# Patient Record
Sex: Female | Born: 1955 | Race: Black or African American | Hispanic: No | Marital: Single | Smoking: Never smoker
Health system: Southern US, Community
[De-identification: ages and names within clinical notes are randomized; demographics above are authoritative.]

## PROBLEM LIST (undated history)

## (undated) DIAGNOSIS — C50919 Malignant neoplasm of unspecified site of unspecified female breast: Secondary | ICD-10-CM

## (undated) DIAGNOSIS — T391X1A Poisoning by 4-Aminophenol derivatives, accidental (unintentional), initial encounter: Secondary | ICD-10-CM

## (undated) DIAGNOSIS — J329 Chronic sinusitis, unspecified: Secondary | ICD-10-CM

## (undated) HISTORY — PX: BIOPSY BREAST: PRO8

---

## 2005-01-18 ENCOUNTER — Inpatient Hospital Stay (HOSPITAL_COMMUNITY): Admission: AD | Admit: 2005-01-18 | Discharge: 2005-01-18 | Payer: Self-pay | Admitting: *Deleted

## 2007-04-14 ENCOUNTER — Encounter: Payer: Self-pay | Admitting: Endocrinology

## 2007-05-31 ENCOUNTER — Ambulatory Visit: Payer: Self-pay | Admitting: Endocrinology

## 2007-05-31 DIAGNOSIS — R519 Headache, unspecified: Secondary | ICD-10-CM | POA: Insufficient documentation

## 2007-05-31 DIAGNOSIS — F329 Major depressive disorder, single episode, unspecified: Secondary | ICD-10-CM

## 2007-05-31 DIAGNOSIS — N951 Menopausal and female climacteric states: Secondary | ICD-10-CM | POA: Insufficient documentation

## 2007-05-31 DIAGNOSIS — K219 Gastro-esophageal reflux disease without esophagitis: Secondary | ICD-10-CM

## 2007-05-31 DIAGNOSIS — E041 Nontoxic single thyroid nodule: Secondary | ICD-10-CM | POA: Insufficient documentation

## 2007-05-31 DIAGNOSIS — R51 Headache: Secondary | ICD-10-CM | POA: Insufficient documentation

## 2007-06-02 ENCOUNTER — Encounter (INDEPENDENT_AMBULATORY_CARE_PROVIDER_SITE_OTHER): Payer: Self-pay | Admitting: *Deleted

## 2015-04-23 ENCOUNTER — Other Ambulatory Visit: Payer: Self-pay

## 2015-04-23 DIAGNOSIS — Z1231 Encounter for screening mammogram for malignant neoplasm of breast: Secondary | ICD-10-CM

## 2015-09-17 ENCOUNTER — Ambulatory Visit
Admission: RE | Admit: 2015-09-17 | Discharge: 2015-09-17 | Disposition: A | Discharge status: Home / Self Care | Payer: No Typology Code available for payment source | Source: Ambulatory Visit

## 2015-09-17 DIAGNOSIS — Z1231 Encounter for screening mammogram for malignant neoplasm of breast: Secondary | ICD-10-CM

## 2015-09-18 ENCOUNTER — Other Ambulatory Visit: Payer: Self-pay | Admitting: Family Medicine

## 2015-09-18 DIAGNOSIS — R928 Other abnormal and inconclusive findings on diagnostic imaging of breast: Secondary | ICD-10-CM

## 2015-09-26 ENCOUNTER — Other Ambulatory Visit: Payer: Self-pay | Admitting: Family Medicine

## 2015-09-26 ENCOUNTER — Ambulatory Visit
Admission: RE | Admit: 2015-09-26 | Discharge: 2015-09-26 | Disposition: A | Discharge status: Home / Self Care | Payer: Medicaid Other | Source: Ambulatory Visit | Attending: Family Medicine | Admitting: Family Medicine

## 2015-09-26 DIAGNOSIS — R928 Other abnormal and inconclusive findings on diagnostic imaging of breast: Secondary | ICD-10-CM

## 2015-09-26 DIAGNOSIS — N6320 Unspecified lump in the left breast, unspecified quadrant: Secondary | ICD-10-CM

## 2015-09-26 DIAGNOSIS — R921 Mammographic calcification found on diagnostic imaging of breast: Secondary | ICD-10-CM

## 2015-10-01 ENCOUNTER — Other Ambulatory Visit: Payer: Self-pay | Admitting: Family Medicine

## 2015-10-01 DIAGNOSIS — R921 Mammographic calcification found on diagnostic imaging of breast: Secondary | ICD-10-CM

## 2015-10-01 DIAGNOSIS — N6320 Unspecified lump in the left breast, unspecified quadrant: Secondary | ICD-10-CM

## 2015-10-02 ENCOUNTER — Inpatient Hospital Stay: Admission: RE | Admit: 2015-10-02 | Payer: Medicaid Other | Source: Ambulatory Visit

## 2015-10-04 ENCOUNTER — Ambulatory Visit
Admission: RE | Admit: 2015-10-04 | Discharge: 2015-10-04 | Disposition: A | Discharge status: Home / Self Care | Payer: Medicaid Other | Source: Ambulatory Visit | Attending: Family Medicine | Admitting: Family Medicine

## 2015-10-04 DIAGNOSIS — R921 Mammographic calcification found on diagnostic imaging of breast: Secondary | ICD-10-CM

## 2015-10-04 DIAGNOSIS — N6320 Unspecified lump in the left breast, unspecified quadrant: Secondary | ICD-10-CM

## 2015-10-14 ENCOUNTER — Ambulatory Visit: Payer: Self-pay | Admitting: Surgery

## 2015-10-16 ENCOUNTER — Encounter: Payer: Self-pay | Admitting: Hematology and Oncology

## 2015-10-16 ENCOUNTER — Telehealth: Payer: Self-pay | Admitting: Hematology and Oncology

## 2015-10-16 NOTE — Telephone Encounter (Signed)
Stat referral received from [place]. Scheduled new patient appointment with [name] for 5/30. Left message for patient - awaiting a return call to confirm.

## 2015-10-17 ENCOUNTER — Telehealth: Payer: Self-pay | Admitting: *Deleted

## 2015-10-17 NOTE — Telephone Encounter (Signed)
Received appt date/time from Audie Clear.  Mailed new pt packet to pt.

## 2015-10-22 ENCOUNTER — Ambulatory Visit: Payer: Medicaid Other | Admitting: Hematology and Oncology

## 2015-10-22 DIAGNOSIS — C50412 Malignant neoplasm of upper-outer quadrant of left female breast: Secondary | ICD-10-CM | POA: Insufficient documentation

## 2015-10-23 ENCOUNTER — Other Ambulatory Visit: Payer: Self-pay

## 2015-10-23 NOTE — Progress Notes (Signed)
Called to follow up regarding missed appointment with [name] yesterday (10/22/15).  Pt very apprehensive and with several questions.  Questions and concerns addressed to the best of my ability.  Pt in agreement to come in "in next 2 weeks." Pt agrees to come in on 11/04/15 at 3:45pm.  I instructed pt our office would contact her to confirm this appointment.  I also notified pt to call us with any questions or concerns in the mean time.  Pt without further questions at time of call.

## 2015-10-24 ENCOUNTER — Telehealth: Payer: Self-pay | Admitting: Hematology and Oncology

## 2015-10-24 ENCOUNTER — Telehealth: Payer: Self-pay | Admitting: *Deleted

## 2015-10-24 NOTE — Telephone Encounter (Signed)
Spoke with patient to confirm 6/12 appt at 330 pm

## 2015-10-24 NOTE — Telephone Encounter (Signed)
Left message for a return phone call.

## 2015-11-04 ENCOUNTER — Ambulatory Visit: Payer: Medicaid Other | Admitting: Hematology and Oncology

## 2015-11-05 ENCOUNTER — Telehealth: Payer: Self-pay | Admitting: *Deleted

## 2015-11-05 NOTE — Telephone Encounter (Signed)
Called pt to r/s missed new pt with [name]. Pt relate she wants to "cancel until further notice". Upon questioning the pt she relayed she is receiving treatment "somewhere else"and receiving treat. Pt informed "I can't tell you that" when asked about type of treatment. Discussed with pt that our concern is that she is being taken care of. Gave pt contact information to call with questions and when she is ready to see medical oncology. Denies further needs at this time.

## 2016-05-06 ENCOUNTER — Emergency Department (HOSPITAL_COMMUNITY): Payer: Medicaid Other

## 2016-05-06 ENCOUNTER — Other Ambulatory Visit: Payer: Self-pay | Admitting: Oncology

## 2016-05-06 ENCOUNTER — Emergency Department (HOSPITAL_COMMUNITY)
Admission: EM | Admit: 2016-05-06 | Discharge: 2016-05-06 | Disposition: A | Discharge status: Home / Self Care | Payer: Medicaid Other | Attending: Emergency Medicine | Admitting: Emergency Medicine

## 2016-05-06 ENCOUNTER — Encounter: Payer: Self-pay | Admitting: *Deleted

## 2016-05-06 ENCOUNTER — Encounter (HOSPITAL_COMMUNITY): Payer: Self-pay | Admitting: Emergency Medicine

## 2016-05-06 DIAGNOSIS — C761 Malignant neoplasm of thorax: Secondary | ICD-10-CM | POA: Diagnosis not present

## 2016-05-06 DIAGNOSIS — Z853 Personal history of malignant neoplasm of breast: Secondary | ICD-10-CM | POA: Insufficient documentation

## 2016-05-06 DIAGNOSIS — M79622 Pain in left upper arm: Secondary | ICD-10-CM | POA: Diagnosis present

## 2016-05-06 DIAGNOSIS — R918 Other nonspecific abnormal finding of lung field: Secondary | ICD-10-CM | POA: Diagnosis not present

## 2016-05-06 HISTORY — DX: Chronic sinusitis, unspecified: J32.9

## 2016-05-06 LAB — I-STAT CHEM 8, ED
BUN: 19 mg/dL (ref 6–20)
CREATININE: 0.9 mg/dL (ref 0.44–1.00)
Calcium, Ion: 1.23 mmol/L (ref 1.15–1.40)
Chloride: 103 mmol/L (ref 101–111)
Glucose, Bld: 112 mg/dL — ABNORMAL HIGH (ref 65–99)
HEMATOCRIT: 38 % (ref 36.0–46.0)
HEMOGLOBIN: 12.9 g/dL (ref 12.0–15.0)
POTASSIUM: 3.9 mmol/L (ref 3.5–5.1)
SODIUM: 140 mmol/L (ref 135–145)
TCO2: 29 mmol/L (ref 0–100)

## 2016-05-06 LAB — CBC WITH DIFFERENTIAL/PLATELET
BASOS ABS: 0 10*3/uL (ref 0.0–0.1)
BASOS PCT: 0 %
EOS ABS: 0.1 10*3/uL (ref 0.0–0.7)
Eosinophils Relative: 2 %
HEMATOCRIT: 37.1 % (ref 36.0–46.0)
HEMOGLOBIN: 11.9 g/dL — AB (ref 12.0–15.0)
Lymphocytes Relative: 20 %
Lymphs Abs: 1.8 10*3/uL (ref 0.7–4.0)
MCH: 27.4 pg (ref 26.0–34.0)
MCHC: 32.1 g/dL (ref 30.0–36.0)
MCV: 85.5 fL (ref 78.0–100.0)
Monocytes Absolute: 0.6 10*3/uL (ref 0.1–1.0)
Monocytes Relative: 7 %
NEUTROS ABS: 6.4 10*3/uL (ref 1.7–7.7)
NEUTROS PCT: 71 %
Platelets: 374 10*3/uL (ref 150–400)
RBC: 4.34 MIL/uL (ref 3.87–5.11)
RDW: 13.7 % (ref 11.5–15.5)
WBC: 9 10*3/uL (ref 4.0–10.5)

## 2016-05-06 MED ORDER — IOPAMIDOL (ISOVUE-300) INJECTION 61%
INTRAVENOUS | Status: AC
  Filled 2016-05-06: qty 75

## 2016-05-06 MED ORDER — IOPAMIDOL (ISOVUE-300) INJECTION 61%
75.0000 mL | Freq: Once | INTRAVENOUS | Status: AC | PRN
  Administered 2016-05-06: 75 mL via INTRAVENOUS

## 2016-05-06 MED ORDER — SODIUM CHLORIDE 0.9 % IJ SOLN
INTRAMUSCULAR | Status: AC
  Filled 2016-05-06: qty 50

## 2016-05-06 NOTE — Discharge Instructions (Signed)
It is VERY important that you can the surgeon and the Oncologist to set appointment for evaluation TODAY

## 2016-05-06 NOTE — Progress Notes (Signed)
[place] Clinical Social Work  Holiday representative was referred by Therapist, sports for transportation and psychosocial needs.  CSW met with patient in the exam room at [facility] to offer support and assess for needs.  Patient identified transportation as her primary concern.  CSW and patient discussed SCAT and reviewed the SCAT application.  CSW and patient also discussed Medicaid transportation.  Patient was familiar with Medicaid transportation and stated she has used it in the past.  Patient plans to call Medicaid transportation today.  Patient was agreeable to completing SCAT application.  CSW will complete and submit application to SCAT.  CSW will follow up with patient regarding transportation with SCAT to her appointment on Friday.  CSW provided contact information and encouraged patient to call with additonal questions or concerns.      Johnnye Lana, MSW, LCSW, OSW-C Clinical Social Worker [facility] [phone]

## 2016-05-06 NOTE — ED Triage Notes (Signed)
Pt c/o left arm pain that pt describes as burning in nature; states she had this pain over the summer; pt didn't seek evaluation then because the pain would be in different places then but now is her entire arm; no limitations to range of motion; PMS intact

## 2016-05-06 NOTE — ED Provider Notes (Signed)
[facility] DEPT Provider Note   CSN: [id number] Arrival date & time: 05/06/16  0100  By signing my name below, I, Gwenlyn Fudge, attest that this documentation has been prepared under the direction and in the presence of Junius Creamer, NP. Electronically Signed: Gwenlyn Fudge, ED Scribe. 05/06/16. 1:34 AM.  History   Chief Complaint Chief Complaint  Patient presents with  . Arm Pain   The history is provided by the patient. No language interpreter was used.   HPI Comments: Ann Oliver is a 60 y.o. female who presents to the Emergency Department complaining of gradual onset, constant, moderate burning left axilla pain onset a couple months, but worsening tonight. She states pain radiates into her left shoulder blade. Pt states she has had a mass to her left axilla for a month.  Past Medical History:  Diagnosis Date  . Sinusitis   . Tumor cells     Patient Active Problem List   Diagnosis Date Noted  . Breast cancer of upper-outer quadrant of left female breast ([place]) 10/22/2015  . THYROID NODULE, RIGHT 05/31/2007  . DEPRESSION 05/31/2007  . GERD 05/31/2007  . HOT FLASHES 05/31/2007  . HEADACHE 05/31/2007    Past Surgical History:  Procedure Laterality Date  . BIOPSY BREAST      OB History    No data available       Home Medications    Prior to Admission medications   Not on File    Family History No family history on file.  Social History Social History  Substance Use Topics  . Smoking status: Never Smoker  . Smokeless tobacco: Never Used  . Alcohol use No     Allergies   Patient has no allergy information on record.   Review of Systems Review of Systems  Constitutional: Negative for fever.  Musculoskeletal: Positive for arthralgias.  Skin: Positive for color change.   Physical Exam Updated Vital Signs BP 178/84 (BP Location: Right Arm)   Pulse 96   Temp 98.4 F (36.9 C) (Oral)   Resp 18   SpO2 100%   Physical Exam  Constitutional:  She is oriented to person, place, and time. She appears well-developed and well-nourished. She is active. No distress.  HENT:  Head: Normocephalic and atraumatic.  Eyes: Conjunctivae are normal.  Cardiovascular: Normal rate.   Pulmonary/Chest: Effort normal. No respiratory distress.  Musculoskeletal: Normal range of motion.  Neurological: She is alert and oriented to person, place, and time.  Skin: Skin is warm and dry.  Multi nodule mass each measuring approximately 5 cm with the superior mass as a fluctuant 3 cm round area with surrounding erythema extending to 10 cm  Psychiatric: She has a normal mood and affect. Her behavior is normal.  Nursing note and vitals reviewed.  ED Treatments / Results  DIAGNOSTIC STUDIES: Oxygen Saturation is 100% on RA, normal by my interpretation.    COORDINATION OF CARE: 1:24 AM Discussed treatment plan with pt at bedside which includes CT Scan and labs and pt agreed to plan.  Labs (all labs ordered are listed, but only abnormal results are displayed) Labs Reviewed  CBC WITH DIFFERENTIAL/PLATELET - Abnormal; Notable for the following:       Result Value   Hemoglobin 11.9 (*)    All other components within normal limits  I-STAT CHEM 8, ED - Abnormal; Notable for the following:    Glucose, Bld 112 (*)    All other components within normal limits    EKG  EKG  Interpretation None       Radiology Ct Chest W Contrast  Result Date: 05/06/2016 CLINICAL DATA:  60 year old female with palpable left axillary mass with burning and radiating pain to the shoulder blades. History of left breast biopsy EXAM: CT CHEST WITH CONTRAST TECHNIQUE: Multidetector CT imaging of the chest was performed during intravenous contrast administration. CONTRAST:  55mL ISOVUE-300 IOPAMIDOL (ISOVUE-300) INJECTION 61% COMPARISON:  None. FINDINGS: Cardiovascular: There is no cardiomegaly or pericardial effusion. The thoracic aorta appears unremarkable. The visualized origins of  the great vessels of the aortic arch appear patent. The visualized vertebral arteries appear hypoplastic. Evaluation of the pulmonary arteries is limited due to suboptimal opacification and suboptimal visualization of the peripheral branches. No definite central pulmonary artery embolus identified. Mediastinum/Nodes: There is no hilar or mediastinal adenopathy. The esophagus is grossly unremarkable. There is a 1.5 cm right thyroid hypodense nodule. Ultrasound is recommended for further evaluation. Lungs/Pleura: Multiple (more than 15) bilateral pulmonary nodules measuring up to 8 mm in the left lower lobe most compatible with metastatic disease. There is no focal consolidation, pleural effusion, or pneumothorax. The central airways are patent. Upper Abdomen: Partially visualized 1.5 x 1.5 cm hypodense lesion in the right lobe of the liver (series 2, image 124) concerning for metastatic disease. Further evaluation with dedicated CT of the abdomen and pelvis or MRI without and with contrast is recommended. Scattered subcentimeter hepatic hypodensities are not characterized on the CT. The visualized upper abdomen is otherwise unremarkable. Musculoskeletal: There is a 5.1 x 5.0 x 6.2 cm heterogeneous predominantly solid mass in the subcutaneous soft tissues of the left lateral chest wall in the upper outer quadrant of the left breast extending into the left axilla. This mass abuts the lateral aspect of the left pectoralis major muscle and extends to the level of the skin. Areas of lower attenuation within this mass most compatible with necrotic tissue. Multiple enlarged and centrally necrotic left axillary lymph nodes noted. There is mild diffuse subcutaneous stranding. No drainable fluid collection or abscess. No acute osseous pathology. No suspicious bone lesions identified. IMPRESSION: Heterogeneous partially necrotic solid mass in the left lateral chest wall and upper outer quadrant of the left breast extending from  level of the pectoralis major to the skin most compatible with malignancy. Multiple enlarged and necrotic left axillary lymph node noted. Multiple pulmonary nodules measuring up to 8 mm compatible with metastatic disease. Partially visualized right hepatic hypodense lesion suspicious for metastatic disease. Further evaluation with dedicated CT of the abdomen pelvis or MRI without and with contrast is recommended. Right thyroid hypodense nodule. Further evaluation with ultrasound is recommended. Electronically Signed   By: Anner Crete M.D.   On: 05/06/2016 05:05    Procedures Procedures (including critical care time)  Medications Ordered in ED Medications  iopamidol (ISOVUE-300) 61 % injection (not administered)  sodium chloride 0.9 % injection (not administered)  iopamidol (ISOVUE-300) 61 % injection 75 mL (75 mLs Intravenous Contrast Given 05/06/16 0434)     Initial Impression / Assessment and Plan / ED Course  I have reviewed the triage vital signs and the nursing notes.  Pertinent labs & imaging results that were available during my care of the patient were reviewed by me and considered in my medical decision making (see chart for details).  Clinical Course   I personally performed the services described in this documentation, which was scribed in my presence. The recorded information has been reviewed and is accurate.  Search of the patient's chart shows  that in May 2017.  She was diagnosed with adenocarcinoma of the left breast.  She missed several follow-up appointments with surgery and oncology. I discussed the importance of the patient following up with [facility] surgery and oncology.  Due to the advancing state of her cancer, which is now involving nodules in the lung and liver plus the area around the chest wall is showing some necrosis.  Final Clinical Impressions(s) / ED Diagnoses   Final diagnoses:  Cancer of chest (wall) ([place])  Multiple lung nodules on CT     New Prescriptions New Prescriptions   No medications on file     Junius Creamer, NP 05/06/16 [place], MD 05/06/16 [phone]

## 2016-05-07 ENCOUNTER — Encounter: Payer: Self-pay | Admitting: *Deleted

## 2016-05-07 NOTE — Progress Notes (Signed)
[facility] Work  Clinical Social Work completed SCAT application with pt and submitted to SCAT. Pt is approved for SCAT services. CSW set up SCAT transportation for pt's appt tomorrow. CSW contacted pt and confirmed her plans for SCAT. CSW provided SCAT number to pt for future use. Pt very appreciative and agrees to reach out as needed.    Clinical Social Work interventions:  Resource assistance and referral Loren Racer, [place] Worker [place]  [place] Phone: [phone] Fax: [phone]

## 2016-05-08 ENCOUNTER — Ambulatory Visit (HOSPITAL_BASED_OUTPATIENT_CLINIC_OR_DEPARTMENT_OTHER): Payer: Medicaid Other | Admitting: Hematology and Oncology

## 2016-05-08 ENCOUNTER — Other Ambulatory Visit: Payer: Self-pay | Admitting: *Deleted

## 2016-05-08 ENCOUNTER — Encounter: Payer: Self-pay | Admitting: Hematology and Oncology

## 2016-05-08 DIAGNOSIS — C50412 Malignant neoplasm of upper-outer quadrant of left female breast: Secondary | ICD-10-CM | POA: Diagnosis present

## 2016-05-08 DIAGNOSIS — Z170 Estrogen receptor positive status [ER+]: Secondary | ICD-10-CM | POA: Diagnosis not present

## 2016-05-08 MED ORDER — LETROZOLE 2.5 MG PO TABS: 2.5000 mg | ORAL_TABLET | Freq: Every day | ORAL | 3 refills | Status: DC

## 2016-05-08 NOTE — Progress Notes (Signed)
[facility] CONSULT NOTE  No care team member to display  CHIEF COMPLAINTS/PURPOSE OF CONSULTATION:  Advanced breast cancer   HISTORY OF PRESENTING ILLNESS:  Ann Oliver 60 y.o. female is here because of recent diagnosis of metastatic breast cancer. Patient had a mammogram in May 2017 which revealed a 3.1 cm mass in the left breast. This was biopsy-proven to be invasive adenocarcinoma grade 2-3 with a Ki-67 30% that was ER positive PR negative. Patient decided not to do anything with the cancer and was lost to follow-up. Recently she noticed that the mass has dramatically increased in size especially in the left axilla. It was protruding from the skin causing her inconvenience. She went to the emergency room who performed a CT of the chest and detected metastatic disease to the lungs and liver. She was sent to [place] for discussion regarding treatment options. Patient feels remarkably well. She does not have any pain or discomfort in the breasts. She denies any fevers or chills. She denies any loss of appetite or weight. Denies any bone pain. She does have intermittent headaches. He thinks that they're migraines. She lives with her son but does not want to tell him any information. She emphasized that we're not allowed to give out any information to her family.  I reviewed her records extensively and collaborated the history with the patient.  SUMMARY OF ONCOLOGIC HISTORY:   Breast cancer of upper-outer quadrant of left female breast ([place])   09/26/2015 Mammogram    Left breast 2:00 position suspicious mass 3.1 x 1.8 x 2.8 cm, indeterminate group of calcifications UIQ left breast needs stereotactic biopsy      10/22/2015 Initial Diagnosis    Left breast biopsy UOQ: Invasive adenocarcinoma, grade 2-3, EF 20%, PR 0%, Ki-67 30%, left breast UIQ: Fibroadenoma with extensive calcifications       Miscellaneous    Patient decided not to get any interventions and was lost to follow-up       MEDICAL HISTORY:  Past Medical History:  Diagnosis Date  . Sinusitis   . Tumor cells     SURGICAL HISTORY: Past Surgical History:  Procedure Laterality Date  . BIOPSY BREAST      SOCIAL HISTORY: Social History   Social History  . Marital status: Single    Spouse name: N/A  . Number of children: N/A  . Years of education: N/A   Occupational History  . Not on file.   Social History Main Topics  . Smoking status: Never Smoker  . Smokeless tobacco: Never Used  . Alcohol use No  . Drug use: No  . Sexual activity: Not on file   Other Topics Concern  . Not on file   Social History Narrative  . No narrative on file    FAMILY HISTORY: Denies any family history of breast cancer  ALLERGIES:  is allergic to aspirin.  MEDICATIONS:  No current outpatient prescriptions on file.   No current facility-administered medications for this visit.     REVIEW OF SYSTEMS:   Constitutional: Denies fevers, chills or abnormal night sweats Eyes: Denies blurriness of vision, double vision or watery eyes Ears, nose, mouth, throat, and face: Denies mucositis or sore throat Respiratory: Denies cough, dyspnea or wheezes Cardiovascular: Denies palpitation, chest discomfort or lower extremity swelling Gastrointestinal:  Denies nausea, heartburn or change in bowel habits Skin: Denies abnormal skin rashes Lymphatics: Denies new lymphadenopathy or easy bruising Neurological:Denies numbness, tingling or new weaknesses Behavioral/Psych: Mood is stable, no new  changes  Breast: Large palpable mass in the left breast and axilla All other systems were reviewed with the patient and are negative.  PHYSICAL EXAMINATION: ECOG PERFORMANCE STATUS: 1 - Symptomatic but completely ambulatory  Vitals:   05/08/16 1218  BP: (!) 138/59  Pulse: (!) 102  Resp: 18  Temp: 98.6 F (37 C)   Filed Weights   05/08/16 1218  Weight: 127 lb 9.6 oz (57.9 kg)    GENERAL:alert, no distress and  comfortable SKIN: skin color, texture, turgor are normal, no rashes or significant lesions EYES: normal, conjunctiva are pink and non-injected, sclera clear OROPHARYNX:no exudate, no erythema and lips, buccal mucosa, and tongue normal  NECK: supple, thyroid normal size, non-tender, without nodularity LYMPH:  no palpable lymphadenopathy in the cervical, axillary or inguinal LUNGS: clear to auscultation and percussion with normal breathing effort HEART: regular rate & rhythm and no murmurs and no lower extremity edema ABDOMEN:abdomen soft, non-tender and normal bowel sounds Musculoskeletal:no cyanosis of digits and no clubbing  PSYCH: alert & oriented x 3 with fluent speech NEURO: no focal motor/sensory deficits BREAST:Large left breast and axillary mass measuring at least 8 x 7 cm. This mass is extending into the skin and firmly attached to the pectoral muscle. (exam performed in the presence of a chaperone)   LABORATORY DATA:  I have reviewed the data as listed Lab Results  Component Value Date   WBC 9.0 05/06/2016   HGB 12.9 05/06/2016   HCT 38.0 05/06/2016   MCV 85.5 05/06/2016   PLT 374 05/06/2016   Lab Results  Component Value Date   NA 140 05/06/2016   K 3.9 05/06/2016   CL 103 05/06/2016    RADIOGRAPHIC STUDIES: I have personally reviewed the radiological reports and agreed with the findings in the report.  ASSESSMENT AND PLAN:  Breast cancer of upper-outer quadrant of left female breast ([place]) 09/26/2015 Left breast 2:00 position suspicious mass 3.1 x 1.8 x 2.8 cm, indeterminate group of calcifications UIQ left breast needs stereotactic biopsy Left breast biopsy UOQ 10/22/2015: Invasive adenocarcinoma, grade 2-3, EF 20%, PR 0%, Ki-67 30%, left breast UIQ: Fibroadenoma with extensive calcifications Clinical stage in May 2017: T2 N0 stage II a Clinical stage 05/08/2016: Stage IV  Pathology and radiology counseling:Discussed with the patient, the details of pathology  including the type of breast cancer,the clinical staging, the significance of ER, PR and HER-2/neu receptors and the implications for treatment. After reviewing the pathology in detail, we proceeded to discuss the different treatment options.  Unfortunately patient delayed her surgical intervention.  Recommendations: 1. Biopsy of the mass 2. PET/CT scan 3. MRI brain 4. Start antiestrogen therapy with letrozole 2.5 mg by mouth daily based on the fact the previous biopsy was estrogen receptor positive.  Return to clinic in one month to discuss the biopsy results and scan results and follow-up. I discussed with the patient that she needs to discuss her diagnosis with her family. She did not seem to be interested in discussing it with her son.  All questions were answered. The patient knows to call the clinic with any problems, questions or concerns.    Rulon Eisenmenger, MD 05/08/16

## 2016-05-08 NOTE — Assessment & Plan Note (Signed)
09/26/2015 Left breast 2:00 position suspicious mass 3.1 x 1.8 x 2.8 cm, indeterminate group of calcifications UIQ left breast needs stereotactic biopsy  Left breast biopsy UOQ 10/22/2015: Invasive adenocarcinoma, grade 2-3, EF 20%, PR 0%, Ki-67 30%, left breast UIQ: Fibroadenoma with extensive calcifications  Clinical stage in May 2017: T2 N0 stage II a  Pathology and radiology counseling:Discussed with the patient, the details of pathology including the type of breast cancer,the clinical staging, the significance of ER, PR and HER-2/neu receptors and the implications for treatment. After reviewing the pathology in detail, we proceeded to discuss the different treatment options between surgery, radiation, chemotherapy, antiestrogen therapies.  Unfortunately patient delayed her surgical intervention.  Recommendations: 1. Breast conserving surgery followed by 2. Oncotype DX testing to determine if chemotherapy would be of any benefit followed by 3. Adjuvant radiation therapy followed by 4. Adjuvant antiestrogen therapy  Oncotype counseling: I discussed Oncotype DX test. I explained to the patient that this is a 21 gene panel to evaluate patient tumors DNA to calculate recurrence score. This would help determine whether patient has high risk or intermediate risk or low risk breast cancer. She understands that if her tumor was found to be high risk, she would benefit from systemic chemotherapy. If low risk, no need of chemotherapy. If she was found to be intermediate risk, we would need to evaluate the score as well as other risk factors and determine if an abbreviated chemotherapy may be of benefit.  Return to clinic after surgery to discuss final pathology report

## 2016-05-11 ENCOUNTER — Other Ambulatory Visit: Payer: Self-pay | Admitting: Emergency Medicine

## 2016-05-11 DIAGNOSIS — C50412 Malignant neoplasm of upper-outer quadrant of left female breast: Secondary | ICD-10-CM

## 2016-05-11 DIAGNOSIS — Z170 Estrogen receptor positive status [ER+]: Secondary | ICD-10-CM

## 2016-05-11 DIAGNOSIS — R519 Headache, unspecified: Secondary | ICD-10-CM

## 2016-05-11 DIAGNOSIS — R51 Headache: Principal | ICD-10-CM

## 2016-05-12 ENCOUNTER — Telehealth: Payer: Self-pay | Admitting: *Deleted

## 2016-05-12 NOTE — Telephone Encounter (Signed)
"  Patient has called to schedule the MRI.  The order for MRI Brain with and without contrast needs a diagnosis code related to the brain in order to be performed."  This nurse added diagnosis code R51  As listed in problem list.

## 2016-05-19 ENCOUNTER — Other Ambulatory Visit: Payer: Self-pay | Admitting: *Deleted

## 2016-05-21 ENCOUNTER — Inpatient Hospital Stay: Admission: RE | Admit: 2016-05-21 | Payer: Medicaid Other | Source: Ambulatory Visit

## 2016-05-26 ENCOUNTER — Encounter (HOSPITAL_COMMUNITY): Payer: Medicaid Other

## 2016-05-27 ENCOUNTER — Other Ambulatory Visit: Payer: Self-pay | Admitting: *Deleted

## 2016-05-27 ENCOUNTER — Telehealth: Payer: Self-pay | Admitting: *Deleted

## 2016-05-27 NOTE — Telephone Encounter (Signed)
This RN contacted [employer] to give additional data per case # [id number] for PET scan order submitted 05/26/2016.  Per RN to RN contact - informed PET scan request is for initial staging of new breast cancer with abnormal CT of chest.  Staging is for treatment decisions including surgery / and or neoadjuvant chemotherapy.  Above data submitted and informed decision will given by 5 pm 05/28/2016.  This RN inquired if PET scan denied need to know what other studies for staging are approved for this patient.  Request per above placed and informed again decision will be made by 5 pm 05/28/2016

## 2016-06-08 ENCOUNTER — Telehealth: Payer: Self-pay

## 2016-06-08 NOTE — Telephone Encounter (Signed)
Pt returned call. She [facility] know [facility] b/c [facility] recently moved. Her pill bottles at home [facility] them. I requested she [facility] out [facility] and to call us back tomorrow with this information.

## 2016-06-08 NOTE — Telephone Encounter (Signed)
Called pt lvm to verify preferred pharmacy to send letrozole medication to. Left call back number for pt.

## 2016-06-09 ENCOUNTER — Other Ambulatory Visit: Payer: Self-pay

## 2016-06-09 MED ORDER — LETROZOLE 2.5 MG PO TABS: 2.5000 mg | ORAL_TABLET | Freq: Every day | ORAL | 3 refills | Status: DC

## 2016-06-09 NOTE — Telephone Encounter (Signed)
Pt called in with correct and closest pharmacy to her. Updated epic with correct pharmacy and will send over medication to [facility].

## 2016-06-10 ENCOUNTER — Encounter (HOSPITAL_COMMUNITY): Payer: Medicaid Other

## 2016-06-12 ENCOUNTER — Ambulatory Visit: Payer: Medicaid Other | Admitting: Hematology and Oncology

## 2016-06-28 ENCOUNTER — Emergency Department (HOSPITAL_COMMUNITY)
Admission: EM | Admit: 2016-06-28 | Discharge: 2016-06-28 | Discharge status: Against Medical Advice | Payer: Medicaid Other | Attending: Emergency Medicine | Admitting: Emergency Medicine

## 2016-06-28 ENCOUNTER — Encounter (HOSPITAL_COMMUNITY): Payer: Self-pay | Admitting: Nurse Practitioner

## 2016-06-28 DIAGNOSIS — R000 Tachycardia, unspecified: Secondary | ICD-10-CM | POA: Diagnosis not present

## 2016-06-28 DIAGNOSIS — Z79899 Other long term (current) drug therapy: Secondary | ICD-10-CM | POA: Diagnosis not present

## 2016-06-28 DIAGNOSIS — C50412 Malignant neoplasm of upper-outer quadrant of left female breast: Secondary | ICD-10-CM | POA: Insufficient documentation

## 2016-06-28 DIAGNOSIS — C50919 Malignant neoplasm of unspecified site of unspecified female breast: Secondary | ICD-10-CM

## 2016-06-28 NOTE — ED Triage Notes (Signed)
Pt brought in via EMS: Per EMS pt was standing out in driveway waiting on their arrival. States she has a cyst or swollen area in her left axilla. Per EMS she stated it has been there a little over a week. Site is tender to touch, draining clear colored, and is non-odorous. She denies any changes in the area over the past week. VSS. 140/90, HR 100, 14 Resp, 99% on RA. Patient verbalizes pain and tenderness to the area and is guarding.

## 2016-06-28 NOTE — ED Notes (Addendum)
Patient has large open tumor to left breast axilla area. States " I took a shower and it started draining clear stuff on Tuesday of this week.It has been getting bigger over the past 3 weeks or so." When asking patient has she seen the doctor to confirm any sort of diagnosis she stated "oh yeah but I dont have to follow up because they told me it was my lymph node swollen and it would go away or eventually pop." Patient will not maintain eye contact. Area under left axilla and breast area is grossly enlarged, tender to touch, when pressed upon serous fluid drains in small amount. She is very guarded of the area. No warmth noted. Patient continues to deny pain. Pt continues to deny she has a cancer diagnosis despite archived files from seeing medical oncology here at [facility] in Dec/January.

## 2016-06-28 NOTE — ED Provider Notes (Signed)
[facility] DEPT Provider Note   CSN: [id number] Arrival date & time: 06/28/16  0417    History   Chief Complaint Chief Complaint  Patient presents with  . Breast Cancer    HPI Sofya Littrel is a 61 y.o. female.  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 She has continued to be lost to follow up since initial diagnosis in May 2017. She did see [name] in December 2017, but has not followed up since. She presents to the emergency department for evaluation of clear drainage from a mass to the left side of her chest at the site of her known cancer. Patient states "I took a shower and it started draining clear stuff on Tuesday of this week. It has been getting bigger over the past 3 weeks or so." Patient is very apprehensive about her visit today. She does seem to understand that this area corresponds to a lymph node; however believes it is supposed to "go away or eventually pop". No medications taken PTA for symptoms. No fevers, syncope, SOB, or fatigue. She reports normal appetite.      Past Medical History:  Diagnosis Date  . Sinusitis   . Tumor cells     Patient Active Problem List   Diagnosis Date Noted  . Breast cancer of upper-outer quadrant of left female breast ([place]) 10/22/2015  . THYROID NODULE, RIGHT 05/31/2007  . DEPRESSION 05/31/2007  . GERD 05/31/2007  . HOT FLASHES 05/31/2007  . HEADACHE 05/31/2007    Past Surgical History:  Procedure Laterality Date  . BIOPSY BREAST      OB History    No data available       Home Medications    Prior to Admission medications   Medication Sig Start Date End Date Taking? Authorizing Provider  letrozole (FEMARA) 2.5 MG tablet Take 1 tablet (2.5 mg total) by mouth daily. 06/09/16  Yes Nicholas Lose, MD    Family History No family history on file.  Social History Social History  Substance Use Topics  . Smoking status: Never Smoker  . Smokeless tobacco:  Never Used  . Alcohol use No     Allergies   Aspirin   Review of Systems Review of Systems Ten systems reviewed and are negative for acute change, except as noted in the HPI.    Physical Exam Updated Vital Signs BP 164/89 (BP Location: Right Arm)   Pulse 98   Temp 98.4 F (36.9 C) (Oral)   Resp 20   Ht 5\' 1"  (1.549 m)   Wt 59 kg   SpO2 100%   BMI 24.56 kg/m   Physical Exam  Constitutional: She is oriented to person, place, and time. She appears well-developed and well-nourished. No distress.  Nontoxic and in NAD  HENT:  Head: Normocephalic and atraumatic.  Eyes: Conjunctivae and EOM are normal. No scleral icterus.  Neck: Normal range of motion.  Cardiovascular: Regular rhythm and intact distal pulses.   Tachycardia 118-124bpm  Pulmonary/Chest: Effort normal. No respiratory distress. She has no wheezes.  Respirations even and unlabored. Mass noted to left lateral chest wall. Nontender. See image below.  Musculoskeletal: Normal range of motion.  Neurological: She is alert and oriented to person, place, and time.  Skin: Skin is warm and dry. No rash noted. She is not diaphoretic. No erythema. No pallor.  Psychiatric: Her behavior is normal. Her mood appears anxious.  Nursing note and vitals reviewed.    ED  Treatments / Results  Labs (all labs ordered are listed, but only abnormal results are displayed) Labs Reviewed - No data to display  EKG  EKG Interpretation None       Radiology No results found.  Procedures Procedures (including critical care time)  Medications Ordered in ED Medications - No data to display        Initial Impression / Assessment and Plan / ED Course  I have reviewed the triage vital signs and the nursing notes.  Pertinent labs & imaging results that were available during my care of the patient were reviewed by me and considered in my medical decision making (see chart for details).     61 year old female presents to the  emergency department for evaluation of a mass to the left side of her chest wall. Psych corresponds to known area of breast cancer and necrotic lymph nodes. Scant amount of serous drainage appreciated. Area is nontender. No concern for secondary infection or cellulitis. Patient tachycardic during my assessment, though appears visibly anxious. She was not noted to be tachycardic on arrival. I have asked the patient to conduct blood tests which she declines. I have explained to the patient that I am unable to manage the mass to her chest wall and that this would require more extensive surgery by a general surgeon. The patient declines any further care, stating that she will follow-up with her oncologist on an outpatient basis. I have explained to the patient the nature of her cancer with metastases to the liver and lung. She verbalizes understanding, but appears to strongly be in denial regarding her illness. She continues to insist on discharge. Patient discharged from the ED [facility].   Final Clinical Impressions(s) / ED Diagnoses   Final diagnoses:  Metastatic breast cancer [facility])  Tachycardia    New Prescriptions Discharge Medication List as of 06/28/2016  6:17 AM       Antonietta Breach, PA-C 06/28/16 [id number]    Sherwood Gambler, MD 06/28/16 [phone]

## 2016-06-28 NOTE — ED Notes (Signed)
Bed: [id number] Expected date:  Expected time:  Means of arrival:  Comments: 61 yo F/ Swelling to left arm

## 2016-09-24 ENCOUNTER — Encounter (HOSPITAL_COMMUNITY): Payer: Self-pay | Admitting: *Deleted

## 2016-09-24 DIAGNOSIS — Z6823 Body mass index (BMI) 23.0-23.9, adult: Secondary | ICD-10-CM

## 2016-09-24 DIAGNOSIS — K219 Gastro-esophageal reflux disease without esophagitis: Secondary | ICD-10-CM | POA: Diagnosis present

## 2016-09-24 DIAGNOSIS — Z79811 Long term (current) use of aromatase inhibitors: Secondary | ICD-10-CM

## 2016-09-24 DIAGNOSIS — C50412 Malignant neoplasm of upper-outer quadrant of left female breast: Principal | ICD-10-CM | POA: Diagnosis present

## 2016-09-24 DIAGNOSIS — Z853 Personal history of malignant neoplasm of breast: Secondary | ICD-10-CM

## 2016-09-24 DIAGNOSIS — Z170 Estrogen receptor positive status [ER+]: Secondary | ICD-10-CM

## 2016-09-24 DIAGNOSIS — C773 Secondary and unspecified malignant neoplasm of axilla and upper limb lymph nodes: Secondary | ICD-10-CM | POA: Diagnosis present

## 2016-09-24 DIAGNOSIS — Z886 Allergy status to analgesic agent status: Secondary | ICD-10-CM

## 2016-09-24 DIAGNOSIS — C7800 Secondary malignant neoplasm of unspecified lung: Secondary | ICD-10-CM | POA: Diagnosis present

## 2016-09-24 DIAGNOSIS — E440 Moderate protein-calorie malnutrition: Secondary | ICD-10-CM | POA: Diagnosis present

## 2016-09-24 NOTE — ED Triage Notes (Signed)
Pt complains of foul smelling abscess to underarm "for a while". Pt denies pain. Pt states the abscess has started draining.

## 2016-09-25 ENCOUNTER — Inpatient Hospital Stay (HOSPITAL_COMMUNITY)
Admission: EM | Admit: 2016-09-25 | Discharge: 2016-09-27 | DRG: 598 | Disposition: A | Discharge status: Home / Self Care | Payer: Medicaid Other | Attending: Family Medicine | Admitting: Family Medicine

## 2016-09-25 ENCOUNTER — Encounter (HOSPITAL_COMMUNITY): Payer: Self-pay

## 2016-09-25 ENCOUNTER — Emergency Department (HOSPITAL_COMMUNITY): Payer: Medicaid Other

## 2016-09-25 DIAGNOSIS — R2231 Localized swelling, mass and lump, right upper limb: Secondary | ICD-10-CM

## 2016-09-25 DIAGNOSIS — C50919 Malignant neoplasm of unspecified site of unspecified female breast: Secondary | ICD-10-CM | POA: Diagnosis not present

## 2016-09-25 DIAGNOSIS — Z6823 Body mass index (BMI) 23.0-23.9, adult: Secondary | ICD-10-CM | POA: Diagnosis not present

## 2016-09-25 DIAGNOSIS — L089 Local infection of the skin and subcutaneous tissue, unspecified: Secondary | ICD-10-CM | POA: Diagnosis not present

## 2016-09-25 DIAGNOSIS — C7800 Secondary malignant neoplasm of unspecified lung: Secondary | ICD-10-CM

## 2016-09-25 DIAGNOSIS — K219 Gastro-esophageal reflux disease without esophagitis: Secondary | ICD-10-CM | POA: Diagnosis present

## 2016-09-25 DIAGNOSIS — F4320 Adjustment disorder, unspecified: Secondary | ICD-10-CM

## 2016-09-25 DIAGNOSIS — C50412 Malignant neoplasm of upper-outer quadrant of left female breast: Secondary | ICD-10-CM | POA: Diagnosis present

## 2016-09-25 DIAGNOSIS — Z7189 Other specified counseling: Secondary | ICD-10-CM

## 2016-09-25 DIAGNOSIS — Z853 Personal history of malignant neoplasm of breast: Secondary | ICD-10-CM | POA: Diagnosis not present

## 2016-09-25 DIAGNOSIS — Z886 Allergy status to analgesic agent status: Secondary | ICD-10-CM | POA: Diagnosis not present

## 2016-09-25 DIAGNOSIS — E440 Moderate protein-calorie malnutrition: Secondary | ICD-10-CM | POA: Diagnosis present

## 2016-09-25 DIAGNOSIS — C773 Secondary and unspecified malignant neoplasm of axilla and upper limb lymph nodes: Secondary | ICD-10-CM | POA: Diagnosis present

## 2016-09-25 DIAGNOSIS — Z515 Encounter for palliative care: Secondary | ICD-10-CM

## 2016-09-25 DIAGNOSIS — C50911 Malignant neoplasm of unspecified site of right female breast: Secondary | ICD-10-CM | POA: Diagnosis not present

## 2016-09-25 DIAGNOSIS — Z170 Estrogen receptor positive status [ER+]: Secondary | ICD-10-CM | POA: Diagnosis not present

## 2016-09-25 DIAGNOSIS — Z590 Homelessness: Secondary | ICD-10-CM | POA: Diagnosis not present

## 2016-09-25 DIAGNOSIS — N644 Mastodynia: Secondary | ICD-10-CM | POA: Diagnosis not present

## 2016-09-25 DIAGNOSIS — Z79811 Long term (current) use of aromatase inhibitors: Secondary | ICD-10-CM | POA: Diagnosis not present

## 2016-09-25 DIAGNOSIS — R2230 Localized swelling, mass and lump, unspecified upper limb: Secondary | ICD-10-CM

## 2016-09-25 LAB — I-STAT CHEM 8, ED
BUN: 17 mg/dL (ref 6–20)
CALCIUM ION: 1.09 mmol/L — AB (ref 1.15–1.40)
CHLORIDE: 103 mmol/L (ref 101–111)
CREATININE: 0.9 mg/dL (ref 0.44–1.00)
Glucose, Bld: 115 mg/dL — ABNORMAL HIGH (ref 65–99)
HCT: 35 % — ABNORMAL LOW (ref 36.0–46.0)
Hemoglobin: 11.9 g/dL — ABNORMAL LOW (ref 12.0–15.0)
Potassium: 3.6 mmol/L (ref 3.5–5.1)
SODIUM: 138 mmol/L (ref 135–145)
TCO2: 28 mmol/L (ref 0–100)

## 2016-09-25 LAB — COMPREHENSIVE METABOLIC PANEL
ALBUMIN: 4 g/dL (ref 3.5–5.0)
ALK PHOS: 74 U/L (ref 38–126)
ALT: 19 U/L (ref 14–54)
AST: 29 U/L (ref 15–41)
Anion gap: 11 (ref 5–15)
BILIRUBIN TOTAL: 0.3 mg/dL (ref 0.3–1.2)
BUN: 16 mg/dL (ref 6–20)
CALCIUM: 9.7 mg/dL (ref 8.9–10.3)
CO2: 26 mmol/L (ref 22–32)
Chloride: 102 mmol/L (ref 101–111)
Creatinine, Ser: 0.88 mg/dL (ref 0.44–1.00)
GFR calc Af Amer: >60 mL/min (ref >60)
GFR calc non Af Amer: >60 mL/min (ref >60)
GLUCOSE: 118 mg/dL — AB (ref 65–99)
Potassium: 3.4 mmol/L — ABNORMAL LOW (ref 3.5–5.1)
SODIUM: 139 mmol/L (ref 135–145)
TOTAL PROTEIN: 7.9 g/dL (ref 6.5–8.1)

## 2016-09-25 LAB — URINALYSIS, ROUTINE W REFLEX MICROSCOPIC
Bilirubin Urine: NEGATIVE
GLUCOSE, UA: NEGATIVE mg/dL
HGB URINE DIPSTICK: NEGATIVE
KETONES UR: 5 mg/dL — AB
LEUKOCYTES UA: NEGATIVE
Nitrite: NEGATIVE
PH: 5 (ref 5.0–8.0)
PROTEIN: NEGATIVE mg/dL
Specific Gravity, Urine: 1.019 (ref 1.005–1.030)

## 2016-09-25 LAB — CBC WITH DIFFERENTIAL/PLATELET
BASOS ABS: 0 10*3/uL (ref 0.0–0.1)
Basophils Relative: 0 %
Eosinophils Absolute: 0.1 10*3/uL (ref 0.0–0.7)
Eosinophils Relative: 1 %
HEMATOCRIT: 33.9 % — AB (ref 36.0–46.0)
HEMOGLOBIN: 11 g/dL — AB (ref 12.0–15.0)
Lymphocytes Relative: 14 %
Lymphs Abs: 1.4 10*3/uL (ref 0.7–4.0)
MCH: 27.2 pg (ref 26.0–34.0)
MCHC: 32.4 g/dL (ref 30.0–36.0)
MCV: 83.9 fL (ref 78.0–100.0)
MONO ABS: 0.9 10*3/uL (ref 0.1–1.0)
Monocytes Relative: 9 %
NEUTROS ABS: 7.7 10*3/uL (ref 1.7–7.7)
Neutrophils Relative %: 76 %
Platelets: 452 10*3/uL — ABNORMAL HIGH (ref 150–400)
RBC: 4.04 MIL/uL (ref 3.87–5.11)
RDW: 13.3 % (ref 11.5–15.5)
WBC: 10 10*3/uL (ref 4.0–10.5)

## 2016-09-25 LAB — HIV ANTIBODY (ROUTINE TESTING W REFLEX): HIV Screen 4th Generation wRfx: NONREACTIVE

## 2016-09-25 LAB — I-STAT CG4 LACTIC ACID, ED
Lactic Acid, Venous: 1.38 mmol/L (ref 0.5–1.9)
Lactic Acid, Venous: 1.12 mmol/L (ref 0.5–1.9)

## 2016-09-25 MED ORDER — SODIUM CHLORIDE 0.9 % IV SOLN
INTRAVENOUS | Status: DC
  Administered 2016-09-25: 1000 mL via INTRAVENOUS

## 2016-09-25 MED ORDER — ACETAMINOPHEN 325 MG PO TABS
650.0000 mg | ORAL_TABLET | ORAL | Status: DC | PRN
Start: 2016-09-25 — End: 2016-09-27
  Administered 2016-09-25 – 2016-09-27 (×10): 650 mg via ORAL
  Filled 2016-09-25 (×11): qty 2

## 2016-09-25 MED ORDER — VANCOMYCIN HCL 500 MG IV SOLR
500.0000 mg | Freq: Two times a day (BID) | INTRAVENOUS | Status: DC
  Administered 2016-09-25: 500 mg via INTRAVENOUS
  Filled 2016-09-25: qty 500

## 2016-09-25 MED ORDER — MORPHINE SULFATE (PF) 4 MG/ML IV SOLN: 2.0000 mg | INTRAVENOUS | Status: DC | PRN

## 2016-09-25 MED ORDER — SODIUM CHLORIDE 0.9 % IV BOLUS (SEPSIS)
1000.0000 mL | Freq: Once | INTRAVENOUS | Status: AC
  Administered 2016-09-25: 1000 mL via INTRAVENOUS

## 2016-09-25 MED ORDER — ENSURE ENLIVE PO LIQD
237.0000 mL | Freq: Three times a day (TID) | ORAL | Status: DC
  Administered 2016-09-25 – 2016-09-27 (×5): 237 mL via ORAL

## 2016-09-25 MED ORDER — ENOXAPARIN SODIUM 40 MG/0.4ML ~~LOC~~ SOLN: 40.0000 mg | SUBCUTANEOUS | Status: DC

## 2016-09-25 MED ORDER — IOPAMIDOL (ISOVUE-370) INJECTION 76%
INTRAVENOUS | Status: AC
  Administered 2016-09-25: 100 mL via INTRAVENOUS
  Filled 2016-09-25: qty 100

## 2016-09-25 MED ORDER — PIPERACILLIN-TAZOBACTAM 3.375 G IVPB 30 MIN
3.3750 g | Freq: Once | INTRAVENOUS | Status: AC
Start: 2016-09-25 — End: 2016-09-25
  Administered 2016-09-25: 3.375 g via INTRAVENOUS
  Filled 2016-09-25: qty 50

## 2016-09-25 MED ORDER — FENTANYL CITRATE (PF) 100 MCG/2ML IJ SOLN: 50.0000 ug | Freq: Once | INTRAMUSCULAR | Status: DC

## 2016-09-25 MED ORDER — ADULT MULTIVITAMIN W/MINERALS CH
1.0000 | ORAL_TABLET | Freq: Every day | ORAL | Status: DC
  Administered 2016-09-25 – 2016-09-27 (×3): 1 via ORAL
  Filled 2016-09-25 (×3): qty 1

## 2016-09-25 MED ORDER — ENOXAPARIN SODIUM 40 MG/0.4ML ~~LOC~~ SOLN
40.0000 mg | SUBCUTANEOUS | Status: DC
  Filled 2016-09-25 (×3): qty 0.4

## 2016-09-25 MED ORDER — ORAL CARE MOUTH RINSE
15.0000 mL | Freq: Two times a day (BID) | OROMUCOSAL | Status: DC
  Administered 2016-09-25 – 2016-09-26 (×3): 15 mL via OROMUCOSAL

## 2016-09-25 MED ORDER — VANCOMYCIN HCL IN DEXTROSE 1-5 GM/200ML-% IV SOLN
1000.0000 mg | Freq: Once | INTRAVENOUS | Status: AC
  Administered 2016-09-25: 1000 mg via INTRAVENOUS
  Filled 2016-09-25: qty 200

## 2016-09-25 MED ORDER — PIPERACILLIN-TAZOBACTAM 3.375 G IVPB
3.3750 g | Freq: Three times a day (TID) | INTRAVENOUS | Status: DC
  Administered 2016-09-25: 3.375 g via INTRAVENOUS
  Filled 2016-09-25 (×2): qty 50

## 2016-09-25 MED ORDER — IOPAMIDOL (ISOVUE-370) INJECTION 76%
100.0000 mL | Freq: Once | INTRAVENOUS | Status: AC | PRN
  Administered 2016-09-25: 100 mL via INTRAVENOUS

## 2016-09-25 NOTE — Consult Note (Signed)
[facility] Surgery Consult Note  Ann Oliver [birth year]  [id number].    Requesting MD: Lonny Prude Chief Complaint/Reason for Consult: Breast mass  HPI:  Ann Oliver is a 61yo female PMH significant for breast cancer initially diagnosed in 09/2015 on mammogram; biopsy 10/22/2015 proved breast mass to be invasive adenocarcinoma grade 2-3 with a Ki-67 30% that was ER positive PR negative. Patient decided not to do anything with the cancer and was lost to follow-up. She noticed a left breast mass in 04/2016 and went to the ED where CT of the chest detected metastatic disease to the lungs and liver. She again followed up with [name] 04/2016 who recommended biopsy of mass, PET/CT scan, MRI brain, and to start antiestrogen therapy with letrozole 2.5 mg by mouth daily; patient again did not follow up. She went to the ED in February of this year complaining of draining/increased left breast mass; when general surgery consult was recommended she decided to leave AMA. Patient again returned to ED today with the same complaint of enlarging/draining breast mass. CT angio showed left lateral fungating breast mass that extends into the axilla (12.5 cm in maximum diameter) with multiple enlarged axillary lymph nodes; metastatic disease seen in lungs. WBC and lactic acid WNL, patient is afebrile. She was agreeable to admission for IV antibiotics, but does not report any interest in workup/treatment for her breast cancer. She was started on zosyn and vancomycin. Patient states that she is having no pain, fever, or chills. States that she has God on her side and does not need to tell her family about her breast cancer diagnosis.  PMH significant for breast cancer Abdominal surgical history: C. section Employment: currently unemployed Lives at home with 1 of her sons; has not told family about diagnosis  ROS: Review of Systems  Constitutional: Negative.   HENT: Negative.   Eyes: Negative.     Respiratory: Negative.   Cardiovascular: Negative.   Gastrointestinal: Negative.   Genitourinary: Negative.   Musculoskeletal: Negative.   Skin:       Left breast mass  Neurological: Negative.   All systems reviewed and otherwise negative except for as above  No family history on file.  Past Medical History:  Diagnosis Date  . Sinusitis   . Tumor cells     Past Surgical History:  Procedure Laterality Date  . BIOPSY BREAST      Social History:  reports that she has never smoked. She has never used smokeless tobacco. She reports that she does not drink alcohol or use drugs.  Allergies:  Allergies  Allergen Reactions  . Aspirin Palpitations    Medications Prior to Admission  Medication Sig Dispense Refill  . letrozole (FEMARA) 2.5 MG tablet Take 1 tablet (2.5 mg total) by mouth daily. (Patient not taking: Reported on 09/25/2016) 90 tablet 3    Prior to Admission medications   Medication Sig Start Date End Date Taking? Authorizing Provider  letrozole (FEMARA) 2.5 MG tablet Take 1 tablet (2.5 mg total) by mouth daily. Patient not taking: Reported on 09/25/2016 06/09/16   Nicholas Lose, MD    Blood pressure (!) 141/69, pulse 87, temperature 98 F (36.7 C), temperature source Oral, resp. rate 16, height 5' 2"  (1.575 m), weight 130 lb 3.2 oz (59.1 kg), SpO2 100 %. Physical Exam: General: pleasant, chronically ill appearing AA female who is sitting up in bed in NAD HEENT: head is normocephalic, atraumatic.  Sclera are noninjected.  Pupils equal and round.  Ears and nose without  any masses or lesions.  Mouth is pink and moist. Dentition fair Heart: regular, rate, and rhythm.  No obvious murmurs, gallops, or rubs noted.  Palpable pedal pulses bilaterally Lungs: CTAB, no wheezes, rhonchi, or rales noted.  Respiratory effort nonlabored Abd: well healed vertical incision distal to umbilicus, soft, NT/ND, +BS, no masses, hernias, or organomegaly MS: all 4 extremities are symmetrical  with no cyanosis, clubbing, or edema. Skin: warm and dry with no masses, lesions, or rashes Psych: A&Ox3 with an appropriate affect. Neuro: cranial nerves grossly intact, extremity CSM intact bilaterally, normal speech Left breast: large lateral mass with foul smelling drainage, trace surrounding erythema:     Results for orders placed or performed during the hospital encounter of 09/25/16 (from the past 48 hour(s))  CBC with Differential/Platelet     Status: Abnormal   Collection Time: 09/25/16  1:05 AM  Result Value Ref Range   WBC 10.0 4.0 - 10.5 K/uL   RBC 4.04 3.87 - 5.11 MIL/uL   Hemoglobin 11.0 (L) 12.0 - 15.0 g/dL   HCT 33.9 (L) 36.0 - 46.0 %   MCV 83.9 78.0 - 100.0 fL   MCH 27.2 26.0 - 34.0 pg   MCHC 32.4 30.0 - 36.0 g/dL   RDW 13.3 11.5 - 15.5 %   Platelets 452 (H) 150 - 400 K/uL   Neutrophils Relative % 76 %   Neutro Abs 7.7 1.7 - 7.7 K/uL   Lymphocytes Relative 14 %   Lymphs Abs 1.4 0.7 - 4.0 K/uL   Monocytes Relative 9 %   Monocytes Absolute 0.9 0.1 - 1.0 K/uL   Eosinophils Relative 1 %   Eosinophils Absolute 0.1 0.0 - 0.7 K/uL   Basophils Relative 0 %   Basophils Absolute 0.0 0.0 - 0.1 K/uL  Comprehensive metabolic panel     Status: Abnormal   Collection Time: 09/25/16  1:18 AM  Result Value Ref Range   Sodium 139 135 - 145 mmol/L   Potassium 3.4 (L) 3.5 - 5.1 mmol/L   Chloride 102 101 - 111 mmol/L   CO2 26 22 - 32 mmol/L   Glucose, Bld 118 (H) 65 - 99 mg/dL   BUN 16 6 - 20 mg/dL   Creatinine, Ser 0.88 0.44 - 1.00 mg/dL   Calcium 9.7 8.9 - 10.3 mg/dL   Total Protein 7.9 6.5 - 8.1 g/dL   Albumin 4.0 3.5 - 5.0 g/dL   AST 29 15 - 41 U/L   ALT 19 14 - 54 U/L   Alkaline Phosphatase 74 38 - 126 U/L   Total Bilirubin 0.3 0.3 - 1.2 mg/dL   GFR calc non Af Amer >60 >60 mL/min   GFR calc Af Amer >60 >60 mL/min    Comment: (NOTE) The eGFR has been calculated using the CKD EPI equation. This calculation has not been validated in all clinical situations. eGFR's  persistently <60 mL/min signify possible Chronic Kidney Disease.    Anion gap 11 5 - 15  I-Stat CG4 Lactic Acid, ED     Status: None   Collection Time: 09/25/16  1:31 AM  Result Value Ref Range   Lactic Acid, Venous 1.38 0.5 - 1.9 mmol/L  I-Stat Chem 8, ED     Status: Abnormal   Collection Time: 09/25/16  1:31 AM  Result Value Ref Range   Sodium 138 135 - 145 mmol/L   Potassium 3.6 3.5 - 5.1 mmol/L   Chloride 103 101 - 111 mmol/L   BUN 17 6 - 20  mg/dL   Creatinine, Ser 0.90 0.44 - 1.00 mg/dL   Glucose, Bld 115 (H) 65 - 99 mg/dL   Calcium, Ion 1.09 (L) 1.15 - 1.40 mmol/L   TCO2 28 0 - 100 mmol/L   Hemoglobin 11.9 (L) 12.0 - 15.0 g/dL   HCT 35.0 (L) 36.0 - 46.0 %  Urinalysis, Routine w reflex microscopic     Status: Abnormal   Collection Time: 09/25/16  2:30 AM  Result Value Ref Range   Color, Urine YELLOW YELLOW   APPearance CLEAR CLEAR   Specific Gravity, Urine 1.019 1.005 - 1.030   pH 5.0 5.0 - 8.0   Glucose, UA NEGATIVE NEGATIVE mg/dL   Hgb urine dipstick NEGATIVE NEGATIVE   Bilirubin Urine NEGATIVE NEGATIVE   Ketones, ur 5 (A) NEGATIVE mg/dL   Protein, ur NEGATIVE NEGATIVE mg/dL   Nitrite NEGATIVE NEGATIVE   Leukocytes, UA NEGATIVE NEGATIVE  I-Stat CG4 Lactic Acid, ED     Status: None   Collection Time: 09/25/16  4:04 AM  Result Value Ref Range   Lactic Acid, Venous 1.12 0.5 - 1.9 mmol/L   Ct Angio Chest Pe W Or Wo Contrast  Result Date: 09/25/2016 CLINICAL DATA:  Tachycardia left axillary mass history of breast cancer EXAM: CT ANGIOGRAPHY CHEST WITH CONTRAST TECHNIQUE: Multidetector CT imaging of the chest was performed using the standard protocol during bolus administration of intravenous contrast. Multiplanar CT image reconstructions and MIPs were obtained to evaluate the vascular anatomy. CONTRAST:  100 mL Isovue 370 intravenous COMPARISON:  Chest CT 05/06/2016 FINDINGS: Cardiovascular: Satisfactory opacification of the pulmonary arteries to the segmental level. No  evidence of pulmonary embolism. Normal heart size. No pericardial effusion. Non aneurysmal aorta. No dissection. Mild atherosclerotic calcification. Mediastinum/Nodes: Midline trachea. 1.8 cm low-density nodule in the right lobe of the thyroid. No significantly enlarged mediastinal lymph nodes. There are small hilar nodes. Esophagus within normal limits. Multiple enlarged left axillary lymph nodes, increased in size, largest lymph node measures 3 x 1.8 cm. Lungs/Pleura: Multiple pulmonary nodules consistent with metastatic disease. There are multiple new pulmonary nodules in addition to enlargement of previously visualized pulmonary nodules. The largest nodule is visualized in the lingula and measures 1.6 cm in diameter. No pleural effusion or acute pulmonary infiltrate. Upper Abdomen: The previously noted vague hypodense liver mass is non included in the field of view. Musculoskeletal: No suspicious bony lesions. Interval increase in size of a left lateral breast mass that extends into the left axilla, this measures 12 x 8.4 by 12.5 cm, compared with prior measurements of 5.1 x 5 x 6.2 cm. Mass appears adherent to the left pectoralis musculature laterally. No gross bony invasion of the thoracic skeleton. Review of the MIP images confirms the above findings. IMPRESSION: 1. Negative for acute pulmonary embolus or aortic dissection 2. Significant enlargement of a left lateral fungating breast mass that extends into the axilla, now measuring 12.5 cm in maximum diameter. Interval enlargement of multiple suspicious axillary lymph nodes. 3. Increased number and size of multiple pulmonary nodules consistent with metastatic disease. Small hilar nodes also suspicious for metastatic disease. 4. 1.8 cm low-density nodule in the right lobe of the thyroid. Electronically Signed   By: Donavan Foil M.D.   On: 09/25/2016 02:28      Assessment/Plan Metastatic breast cancer with fungating left breast mass - diagnosed 09/2015  on mammogram; biopsy 10/22/2015 proved breast mass to be invasive adenocarcinoma grade 2-3 with a Ki-67 30% that was ER positive PR negative - patient did  not follow-up, and now her cancer has progressed to stage IV with pulmonary and liver mets - CT scan this admission shows left lateral fungating breast mass that extends into the axilla (12.5 cm in maximum diameter) with multiple enlarged axillary lymph nodes; metastatic disease seen in lungs - WBC and lactic acid WNL, patient is afebrile - started on IV zosyn and vancomycin 09/25/16  Plan - For wound care, recommend twice daily wet to dry dressing changes. Ok to shower with wound open. She does not have an underlying abscess that needs to be drained. Patient would benefit from oncology consult this admission to review her treatment options, as she does not seem to fully understand her diagnosis. Will discuss further with MD.  Jerrye Beavers, [facility] 09/25/2016, 12:52 PM Pager: [phone] Consults: [phone] Mon-Fri 7:00 am-4:30 pm Sat-Sun 7:00 am-11:30 am

## 2016-09-25 NOTE — Progress Notes (Addendum)
Initial Nutrition Assessment  DOCUMENTATION CODES:   Non-severe (moderate) malnutrition in context of chronic illness  INTERVENTION:  Ensure Enlive po BID, each supplement provides 350 kcal and 20 grams of protein  Magic cup TID with meals, each supplement provides 290 kcal and 9 grams of protein  MVI  NUTRITION DIAGNOSIS:   Malnutrition (moderate) related to cancer and cancer related treatments as evidenced by moderate depletions of muscle mass, moderate depletion of body fat.  GOAL:   Patient will meet greater than or equal to 90% of their needs  MONITOR:   PO intake, Supplement acceptance, Labs, Weight trends, Skin  REASON FOR ASSESSMENT:   Malnutrition Screening Tool    ASSESSMENT:   61 y.o. female with medical history significant of BRCA initially diagnosed in May of last year.  Patient has not had chemo, radiation, nor surgery for breast cancer up to this point, and instead has pursued homeopathic remedies. Patient with fungating mass of breast that is grossly infected and foul smelling, 12.5cm tumor on CT scan, metastatic axillary lymph nodes, mets to lung.    Met with pt in room today. Pt reports great appetite today and pta but reports that she does not always have access to food. Pt reports that she gets SNAP benefits but is often out of food by the end of the month. Pt reports that she cannot afford Ensure or any supplements. Pt currently eating 50% meals. Per chart, pt has remained wt stable but does have moderate depletions of muscle and fat. RD discussed with pt the importance of adequate protein as pt with cancer and infection. RD gave pt tips on how to improve protein intake when limited food is available. Pt would like to have Ensure and Magic Cups in hospital; RD will order.   Medications reviewed and include: lovenox, fentanyl, zosyn, vancomycin  Labs reviewed: iCa 1.09(L)  Nutrition-Focused physical exam completed. Findings are modrate fat depletion in arms  and chest, moderate muscle depletion over entire body, and no edema.   Diet Order:  Diet regular Room service appropriate? Yes; Fluid consistency: Thin  Skin:  Wound (see comment) (breast wound)  Last BM:  none since admit  Height:   Ht Readings from Last 1 Encounters:  09/25/16 5' 2"  (1.575 m)    Weight:   Wt Readings from Last 1 Encounters:  09/25/16 130 lb 3.2 oz (59.1 kg)    Ideal Body Weight:  50 kg  BMI:  Body mass index is 23.81 kg/m.  Estimated Nutritional Needs:   Kcal:  1600-1900kcal/day   Protein:  89-100g/day   Fluid:  >1.6L/day   EDUCATION NEEDS:   No education needs identified at this time  Koleen Distance, RD, LDN Pager #[phone]

## 2016-09-25 NOTE — Progress Notes (Signed)
Pharmacy Antibiotic Note  Ann Oliver is a 61 y.o. female with hx of metastatic breast ca admitted on 09/25/2016 with wound infection.  Pharmacy has been consulted for zosyn/vancomycin dosing.  Plan: Zosyn 3.375g IV q8h (4 hour infusion).  Vancomycin 1 Gm x1 then 500 mg IV q12h VT=15-20 mg/L Daily Scr, cultures,      Temp (24hrs), Avg:98.2 F (36.8 C), Min:98.2 F (36.8 C), Max:98.2 F (36.8 C)   Recent Labs Lab 09/25/16 0105 09/25/16 0118 09/25/16 0131  WBC 10.0  --   --   CREATININE  --  0.88 0.90  LATICACIDVEN  --   --  1.38    CrCl cannot be calculated (Unknown ideal weight.).    Allergies  Allergen Reactions  . Aspirin Palpitations    Antimicrobials this admission: 5/4 zosyn >>  5/4 vancomycin >>   Dose adjustments this admission:   Microbiology results:  BCx:   UCx:    Sputum:    MRSA PCR:   Thank you for allowing pharmacy to be a part of this patient's care.  Dorrene German 09/25/2016 3:49 AM

## 2016-09-25 NOTE — Progress Notes (Signed)
Patient seen and examined at bedside, patient admitted after midnight, please see earlier detailed admission note by Etta Quill, DO. Briefly, patient presented with a fungating breast mass concerning for infection. Appears to just be necrotic rather than infected. Will discontinue antibiotics. General surgery and oncology consulted.  Cordelia Poche, MD [facility] 09/25/2016, 2:10 PM Pager: [phone]

## 2016-09-25 NOTE — ED Provider Notes (Addendum)
[facility] DEPT Provider Note   CSN: [id number] Arrival date & time: 09/24/16  2224     History   Chief Complaint Chief Complaint  Patient presents with  . Abscess    HPI Ann Oliver is a 61 y.o. female.  The history is provided by the patient.  Abscess  Location:  Shoulder/arm Shoulder/arm abscess location:  L axilla Abscess quality: draining, induration, painful and weeping   Red streaking: no   Progression:  Worsening Pain details:    Quality:  Dull   Severity:  Moderate   Timing:  Constant   Progression:  Worsening Context: not injected drug use   Relieved by:  Nothing Worsened by:  Nothing Ineffective treatments:  None tried Associated symptoms: no fever and no vomiting   Risk factors: no family hx of MRSA   History of left breast cancer.  Has not had chemo or radiation.  Had a biopsy and elected homeopathic treatment.  Mass in the left axilla is rapidly enlarging and draining purulent secretions.  No f/c/r.  She states now that she will accept further conventional evaluation and treatment as long as no one tells her family anything about her or her condition.    Past Medical History:  Diagnosis Date  . Sinusitis   . Tumor cells     Patient Active Problem List   Diagnosis Date Noted  . Breast cancer of upper-outer quadrant of left female breast ([place]) 10/22/2015  . THYROID NODULE, RIGHT 05/31/2007  . DEPRESSION 05/31/2007  . GERD 05/31/2007  . HOT FLASHES 05/31/2007  . HEADACHE 05/31/2007    Past Surgical History:  Procedure Laterality Date  . BIOPSY BREAST      OB History    No data available       Home Medications    Prior to Admission medications   Medication Sig Start Date End Date Taking? Authorizing Provider  letrozole (FEMARA) 2.5 MG tablet Take 1 tablet (2.5 mg total) by mouth daily. Patient not taking: Reported on 09/25/2016 06/09/16   Nicholas Lose, MD    Family History No family history on file.  Social History Social  History  Substance Use Topics  . Smoking status: Never Smoker  . Smokeless tobacco: Never Used  . Alcohol use No     Allergies   Aspirin   Review of Systems Review of Systems  Constitutional: Negative for fever.  Eyes: Negative for photophobia.  Respiratory: Negative for chest tightness and shortness of breath.   Gastrointestinal: Negative for abdominal pain and vomiting.  Skin: Positive for color change and wound.  All other systems reviewed and are negative.    Physical Exam Updated Vital Signs BP (!) 162/87 (BP Location: Right Arm)   Pulse 95   Temp 98.2 F (36.8 C) (Oral)   Resp 18   SpO2 100%   Physical Exam  Constitutional: She is oriented to person, place, and time. She appears well-developed and well-nourished. No distress.  HENT:  Head: Normocephalic and atraumatic.  Mouth/Throat: No oropharyngeal exudate.  Eyes: EOM are normal. Pupils are equal, round, and reactive to light.  Neck: Normal range of motion. Neck supple.  Cardiovascular: Normal rate, regular rhythm, normal heart sounds and intact distal pulses.     Pulmonary/Chest: Effort normal and breath sounds normal. No respiratory distress. She has no wheezes. She has no rales.  Abdominal: Soft. Bowel sounds are normal. She exhibits no mass. There is no tenderness. There is no rebound and no guarding.  Musculoskeletal: Normal range of  motion. She exhibits no edema, tenderness or deformity.  Neurological: She is alert and oriented to person, place, and time. She displays normal reflexes.  Skin: Skin is warm and dry. Capillary refill takes less than 2 seconds.  Psychiatric: She has a normal mood and affect.     ED Treatments / Results   Vitals:   09/25/16 0144 09/25/16 0232  BP: (!) 146/85 (!) 146/85  Pulse: 98 (!) 103  Resp: 19 18  Temp:     Labs (all labs ordered are listed, but only abnormal results are displayed)  Results for orders placed or performed during the hospital encounter of  09/25/16  Comprehensive metabolic panel  Result Value Ref Range   Sodium 139 135 - 145 mmol/L   Potassium 3.4 (L) 3.5 - 5.1 mmol/L   Chloride 102 101 - 111 mmol/L   CO2 26 22 - 32 mmol/L   Glucose, Bld 118 (H) 65 - 99 mg/dL   BUN 16 6 - 20 mg/dL   Creatinine, Ser 0.88 0.44 - 1.00 mg/dL   Calcium 9.7 8.9 - 10.3 mg/dL   Total Protein 7.9 6.5 - 8.1 g/dL   Albumin 4.0 3.5 - 5.0 g/dL   AST 29 15 - 41 U/L   ALT 19 14 - 54 U/L   Alkaline Phosphatase 74 38 - 126 U/L   Total Bilirubin 0.3 0.3 - 1.2 mg/dL   GFR calc non Af Amer >60 >60 mL/min   GFR calc Af Amer >60 >60 mL/min   Anion gap 11 5 - 15  Urinalysis, Routine w reflex microscopic  Result Value Ref Range   Color, Urine YELLOW YELLOW   APPearance CLEAR CLEAR   Specific Gravity, Urine 1.019 1.005 - 1.030   pH 5.0 5.0 - 8.0   Glucose, UA NEGATIVE NEGATIVE mg/dL   Hgb urine dipstick NEGATIVE NEGATIVE   Bilirubin Urine NEGATIVE NEGATIVE   Ketones, ur 5 (A) NEGATIVE mg/dL   Protein, ur NEGATIVE NEGATIVE mg/dL   Nitrite NEGATIVE NEGATIVE   Leukocytes, UA NEGATIVE NEGATIVE  CBC with Differential/Platelet  Result Value Ref Range   WBC 10.0 4.0 - 10.5 K/uL   RBC 4.04 3.87 - 5.11 MIL/uL   Hemoglobin 11.0 (L) 12.0 - 15.0 g/dL   HCT 33.9 (L) 36.0 - 46.0 %   MCV 83.9 78.0 - 100.0 fL   MCH 27.2 26.0 - 34.0 pg   MCHC 32.4 30.0 - 36.0 g/dL   RDW 13.3 11.5 - 15.5 %   Platelets 452 (H) 150 - 400 K/uL   Neutrophils Relative % 76 %   Neutro Abs 7.7 1.7 - 7.7 K/uL   Lymphocytes Relative 14 %   Lymphs Abs 1.4 0.7 - 4.0 K/uL   Monocytes Relative 9 %   Monocytes Absolute 0.9 0.1 - 1.0 K/uL   Eosinophils Relative 1 %   Eosinophils Absolute 0.1 0.0 - 0.7 K/uL   Basophils Relative 0 %   Basophils Absolute 0.0 0.0 - 0.1 K/uL  I-Stat CG4 Lactic Acid, ED  Result Value Ref Range   Lactic Acid, Venous 1.38 0.5 - 1.9 mmol/L  I-Stat Chem 8, ED  Result Value Ref Range   Sodium 138 135 - 145 mmol/L   Potassium 3.6 3.5 - 5.1 mmol/L   Chloride  103 101 - 111 mmol/L   BUN 17 6 - 20 mg/dL   Creatinine, Ser 0.90 0.44 - 1.00 mg/dL   Glucose, Bld 115 (H) 65 - 99 mg/dL   Calcium, Ion 1.09 (L) 1.15 -  1.40 mmol/L   TCO2 28 0 - 100 mmol/L   Hemoglobin 11.9 (L) 12.0 - 15.0 g/dL   HCT 35.0 (L) 36.0 - 46.0 %   Ct Angio Chest Pe W Or Wo Contrast  Result Date: 09/25/2016 CLINICAL DATA:  Tachycardia left axillary mass history of breast cancer EXAM: CT ANGIOGRAPHY CHEST WITH CONTRAST TECHNIQUE: Multidetector CT imaging of the chest was performed using the standard protocol during bolus administration of intravenous contrast. Multiplanar CT image reconstructions and MIPs were obtained to evaluate the vascular anatomy. CONTRAST:  100 mL Isovue 370 intravenous COMPARISON:  Chest CT 05/06/2016 FINDINGS: Cardiovascular: Satisfactory opacification of the pulmonary arteries to the segmental level. No evidence of pulmonary embolism. Normal heart size. No pericardial effusion. Non aneurysmal aorta. No dissection. Mild atherosclerotic calcification. Mediastinum/Nodes: Midline trachea. 1.8 cm low-density nodule in the right lobe of the thyroid. No significantly enlarged mediastinal lymph nodes. There are small hilar nodes. Esophagus within normal limits. Multiple enlarged left axillary lymph nodes, increased in size, largest lymph node measures 3 x 1.8 cm. Lungs/Pleura: Multiple pulmonary nodules consistent with metastatic disease. There are multiple new pulmonary nodules in addition to enlargement of previously visualized pulmonary nodules. The largest nodule is visualized in the lingula and measures 1.6 cm in diameter. No pleural effusion or acute pulmonary infiltrate. Upper Abdomen: The previously noted vague hypodense liver mass is non included in the field of view. Musculoskeletal: No suspicious bony lesions. Interval increase in size of a left lateral breast mass that extends into the left axilla, this measures 12 x 8.4 by 12.5 cm, compared with prior measurements of  5.1 x 5 x 6.2 cm. Mass appears adherent to the left pectoralis musculature laterally. No gross bony invasion of the thoracic skeleton. Review of the MIP images confirms the above findings. IMPRESSION: 1. Negative for acute pulmonary embolus or aortic dissection 2. Significant enlargement of a left lateral fungating breast mass that extends into the axilla, now measuring 12.5 cm in maximum diameter. Interval enlargement of multiple suspicious axillary lymph nodes. 3. Increased number and size of multiple pulmonary nodules consistent with metastatic disease. Small hilar nodes also suspicious for metastatic disease. 4. 1.8 cm low-density nodule in the right lobe of the thyroid. Electronically Signed   By: Donavan Foil M.D.   On: 09/25/2016 02:28    Procedures Procedures (including critical care time)  Medications Ordered in ED  Medications  fentaNYL (SUBLIMAZE) injection 50 mcg (not administered)  vancomycin (VANCOCIN) IVPB 1000 mg/200 mL premix (1,000 mg Intravenous New Bag/Given 09/25/16 0115)  piperacillin-tazobactam (ZOSYN) IVPB 3.375 g (0 g Intravenous Stopped 09/25/16 0223)  sodium chloride 0.9 % bolus 1,000 mL (1,000 mLs Intravenous New Bag/Given 09/25/16 0135)  iopamidol (ISOVUE-370) 76 % injection 100 mL (100 mLs Intravenous Contrast Given 09/25/16 0157)    Final Clinical Impressions(s) / ED Diagnoses   Final diagnoses:  Mass in armpit  Metastatic breast CA with infection of the necrosing fungating mass: will admit to medicine for further work up and treatment.     I personally performed the services described in this documentation, which was scribed in my presence. The recorded information has been reviewed and is accurate.      Veatrice Kells, MD 09/25/16 [id number]    Veatrice Kells, MD 09/25/16 [phone]

## 2016-09-25 NOTE — ED Notes (Signed)
Pt has a history of breast cancer.  Pt has strong odor present.

## 2016-09-25 NOTE — Consult Note (Signed)
[facility] Face-to-Face Psychiatry Consult   Reason for Consult:  Capacity Referring Physician:  Dr. Lonny Prude Patient Identification: Ann Oliver MRN:  [id number] Principal Diagnosis: Breast cancer of upper-outer quadrant of left female breast [facility]) Diagnosis:   Patient Active Problem List   Diagnosis Date Noted  . Breast cancer metastasized to lung, right ([place]) [C50.911, C78.00] 09/25/2016  . Breast cancer of upper-outer quadrant of left female breast ([place]) [C50.412] 10/22/2015  . THYROID NODULE, RIGHT [E04.1] 05/31/2007  . DEPRESSION [F32.9] 05/31/2007  . GERD [K21.9] 05/31/2007  . HOT FLASHES [N95.1] 05/31/2007  . HEADACHE [R51] 05/31/2007    Total Time spent with patient: 1 hour  Subjective:   Ann Oliver is a 61 y.o. female patient admitted with breast cancer with metastasis.  HPI:  Ann Oliver is a 61 y.o. female, seen, chart reviewed along with unit CSW for this face-to-face psychiatric consultation evaluation for capacity to make her own medical conditions. Patient is awake, alert, oriented to time place person and situation. Patient also reported she has a lump in her breast and she has been receiving medication from physician but she could not provide the names of the medication or name of the doctor, saying that she forgot. Patient also reported she has a draining from the lump in her breast. Patient reported she is willing to discuss with the hospital doctors regarding different treatment possibilities including surgery, radiation therapy and chemotherapy. Patient has intact orientation, memory, concentration and language functions. 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 Reportedly she is not working since 2010 and has been living with her son. Patient reportedly had 5 children.  Past Psychiatric History: None reported  Risk to Self: Is patient at risk for suicide?: No Risk to Others:    Prior Inpatient Therapy:   Prior Outpatient Therapy:    Past Medical History:  Past Medical History:  Diagnosis Date  . Sinusitis   . Tumor cells     Past Surgical History:  Procedure Laterality Date  . BIOPSY BREAST     Family History: No family history on file. Family Psychiatric  History: Noncontributory Social History:  History  Alcohol Use No     History  Drug Use No    Social History   Social History  . Marital status: Single    Spouse name: N/A  . Number of children: N/A  . Years of education: N/A   Social History Main Topics  . Smoking status: Never Smoker  . Smokeless tobacco: Never Used  . Alcohol use No  . Drug use: No  . Sexual activity: Not Asked   Other Topics Concern  . None   Social History Narrative  . None   Additional Social History:    Allergies:   Allergies  Allergen Reactions  . Aspirin Palpitations    Labs:  Results for orders placed or performed during the hospital encounter of 09/25/16 (from the past 48 hour(s))  CBC with Differential/Platelet     Status: Abnormal   Collection Time: 09/25/16  1:05 AM  Result Value Ref Range   WBC 10.0 4.0 - 10.5 K/uL   RBC 4.04 3.87 - 5.11 MIL/uL   Hemoglobin 11.0 (L) 12.0 - 15.0 g/dL   HCT 33.9 (L) 36.0 - 46.0 %   MCV 83.9 78.0 - 100.0 fL   MCH 27.2 26.0 - 34.0 pg   MCHC 32.4 30.0 - 36.0 g/dL   RDW 13.3 11.5 - 15.5 %  Platelets 452 (H) 150 - 400 K/uL   Neutrophils Relative % 76 %   Neutro Abs 7.7 1.7 - 7.7 K/uL   Lymphocytes Relative 14 %   Lymphs Abs 1.4 0.7 - 4.0 K/uL   Monocytes Relative 9 %   Monocytes Absolute 0.9 0.1 - 1.0 K/uL   Eosinophils Relative 1 %   Eosinophils Absolute 0.1 0.0 - 0.7 K/uL   Basophils Relative 0 %   Basophils Absolute 0.0 0.0 - 0.1 K/uL  Comprehensive metabolic panel     Status: Abnormal   Collection Time: 09/25/16  1:18 AM  Result Value Ref Range   Sodium 139 135 - 145 mmol/L   Potassium 3.4 (L) 3.5 - 5.1 mmol/L   Chloride 102 101 - 111 mmol/L    CO2 26 22 - 32 mmol/L   Glucose, Bld 118 (H) 65 - 99 mg/dL   BUN 16 6 - 20 mg/dL   Creatinine, Ser 0.88 0.44 - 1.00 mg/dL   Calcium 9.7 8.9 - 10.3 mg/dL   Total Protein 7.9 6.5 - 8.1 g/dL   Albumin 4.0 3.5 - 5.0 g/dL   AST 29 15 - 41 U/L   ALT 19 14 - 54 U/L   Alkaline Phosphatase 74 38 - 126 U/L   Total Bilirubin 0.3 0.3 - 1.2 mg/dL   GFR calc non Af Amer >60 >60 mL/min   GFR calc Af Amer >60 >60 mL/min    Comment: (NOTE) The eGFR has been calculated using the CKD EPI equation. This calculation has not been validated in all clinical situations. eGFR's persistently <60 mL/min signify possible Chronic Kidney Disease.    Anion gap 11 5 - 15  I-Stat CG4 Lactic Acid, ED     Status: None   Collection Time: 09/25/16  1:31 AM  Result Value Ref Range   Lactic Acid, Venous 1.38 0.5 - 1.9 mmol/L  I-Stat Chem 8, ED     Status: Abnormal   Collection Time: 09/25/16  1:31 AM  Result Value Ref Range   Sodium 138 135 - 145 mmol/L   Potassium 3.6 3.5 - 5.1 mmol/L   Chloride 103 101 - 111 mmol/L   BUN 17 6 - 20 mg/dL   Creatinine, Ser 0.90 0.44 - 1.00 mg/dL   Glucose, Bld 115 (H) 65 - 99 mg/dL   Calcium, Ion 1.09 (L) 1.15 - 1.40 mmol/L   TCO2 28 0 - 100 mmol/L   Hemoglobin 11.9 (L) 12.0 - 15.0 g/dL   HCT 35.0 (L) 36.0 - 46.0 %  Urinalysis, Routine w reflex microscopic     Status: Abnormal   Collection Time: 09/25/16  2:30 AM  Result Value Ref Range   Color, Urine YELLOW YELLOW   APPearance CLEAR CLEAR   Specific Gravity, Urine 1.019 1.005 - 1.030   pH 5.0 5.0 - 8.0   Glucose, UA NEGATIVE NEGATIVE mg/dL   Hgb urine dipstick NEGATIVE NEGATIVE   Bilirubin Urine NEGATIVE NEGATIVE   Ketones, ur 5 (A) NEGATIVE mg/dL   Protein, ur NEGATIVE NEGATIVE mg/dL   Nitrite NEGATIVE NEGATIVE   Leukocytes, UA NEGATIVE NEGATIVE  I-Stat CG4 Lactic Acid, ED     Status: None   Collection Time: 09/25/16  4:04 AM  Result Value Ref Range   Lactic Acid, Venous 1.12 0.5 - 1.9 mmol/L    Current  Facility-Administered Medications  Medication Dose Route Frequency Provider Last Rate Last Dose  . 0.9 %  sodium chloride infusion   Intravenous Continuous Etta Quill, DO  125 mL/hr at 09/25/16 0345    . acetaminophen (TYLENOL) tablet 650 mg  650 mg Oral Q4H PRN Jeryl Columbia, NP   650 mg at 09/25/16 0521  . enoxaparin (LOVENOX) injection 40 mg  40 mg Subcutaneous Q24H Jared M Gardner, DO      . fentaNYL (SUBLIMAZE) injection 50 mcg  50 mcg Intravenous Once April Palumbo, MD      . MEDLINE mouth rinse  15 mL Mouth Rinse BID [name], MD   15 mL at 09/25/16 0930  . morphine 4 MG/ML injection 2-4 mg  2-4 mg Intravenous Q4H PRN Etta Quill, DO      . piperacillin-tazobactam (ZOSYN) IVPB 3.375 g  3.375 g Intravenous Q8H Dorrene German, RPH 12.5 mL/hr at 09/25/16 0930 3.375 g at 09/25/16 0930  . vancomycin (VANCOCIN) 500 mg in sodium chloride 0.9 % 100 mL IVPB  500 mg Intravenous Q12H Dorrene German, [facility]        Musculoskeletal: Strength & Muscle Tone: decreased Gait & Station: unable to stand Patient leans: N/A  Psychiatric Specialty Exam: Physical Exam as per history and physical   ROS generalized weakness, tired and seeking appropriate treatment in the hospital for breast lump and drainage. Reportedly has a disturbed sleep and appetite No Fever-chills, No Headache, No changes with Vision or hearing, reports vertigo No problems swallowing food or Liquids, No Chest pain, Cough or Shortness of Breath, No Abdominal pain, No Nausea or Vommitting, Bowel movements are regular, No Blood in stool or Urine, No dysuria, No new skin rashes or bruises, No new joints pains-aches,  No new weakness, tingling, numbness in any extremity, No recent weight gain or loss, No polyuria, polydypsia or polyphagia,  A full 10 point Review of Systems was done, except as stated above, all other Review of Systems were negative.   Blood pressure (!) 141/69, pulse 87, temperature 98 F  (36.7 C), temperature source Oral, resp. rate 16, height 5' 2"  (1.575 m), weight 59.1 kg (130 lb 3.2 oz), SpO2 100 %.Body mass index is 23.81 kg/m.  General Appearance: Casual  Eye Contact:  Good  Speech:  Clear and Coherent  Volume:  Normal  Mood:  Anxious  Affect:  Congruent and Constricted  Thought Process:  Coherent and Goal Directed  Orientation:  Full (Time, Place, and Person)  Thought Content:  WDL  Suicidal Thoughts:  No  Homicidal Thoughts:  No  Memory:  Immediate;   Good Recent;   Fair Remote;   Fair  Judgement:  Impaired  Insight:  Shallow  Psychomotor Activity:  Decreased  Concentration:  Concentration: Fair and Attention Span: Fair  Recall:  Good  Fund of Knowledge:  Good  Language:  Good  Akathisia:  Negative  Handed:  Right  AIMS (if indicated):     Assets:  Communication Skills Desire for Improvement Financial Resources/Insurance Housing Leisure Time Resilience Social Support  ADL's:  Intact  Cognition:  WNL  Sleep:        Treatment Plan Summary: 61 years old female presented with breast lump and also drainage reportedly not able to seek appropriate treatment including surgery, radiation therapy or chemotherapy over one year. Patient denies symptoms of depression, anxiety, mania, psychosis and also has no current suicidal or homicidal ideation, intention or plans.   Patient meet criteria for capacity to make her own medical decisions based on my evaluation today   Patient has limited insight into her medical condition   Patient has no safety  concerns  Recommended no psychotropic medication during this evaluation  CSW will contact patient's son regarding collateral information and possible assistance with disposition plans  Disposition: No evidence of imminent risk to self or others at present.   Supportive therapy provided about ongoing stressors.  Ambrose Finland, MD 09/25/2016 9:57 AM

## 2016-09-25 NOTE — ED Notes (Signed)
Bed: PB22 Expected date:  Expected time:  Means of arrival:  Comments: [place]

## 2016-09-25 NOTE — H&P (Signed)
History and Physical    Ann Oliver WER:[id number] DOB: Feb 11, 1956 DOA: 09/25/2016  PCP: [facility]  Patient coming from: Home  I have personally briefly reviewed patient's old medical records in [place]  Chief Complaint: Breast "abscess"  HPI: Ann Oliver is a 61 y.o. female with medical history significant of BRCA initially diagnosed in May of last year.  Unfortunately patient has not had chemo, radiation, nor surgery for BRCA up to this point, and instead has pursued homeopathic remedies.  She has not informed her family of her diagnosis and has forbidden her physicians from doing so thus far.  Sadly this doesn't appear to have been effective in treating the underlying cancer which has massively progressed at this point.   ED Course: Patient with fungating mass of breast that is grossly infected and foul smelling, 12.5cm tumor on CT scan, metastatic axillary lymph nodes, mets to lung.  Patient started on zosyn and vanc.  We are again forbidden to tell her family anything though she does agree to admission.   Review of Systems: As per HPI otherwise 10 point review of systems negative.   Past Medical History:  Diagnosis Date  . Sinusitis   . Tumor cells     Past Surgical History:  Procedure Laterality Date  . BIOPSY BREAST       reports that she has never smoked. She has never used smokeless tobacco. She reports that she does not drink alcohol or use drugs.  Allergies  Allergen Reactions  . Aspirin Palpitations    No family history on file.   Prior to Admission medications   Medication Sig Start Date End Date Taking? Authorizing Provider  letrozole (FEMARA) 2.5 MG tablet Take 1 tablet (2.5 mg total) by mouth daily. Patient not taking: Reported on 09/25/2016 06/09/16   Nicholas Lose, MD    Physical Exam: Vitals:   09/24/16 2240 09/25/16 0144 09/25/16 0232  BP: (!) 162/87 (!) 146/85 (!) 146/85  Pulse: 95 98 (!) 103  Resp: 18 19 18   Temp:  98.2 F (36.8 C)    TempSrc: Oral    SpO2: 100% 100% 100%    Constitutional: NAD, calm, comfortable Eyes: PERRL, lids and conjunctivae normal ENMT: Mucous membranes are moist. Posterior pharynx clear of any exudate or lesions.Normal dentition.  Neck: normal, supple, no masses, no thyromegaly Respiratory: clear to auscultation bilaterally, no wheezing, no crackles. Normal respiratory effort. No accessory muscle use.  Cardiovascular: Regular rate and rhythm, no murmurs / rubs / gallops. No extremity edema. 2+ pedal pulses. No carotid bruits.  Abdomen: no tenderness, no masses palpated. No hepatosplenomegaly. Bowel sounds positive.  Musculoskeletal: no clubbing / cyanosis. No joint deformity upper and lower extremities. Good ROM, no contractures. Normal muscle tone.  Skin: Fungating mass of L breast and L axilla, foul smelling purulent drainage and necrosis, 17-20cm in diameter with inflammatory changes to breast Neurologic: CN 2-12 grossly intact. Sensation intact, DTR normal. Strength 5/5 in all 4.  Psychiatric: Normal judgment and insight. Alert and oriented x 3. Normal mood.    Labs on Admission: I have personally reviewed following labs and imaging studies  CBC:  Recent Labs Lab 09/25/16 0105 09/25/16 0131  WBC 10.0  --   NEUTROABS 7.7  --   HGB 11.0* 11.9*  HCT 33.9* 35.0*  MCV 83.9  --   PLT 452*  --    Basic Metabolic Panel:  Recent Labs Lab 09/25/16 0118 09/25/16 0131  NA 139 138  K 3.4*  3.6  CL 102 103  CO2 26  --   GLUCOSE 118* 115*  BUN 16 17  CREATININE 0.88 0.90  CALCIUM 9.7  --    GFR: CrCl cannot be calculated (Unknown ideal weight.). Liver Function Tests:  Recent Labs Lab 09/25/16 0118  AST 29  ALT 19  ALKPHOS 74  BILITOT 0.3  PROT 7.9  ALBUMIN 4.0   No results for input(s): LIPASE, AMYLASE in the last 168 hours. No results for input(s): AMMONIA in the last 168 hours. Coagulation Profile: No results for input(s): INR, PROTIME in the last  168 hours. Cardiac Enzymes: No results for input(s): CKTOTAL, CKMB, CKMBINDEX, TROPONINI in the last 168 hours. BNP (last 3 results) No results for input(s): PROBNP in the last 8760 hours. HbA1C: No results for input(s): HGBA1C in the last 72 hours. CBG: No results for input(s): GLUCAP in the last 168 hours. Lipid Profile: No results for input(s): CHOL, HDL, LDLCALC, TRIG, CHOLHDL, LDLDIRECT in the last 72 hours. Thyroid Function Tests: No results for input(s): TSH, T4TOTAL, FREET4, T3FREE, THYROIDAB in the last 72 hours. Anemia Panel: No results for input(s): VITAMINB12, FOLATE, FERRITIN, TIBC, IRON, RETICCTPCT in the last 72 hours. Urine analysis:    Component Value Date/Time   COLORURINE YELLOW 09/25/2016 0230   APPEARANCEUR CLEAR 09/25/2016 0230   LABSPEC 1.019 09/25/2016 0230   PHURINE 5.0 09/25/2016 0230   GLUCOSEU NEGATIVE 09/25/2016 0230   HGBUR NEGATIVE 09/25/2016 0230   BILIRUBINUR NEGATIVE 09/25/2016 0230   KETONESUR 5 (A) 09/25/2016 0230   PROTEINUR NEGATIVE 09/25/2016 0230   NITRITE NEGATIVE 09/25/2016 0230   LEUKOCYTESUR NEGATIVE 09/25/2016 0230    Radiological Exams on Admission: Ct Angio Chest Pe W Or Wo Contrast  Result Date: 09/25/2016 CLINICAL DATA:  Tachycardia left axillary mass history of breast cancer EXAM: CT ANGIOGRAPHY CHEST WITH CONTRAST TECHNIQUE: Multidetector CT imaging of the chest was performed using the standard protocol during bolus administration of intravenous contrast. Multiplanar CT image reconstructions and MIPs were obtained to evaluate the vascular anatomy. CONTRAST:  100 mL Isovue 370 intravenous COMPARISON:  Chest CT 05/06/2016 FINDINGS: Cardiovascular: Satisfactory opacification of the pulmonary arteries to the segmental level. No evidence of pulmonary embolism. Normal heart size. No pericardial effusion. Non aneurysmal aorta. No dissection. Mild atherosclerotic calcification. Mediastinum/Nodes: Midline trachea. 1.8 cm low-density nodule in  the right lobe of the thyroid. No significantly enlarged mediastinal lymph nodes. There are small hilar nodes. Esophagus within normal limits. Multiple enlarged left axillary lymph nodes, increased in size, largest lymph node measures 3 x 1.8 cm. Lungs/Pleura: Multiple pulmonary nodules consistent with metastatic disease. There are multiple new pulmonary nodules in addition to enlargement of previously visualized pulmonary nodules. The largest nodule is visualized in the lingula and measures 1.6 cm in diameter. No pleural effusion or acute pulmonary infiltrate. Upper Abdomen: The previously noted vague hypodense liver mass is non included in the field of view. Musculoskeletal: No suspicious bony lesions. Interval increase in size of a left lateral breast mass that extends into the left axilla, this measures 12 x 8.4 by 12.5 cm, compared with prior measurements of 5.1 x 5 x 6.2 cm. Mass appears adherent to the left pectoralis musculature laterally. No gross bony invasion of the thoracic skeleton. Review of the MIP images confirms the above findings. IMPRESSION: 1. Negative for acute pulmonary embolus or aortic dissection 2. Significant enlargement of a left lateral fungating breast mass that extends into the axilla, now measuring 12.5 cm in maximum diameter. Interval enlargement of  multiple suspicious axillary lymph nodes. 3. Increased number and size of multiple pulmonary nodules consistent with metastatic disease. Small hilar nodes also suspicious for metastatic disease. 4. 1.8 cm low-density nodule in the right lobe of the thyroid. Electronically Signed   By: Donavan Foil M.D.   On: 09/25/2016 02:28    EKG: Independently reviewed.  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 BRCA - now with 17-20cm fungating, necrotic, and infected mass of breast and axilla, pulm mets on  CT.  Up until this point had not been treated with chemo, radiation, nor surgery. 1. Continue zosyn and vanc started by EDP 2. Patient likely needs psych eval to determine decision making capacity by psych attending in AM. 3. Patient needs oncology and surgery evaluations as well. 4. Morphine PRN pain 5. IVF  DVT prophylaxis: Lovenox Code Status: Full Family Communication: No family in room, at the moment we are forbidden from discussing diagnosis with family members by the patient.  It looks like she has also forbidden this in the past when oncology tried to bring it up last winter. Disposition Plan: TBD Consults called: None Admission status: Admit to inpatient   Etta Quill DO [facility] Pager [phone]  If 7AM-7PM, please contact day team taking care of patient [email or web address] Password [email or web address]  09/25/2016, 3:48 AM

## 2016-09-26 DIAGNOSIS — L089 Local infection of the skin and subcutaneous tissue, unspecified: Secondary | ICD-10-CM

## 2016-09-26 MED ORDER — SULFAMETHOXAZOLE-TRIMETHOPRIM 800-160 MG PO TABS
1.0000 | ORAL_TABLET | Freq: Two times a day (BID) | ORAL | Status: DC
  Administered 2016-09-26 – 2016-09-27 (×2): 1 via ORAL
  Filled 2016-09-26 (×2): qty 1

## 2016-09-26 MED ORDER — ENSURE ENLIVE PO LIQD: 237.0000 mL | Freq: Three times a day (TID) | ORAL | 0 refills | Status: DC

## 2016-09-26 MED ORDER — EXEMESTANE 25 MG PO TABS
25.0000 mg | ORAL_TABLET | Freq: Every day | ORAL | Status: DC
  Administered 2016-09-26 – 2016-09-27 (×2): 25 mg via ORAL
  Filled 2016-09-26 (×2): qty 1

## 2016-09-26 MED ORDER — SULFAMETHOXAZOLE-TRIMETHOPRIM 800-160 MG PO TABS: 1.0000 | ORAL_TABLET | Freq: Two times a day (BID) | ORAL | 0 refills | Status: AC

## 2016-09-26 MED ORDER — SULFAMETHOXAZOLE-TRIMETHOPRIM 400-80 MG PO TABS: 1.0000 | ORAL_TABLET | Freq: Two times a day (BID) | ORAL | 0 refills | Status: DC

## 2016-09-26 MED ORDER — SULFAMETHOXAZOLE-TRIMETHOPRIM 800-160 MG PO TABS: 1.0000 | ORAL_TABLET | Freq: Two times a day (BID) | ORAL | Status: DC

## 2016-09-26 MED ORDER — SULFAMETHOXAZOLE-TRIMETHOPRIM 400-80 MG PO TABS
1.0000 | ORAL_TABLET | Freq: Two times a day (BID) | ORAL | Status: DC
  Administered 2016-09-26: 1 via ORAL
  Filled 2016-09-26: qty 1

## 2016-09-26 MED ORDER — EXEMESTANE 25 MG PO TABS: 25.0000 mg | ORAL_TABLET | Freq: Every day | ORAL | 0 refills | Status: DC

## 2016-09-26 NOTE — Care Management Note (Signed)
Contacted Dr. Lonny Prude and discussed d/c plan to a facility.

## 2016-09-26 NOTE — Care Management Note (Signed)
61 y.o.F with medical hx of BRCA. Initially diagnosed in May of last year. Unfortunately patient has not had chemo, radiation, nor surgery for BRCA up to this point, and instead has pursued homeopathic remedies. She has not informed her family of her diagnosis and has forbidden her physicians from doing so thus far. Sadly this doesn't appear to have been effective in treating the underlying cancer which has massively progressed at this point. Pt with fungating mass of breast that is grossly infected and foul smelling, metastatic axillary lymph nodes and lung.   Received call to assist pt with [facility]. Met with pt at bedside. She plans to return home with her son. Discussed HH RN and preference for a [place] agency. Provided pt with a list of [place] agencies. She chose [facility]. Contacted Jermaine at [place] for referral.  Met with pt and son. She asked her son to leave the room. She stated that she needs to go to a facility because they won't get an apartment until 2 weeks. They were staying at a hotel before she came to the hospital. Discussed SNF. She reports that she needs wound care. Informed her that the SW will f/u to assist with placement.  Met with RN and informed her that pt is now asking to go to a facility. Informed her that the SW will f/u to assist with SNF placement. Asked RN to notify MD.

## 2016-09-26 NOTE — Consult Note (Signed)
Referral MD  Reason for Referral: Metastatic breast cancer-ER positive   Chief Complaint  Patient presents with  . Abscess  : I have draining in my left breast.  HPI: Ann Oliver is a very nice 61 year old African-American female. She, has unfortunately, metastatic breast cancer that she does not understand that she has. She was diagnosed back in May 2017. She had a biopsy which showed adenocarcinoma. It was ER positive and PR negative. It was also HER-2 negative. Her family does not know that she has breast cancer. She saw [name] in December. He recommended additional tests be done. He started her on lectures all 2.5 mg a day. I'm not sure she is taking this. Pressure went to the emergency room in February. She had worsening draining of the left breast mass. She left AMA.  She now presents with worsening swelling and pain of the left breast. She had a CT angiogram done showed a fungating mass in the left lateral aspect of the breast. It measured 12.5 cm. It extends into the axilla. There are multiple enlarged lymph nodes. She has metastatic disease to her lungs.  She was started on antibiotics.  She was admitted. General surgery has seen her. They don't feel anything can be done for her from a surgical point of view.  She still does not understand fully the diagnosis. She does not want her family no one was going on. She wants to go home.  Her last visit she can check a calcium of 9.7 with an albumin of 4.0. Liver function tests were okay. She was slightly anemic with a hemoglobin of 11.  A CA 27.29 has not been done.  She probably needs to have a MRI of the brain make sure there is no brain metastasis.  She's lost weight. She is not sure which way she is lost. Her appetite is somewhat decreased.  She's having no pain. She is just complaining of the swelling and draining in the mass on the left breast.  Currently, I would say performance status is ECOG 1-2.    Past Medical  History:  Diagnosis Date  . Sinusitis   . Tumor cells   :  Past Surgical History:  Procedure Laterality Date  . BIOPSY BREAST    :   Current Facility-Administered Medications:  .  0.9 %  sodium chloride infusion, , Intravenous, Continuous, Etta Quill, DO, Last Rate: 125 mL/hr at 09/25/16 1058, 1,000 mL at 09/25/16 1058 .  acetaminophen (TYLENOL) tablet 650 mg, 650 mg, Oral, Q4H PRN, Schorr, Rhetta Mura, NP, 650 mg at 09/26/16 0907 .  enoxaparin (LOVENOX) injection 40 mg, 40 mg, Subcutaneous, Q24H, Gardner, Jared M, DO .  exemestane (AROMASIN) tablet 25 mg, 25 mg, Oral, QPC breakfast, Hulet Ehrmann, Rudell Cobb, MD .  feeding supplement (ENSURE ENLIVE) (ENSURE ENLIVE) liquid 237 mL, 237 mL, Oral, TID BM, [name], MD, 237 mL at 09/25/16 1955 .  fentaNYL (SUBLIMAZE) injection 50 mcg, 50 mcg, Intravenous, Once, Palumbo, April, MD .  MEDLINE mouth rinse, 15 mL, Mouth Rinse, BID, [name], MD, 15 mL at 09/25/16 2204 .  morphine 4 MG/ML injection 2-4 mg, 2-4 mg, Intravenous, Q4H PRN, Alcario Drought, Jared M, DO .  multivitamin with minerals tablet 1 tablet, 1 tablet, Oral, Daily, [name], MD, 1 tablet at 09/26/16 0907 .  sulfamethoxazole-trimethoprim (BACTRIM,SEPTRA) 400-80 MG per tablet 1 tablet, 1 tablet, Oral, Q12H, Nettey, Ralph A, MD:  . enoxaparin (LOVENOX) injection  40 mg Subcutaneous Q24H  .  exemestane  25 mg Oral QPC breakfast  . feeding supplement (ENSURE ENLIVE)  237 mL Oral TID BM  . fentaNYL (SUBLIMAZE) injection  50 mcg Intravenous Once  . mouth rinse  15 mL Mouth Rinse BID  . multivitamin with minerals  1 tablet Oral Daily  . sulfamethoxazole-trimethoprim  1 tablet Oral Q12H  :  Allergies  Allergen Reactions  . Aspirin Palpitations  :  No family history on file.:  Social History   Social History  . Marital status: Single    Spouse name: N/A  . Number of children: N/A  . Years of education: N/A   Occupational History  . Not on file.   Social  History Main Topics  . Smoking status: Never Smoker  . Smokeless tobacco: Never Used  . Alcohol use No  . Drug use: No  . Sexual activity: Not on file   Other Topics Concern  . Not on file   Social History Narrative  . No narrative on file  :  Pertinent items are noted in HPI.  Exam: Patient Vitals for the past 24 hrs:  BP Temp Temp src Pulse Resp SpO2  09/26/16 0558 (!) 118/59 98 F (36.7 C) Oral (!) 106 20 97 %  09/25/16 2154 113/72 98.2 F (36.8 C) Oral 91 18 100 %  09/25/16 1609 112/74 98.3 F (36.8 C) Oral 95 17 100 %    As above    Recent Labs  09/25/16 0105 09/25/16 0131  WBC 10.0  --   HGB 11.0* 11.9*  HCT 33.9* 35.0*  PLT 452*  --     Recent Labs  09/25/16 0118 09/25/16 0131  NA 139 138  K 3.4* 3.6  CL 102 103  CO2 26  --   GLUCOSE 118* 115*  BUN 16 17  CREATININE 0.88 0.90  CALCIUM 9.7  --     Blood smear review:  None  Pathology: None     Assessment and Plan:  Ann Oliver is a 61 year old African-American female. She has metastatic breast cancer. She has a strong element of denial. She may understand that she has a cancer in the left breast. I'm not sure she excepts that this has spread.  I doubt that she's been taking the letrozole.  She has a large tumor. It is advanced. It is aggressive.  I think she probably would need chemotherapy. I don't believe that she would except to take chemotherapy.  I discussed with her about undergoing some additional tests. I'm not sure she will agree to this. I do think an MRI of the brain would be helpful.  Again, she just wants to go home. I do still believe she understands what is happening with her or the risk that she is taking by not taking treatment. I would think that she would respond well to treatment. I would think that chemotherapy probably would be best. However, I sincerely doubt that she would agree.  We cannot talk to her family about this. She does not want them knowing what is going  on.  This is a very tough situation. I don't think there is a obvious "right or wrong answer". Hopefully she will take Aromasin. I will try her on this.  We will see what her tumor marker level is.  She has a very strong faith. She feels that God will take care of her.  We will follow along as much as possible.  Lattie Haw, MD  Proverbs 3:5-6

## 2016-09-26 NOTE — Discharge Summary (Addendum)
Physician Discharge Summary  Ann Oliver YKZ:[id number] DOB: May 10, 1956 DOA: 09/25/2016  PCP: [facility]  Admit date: 09/25/2016 Discharge date: 09/27/2016  Admitted From: Home Disposition: Home  Recommendations for Outpatient Follow-up:  1. Follow up with PCP in 1 week 2. Follow up with [name] in 1 week 3. Wet to dry dressing changes for breast mass 4. Please follow up on the following pending results: Blood cultures  Home Health: RN  Discharge Condition: Guarded CODE STATUS: Full code Diet recommendation: Heart healthy   Brief/Interim Summary:  Admission HPI written by Etta Quill, DO   Chief Complaint: Breast "abscess"  HPI: Ann Oliver is a 61 y.o. female with medical history significant of BRCA initially diagnosed in May of last year.  Unfortunately patient has not had chemo, radiation, nor surgery for BRCA up to this point, and instead has pursued homeopathic remedies.  She has not informed her family of her diagnosis and has forbidden her physicians from doing so thus far.  Sadly this doesn't appear to have been effective in treating the underlying cancer which has massively progressed at this point.   ED Course: Patient with fungating mass of breast that is grossly infected and foul smelling, 12.5cm tumor on CT scan, metastatic axillary lymph nodes, mets to lung.  Patient started on zosyn and vanc.  We are again forbidden to tell her family anything though she does agree to admission.    Hospital course:  Fungating, necrotic infected breast mass No evidence of Abscess from CT scan. Mass involves left breast and axilla. Initially started on vancomycin and Zosyn. This was discontinued and patient started on Bactrim. General surgery evaluated the patient and recommended twice-daily wet-to-dry changes.  Left breast cancer Patient was previously followed by Dr. Franklyn Lor as an outpatient but has had limited follow-up. It appears she is undergoing  alternative therapy for this, including prayer. She states that she will follow up with oncology for consideration of chemotherapy. On CT scan, there is evidence of pulmonary metastases. Patient was evaluated by psychiatry and patient currently has capacity to make her decisions.  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 Start taking on:  09/27/2016   feeding supplement (ENSURE ENLIVE) Liqd Take 237 mLs by mouth 3 (three) times daily between meals.   sulfamethoxazole-trimethoprim 800-160 MG tablet Commonly known as:  BACTRIM DS,SEPTRA DS Take 1 tablet by mouth every 12 (twelve) hours.      Follow-up Information    [facility], [facility]. Schedule an appointment as soon as possible for a visit in 1 week(s).   Specialty:  [facility] Contact information: [address] [place] [address] [phone]        Nicholas Lose, MD. Schedule an appointment as soon as possible for a visit in 1 week(s).   Specialty:  Hematology and Oncology Contact information: [place] [address] [phone]  Allergies  Allergen Reactions  . Aspirin Palpitations    Consultations:  General surgery  Oncology   Procedures/Studies: Ct Angio Chest Pe W Or Wo Contrast  Result Date: 09/25/2016 CLINICAL DATA:  Tachycardia left axillary mass history of breast cancer EXAM: CT ANGIOGRAPHY CHEST WITH CONTRAST TECHNIQUE:  Multidetector CT imaging of the chest was performed using the standard protocol during bolus administration of intravenous contrast. Multiplanar CT image reconstructions and MIPs were obtained to evaluate the vascular anatomy. CONTRAST:  100 mL Isovue 370 intravenous COMPARISON:  Chest CT 05/06/2016 FINDINGS: Cardiovascular: Satisfactory opacification of the pulmonary arteries to the segmental level. No evidence of pulmonary embolism. Normal heart size. No pericardial effusion. Non aneurysmal aorta. No dissection. Mild atherosclerotic calcification. Mediastinum/Nodes: Midline trachea. 1.8 cm low-density nodule in the right lobe of the thyroid. No significantly enlarged mediastinal lymph nodes. There are small hilar nodes. Esophagus within normal limits. Multiple enlarged left axillary lymph nodes, increased in size, largest lymph node measures 3 x 1.8 cm. Lungs/Pleura: Multiple pulmonary nodules consistent with metastatic disease. There are multiple new pulmonary nodules in addition to enlargement of previously visualized pulmonary nodules. The largest nodule is visualized in the lingula and measures 1.6 cm in diameter. No pleural effusion or acute pulmonary infiltrate. Upper Abdomen: The previously noted vague hypodense liver mass is non included in the field of view. Musculoskeletal: No suspicious bony lesions. Interval increase in size of a left lateral breast mass that extends into the left axilla, this measures 12 x 8.4 by 12.5 cm, compared with prior measurements of 5.1 x 5 x 6.2 cm. Mass appears adherent to the left pectoralis musculature laterally. No gross bony invasion of the thoracic skeleton. Review of the MIP images confirms the above findings. IMPRESSION: 1. Negative for acute pulmonary embolus or aortic dissection 2. Significant enlargement of a left lateral fungating breast mass that extends into the axilla, now measuring 12.5 cm in maximum diameter. Interval enlargement of multiple suspicious  axillary lymph nodes. 3. Increased number and size of multiple pulmonary nodules consistent with metastatic disease. Small hilar nodes also suspicious for metastatic disease. 4. 1.8 cm low-density nodule in the right lobe of the thyroid. Electronically Signed   By: Donavan Foil M.D.   On: 09/25/2016 02:28      Subjective: Patient reports no pain, nausea or vomiting. She states that she is undergoing therapy but does not wish to share with me what kind of therapy she is receiving.  Discharge Exam: Vitals:   09/25/16 2154 09/26/16 0558  BP: 113/72 (!) 118/59  Pulse: 91 (!) 106  Resp: 18 20  Temp: 98.2 F (36.8 C) 98 F (36.7 C)   Vitals:   09/25/16 0506 09/25/16 1609 09/25/16 2154 09/26/16 0558  BP: (!) 141/69 112/74 113/72 (!) 118/59  Pulse: 87 95 91 (!) 106  Resp: 16 17 18 20   Temp: 98 F (36.7 C) 98.3 F (36.8 C) 98.2 F (36.8 C) 98 F (36.7 C)  TempSrc: Oral Oral Oral Oral  SpO2: 100% 100% 100% 97%  Weight: 59.1 kg (130 lb 3.2 oz)     Height: 5' 2"  (1.575 m)       General: Pt is alert, awake, not in acute distress Cardiovascular: RRR, S1/S2 +, no rubs, no gallops Respiratory: CTA bilaterally, no wheezing, no rhonchi Abdominal: Soft, NT, ND, bowel sounds + Extremities: no edema, no cyanosis Skin: Dressing overlying chest wound is clean and intact    The results of significant diagnostics from this hospitalization (including imaging,  microbiology, ancillary and laboratory) are listed below for reference.     Microbiology: Recent Results (from the past 240 hour(s))  Blood culture (routine x 2)     Status: None (Preliminary result)   Collection Time: 09/25/16  1:18 AM  Result Value Ref Range Status   Specimen Description BLOOD RIGHT AC  Final   Special Requests BOTTLES DRAWN AEROBIC AND ANAEROBIC BCAV  Final   Culture   Final    NO GROWTH 1 DAY Performed at [facility], [address] [place], [place] [address]    Report Status PENDING  Incomplete   Blood culture (routine x 2)     Status: None (Preliminary result)   Collection Time: 09/25/16  1:18 AM  Result Value Ref Range Status   Specimen Description BLOOD RIGHT UA  Final   Special Requests BOTTLES DRAWN AEROBIC AND ANAEROBIC BCAV  Final   Culture   Final    NO GROWTH 1 DAY Performed at [facility], [place] [address] [place], [place] [address]    Report Status PENDING  Incomplete     Labs: BNP (last 3 results) No results for input(s): BNP in the last 8760 hours. Basic Metabolic Panel:  Recent Labs Lab 09/25/16 0118 09/25/16 0131  NA 139 138  K 3.4* 3.6  CL 102 103  CO2 26  --   GLUCOSE 118* 115*  BUN 16 17  CREATININE 0.88 0.90  CALCIUM 9.7  --    Liver Function Tests:  Recent Labs Lab 09/25/16 0118  AST 29  ALT 19  ALKPHOS 74  BILITOT 0.3  PROT 7.9  ALBUMIN 4.0   No results for input(s): LIPASE, AMYLASE in the last 168 hours. No results for input(s): AMMONIA in the last 168 hours. CBC:  Recent Labs Lab 09/25/16 0105 09/25/16 0131  WBC 10.0  --   NEUTROABS 7.7  --   HGB 11.0* 11.9*  HCT 33.9* 35.0*  MCV 83.9  --   PLT 452*  --    Urinalysis    Component Value Date/Time   COLORURINE YELLOW 09/25/2016 0230   APPEARANCEUR CLEAR 09/25/2016 0230   LABSPEC 1.019 09/25/2016 0230   PHURINE 5.0 09/25/2016 0230   GLUCOSEU NEGATIVE 09/25/2016 0230   HGBUR NEGATIVE 09/25/2016 0230   BILIRUBINUR NEGATIVE 09/25/2016 0230   KETONESUR 5 (A) 09/25/2016 0230   PROTEINUR NEGATIVE 09/25/2016 0230   NITRITE NEGATIVE 09/25/2016 0230   LEUKOCYTESUR NEGATIVE 09/25/2016 0230   Sepsis Labs Invalid input(s): PROCALCITONIN,  WBC,  LACTICIDVEN Microbiology Recent Results (from the past 240 hour(s))  Blood culture (routine x 2)     Status: None (Preliminary result)   Collection Time: 09/25/16  1:18 AM  Result Value Ref Range Status   Specimen Description BLOOD RIGHT AC  Final   Special Requests BOTTLES DRAWN AEROBIC AND ANAEROBIC BCAV  Final   Culture    Final    NO GROWTH 1 DAY Performed at [facility], [place] [address] [place], [place] [address]    Report Status PENDING  Incomplete  Blood culture (routine x 2)     Status: None (Preliminary result)   Collection Time: 09/25/16  1:18 AM  Result Value Ref Range Status   Specimen Description BLOOD RIGHT UA  Final   Special Requests BOTTLES DRAWN AEROBIC AND ANAEROBIC BCAV  Final   Culture   Final    NO GROWTH 1 DAY Performed at [facility], [place] [address] [place], [place]  [address]    Report Status PENDING  Incomplete     Time coordinating discharge: Over 30 minutes  SIGNED:   Cordelia Poche, MD [facility] 09/26/2016, 11:48 AM Pager [phone]  If 7PM-7AM, please contact night-coverage [email or web address] Password [email or web address]

## 2016-09-27 ENCOUNTER — Inpatient Hospital Stay (HOSPITAL_COMMUNITY): Payer: Medicaid Other

## 2016-09-27 DIAGNOSIS — E440 Moderate protein-calorie malnutrition: Secondary | ICD-10-CM

## 2016-09-27 DIAGNOSIS — K219 Gastro-esophageal reflux disease without esophagitis: Secondary | ICD-10-CM

## 2016-09-27 DIAGNOSIS — Z7189 Other specified counseling: Secondary | ICD-10-CM

## 2016-09-27 DIAGNOSIS — C50919 Malignant neoplasm of unspecified site of unspecified female breast: Secondary | ICD-10-CM

## 2016-09-27 DIAGNOSIS — C50412 Malignant neoplasm of upper-outer quadrant of left female breast: Principal | ICD-10-CM

## 2016-09-27 DIAGNOSIS — Z79811 Long term (current) use of aromatase inhibitors: Secondary | ICD-10-CM

## 2016-09-27 DIAGNOSIS — Z515 Encounter for palliative care: Secondary | ICD-10-CM

## 2016-09-27 DIAGNOSIS — Z170 Estrogen receptor positive status [ER+]: Secondary | ICD-10-CM

## 2016-09-27 DIAGNOSIS — Z590 Homelessness: Secondary | ICD-10-CM

## 2016-09-27 DIAGNOSIS — C7800 Secondary malignant neoplasm of unspecified lung: Secondary | ICD-10-CM

## 2016-09-27 DIAGNOSIS — C50911 Malignant neoplasm of unspecified site of right female breast: Secondary | ICD-10-CM

## 2016-09-27 NOTE — Progress Notes (Signed)
PT NOTE: PT order received.  Pt up in room on arrival and agreeable to ambulate with PT.  Pt ambulated 52' sans AD and without assist.  No instability noted, no loss of balance and with intact safety awareness.  Pt with no PT needs identified at this time - pt and RN in agreement.  Pt screen, no charge.  PT services to be dc at this time.  Please reorder if pt needs change.

## 2016-09-27 NOTE — Progress Notes (Signed)
Chief complaint: Breast "abscess"  Today patient states that she has no pain in the area of her fungating mass. Afebrile overnight.  BP 115/76 (BP Location: Left Arm)   Pulse 85   Temp 98.3 F (36.8 C) (Oral)   Resp 16   Ht 5\' 2"  (1.575 m)   Wt 59.1 kg (130 lb 3.2 oz)   SpO2 100%   BMI 23.81 kg/m   General: well appearing, no distress  Assessment/Plan Continue Bactrim for fungating mass infection. PT evaluation for disposition. Anticipate discharge to skilled nursing facility.  Prophylaxis: Lovenox  Cordelia Poche, MD [facility] 09/27/2016, 12:50 PM Pager: [phone]

## 2016-09-27 NOTE — Care Management Note (Signed)
Case Management Note  Patient Details  Name: Theodore Rahrig MRN: [id number] Date of Birth: 1955-12-03  Subjective/Objective:                   Patientwith medical history significant of BRCA initially diagnosed in May of last year. Unfortunately patient has not had chemo, radiation, nor surgery for BRCA up to this point, and instead has pursued homeopathic remedies. She has not informed her family of her diagnosis and has forbidden her physicians from doing so thus far.    Action/Plan: Spoke with Kendell Bane ([place]) discussed discharge plan has changed to temporary home at extended stay hotel until housing finalized from skilled nursing facility placement.  Butch Penny states patient does not meet criteria for SNF placement and requested RNCM's assistance with alterative discharge plan with home health.  Referral for home health bid dressing changes called to [facility] [phone]).    Butch Penny will communicate updated discharge plan to patient's nurse and patient.  Patient's nurse will update patient's MD on updated discharge plan and obtain specific wound care orders for discharge.  No additional CM needs at this time.    Expected Discharge Date:  09/27/16               Expected Discharge Plan:  [place] (Patient discharged to hotel until new housing available.)  In-House Referral:     Discharge planning Services  CM Consult  Post Acute Care Choice:   Choice offered to:    DME Arranged:    DME Agency:     HH Arranged:    [place] Agency:  [place]  Status of Service:     If discussed at [place] of Stay Meetings, dates discussed:    Additional Comments:  Serena Colonel, RN 09/27/2016, 8:30 PM

## 2016-09-27 NOTE — Consult Note (Signed)
Consultation Note Date: 09/27/2016   Patient Name: Ann Oliver  DOB: [birth year]  MRN: [id number]  Age / Sex: 61 y.o., female  PCP: [facility], [facility] Referring Physician: [name], MD  Reason for Consultation: Establishing goals of care  HPI/Patient Profile: 61 y.o. female  with past medical history of breast cancer admitted on 09/25/2016 with breast mass.   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 Patient decided not to do anything with the cancer and was lost to follow-up. She noticed a left breast mass in 04/2016 and went to the ED where CT of the chest detected metastatic disease to the lungs and liver. She again followed up with [name] 04/2016 who recommended biopsy of mass, PET/CT scan, MRI brain, and to start antiestrogen therapy with letrozole 2.5 mg by mouth daily; patient again did not follow up. She went to the ED in February of this year complaining of draining/increased left breast mass; when general surgery consult was recommended she decided to leave AMA. Patient again returned to ED on 09-25-16 with the same complaint of enlarging/draining breast mass. CT angio showed left lateral fungating breast mass that extends into the axilla (12.5 cm in maximum diameter) with multiple enlarged axillary lymph nodes; metastatic disease seen in lungs.   Patient has since been admitted to the hospital medicine service, she has been seen by general surgery, medical oncology and psychiatry. Surgery has recommended wound care, wet to dry dressing changes daily, psychiatry has deemed her capable of making her own decisions. She has been started on Aromasin by oncology, she  was supposed to go home with her son on 09-26-16. However, reportedly was living in a motel and does not have permanent living accommodations at this time. A palliative consult has been placed for goals of care discussions.   The patient is sitting up in her bed, there is no family at the bedside. I introduced myself and palliative care as follows: Palliative medicine is specialized medical care for people living with serious illness. It focuses on providing relief from the symptoms and stress of a serious illness. The goal is to improve quality of life for both the patient and the family.  Patient states she lives in [place], she has a son who lives locally, she talks a lot about her faith, when I tried to bring up discussions about advanced directives, goals/wishes etc, patient wished to end her conversaion with me, she asked for me to follow up on when she will be leaving the hospital to go to a facility. I then left the room. See recommendations below.   NEXT OF KIN  has 5 sons, 3 live in [place], one lives in [place], [place].   SUMMARY OF RECOMMENDATIONS    full code Recommend SNF rehab with palliative Appreciate Dr Marin Olp  input, I have discussed with the patient about following up with medical oncology on a regular basis.  Patient declines my offer to discuss with her family about her diagnosis Continue current mode of care for now.  Guarded prognosis, largely limited by the patient's limited insight into the serious nature of her illness  Code Status/Advance Care Planning:  Full code    Symptom Management:    continue current mode of care.   Palliative Prophylaxis:   Delirium Protocol  Psycho-social/Spiritual:   Desire for further Chaplaincy support:no  Additional Recommendations: Caregiving  Support/Resources  Prognosis:   guarded  Discharge Planning: [place] for rehab with Palliative care service follow-up      Primary Diagnoses: Present on  Admission: . 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 Breast cancer of upper-outer quadrant of left female breast ([place]) . GERD   I have reviewed the medical record, interviewed the patient and family, and examined the patient. The following aspects are pertinent.  Past Medical History:  Diagnosis Date  . Sinusitis   . Tumor cells    Social History   Social History  . Marital status: Single    Spouse name: N/A  . Number of children: N/A  . Years of education: N/A   Social History Main Topics  . Smoking status: Never Smoker  . Smokeless tobacco: Never Used  . Alcohol use No  . Drug use: No  . Sexual activity: Not Asked   Other Topics Concern  . None   Social History Narrative  . None   No family history on file. Scheduled Meds: . enoxaparin (LOVENOX) injection  40 mg Subcutaneous Q24H  . exemestane  25 mg Oral QPC breakfast  . feeding supplement (ENSURE ENLIVE)  237 mL Oral TID BM  . fentaNYL (SUBLIMAZE) injection  50 mcg Intravenous Once  . mouth rinse  15 mL Mouth Rinse BID  . multivitamin with minerals  1 tablet Oral Daily  . sulfamethoxazole-trimethoprim  1 tablet Oral Q12H   Continuous Infusions: PRN Meds:.acetaminophen, morphine injection Medications Prior to Admission:  Prior to Admission medications   Medication Sig Start Date End Date Taking? Authorizing Provider  exemestane (AROMASIN) 25 MG tablet Take 1 tablet (25 mg total) by mouth daily after breakfast. 09/27/16   [name], MD  feeding supplement, ENSURE ENLIVE, (ENSURE ENLIVE) LIQD Take 237 mLs by mouth 3 (three) times daily between meals. 09/26/16   [name], MD  letrozole [facility]) 2.5 MG tablet Take 1 tablet (2.5 mg total) by mouth daily. Patient not taking: Reported on 09/25/2016 06/09/16   Nicholas Lose, MD  sulfamethoxazole-trimethoprim (BACTRIM DS,SEPTRA DS) 800-160 MG tablet Take 1 tablet by mouth every 12 (twelve) hours. 09/26/16 10/05/16  [name], MD   Allergies    Allergen Reactions  . Aspirin Palpitations   Review of Systems Denies pain  Physical Exam Patient declined: Appears comfortable Is thin, cachectic appearing Images from breast mass noted on general surgery notes No edema Awake alert non focal Is some what hyper verbal in her speech   Vital Signs: BP 115/76 (BP Location: Left Arm)   Pulse 85   Temp 98.3 F (36.8 C) (Oral)   Resp 16   Ht 5' 2"  (1.575 m)   Wt 59.1 kg (130 lb 3.2 oz)   SpO2 100%   BMI 23.81 kg/m  Pain Assessment: 0-10 POSS *See Group Information*: 1-Acceptable,Awake and alert Pain Score: 2    SpO2: SpO2: 100 % O2 Device:SpO2:  100 % O2 Flow Rate: .   IO: Intake/output summary:  Intake/Output Summary (Last 24 hours) at 09/27/16 1018 Last data filed at 09/27/16 5826  Gross per 24 hour  Intake             1970 ml  Output              750 ml  Net             1220 ml    LBM: Last BM Date: 09/26/16 Baseline Weight: Weight: 59 kg (130 lb 1.1 oz) Most recent weight: Weight: 59.1 kg (130 lb 3.2 oz)     Palliative Assessment/Data:   Flowsheet Rows     Most Recent Value  Intake Tab  Referral Department  Hospitalist  Unit at Time of Referral  Oncology Unit  Palliative Care Primary Diagnosis  Cancer  Palliative Care Type  New Palliative care  Reason for referral  Clarify Goals of Care  Date first seen by Palliative Care  09/27/16  Clinical Assessment  Palliative Performance Scale Score  40%  Pain Max last 24 hours  2  Pain Min Last 24 hours  1  Dyspnea Max Last 24 Hours  2  Dyspnea Min Last 24 hours  1  Nausea Max Last 24 Hours  1  Nausea Min Last 24 Hours  0  Anxiety Max Last 24 Hours  3  Anxiety Min Last 24 Hours  2  Psychosocial & Spiritual Assessment  Palliative Care Outcomes  Patient/Family meeting held?  Yes  Who was at the meeting?  patient   Palliative Care Outcomes  Clarified goals of care      Time In:  9.30 Time Out:  10.30 Time Total:   60 min  Greater than 50%  of this time  was spent counseling and coordinating care related to the above assessment and plan.  Signed by: Loistine Chance, MD  [phone]  Please contact Palliative Medicine Team phone at [phone] for questions and concerns.  For individual provider: See Shea Evans

## 2016-09-27 NOTE — Progress Notes (Signed)
Pt left with her son before I went over discharge instructions with her, They returned a little while later when she realized she didn't receive any papers. Explained discharge instructions to pt and gave her prescriptions. [facility] is going to call her cell phone every day to find out where she's at so they can change her dressing to her LT breast once a day. Pt understands this. Left with son

## 2016-09-27 NOTE — Clinical Social Work Note (Signed)
SW contacted this afternoon to speak to patient about d/c disposition re: homelessness.  Patient wants placement for 1-2 weeks until she and her son can get an apartment. While she needs daily dressing changes to wound on her breast- she is able to ambulate independently > 450 feet.  She does not meet SNF criteria. Pt's situation reviewed with Ann Oliver, Assistant Director of Clinical SW.  She is not wanting to commit to a 30+ stay in an assisted living nor give her check to the facility. States she gets $750.00 SSI.  Patient has 5 sons but does not want any of them to know of her cancer diagnosis.  She is adamant about this and states "I'll tell them sometime."  With her permission- SW spoke with son Ann Oliver [phone]) (c) whom she has been staying with in a hotel room.  Per son- they will be getting an apartment in approximately 1 week- no later than the 15th of the month.  They are currently staying at the Extended [facility] on [address]- however he states he looks up the cheapest rates day to day and they move.  Discussed with Ann Oliver, RNCM who spoke with Ann Oliver at [facility]- notified them of above and [facility] was provided with patient's cell number [phone].  They will call patient to determine each day where she is and then go to her for daily dressing changes to her left breast.  Above discussed with her attending RN- Ann Oliver.  Son Ann Oliver states that they have a car and he will come and pick her up.  Pt is having some difficulty with above explanation and continues to state "can I go to a nursing home in a few weeks?"  She still wants to remain with her son.  She is also going to consider talking to one of her sons in [place] but is undecided. Pt is aware of d/c today.  No further SW intervention indicated at this time. Nursing will complete d/c arrangements with patient.

## 2016-09-27 NOTE — Progress Notes (Signed)
Apparently, there is an issue with her going home. The issue is that she has no home to go to. She apparently is in a motel room. This will provide case management a very tricky situation. She does not want her family knowing what is going on with her. She cannot have nursing care come into the home since she really has no home.  She will not allow [place] to do any additional testing on her. She still has a strong faith.  I have her on Aromasin.  The CA 27.29 is pending.  So far, cultures are negative. She is HIV negative.  She does not seem to be hurting. There is no cough. There's no shortness of breath. She's had no nausea or vomiting. There has been no fever.  On her physical exam, there really is no change. The left breast masses dressed. The dressing is somewhat wet from discharge.  Ms. Persad has metastatic breast cancer. She is on Aromasin. At least while she is in the hospital, she will be taking this. Whether or not she takes it as an out patient is a whole another issue.  I'm unsure how long it will take to find her a place to live.  She still has a large element of denial last to what is going on.  Lattie Haw, MD  Psalm 64:7

## 2016-09-29 LAB — CANCER ANTIGEN 27.29: CA 27.29: 318.8 U/mL — ABNORMAL HIGH (ref 0.0–38.6)

## 2016-09-30 LAB — CULTURE, BLOOD (ROUTINE X 2)
CULTURE: NO GROWTH
Culture: NO GROWTH

## 2016-09-30 NOTE — Assessment & Plan Note (Addendum)
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  Plan: 1. Palliative treatment with Taxol every 3 weeks I discussed risks and benefits of Taxol including the risk of allergy/anaphylaxis, neuropathy risk, decrease in blood counts and risk of infection, liver and kidney function changes, fatigue, loss of taste and hair loss.  Patient will need the following 1. PET/CT scan 2. MRI brain 3. Chemotherapy with Taxol to start in 2 weeks 4. IR for port placement 5. Chemotherapy class

## 2016-10-01 ENCOUNTER — Ambulatory Visit (HOSPITAL_BASED_OUTPATIENT_CLINIC_OR_DEPARTMENT_OTHER): Payer: Medicaid Other | Admitting: Hematology and Oncology

## 2016-10-01 VITALS — BP 146/70 | HR 99 | Temp 98.2°F | Ht 62.0 in | Wt 117.6 lb

## 2016-10-01 DIAGNOSIS — Z170 Estrogen receptor positive status [ER+]: Secondary | ICD-10-CM

## 2016-10-01 DIAGNOSIS — Z7189 Other specified counseling: Secondary | ICD-10-CM

## 2016-10-01 DIAGNOSIS — C773 Secondary and unspecified malignant neoplasm of axilla and upper limb lymph nodes: Secondary | ICD-10-CM | POA: Diagnosis not present

## 2016-10-01 DIAGNOSIS — C7800 Secondary malignant neoplasm of unspecified lung: Secondary | ICD-10-CM

## 2016-10-01 DIAGNOSIS — C50412 Malignant neoplasm of upper-outer quadrant of left female breast: Secondary | ICD-10-CM | POA: Diagnosis not present

## 2016-10-01 DIAGNOSIS — C50911 Malignant neoplasm of unspecified site of right female breast: Secondary | ICD-10-CM

## 2016-10-01 DIAGNOSIS — R253 Fasciculation: Secondary | ICD-10-CM | POA: Diagnosis not present

## 2016-10-01 DIAGNOSIS — G514 Facial myokymia: Secondary | ICD-10-CM

## 2016-10-01 MED ORDER — TRAMADOL HCL 50 MG PO TABS: 50.0000 mg | ORAL_TABLET | Freq: Four times a day (QID) | ORAL | 0 refills | Status: DC | PRN

## 2016-10-01 MED ORDER — PROCHLORPERAZINE MALEATE 10 MG PO TABS: 10.0000 mg | ORAL_TABLET | Freq: Four times a day (QID) | ORAL | 1 refills | Status: DC | PRN

## 2016-10-01 MED ORDER — ONDANSETRON HCL 8 MG PO TABS: 8.0000 mg | ORAL_TABLET | Freq: Two times a day (BID) | ORAL | 1 refills | Status: DC | PRN

## 2016-10-01 MED ORDER — LIDOCAINE-PRILOCAINE 2.5-2.5 % EX CREA: TOPICAL_CREAM | CUTANEOUS | 3 refills | Status: DC

## 2016-10-01 MED FILL — traMADol HCL 50 MG TABS: 50 | 22 days supply | Qty: 90 | Fill #0

## 2016-10-01 NOTE — Progress Notes (Signed)
START ON PATHWAY REGIMEN - Breast     A cycle is every 28 days (3 weeks on and 1 week off):     Paclitaxel   **Always confirm dose/schedule in your pharmacy ordering system**    Patient Characteristics: Metastatic Chemotherapy, HER2 Negative/Unknown/Equivocal, ER +, First Line Therapeutic Status: Distant Metastases BRCA Mutation Status: Absent ER Status: Positive (+) HER2 Status: Negative (-) Would you be surprised if this patient died  in the next year? I would be surprised if this patient died in the next year PR Status: Positive (+) Line of therapy: First Line  Intent of Therapy: Non-Curative / Palliative Intent, Discussed with Patient

## 2016-10-01 NOTE — Progress Notes (Signed)
Patient Care Team: Care, [place] as PCP - General [facility])  DIAGNOSIS:  Encounter Diagnoses  Name Primary?  . Malignant neoplasm of upper-outer quadrant of left breast in female, estrogen receptor positive ([place])   . Facial twitching Yes  . Malignant neoplasm metastatic to lung, unspecified laterality ([place])     SUMMARY OF ONCOLOGIC HISTORY:   Breast cancer of upper-outer quadrant of left female breast ([place])   09/26/2015 Mammogram    Left breast 2:00 position suspicious mass 3.1 x 1.8 x 2.8 cm, indeterminate group of calcifications UIQ left breast needs stereotactic biopsy      10/22/2015 Initial Diagnosis    Left breast biopsy UOQ: Invasive adenocarcinoma, grade 2-3, EF 20%, PR 0%, Ki-67 30%, left breast UIQ: Fibroadenoma with extensive calcifications       Miscellaneous    Patient decided not to get any interventions and was lost to follow-up      05/06/2016 Imaging    CT chest: Heterogeneous necrotic mass left chest wall and UOQ 6.2 cm extending into axilla, ext into pectoralis major and to skin multiple enlarged left axillary LN, multiple more than 15 lung nodules, 1.5 cm lesion right lobe of the liver      09/25/2016 - 09/27/2016 Hospital Admission    Breast abscess (fungating tumor) 12.5 cm with axillary LN mets, mets to lungs       CHIEF COMPLIANT: Follow-up of recent hospitalization with metastatic breast cancer  INTERVAL HISTORY: Ann Oliver is a 61 year old with above-mentioned history of fungating left breast mass which was originally diagnosed a year ago but she was lost to follow-up. She presented in December with a CT chest showing multiple lung nodules and a liver lesion. I had prescribed her aromatase inhibitor therapy but she had not taken it. She presented back to the hospital with the suspicion of left breast abscess. CT of the chest was performed which revealed 12.5 cm fungating tumor with bulky axillary lymph node involvement along with  metastatic disease to the lungs. She was sent to me for further evaluation and discussion of treatment. She is here on by herself. She tells me that she has not discussed this with any of her family members. It is unclear if she has good support at home. She is however interested in pursuing with treatment options.  REVIEW OF SYSTEMS:   Constitutional: Denies fevers, chills or abnormal weight loss Eyes: Denies blurriness of vision Ears, nose, mouth, throat, and face: Denies mucositis or sore throat Respiratory: Denies cough, dyspnea or wheezes Cardiovascular: Denies palpitation, chest discomfort Gastrointestinal:  Denies nausea, heartburn or change in bowel habits Skin: Denies abnormal skin rashes Lymphatics: Denies new lymphadenopathy or easy bruising Neurological: Patient appears to have some twitching sensation on the face as well as slightly garbled speech Behavioral/Psych: Mood is stable, no new changes  Extremities: No lower extremity edema Breast: fungating left breast mass All other systems were reviewed with the patient and are negative.  I have reviewed the past medical history, past surgical history, social history and family history with the patient and they are unchanged from previous note.  ALLERGIES:  is allergic to aspirin.  MEDICATIONS:  Current Outpatient Prescriptions  Medication Sig Dispense Refill  . feeding supplement, ENSURE ENLIVE, (ENSURE ENLIVE) LIQD Take 237 mLs by mouth 3 (three) times daily between meals. 30 Bottle 0  . sulfamethoxazole-trimethoprim (BACTRIM DS,SEPTRA DS) 800-160 MG tablet Take 1 tablet by mouth every 12 (twelve) hours. 18 tablet 0  . traMADol (  ULTRAM) 50 MG tablet Take 1 tablet (50 mg total) by mouth every 6 (six) hours as needed. 90 tablet 0   No current facility-administered medications for this visit.     PHYSICAL EXAMINATION: ECOG PERFORMANCE STATUS: 2 - Symptomatic, <50% confined to bed  Vitals:   10/01/16 1101  BP: (!) 146/70    Pulse: 99  Temp: 98.2 F (36.8 C)   Filed Weights   10/01/16 1101  Weight: 117 lb 9.6 oz (53.3 kg)    GENERAL:alert, no distress and comfortable SKIN: skin color, texture, turgor are normal, no rashes or significant lesions EYES: normal, Conjunctiva are pink and non-injected, sclera clear OROPHARYNX:no exudate, no erythema and lips, buccal mucosa, and tongue normal  NECK: supple, thyroid normal size, non-tender, without nodularity LYMPH:  no palpable lymphadenopathy in the cervical, axillary or inguinal LUNGS: clear to auscultation and percussion with normal breathing effort HEART: regular rate & rhythm and no murmurs and no lower extremity edema ABDOMEN:abdomen soft, non-tender and normal bowel sounds MUSCULOSKELETAL:no cyanosis of digits and no clubbing  NEURO: alert & oriented x 3 with fluent speech, no focal motor/sensory deficits EXTREMITIES: No lower extremity edema  LABORATORY DATA:  I have reviewed the data as listed   Chemistry      Component Value Date/Time   NA 138 09/25/2016 0131   K 3.6 09/25/2016 0131   CL 103 09/25/2016 0131   CO2 26 09/25/2016 0118   BUN 17 09/25/2016 0131   CREATININE 0.90 09/25/2016 0131      Component Value Date/Time   CALCIUM 9.7 09/25/2016 0118   ALKPHOS 74 09/25/2016 0118   AST 29 09/25/2016 0118   ALT 19 09/25/2016 0118   BILITOT 0.3 09/25/2016 0118       Lab Results  Component Value Date   WBC 10.0 09/25/2016   HGB 11.9 (L) 09/25/2016   HCT 35.0 (L) 09/25/2016   MCV 83.9 09/25/2016   PLT 452 (H) 09/25/2016   NEUTROABS 7.7 09/25/2016    ASSESSMENT & PLAN:  Breast cancer of upper-outer quadrant of left female breast ([place]) 09/26/2015 Left breast 2:00 position suspicious mass 3.1 x 1.8 x 2.8 cm, indeterminate group of calcifications UIQ left breast needs stereotactic biopsy Left breast biopsy UOQ 10/22/2015: Invasive adenocarcinoma, grade 2-3, EF 20%, PR 0%, Ki-67 30%, left breast UIQ: Fibroadenoma with extensive  calcifications Clinical stage in May 2017: T2 N0 stage II a Clinical stage 05/08/2016: Stage IV  Hospitalization 5/5-5/7: Breast abscess Patient was lost to follow up Refused treatment previously opting for homeopathic treatments. Goals of treatment: Palliation and prolongation of life.  Plan: 1. Palliative treatment with Taxol every 3 weeks I discussed risks and benefits of Taxol including the risk of allergy/anaphylaxis, neuropathy risk, decrease in blood counts and risk of infection, liver and kidney function changes, fatigue, loss of taste and hair loss.  Patient will need the following 1. PET/CT scan 2. MRI brain 3. Chemotherapy with Taxol to start in 2 weeks 4. IR for port placement 5. Chemotherapy class       I spent 25 minutes talking to the patient of which more than half was spent in counseling and coordination of care.  Orders Placed This Encounter  Procedures  . NM PET Image Initial (PI) Skull Base To Thigh    Standing Status:   Future    Standing Expiration Date:   10/01/2017    Order Specific Question:   Reason for Exam (SYMPTOM  OR DIAGNOSIS REQUIRED)    Answer:  Metastatic breast cancer    Order Specific Question:   If indicated for the ordered procedure, I authorize the administration of a radiopharmaceutical per Radiology protocol    Answer:   Yes    Order Specific Question:   Preferred imaging location?    Answer:   [facility]    Order Specific Question:   Radiology Contrast Protocol - do NOT remove file path    Answer:   [email or web address]  . MR BRAIN W WO CONTRAST    Standing Status:   Future    Standing Expiration Date:   11/05/2017    Order Specific Question:   Reason for exam:    Answer:   Metastatic breast cancer with facial twitching and visual changes    Order Specific Question:   Preferred imaging location?    Answer:   [facility] (table limit-350 lbs)    Order Specific Question:   Does the patient  have a pacemaker or implanted devices?    Answer:   No  . IR FLUORO GUIDE PORT INSERTION RIGHT    Standing Status:   Future    Standing Expiration Date:   12/01/2017    Order Specific Question:   Reason for Exam (SYMPTOM  OR DIAGNOSIS REQUIRED)    Answer:   Metastatic breast cancer needs port for IV chemotherapy    Order Specific Question:   Preferred Imaging Location?    Answer:   [facility]   The patient has a good understanding of the overall plan. she agrees with it. she will call with any problems that may develop before the next visit here.   Rulon Eisenmenger, MD 10/01/16

## 2016-10-02 ENCOUNTER — Encounter: Payer: Self-pay | Admitting: *Deleted

## 2016-10-02 ENCOUNTER — Telehealth: Payer: Self-pay

## 2016-10-02 NOTE — Telephone Encounter (Signed)
Called pt to let her know that she is scheduled for pet scan and mri on 5/16 at 7am and 1pm at [facility]. Gave pt instructions to be NPO 6hrs before PET scan. Pt verbalized instructions and times/dates for scans. Told pt check in at [facility] 1st floor at "admitting" and she will be directed accordingly. Reminded pt that she also has a port placement procedure on Friday 5/18 next week. Pt may eat after pet scan but not too close to MRI procedure. Pt verbalized understanding and said that she will be at [facility] on 5/16

## 2016-10-02 NOTE — Progress Notes (Signed)
[place] Work  Clinical Social Work was referred by [facility] SW, Meriam Sprague [phone]) for assessment of psychosocial needs due to pt requesting ALF placement.  Clinical Social Worker reviewed chart, spoke with [name] and contacted patient at home to offer support and assess for needs.  CSW introduced self, explained role of CSW/Pt and Family Support Team, support groups and other resources to assist. Pt reports to have been living off and on for the six months in extended stay motels. She had requested ALF, but did not want to spend her SSI check on this need. CSW reviewed levels of care and possible assistance through [facility] for CNA and pt denies needing help with ADLs at this point. Pt reports she would like to secure stable housing, but has not had the deposit money to date. She reports her son brings her to her appointments, but is unaware of her diagnosis. CSW explored this further and pt "talked about this with doctor, no one can know". CSW encouraged pt to consider bringing friend or family to chemo education in order to be aware of "what to do in emergency or if complications". She kept repeating "the doctor knows". Pt could benefit from further education.    CSW educated pt about grant funds and other resources. CSW encouraged pt to reach out to financial counselor and CSW team to follow up when pt starts treatment.    Clinical Social Work interventions:  Supportive listening Resource education  Loren Racer, [place], OSW-C Clinical Social Worker [place]  [place] Phone: [phone] Fax: [phone]

## 2016-10-07 ENCOUNTER — Other Ambulatory Visit: Payer: Self-pay | Admitting: Radiology

## 2016-10-07 ENCOUNTER — Ambulatory Visit (HOSPITAL_COMMUNITY): Admission: RE | Admit: 2016-10-07 | Payer: Medicaid Other | Source: Ambulatory Visit

## 2016-10-07 ENCOUNTER — Ambulatory Visit (HOSPITAL_COMMUNITY): Payer: Medicaid Other

## 2016-10-08 NOTE — Patient Instructions (Signed)
Arrive at 0930 in Radiology @ [facility], npo after mdnt, have driver

## 2016-10-09 ENCOUNTER — Emergency Department (HOSPITAL_BASED_OUTPATIENT_CLINIC_OR_DEPARTMENT_OTHER)
Admission: EM | Admit: 2016-10-09 | Discharge: 2016-10-09 | Disposition: A | Discharge status: Home / Self Care | Payer: Medicaid Other | Attending: Emergency Medicine | Admitting: Emergency Medicine

## 2016-10-09 ENCOUNTER — Encounter (HOSPITAL_BASED_OUTPATIENT_CLINIC_OR_DEPARTMENT_OTHER): Payer: Self-pay | Admitting: Emergency Medicine

## 2016-10-09 ENCOUNTER — Inpatient Hospital Stay (HOSPITAL_COMMUNITY)
Admission: RE | Admit: 2016-10-09 | Discharge: 2016-10-09 | Disposition: A | Discharge status: Home / Self Care | Payer: Medicaid Other | Source: Ambulatory Visit | Attending: Hematology and Oncology | Admitting: Hematology and Oncology

## 2016-10-09 ENCOUNTER — Ambulatory Visit (HOSPITAL_COMMUNITY): Admission: RE | Admit: 2016-10-09 | Payer: Medicaid Other | Source: Ambulatory Visit

## 2016-10-09 DIAGNOSIS — C7801 Secondary malignant neoplasm of right lung: Secondary | ICD-10-CM | POA: Insufficient documentation

## 2016-10-09 DIAGNOSIS — Z4800 Encounter for change or removal of nonsurgical wound dressing: Secondary | ICD-10-CM | POA: Diagnosis present

## 2016-10-09 DIAGNOSIS — C50812 Malignant neoplasm of overlapping sites of left female breast: Secondary | ICD-10-CM | POA: Insufficient documentation

## 2016-10-09 DIAGNOSIS — Z5189 Encounter for other specified aftercare: Secondary | ICD-10-CM

## 2016-10-09 DIAGNOSIS — Z79899 Other long term (current) drug therapy: Secondary | ICD-10-CM | POA: Insufficient documentation

## 2016-10-09 MED ORDER — ACETAMINOPHEN 500 MG PO TABS: 1000.0000 mg | ORAL_TABLET | Freq: Four times a day (QID) | ORAL | 0 refills | Status: DC | PRN

## 2016-10-09 MED ORDER — ACETAMINOPHEN 500 MG PO TABS
1000.0000 mg | ORAL_TABLET | Freq: Once | ORAL | Status: AC
  Administered 2016-10-09: 1000 mg via ORAL
  Filled 2016-10-09: qty 2

## 2016-10-09 MED ORDER — SULFAMETHOXAZOLE-TRIMETHOPRIM 800-160 MG PO TABS: 1.0000 | ORAL_TABLET | Freq: Two times a day (BID) | ORAL | 0 refills | Status: AC

## 2016-10-09 MED ORDER — SULFAMETHOXAZOLE-TRIMETHOPRIM 800-160 MG PO TABS
1.0000 | ORAL_TABLET | Freq: Once | ORAL | Status: AC
  Administered 2016-10-09: 1 via ORAL
  Filled 2016-10-09: qty 1

## 2016-10-09 NOTE — ED Provider Notes (Signed)
[facility] DEPT [facility] Provider Note   CSN: [id number] Arrival date & time: 10/09/16  1358     History   Chief Complaint Chief Complaint  Patient presents with  . Wound Check    HPI Ann Oliver is a 61 y.o. female.  HPI Patient states that she has a home health care provider coming on Monday to help with dressing changes but she has not been able to get a dressing done on her left breast wound since her discharge from [facility] long hospital. She reports that Tylenol is adequate for pain control for her. She is wondering if she needs another antibiotic. She reports that there is still drainage and redness. She denies she's having fever, chills or feels unwell. She denies lower extremity swelling or pain. Past Medical History:  Diagnosis Date  . Cancer ([place])   . Sinusitis   . Tumor cells     Patient Active Problem List   Diagnosis Date Noted  . Encounter for palliative care   . Goals of care, counseling/discussion   . Breast cancer metastasized to lung, right ([place]) 09/25/2016  . Malnutrition of moderate degree 09/25/2016  . Breast cancer of upper-outer quadrant of left female breast ([place]) 10/22/2015  . THYROID NODULE, RIGHT 05/31/2007  . DEPRESSION 05/31/2007  . GERD 05/31/2007  . HOT FLASHES 05/31/2007  . HEADACHE 05/31/2007    Past Surgical History:  Procedure Laterality Date  . BIOPSY BREAST      OB History    No data available       Home Medications    Prior to Admission medications   Medication Sig Start Date End Date Taking? Authorizing Provider  acetaminophen (TYLENOL) 500 MG tablet Take 2 tablets (1,000 mg total) by mouth every 6 (six) hours as needed. 10/09/16   Charlesetta Shanks, MD  feeding supplement, ENSURE ENLIVE, (ENSURE ENLIVE) LIQD Take 237 mLs by mouth 3 (three) times daily between meals. 09/26/16   [name], MD  lidocaine-prilocaine (EMLA) cream Apply to affected area once 10/01/16   Nicholas Lose, MD  ondansetron (ZOFRAN) 8 MG tablet  Take 1 tablet (8 mg total) by mouth 2 (two) times daily as needed (Nausea or vomiting). 10/01/16   Nicholas Lose, MD  prochlorperazine (COMPAZINE) 10 MG tablet Take 1 tablet (10 mg total) by mouth every 6 (six) hours as needed (Nausea or vomiting). 10/01/16   Nicholas Lose, MD  sulfamethoxazole-trimethoprim (BACTRIM DS,SEPTRA DS) 800-160 MG tablet Take 1 tablet by mouth 2 (two) times daily. 10/09/16 10/16/16  Charlesetta Shanks, MD  traMADol (ULTRAM) 50 MG tablet Take 1 tablet (50 mg total) by mouth every 6 (six) hours as needed. 10/01/16   Nicholas Lose, MD    Family History No family history on file.  Social History Social History  Substance Use Topics  . Smoking status: Never Smoker  . Smokeless tobacco: Never Used  . Alcohol use No     Allergies   Aspirin   Review of Systems Review of Systems 10 Systems reviewed and are negative for acute change except as noted in the HPI.   Physical Exam Updated Vital Signs BP (!) 151/89   Pulse 91   Temp 98.6 F (37 C) (Oral)   Resp 16   Ht 5\' 2"  (1.575 m)   Wt 120 lb (54.4 kg)   SpO2 100%   BMI 21.95 kg/m   Physical Exam  Constitutional: She is oriented to person, place, and time. She appears well-developed and well-nourished. No distress.  Patient is  very alert and in no distress. She is pleasant and interactive. No respiratory distress.  HENT:  Head: Normocephalic and atraumatic.  Eyes: Conjunctivae and EOM are normal.  Neck: Neck supple.  Cardiovascular: Normal rate, regular rhythm and normal heart sounds.   No murmur heard. Pulmonary/Chest: Effort normal and breath sounds normal. No respiratory distress.  Left breast has a very large fungating mass on the lateral aspect of the breast. This is the approximate size of a softball with irregular margins and a fairly large approximately 8 cm area of erosion. There is some serous sanguinous drainage.  Abdominal: Soft. There is no tenderness.  Musculoskeletal: Normal range of motion.  She exhibits no edema or tenderness.  Neurological: She is alert and oriented to person, place, and time. No cranial nerve deficit. She exhibits normal muscle tone. Coordination normal.  Skin: Skin is warm and dry.  Psychiatric: She has a normal mood and affect.  Nursing note and vitals reviewed.    ED Treatments / Results  Labs (all labs ordered are listed, but only abnormal results are displayed) Labs Reviewed - No data to display  EKG  EKG Interpretation None       Radiology No results found.  Procedures Procedures (including critical care time)  Medications Ordered in ED Medications  acetaminophen (TYLENOL) tablet 1,000 mg (1,000 mg Oral Given 10/09/16 1724)  sulfamethoxazole-trimethoprim (BACTRIM DS,SEPTRA DS) 800-160 MG per tablet 1 tablet (1 tablet Oral Given 10/09/16 1724)     Initial Impression / Assessment and Plan / ED Course  I have reviewed the triage vital signs and the nursing notes.  Pertinent labs & imaging results that were available during my care of the patient were reviewed by me and considered in my medical decision making (see chart for details).      Final Clinical Impressions(s) / ED Diagnoses   Final diagnoses:  Visit for wound check  Malignant neoplasm of overlapping sites of left female breast, unspecified estrogen receptor status ([place])   Patient is not having any pain complaints or acute changes in regards to extremely large fungating breast mass. She reports that she needs help with dressing change and she anticipates in-home help on Monday. She reports Tylenol has been sufficient for pain control. She notes her has been some drainage and wonders if she should take some antibiotics again. She finished antibiotics prescribed at her hospital discharge. At this time, the mass is extremely large and chronic. There is some erythema at the skin tissues of the base of the tumor and some serosanguineous discharge. Is difficult to tell if this is  inflammatory change associated with the chronicity of the wound versus any acute overlying infection. I somewhat more suspect chronic inflammatory changes however patient will appear to be placed on Bactrim and she was treated for recent associated infection and counseled on follow-up with her oncologist. Despite the severity of her condition, the patient appears to be in a good mood with no signs of pain distress or acute constitutional symptoms. New Prescriptions New Prescriptions   ACETAMINOPHEN (TYLENOL) 500 MG TABLET    Take 2 tablets (1,000 mg total) by mouth every 6 (six) hours as needed.   SULFAMETHOXAZOLE-TRIMETHOPRIM (BACTRIM DS,SEPTRA DS) 800-160 MG TABLET    Take 1 tablet by mouth 2 (two) times daily.     Charlesetta Shanks, MD 10/09/16 [phone]

## 2016-10-09 NOTE — ED Notes (Signed)
Pt has no complaint or pain at this time, pt reports she wants the site cleaned and redressed. Pt was unable to get into doctors office today.

## 2016-10-09 NOTE — Discharge Instructions (Signed)
Good dressing in place until it is changed by her home health care provider on Monday. You may change the outer layers of the dressing with the provided pads if it does drain or soil the dressings.  Return to the emergency department if he developed fever, chills or worsening pain.

## 2016-10-09 NOTE — ED Triage Notes (Signed)
Pt has severe metatastic breast CA with a large wound to L breast, just discharged for [facility] for same. Pt here for recheck of wound and dressing change. Pt states they are working to get a nurse to come to her house to be able to do this. Pt has finished the abx.

## 2016-10-11 ENCOUNTER — Telehealth (HOSPITAL_BASED_OUTPATIENT_CLINIC_OR_DEPARTMENT_OTHER): Payer: Self-pay | Admitting: *Deleted

## 2016-10-12 ENCOUNTER — Emergency Department (HOSPITAL_COMMUNITY)
Admission: EM | Admit: 2016-10-12 | Discharge: 2016-10-12 | Disposition: A | Discharge status: Home / Self Care | Payer: Medicaid Other | Attending: Emergency Medicine | Admitting: Emergency Medicine

## 2016-10-12 ENCOUNTER — Encounter (HOSPITAL_COMMUNITY): Payer: Self-pay | Admitting: Emergency Medicine

## 2016-10-12 DIAGNOSIS — Z79899 Other long term (current) drug therapy: Secondary | ICD-10-CM | POA: Diagnosis not present

## 2016-10-12 DIAGNOSIS — Z4800 Encounter for change or removal of nonsurgical wound dressing: Secondary | ICD-10-CM | POA: Insufficient documentation

## 2016-10-12 DIAGNOSIS — Z853 Personal history of malignant neoplasm of breast: Secondary | ICD-10-CM | POA: Diagnosis not present

## 2016-10-12 MED ORDER — ACETAMINOPHEN 325 MG PO TABS: 650.0000 mg | ORAL_TABLET | Freq: Once | ORAL | Status: DC

## 2016-10-12 MED ORDER — ACETAMINOPHEN 500 MG PO TABS
1000.0000 mg | ORAL_TABLET | Freq: Once | ORAL | Status: AC
  Administered 2016-10-12: 1000 mg via ORAL
  Filled 2016-10-12: qty 2

## 2016-10-12 NOTE — ED Notes (Signed)
Bed: [facility] Expected date:  Expected time:  Means of arrival:  Comments: 

## 2016-10-12 NOTE — ED Triage Notes (Signed)
Pt states she is here for her dressing to be changed on her L breast. Pt states she has home health health was supposed to come today, but the did not.. Pt states she is currently taking antibiotics for the wound. Pt states she would like a list of home health options from our social worker.

## 2016-10-12 NOTE — ED Provider Notes (Signed)
[facility] DEPT Provider Note   CSN: [id number] Arrival date & time: 10/12/16  1256  By signing my name below, I, Ann Oliver, attest that this documentation has been prepared under the direction and in the presence of Ann Sails, PA-C. Electronically Signed: Levester Oliver, Scribe. 10/12/2016. 4:11 PM.   History   Chief Complaint Chief Complaint  Patient presents with  . Wound Check   Ann Oliver is a 61 y.o. female with a PMHx of metastatic breast cancer, who presents to the Emergency Department for a dressing change of her left breast mass. Pt reports that she is supposed to have her dressing changed x1 per day, but has not had it changed for x3 days. It is unclear whether she is not arranging for home health, or if they are not giving her all of the supplies that she needs. She states that the wound is at its baseline, with no changes to quality or quantity of drainage or change in odor. Here, she denies experiencing any other acute sx, including pain, fever, chills, abdominal pain, nausea, vomiting, diarrhea, chest pain or dyspnea. She has been medication complaint in her Bactrim. Per triage, she would like a list of home health options from our social worker while she is here.   The history is provided by the patient and medical records. No language interpreter was used.    Past Medical History:  Diagnosis Date  . Cancer ([place])   . Sinusitis   . Tumor cells     Patient Active Problem List   Diagnosis Date Noted  . Encounter for palliative care   . Goals of care, counseling/discussion   . Breast cancer metastasized to lung, right ([place]) 09/25/2016  . Malnutrition of moderate degree 09/25/2016  . Breast cancer of upper-outer quadrant of left female breast ([place]) 10/22/2015  . THYROID NODULE, RIGHT 05/31/2007  . DEPRESSION 05/31/2007  . GERD 05/31/2007  . HOT FLASHES 05/31/2007  . HEADACHE 05/31/2007    Past Surgical History:  Procedure Laterality Date  .  BIOPSY BREAST      OB History    No data available       Home Medications    Prior to Admission medications   Medication Sig Start Date End Date Taking? Authorizing Provider  acetaminophen (TYLENOL) 500 MG tablet Take 2 tablets (1,000 mg total) by mouth every 6 (six) hours as needed. 10/09/16   Charlesetta Shanks, MD  feeding supplement, ENSURE ENLIVE, (ENSURE ENLIVE) LIQD Take 237 mLs by mouth 3 (three) times daily between meals. 09/26/16   [name], MD  lidocaine-prilocaine (EMLA) cream Apply to affected area once 10/01/16   Nicholas Lose, MD  ondansetron (ZOFRAN) 8 MG tablet Take 1 tablet (8 mg total) by mouth 2 (two) times daily as needed (Nausea or vomiting). 10/01/16   Nicholas Lose, MD  prochlorperazine (COMPAZINE) 10 MG tablet Take 1 tablet (10 mg total) by mouth every 6 (six) hours as needed (Nausea or vomiting). 10/01/16   Nicholas Lose, MD  sulfamethoxazole-trimethoprim (BACTRIM DS,SEPTRA DS) 800-160 MG tablet Take 1 tablet by mouth 2 (two) times daily. 10/09/16 10/16/16  Charlesetta Shanks, MD  traMADol (ULTRAM) 50 MG tablet Take 1 tablet (50 mg total) by mouth every 6 (six) hours as needed. 10/01/16   Nicholas Lose, MD    Family History No family history on file.  Social History Social History  Substance Use Topics  . Smoking status: Never Smoker  . Smokeless tobacco: Never Used  . Alcohol use  No     Allergies   Aspirin   Review of Systems Review of Systems  Constitutional: Negative for activity change, chills and fever.  Respiratory: Negative for chest tightness and shortness of breath.   Cardiovascular: Negative for chest pain.  Gastrointestinal: Negative for abdominal pain, diarrhea, nausea and vomiting.  Musculoskeletal: Negative for arthralgias and myalgias.  Skin: Positive for wound.  All other systems reviewed and are negative.    Physical Exam Updated Vital Signs BP 126/76 (BP Location: Left Arm)   Pulse 97   Temp 98.2 F (36.8 C) (Oral)   Resp 16    SpO2 100%   Physical Exam  Constitutional: She is oriented to person, place, and time. No distress.  HENT:  Head: Normocephalic and atraumatic.  Mouth/Throat: Oropharynx is clear and moist.  Eyes: Pupils are equal, round, and reactive to light.  Neck: Normal range of motion.  Cardiovascular: Normal rate and regular rhythm.   Pulmonary/Chest: Effort normal and breath sounds normal. She has no decreased breath sounds.  Lymphadenopathy:    She has no cervical adenopathy.  Neurological: She is alert and oriented to person, place, and time.  Skin: Skin is warm and dry. Lesion noted.  Left breast has a very large, malodorous, fungating mass on the lateral aspect of the breast. Larger than softball with irregular margins, approximately 12 x 12 cm. Thin yellow discharge oozing out. Mildly TTP.  Psychiatric: She has a normal mood and affect.  Nursing note and vitals reviewed.     ED Treatments / Results  DIAGNOSTIC STUDIES: Oxygen Saturation is 100% on RA, nl by my interpretation.    COORDINATION OF CARE: 1:45 PM Discussed treatment plan with pt at bedside and pt agreed to plan.  Labs (all labs ordered are listed, but only abnormal results are displayed) Labs Reviewed - No data to display  EKG  EKG Interpretation None       Radiology No results found.  Procedures Procedures (including critical care time)  Medications Ordered in ED Medications  acetaminophen (TYLENOL) tablet 650 mg (not administered)  acetaminophen (TYLENOL) tablet 1,000 mg (not administered)     Initial Impression / Assessment and Plan / ED Course  I have reviewed the triage vital signs and the nursing notes.  Pertinent labs & imaging results that were available during my care of the patient were reviewed by me and considered in my medical decision making (see chart for details).  Patient returns for check of dressing change of extremely large fungating breast mass   61 year old female with breast  cancer and very large fungating mass presents to the ED for dressing change. Patient reports home health aide did not come to her house this morning as scheduled. Denies any other systemic symptoms. Pain is well-controlled with Tylenol. Last dressing changed was 3 days ago. Per chart review patient was diagnosed with breast cancer in 2017, unfortunately she was lost to follow-up and returned for follow-up recently with oncology. Patient is planned to start chemotherapy in 3 days. She was seen in the ED for dressing change 3 days ago and was started on Bactrim for possible infection of left breast mass. Patient has been compliant with Bactrim. When compared to picture of mass from February 2018, it appears that mass has enlarged. Please see image of breast mass. Dressing was changed in the ED. Patient discussed with supervising physician who reviewed patient's chart and is agreeable with ED treatment and discharge plan. Advised patient to continue Bactrim, continue Tylenol and  contact home health aide to establish consistent dressing changes. Patient plans on following up with cancer treatment in 3 days.    Final Clinical Impressions(s) / ED Diagnoses   Final diagnoses:  Encounter for dressing of wound    New Prescriptions New Prescriptions   No medications on file    I personally performed the services described in this documentation, which was scribed in my presence. The recorded information has been reviewed and is accurate.    Kinnie Feil, PA-C 10/12/16 1611    Carmin Muskrat, MD 10/16/16 Berniece Salines

## 2016-10-12 NOTE — ED Notes (Signed)
New dressing applied to Pts very large tumor under her left axilla.

## 2016-10-12 NOTE — Discharge Instructions (Signed)
We changed your dressing today.  Please contact home health aide for continued dressing changes.  Continue taking Tylenol for pain as needed. Continue taking antibiotics as prescribed.  Please follow-up with your scheduled upcoming appointment for follow-up cancer treatment.

## 2016-10-13 ENCOUNTER — Telehealth: Payer: Self-pay | Admitting: *Deleted

## 2016-10-13 ENCOUNTER — Other Ambulatory Visit: Payer: Self-pay

## 2016-10-13 ENCOUNTER — Other Ambulatory Visit: Payer: Medicaid Other

## 2016-10-13 ENCOUNTER — Encounter: Payer: Self-pay | Admitting: Pharmacist

## 2016-10-13 NOTE — Telephone Encounter (Signed)
No additional note

## 2016-10-14 ENCOUNTER — Encounter (HOSPITAL_COMMUNITY): Payer: Self-pay

## 2016-10-14 ENCOUNTER — Other Ambulatory Visit: Payer: Self-pay | Admitting: Radiology

## 2016-10-14 ENCOUNTER — Emergency Department (HOSPITAL_COMMUNITY)
Admission: EM | Admit: 2016-10-14 | Discharge: 2016-10-14 | Disposition: A | Discharge status: Home / Self Care | Payer: Medicaid Other | Attending: Emergency Medicine | Admitting: Emergency Medicine

## 2016-10-14 DIAGNOSIS — Z79899 Other long term (current) drug therapy: Secondary | ICD-10-CM | POA: Diagnosis not present

## 2016-10-14 DIAGNOSIS — Z853 Personal history of malignant neoplasm of breast: Secondary | ICD-10-CM | POA: Diagnosis not present

## 2016-10-14 DIAGNOSIS — Z859 Personal history of malignant neoplasm, unspecified: Secondary | ICD-10-CM | POA: Insufficient documentation

## 2016-10-14 DIAGNOSIS — Z5189 Encounter for other specified aftercare: Secondary | ICD-10-CM

## 2016-10-14 DIAGNOSIS — Z4801 Encounter for change or removal of surgical wound dressing: Secondary | ICD-10-CM | POA: Diagnosis not present

## 2016-10-14 NOTE — ED Provider Notes (Signed)
[facility] DEPT Provider Note   CSN: [id number] Arrival date & time: 10/14/16  4403     History   Chief Complaint Chief Complaint  Patient presents with  . Dressing Change    HPI Jenisa Monty is a 61 y.o. female.  HPI   61 yo F with h/o metastatic breast CA with known large, fungating tumor of left breast here for dressing change. Pt has been seen twice in last week for this in ED, and reportedly has home health arranged but is unable to see them as she does not have stable housing. Pt currently states that the wound seems to be "getting better.". She is on Bactrim for possible superinfection of area and states that she has not had any fever, chills, nausea, or vomiting. The pain is well controlled with tylenol. She is scheduled for follow-up with Oncology tomorrow. She has home health but states that she "missed her" yesterday. She believes the drainage/smell is improving. Denies any spreading pain.  Past Medical History:  Diagnosis Date  . Cancer ([place])   . Sinusitis   . Tumor cells     Patient Active Problem List   Diagnosis Date Noted  . Encounter for palliative care   . Goals of care, counseling/discussion   . Breast cancer metastasized to lung, right ([place]) 09/25/2016  . Malnutrition of moderate degree 09/25/2016  . Breast cancer of upper-outer quadrant of left female breast ([place]) 10/22/2015  . THYROID NODULE, RIGHT 05/31/2007  . DEPRESSION 05/31/2007  . GERD 05/31/2007  . HOT FLASHES 05/31/2007  . HEADACHE 05/31/2007    Past Surgical History:  Procedure Laterality Date  . BIOPSY BREAST      OB History    No data available       Home Medications    Prior to Admission medications   Medication Sig Start Date End Date Taking? Authorizing Provider  feeding supplement, ENSURE ENLIVE, (ENSURE ENLIVE) LIQD Take 237 mLs by mouth 3 (three) times daily between meals. 09/26/16  Yes [name], MD  ibuprofen (ADVIL,MOTRIN) 200 MG tablet Take 400 mg by  mouth every 4 (four) hours as needed for mild pain or moderate pain.   Yes [provider]  lidocaine-prilocaine (EMLA) cream Apply to affected area once 10/01/16  Yes Nicholas Lose, MD  sulfamethoxazole-trimethoprim (BACTRIM DS,SEPTRA DS) 800-160 MG tablet Take 1 tablet by mouth 2 (two) times daily. 10/09/16 10/16/16 Yes Charlesetta Shanks, MD  traMADol (ULTRAM) 50 MG tablet Take 1 tablet (50 mg total) by mouth every 6 (six) hours as needed. 10/01/16  Yes Nicholas Lose, MD  acetaminophen (TYLENOL) 500 MG tablet Take 2 tablets (1,000 mg total) by mouth every 6 (six) hours as needed. Patient not taking: Reported on 10/14/2016 10/09/16   Charlesetta Shanks, MD  ondansetron (ZOFRAN) 8 MG tablet Take 1 tablet (8 mg total) by mouth 2 (two) times daily as needed (Nausea or vomiting). Patient not taking: Reported on 10/14/2016 10/01/16   Nicholas Lose, MD  prochlorperazine (COMPAZINE) 10 MG tablet Take 1 tablet (10 mg total) by mouth every 6 (six) hours as needed (Nausea or vomiting). Patient not taking: Reported on 10/14/2016 10/01/16   Nicholas Lose, MD    Family History History reviewed. No pertinent family history.  Social History Social History  Substance Use Topics  . Smoking status: Never Smoker  . Smokeless tobacco: Never Used  . Alcohol use No     Allergies   Aspirin   Review of Systems Review of Systems  Constitutional: Positive  for fatigue. Negative for chills and fever.  HENT: Negative for congestion and rhinorrhea.   Eyes: Negative for visual disturbance.  Respiratory: Negative for cough, shortness of breath and wheezing.   Cardiovascular: Negative for chest pain and leg swelling.  Gastrointestinal: Negative for abdominal pain, diarrhea, nausea and vomiting.  Genitourinary: Negative for dysuria and flank pain.  Musculoskeletal: Negative for neck pain and neck stiffness.  Skin: Positive for rash and wound.  Allergic/Immunologic: Negative for immunocompromised state.    Neurological: Negative for syncope, weakness and headaches.  All other systems reviewed and are negative.    Physical Exam Updated Vital Signs BP 128/83 (BP Location: Right Arm)   Pulse 90   Temp 97.7 F (36.5 C) (Oral)   Resp 16   SpO2 100%   Physical Exam  Constitutional: She is oriented to person, place, and time. She appears well-developed and well-nourished. No distress.  HENT:  Head: Normocephalic and atraumatic.  Eyes: Conjunctivae are normal.  Neck: Neck supple.  Cardiovascular: Normal rate, regular rhythm and normal heart sounds.  Exam reveals no friction rub.   No murmur heard. Pulmonary/Chest: Effort normal and breath sounds normal. No respiratory distress. She has no wheezes. She has no rales.  Large, fungating mass to left latera chest wall, with irregular borders. Multiple diffuse areas of excoriation throughout mass with intact granulation tissue and beds of fibrinous tissue. No expressible purulence. No apparent TTP. There is mild skin erythema surrounding wound along distribution of tape. No open wounds.  Abdominal: She exhibits no distension.  Musculoskeletal: She exhibits no edema.  Neurological: She is alert and oriented to person, place, and time. She exhibits normal muscle tone.  Skin: Skin is warm. Capillary refill takes less than 2 seconds.  Psychiatric: She has a normal mood and affect.  Nursing note and vitals reviewed.        ED Treatments / Results  Labs (all labs ordered are listed, but only abnormal results are displayed) Labs Reviewed - No data to display  EKG  EKG Interpretation None       Radiology No results found.  Procedures Procedures (including critical care time)  Medications Ordered in ED Medications - No data to display   Initial Impression / Assessment and Plan / ED Course  I have reviewed the triage vital signs and the nursing notes.  Pertinent labs & imaging results that were available during my care of the  patient were reviewed by me and considered in my medical decision making (see chart for details).    61 year old female with metastatic breast cancer and known large, fungating tumor of left chest wall here with desire for dressing change. Patient has been seen multiple times for this. On exam, patient has large, fungating mass with fibrinous exudates but no apparent evidence of superinfection. She has no fevers, chills, or constitutional signs to suggest superinfection. She is on antibiotics and feels that the drainage and smell has improved. She does have some mild skin breakdown around the mass likely due to tape. Will consult wound ostomy. She declined social work consult for assistance with wound care and has oncology follow-up tomorrow. Denies any other changes in her baseline set of health.   Given local skin breakdown, I consulted Wound Care for evaluation - appreciate recs. They stated that topical flagyl may help with odor but she declines as she is already on ABX. Given o/w unchanged to improving appearance, will discharge with outpatient follow-up.   Final Clinical Impressions(s) / ED Diagnoses  Final diagnoses:  Dressing change or removal, surgical wound  Visit for wound check    New Prescriptions New Prescriptions   No medications on file     Duffy Bruce, MD 10/14/16 1104

## 2016-10-14 NOTE — Consult Note (Addendum)
WOC consulted for wound care, orders written.  Bedside nurses to apply wound dressings.   CM or ED staff to arrange [facility] for wound care or establish care with community clinic.  Crushed or topical Flagyl can be helpful with fungating tumors such as this.  ED MD to provide script if agrees, however patient or HHRN would need to crush multiple tablets for coverage of this size tumor.    Contacted bedside nurse to notify her of orders for wound care.   Re consult if needed, will not follow at this time. Thanks  Lahela Woodin [employer], RN,CWOCN, CNS [phone])

## 2016-10-14 NOTE — ED Triage Notes (Signed)
Pt has metastatic breast cancer.  Pt has wound to upper left breast.  Pt has been coming here for wound checks and dressing changes.  Is supposed to have home health RN checks but pt is having changes in housing which is making visits impossible.  No pain.

## 2016-10-15 ENCOUNTER — Ambulatory Visit (HOSPITAL_COMMUNITY): Admission: RE | Admit: 2016-10-15 | Payer: Medicaid Other | Source: Ambulatory Visit

## 2016-10-15 ENCOUNTER — Inpatient Hospital Stay (HOSPITAL_COMMUNITY): Admission: RE | Admit: 2016-10-15 | Payer: Medicaid Other | Source: Ambulatory Visit

## 2016-10-16 ENCOUNTER — Ambulatory Visit: Payer: Medicaid Other | Admitting: Hematology and Oncology

## 2016-10-16 ENCOUNTER — Ambulatory Visit: Payer: Medicaid Other

## 2016-10-16 ENCOUNTER — Telehealth: Payer: Self-pay | Admitting: Hematology and Oncology

## 2016-10-16 ENCOUNTER — Other Ambulatory Visit: Payer: Medicaid Other

## 2016-10-16 NOTE — Telephone Encounter (Signed)
[facility] - moved 5/31 f/u from [facility] to [facility] per [facility] desk nurse. Left message for patient.

## 2016-10-22 ENCOUNTER — Ambulatory Visit: Payer: Medicaid Other | Admitting: Adult Health

## 2016-10-22 ENCOUNTER — Other Ambulatory Visit: Payer: Medicaid Other

## 2016-10-23 ENCOUNTER — Emergency Department (HOSPITAL_COMMUNITY)
Admission: EM | Admit: 2016-10-23 | Discharge: 2016-10-23 | Disposition: A | Discharge status: Home / Self Care | Payer: Medicaid Other | Attending: Emergency Medicine | Admitting: Emergency Medicine

## 2016-10-23 ENCOUNTER — Encounter (HOSPITAL_COMMUNITY): Payer: Self-pay | Admitting: Emergency Medicine

## 2016-10-23 DIAGNOSIS — C7981 Secondary malignant neoplasm of breast: Secondary | ICD-10-CM | POA: Diagnosis not present

## 2016-10-23 DIAGNOSIS — Z4800 Encounter for change or removal of nonsurgical wound dressing: Secondary | ICD-10-CM | POA: Diagnosis present

## 2016-10-23 DIAGNOSIS — C50919 Malignant neoplasm of unspecified site of unspecified female breast: Secondary | ICD-10-CM

## 2016-10-23 DIAGNOSIS — Z5189 Encounter for other specified aftercare: Secondary | ICD-10-CM

## 2016-10-23 MED ORDER — ACETAMINOPHEN 325 MG PO TABS
650.0000 mg | ORAL_TABLET | Freq: Once | ORAL | Status: AC
  Administered 2016-10-23: 650 mg via ORAL
  Filled 2016-10-23: qty 2

## 2016-10-23 NOTE — ED Triage Notes (Signed)
Patient brought in by [facility] from hotel for wound check on left breast area. Patient was seen here on 5/23 for same.  Patient requesting tylenol form EMS.  Patient report to EMS, "It doesn't smell anymore".

## 2016-10-23 NOTE — Discharge Instructions (Signed)
Please read instructions below. Follow-up with your oncologist. Return to the ER for fever, nausea, vomiting, new or concerning symptoms.

## 2016-10-23 NOTE — ED Provider Notes (Signed)
[facility] DEPT Provider Note   CSN: [id number] Arrival date & time: 10/23/16  1352     History   Chief Complaint Chief Complaint  Patient presents with  . Wound Check    HPI Ann Oliver is a 61 y.o. female.  Patient with metastatic breast cancer, and fungating left breast tumor presents to ED for a daily dressing change. She has been seen every day/every other day at either [facility] ER or [place] [facility] ER for dressing change; last visit was yesterday at [facility]. Patient lives in homeless shelter, and therefore is unable to receive care from home health for daily wound care. Patient states she has an appointment with Wound Care specialists on 10/26/2016. States she was in [place] today for an oncology appointment which she missed, and is rescheduling. Patient denies fever, chills, nausea, vomiting, increased drainage or pain. Patient states she believes the odor and wound is overall improving. Reports drainage is more clear than before. No other complaints at this time.      Past Medical History:  Diagnosis Date  . Cancer ([place])   . Sinusitis   . Tumor cells     Patient Active Problem List   Diagnosis Date Noted  . Encounter for palliative care   . Goals of care, counseling/discussion   . Breast cancer metastasized to lung, right ([place]) 09/25/2016  . Malnutrition of moderate degree 09/25/2016  . Breast cancer of upper-outer quadrant of left female breast ([place]) 10/22/2015  . THYROID NODULE, RIGHT 05/31/2007  . DEPRESSION 05/31/2007  . GERD 05/31/2007  . HOT FLASHES 05/31/2007  . HEADACHE 05/31/2007    Past Surgical History:  Procedure Laterality Date  . BIOPSY BREAST      OB History    No data available       Home Medications    Prior to Admission medications   Medication Sig Start Date End Date Taking? Authorizing Provider  acetaminophen (TYLENOL) 500 MG tablet Take 2 tablets (1,000 mg total) by mouth every 6 (six) hours as needed. Patient taking  differently: Take 1,000 mg by mouth every 6 (six) hours as needed for moderate pain.  10/09/16  Yes Charlesetta Shanks, MD  ibuprofen (ADVIL,MOTRIN) 200 MG tablet Take 400 mg by mouth every 4 (four) hours as needed for mild pain or moderate pain.   Yes [provider]  feeding supplement, ENSURE ENLIVE, (ENSURE ENLIVE) LIQD Take 237 mLs by mouth 3 (three) times daily between meals. Patient not taking: Reported on 10/23/2016 09/26/16   [name], MD  lidocaine-prilocaine (EMLA) cream Apply to affected area once Patient not taking: Reported on 10/23/2016 10/01/16   Nicholas Lose, MD  ondansetron (ZOFRAN) 8 MG tablet Take 1 tablet (8 mg total) by mouth 2 (two) times daily as needed (Nausea or vomiting). Patient not taking: Reported on 10/14/2016 10/01/16   Nicholas Lose, MD  prochlorperazine (COMPAZINE) 10 MG tablet Take 1 tablet (10 mg total) by mouth every 6 (six) hours as needed (Nausea or vomiting). Patient not taking: Reported on 10/14/2016 10/01/16   Nicholas Lose, MD  traMADol (ULTRAM) 50 MG tablet Take 1 tablet (50 mg total) by mouth every 6 (six) hours as needed. Patient not taking: Reported on 10/23/2016 10/01/16   Nicholas Lose, MD    Family History No family history on file.  Social History Social History  Substance Use Topics  . Smoking status: Never Smoker  . Smokeless tobacco: Never Used  . Alcohol use No     Allergies  Aspirin   Review of Systems Review of Systems  Constitutional: Negative for chills and fever.  Gastrointestinal: Negative for nausea and vomiting.  Musculoskeletal: Negative for arthralgias.  Skin: Positive for wound.     Physical Exam Updated Vital Signs BP 106/72   Pulse 94   Temp 97.8 F (36.6 C) (Oral)   Resp 15   Ht 5\' 1"  (1.549 m)   Wt 54.4 kg (120 lb)   SpO2 100%   BMI 22.67 kg/m   Physical Exam  Constitutional: She appears well-developed and well-nourished.  Pt is not in distress  HENT:  Head: Normocephalic and atraumatic.    Eyes: Conjunctivae are normal.  Cardiovascular: Normal rate.   Pulmonary/Chest: Effort normal.  Exam performed with chaperone present. There is a large malodorous fungating mass on the left lateral breast/chest wall, mass is irregularly shaped. There is yellow, hyperpigmented, and necrotic tissue on surface, with yellow drainage. Mass is not TTP.   Psychiatric: She has a normal mood and affect. Her behavior is normal.  Nursing note and vitals reviewed.    ED Treatments / Results  Labs (all labs ordered are listed, but only abnormal results are displayed) Labs Reviewed - No data to display  EKG  EKG Interpretation None       Radiology No results found.  Procedures Procedures (including critical care time)  Medications Ordered in ED Medications - No data to display   Initial Impression / Assessment and Plan / ED Course  I have reviewed the triage vital signs and the nursing notes.  Pertinent labs & imaging results that were available during my care of the patient were reviewed by me and considered in my medical decision making (see chart for details).     Pt w metastatic breast cancer and fungating mass left lateral breast/chest wall. Pt here for daily dressing change. Reports no symptoms indicating systemic infection. Patient is afebrile, vital signs stable. There appears to be no significant change today compared to most recent exams per chart review. Dressing removed, wound cleaning and re-dressed. Stressed the importance of close follow up with the patient. Encouraged she reschedule her oncology appointment and attend her Wound care appointment of 10/26/16. Pt safe for discharge.  Patient discussed with  Dr. Tamera Punt. Discussed results, findings, treatment and follow up. Patient advised of strict return precautions. Patient verbalized understanding and agreed with plan.  Final Clinical Impressions(s) / ED Diagnoses   Final diagnoses:  Visit for wound check  Metastatic  breast cancer [facility])    New Prescriptions New Prescriptions   No medications on file     Russo, [place] N, PA-C 10/23/16 1601    Malvin Johns, MD 10/24/16 [phone]

## 2016-10-28 ENCOUNTER — Emergency Department (HOSPITAL_COMMUNITY): Payer: Medicaid Other

## 2016-10-28 ENCOUNTER — Inpatient Hospital Stay (HOSPITAL_COMMUNITY)
Admission: EM | Admit: 2016-10-28 | Discharge: 2016-11-02 | DRG: 600 | Disposition: A | Discharge status: Home / Self Care | Payer: Medicaid Other | Attending: Internal Medicine | Admitting: Internal Medicine

## 2016-10-28 ENCOUNTER — Telehealth: Payer: Self-pay | Admitting: Hematology and Oncology

## 2016-10-28 ENCOUNTER — Encounter (HOSPITAL_COMMUNITY): Payer: Self-pay | Admitting: Emergency Medicine

## 2016-10-28 DIAGNOSIS — C50911 Malignant neoplasm of unspecified site of right female breast: Secondary | ICD-10-CM | POA: Diagnosis present

## 2016-10-28 DIAGNOSIS — T148XXA Other injury of unspecified body region, initial encounter: Secondary | ICD-10-CM

## 2016-10-28 DIAGNOSIS — C7800 Secondary malignant neoplasm of unspecified lung: Secondary | ICD-10-CM

## 2016-10-28 DIAGNOSIS — C787 Secondary malignant neoplasm of liver and intrahepatic bile duct: Secondary | ICD-10-CM

## 2016-10-28 DIAGNOSIS — L0390 Cellulitis, unspecified: Secondary | ICD-10-CM | POA: Diagnosis present

## 2016-10-28 DIAGNOSIS — Z79899 Other long term (current) drug therapy: Secondary | ICD-10-CM

## 2016-10-28 DIAGNOSIS — N61 Mastitis without abscess: Principal | ICD-10-CM | POA: Diagnosis present

## 2016-10-28 DIAGNOSIS — E440 Moderate protein-calorie malnutrition: Secondary | ICD-10-CM | POA: Diagnosis present

## 2016-10-28 DIAGNOSIS — Z590 Homelessness: Secondary | ICD-10-CM

## 2016-10-28 DIAGNOSIS — L089 Local infection of the skin and subcutaneous tissue, unspecified: Secondary | ICD-10-CM

## 2016-10-28 DIAGNOSIS — C50919 Malignant neoplasm of unspecified site of unspecified female breast: Secondary | ICD-10-CM | POA: Diagnosis present

## 2016-10-28 DIAGNOSIS — D638 Anemia in other chronic diseases classified elsewhere: Secondary | ICD-10-CM | POA: Diagnosis present

## 2016-10-28 DIAGNOSIS — Z886 Allergy status to analgesic agent status: Secondary | ICD-10-CM

## 2016-10-28 DIAGNOSIS — R7989 Other specified abnormal findings of blood chemistry: Secondary | ICD-10-CM | POA: Diagnosis present

## 2016-10-28 DIAGNOSIS — E43 Unspecified severe protein-calorie malnutrition: Secondary | ICD-10-CM | POA: Diagnosis present

## 2016-10-28 LAB — PROTIME-INR
INR: 0.94
Prothrombin Time: 12.5 seconds (ref 11.4–15.2)

## 2016-10-28 LAB — COMPREHENSIVE METABOLIC PANEL
ALBUMIN: 4.3 g/dL (ref 3.5–5.0)
ALT: 18 U/L (ref 14–54)
AST: 30 U/L (ref 15–41)
Alkaline Phosphatase: 90 U/L (ref 38–126)
Anion gap: 9 (ref 5–15)
BILIRUBIN TOTAL: 0.3 mg/dL (ref 0.3–1.2)
BUN: 15 mg/dL (ref 6–20)
CHLORIDE: 103 mmol/L (ref 101–111)
CO2: 28 mmol/L (ref 22–32)
CREATININE: 0.65 mg/dL (ref 0.44–1.00)
Calcium: 9.7 mg/dL (ref 8.9–10.3)
GFR calc Af Amer: >60 mL/min (ref >60)
GFR calc non Af Amer: >60 mL/min (ref >60)
GLUCOSE: 122 mg/dL — AB (ref 65–99)
POTASSIUM: 3.8 mmol/L (ref 3.5–5.1)
Sodium: 140 mmol/L (ref 135–145)
Total Protein: 8.2 g/dL — ABNORMAL HIGH (ref 6.5–8.1)

## 2016-10-28 LAB — CBC WITH DIFFERENTIAL/PLATELET
Basophils Absolute: 0 10*3/uL (ref 0.0–0.1)
Basophils Relative: 0 %
EOS PCT: 1 %
Eosinophils Absolute: 0.1 10*3/uL (ref 0.0–0.7)
HCT: 35.9 % — ABNORMAL LOW (ref 36.0–46.0)
Hemoglobin: 11.7 g/dL — ABNORMAL LOW (ref 12.0–15.0)
LYMPHS ABS: 1.1 10*3/uL (ref 0.7–4.0)
Lymphocytes Relative: 10 %
MCH: 26.8 pg (ref 26.0–34.0)
MCHC: 32.6 g/dL (ref 30.0–36.0)
MCV: 82.3 fL (ref 78.0–100.0)
MONO ABS: 1 10*3/uL (ref 0.1–1.0)
Monocytes Relative: 9 %
Neutro Abs: 8.6 10*3/uL — ABNORMAL HIGH (ref 1.7–7.7)
Neutrophils Relative %: 80 %
PLATELETS: 495 10*3/uL — AB (ref 150–400)
RBC: 4.36 MIL/uL (ref 3.87–5.11)
RDW: 13.3 % (ref 11.5–15.5)
WBC: 10.8 10*3/uL — ABNORMAL HIGH (ref 4.0–10.5)

## 2016-10-28 LAB — APTT: APTT: 26 s (ref 24–36)

## 2016-10-28 LAB — LIPASE, BLOOD: Lipase: 29 U/L (ref 11–51)

## 2016-10-28 MED ORDER — VANCOMYCIN HCL IN DEXTROSE 1-5 GM/200ML-% IV SOLN
1000.0000 mg | Freq: Once | INTRAVENOUS | Status: AC
  Administered 2016-10-29: 1000 mg via INTRAVENOUS
  Filled 2016-10-28: qty 200

## 2016-10-28 MED ORDER — ONDANSETRON HCL 4 MG PO TABS: 4.0000 mg | ORAL_TABLET | Freq: Four times a day (QID) | ORAL | Status: DC | PRN

## 2016-10-28 MED ORDER — ENSURE ENLIVE PO LIQD
237.0000 mL | Freq: Three times a day (TID) | ORAL | Status: DC
  Administered 2016-10-29 – 2016-11-02 (×13): 237 mL via ORAL

## 2016-10-28 MED ORDER — SODIUM CHLORIDE 0.9 % IV BOLUS (SEPSIS)
1000.0000 mL | Freq: Once | INTRAVENOUS | Status: AC
  Administered 2016-10-28: 1000 mL via INTRAVENOUS

## 2016-10-28 MED ORDER — PIPERACILLIN-TAZOBACTAM 3.375 G IVPB 30 MIN
3.3750 g | Freq: Once | INTRAVENOUS | Status: AC
  Administered 2016-10-29: 3.375 g via INTRAVENOUS
  Filled 2016-10-28: qty 50

## 2016-10-28 MED ORDER — TRAMADOL HCL 50 MG PO TABS
50.0000 mg | ORAL_TABLET | Freq: Four times a day (QID) | ORAL | Status: DC | PRN
  Administered 2016-10-29 – 2016-11-01 (×6): 50 mg via ORAL
  Filled 2016-10-28 (×8): qty 1

## 2016-10-28 MED ORDER — IOPAMIDOL (ISOVUE-300) INJECTION 61%
100.0000 mL | Freq: Once | INTRAVENOUS | Status: AC | PRN
  Administered 2016-10-28: 100 mL via INTRAVENOUS

## 2016-10-28 MED ORDER — ACETAMINOPHEN 500 MG PO TABS
1000.0000 mg | ORAL_TABLET | Freq: Once | ORAL | Status: AC
  Administered 2016-10-28: 1000 mg via ORAL
  Filled 2016-10-28: qty 2

## 2016-10-28 MED ORDER — ACETAMINOPHEN 650 MG RE SUPP: 650.0000 mg | Freq: Four times a day (QID) | RECTAL | Status: DC | PRN

## 2016-10-28 MED ORDER — IOPAMIDOL (ISOVUE-300) INJECTION 61%
INTRAVENOUS | Status: AC
  Filled 2016-10-28: qty 100

## 2016-10-28 MED ORDER — ACETAMINOPHEN 325 MG PO TABS
650.0000 mg | ORAL_TABLET | Freq: Four times a day (QID) | ORAL | Status: DC | PRN
  Administered 2016-10-29 (×2): 650 mg via ORAL
  Filled 2016-10-28 (×2): qty 2

## 2016-10-28 MED ORDER — ONDANSETRON HCL 4 MG/2ML IJ SOLN
4.0000 mg | Freq: Four times a day (QID) | INTRAMUSCULAR | Status: DC | PRN
  Administered 2016-10-30: 4 mg via INTRAVENOUS
  Filled 2016-10-28: qty 2

## 2016-10-28 MED ORDER — CEFAZOLIN SODIUM-DEXTROSE 1-4 GM/50ML-% IV SOLN
1.0000 g | Freq: Once | INTRAVENOUS | Status: AC
  Administered 2016-10-28: 1 g via INTRAVENOUS
  Filled 2016-10-28: qty 50

## 2016-10-28 NOTE — Telephone Encounter (Signed)
Scheduled appt per Rn May - Patient aware of appt dates and times. Gave patient calender.

## 2016-10-28 NOTE — ED Notes (Signed)
Patient transported to CT 

## 2016-10-28 NOTE — ED Provider Notes (Signed)
[facility] DEPT Provider Note   CSN: [id number] Arrival date & time: 10/28/16  1119     History   Chief Complaint Chief Complaint  Patient presents with  . Wound Infection  . Breast Mass    HPI Ann Oliver is a 61 y.o. female.  HPI   Ann Oliver is a 61 y.o. female, with a history of Metastatic breast cancer, homelessness, presenting to the ED for wound check of a fungating breast tumor. She reports a possible increase in discharge for the last two days. Drainage is yellow/brown. No increase in pain; controlled with intermittent tylenol.    She was taking Bactrim, but states she finished her course at the end of May. She was scheduled for an appointment with the [facility] on 6/4, but missed this appointment. States she is supposed to start treatments for her cancer next week, but does not known what they are going to do. States she is hoping to get an apartment and stable housing in two days.  Denies fever/chills, N/V, increase in pain, SOB, cough, abdominal pain, or any other complaints.    Oncologist: Nicholas Lose   Past Medical History:  Diagnosis Date  . Cancer ([place])   . Sinusitis   . Tumor cells     Patient Active Problem List   Diagnosis Date Noted  . Encounter for palliative care   . Goals of care, counseling/discussion   . Breast cancer metastasized to lung, right ([place]) 09/25/2016  . Malnutrition of moderate degree 09/25/2016  . Breast cancer of upper-outer quadrant of left female breast ([place]) 10/22/2015  . THYROID NODULE, RIGHT 05/31/2007  . DEPRESSION 05/31/2007  . GERD 05/31/2007  . HOT FLASHES 05/31/2007  . HEADACHE 05/31/2007    Past Surgical History:  Procedure Laterality Date  . BIOPSY BREAST      OB History    No data available       Home Medications    Prior to Admission medications   Medication Sig Start Date End Date Taking? Authorizing Provider  acetaminophen (TYLENOL) 500 MG tablet Take 2 tablets (1,000 mg  total) by mouth every 6 (six) hours as needed. Patient taking differently: Take 1,000 mg by mouth every 6 (six) hours as needed for moderate pain.  10/09/16  Yes Charlesetta Shanks, MD  feeding supplement, ENSURE ENLIVE, (ENSURE ENLIVE) LIQD Take 237 mLs by mouth 3 (three) times daily between meals. 09/26/16  Yes [name], MD  ibuprofen (ADVIL,MOTRIN) 200 MG tablet Take 400 mg by mouth every 4 (four) hours as needed for mild pain or moderate pain.   Yes [provider]  traMADol (ULTRAM) 50 MG tablet Take 1 tablet (50 mg total) by mouth every 6 (six) hours as needed. 10/01/16  Yes Nicholas Lose, MD  lidocaine-prilocaine (EMLA) cream Apply to affected area once Patient not taking: Reported on 10/23/2016 10/01/16   Nicholas Lose, MD  ondansetron (ZOFRAN) 8 MG tablet Take 1 tablet (8 mg total) by mouth 2 (two) times daily as needed (Nausea or vomiting). Patient not taking: Reported on 10/14/2016 10/01/16   Nicholas Lose, MD  prochlorperazine (COMPAZINE) 10 MG tablet Take 1 tablet (10 mg total) by mouth every 6 (six) hours as needed (Nausea or vomiting). Patient not taking: Reported on 10/14/2016 10/01/16   Nicholas Lose, MD    Family History History reviewed. No pertinent family history.  Social History Social History  Substance Use Topics  . Smoking status: Never Smoker  . Smokeless tobacco: Never Used  .  Alcohol use No     Allergies   Aspirin   Review of Systems Review of Systems  Constitutional: Negative for chills, diaphoresis and fever.  Respiratory: Negative for shortness of breath.   Cardiovascular: Negative for chest pain.  Gastrointestinal: Negative for abdominal pain, diarrhea, nausea and vomiting.  Genitourinary: Negative for dysuria and hematuria.  Skin: Positive for wound.  Neurological: Negative for dizziness, weakness, light-headedness, numbness and headaches.  All other systems reviewed and are negative.    Physical Exam Updated Vital Signs BP (!) 139/96 (BP  Location: Right Arm)   Pulse 96   Temp 98.2 F (36.8 C) (Oral)   Resp 18   SpO2 99%   Physical Exam  Constitutional: She appears cachectic. She appears ill (chronically). No distress.  HENT:  Head: Normocephalic and atraumatic.  Eyes: Conjunctivae are normal.  Neck: Neck supple.  Cardiovascular: Normal rate, regular rhythm, normal heart sounds and intact distal pulses.   Pulmonary/Chest: Effort normal and breath sounds normal. No respiratory distress.  Abdominal: Soft. There is no tenderness. There is no guarding.  Musculoskeletal: She exhibits no edema.  Lymphadenopathy:    She has no cervical adenopathy.  Neurological: She is alert.  Skin: Skin is warm and dry. She is not diaphoretic.  Very large (at least 11x9 cm) tumor in left lateral axilla. Large areas of open wounds with purulent-appearing discharge and foul odor.  Psychiatric: She has a normal mood and affect. Her behavior is normal.  Nursing note and vitals reviewed.                ED Treatments / Results  Labs (all labs ordered are listed, but only abnormal results are displayed) Labs Reviewed  COMPREHENSIVE METABOLIC PANEL - Abnormal; Notable for the following:       Result Value   Glucose, Bld 122 (*)    Total Protein 8.2 (*)    All other components within normal limits  CBC WITH DIFFERENTIAL/PLATELET - Abnormal; Notable for the following:    WBC 10.8 (*)    Hemoglobin 11.7 (*)    HCT 35.9 (*)    Platelets 495 (*)    Neutro Abs 8.6 (*)    All other components within normal limits  CULTURE, BLOOD (ROUTINE X 2)  CULTURE, BLOOD (ROUTINE X 2)  AEROBIC CULTURE (SUPERFICIAL SPECIMEN)  LIPASE, BLOOD  PROTIME-INR  APTT    EKG  EKG Interpretation None       Radiology Ct Head Wo Contrast  Result Date: 10/28/2016 CLINICAL DATA:  Left breast mass, diagnosed with breast cancer question metastatic disease EXAM: CT HEAD WITHOUT CONTRAST TECHNIQUE: Contiguous axial images were obtained from the  base of the skull through the vertex without intravenous contrast. COMPARISON:  04/07/2007 FINDINGS: Brain: No acute territorial infarction, intracranial hemorrhage, or extra-axial fluid collection. No focal mass, mass effect or midline shift. The ventricles are nonenlarged. Vascular: No hyperdense vessels.  No unexpected calcifications. Skull: Normal. Negative for fracture or focal lesion. Sinuses/Orbits: No acute finding. Other: None IMPRESSION: No CT evidence for acute intracranial abnormality. Electronically Signed   By: Donavan Foil M.D.   On: 10/28/2016 19:53   Ct Chest W Contrast  Result Date: 10/28/2016 CLINICAL DATA:  L breast mass. Pt states she had mass for one year and mass became infected in May, diagnosed with breast cancer. Pt seen in May and treated with antibiotics. Pt applies dressing herself, wound with green bloody drainage, foul odor. Pt states she completed antibiotics in May, reports mild pain.  Pt due to start treatment for cancer next week. EXAM: CT CHEST, ABDOMEN, AND PELVIS WITH CONTRAST TECHNIQUE: Multidetector CT imaging of the chest, abdomen and pelvis was performed following the standard protocol during bolus administration of intravenous contrast. CONTRAST:  119mL ISOVUE-300 IOPAMIDOL (ISOVUE-300) INJECTION 61% COMPARISON:  09/25/2016 and previous studies FINDINGS: CT CHEST FINDINGS Cardiovascular: Heart size normal. No aortic aneurysm. No pericardial effusion. Mediastinum/Nodes: 18 mm low-attenuation right thyroid lesion, stable. No mediastinal adenopathy. Left axillary and subpectoral adenopathy as before. Lungs/Pleura: No pleural effusion. No pneumothorax. Multiple pulmonary nodules in all lobes, largest 19 mm in the inferior lingula image 85/6, previously 16 mm. Musculoskeletal: Irregular partially exophytic left breast/chest wall mass measuring at least 11.8 cm, with adjacent skin thickening and hypervascularity. Stable mild superior endplate compression deformity of T10. No  acute fracture. No worrisome bone lesion. CT ABDOMEN PELVIS FINDINGS Hepatobiliary: 18 mm lesion in segment 5 showing peripheral puddling of on the initial phase, filling in on delayed study, suggesting hemangioma, partially seen on previous study 05/06/2016. There is a poorly marginated 16 mm low-attenuation lesion in segment 2 image 43/2, which was not present on the prior study. Gallbladder is nondilated. No biliary ductal dilatation. Pancreas: Prominence of the pancreatic duct. Spleen: Normal in size without focal abnormality. Adrenals/Urinary Tract: Probable exophytic cyst from the mid right kidney measuring 2.2 cm. No hydronephrosis or solid renal mass. Normal adrenals. Urinary bladder is nondistended. Stomach/Bowel: Limited evaluation without oral contrast. The stomach, small bowel, and colon are nondilated. Moderate colonic fecal material. The appendix is not discretely identified. Vascular/Lymphatic: Circumaortic left renal vein, an anatomic variant. No significant vascular findings are present.There is a 2.4 cm mass with central low-attenuation mass which abuts hepatic segment 3 and the pancreatic neck ; this area was not evaluated on prior chest scans. No other suggestion of abdominal or pelvic adenopathy. Reproductive: Uterus and bilateral adnexa are unremarkable. Other: No ascites.  No free air. Musculoskeletal: Negative for fracture or worrisome osseous lesion. IMPRESSION: 1. No acute findings. 2. Left breast/chest wall mass with slight progression of pulmonary metastatic disease. 3. Stable left axillary and subpectoral adenopathy. 4. 2.4 cm mass near the porta hepatis between the left lateral hepatic lobe and pancreatic neck possibly metastatic adenopathy. 5. New 16 mm hepatic segment 2 lesion suspicious for hepatic metastatic disease. Electronically Signed   By: Lucrezia Europe M.D.   On: 10/28/2016 20:06   Ct Abdomen Pelvis W Contrast  Result Date: 10/28/2016 CLINICAL DATA:  L breast mass. Pt states  she had mass for one year and mass became infected in May, diagnosed with breast cancer. Pt seen in May and treated with antibiotics. Pt applies dressing herself, wound with green bloody drainage, foul odor. Pt states she completed antibiotics in May, reports mild pain. Pt due to start treatment for cancer next week. EXAM: CT CHEST, ABDOMEN, AND PELVIS WITH CONTRAST TECHNIQUE: Multidetector CT imaging of the chest, abdomen and pelvis was performed following the standard protocol during bolus administration of intravenous contrast. CONTRAST:  176mL ISOVUE-300 IOPAMIDOL (ISOVUE-300) INJECTION 61% COMPARISON:  09/25/2016 and previous studies FINDINGS: CT CHEST FINDINGS Cardiovascular: Heart size normal. No aortic aneurysm. No pericardial effusion. Mediastinum/Nodes: 18 mm low-attenuation right thyroid lesion, stable. No mediastinal adenopathy. Left axillary and subpectoral adenopathy as before. Lungs/Pleura: No pleural effusion. No pneumothorax. Multiple pulmonary nodules in all lobes, largest 19 mm in the inferior lingula image 85/6, previously 16 mm. Musculoskeletal: Irregular partially exophytic left breast/chest wall mass measuring at least 11.8 cm, with adjacent skin  thickening and hypervascularity. Stable mild superior endplate compression deformity of T10. No acute fracture. No worrisome bone lesion. CT ABDOMEN PELVIS FINDINGS Hepatobiliary: 18 mm lesion in segment 5 showing peripheral puddling of on the initial phase, filling in on delayed study, suggesting hemangioma, partially seen on previous study 05/06/2016. There is a poorly marginated 16 mm low-attenuation lesion in segment 2 image 43/2, which was not present on the prior study. Gallbladder is nondilated. No biliary ductal dilatation. Pancreas: Prominence of the pancreatic duct. Spleen: Normal in size without focal abnormality. Adrenals/Urinary Tract: Probable exophytic cyst from the mid right kidney measuring 2.2 cm. No hydronephrosis or solid renal  mass. Normal adrenals. Urinary bladder is nondistended. Stomach/Bowel: Limited evaluation without oral contrast. The stomach, small bowel, and colon are nondilated. Moderate colonic fecal material. The appendix is not discretely identified. Vascular/Lymphatic: Circumaortic left renal vein, an anatomic variant. No significant vascular findings are present.There is a 2.4 cm mass with central low-attenuation mass which abuts hepatic segment 3 and the pancreatic neck ; this area was not evaluated on prior chest scans. No other suggestion of abdominal or pelvic adenopathy. Reproductive: Uterus and bilateral adnexa are unremarkable. Other: No ascites.  No free air. Musculoskeletal: Negative for fracture or worrisome osseous lesion. IMPRESSION: 1. No acute findings. 2. Left breast/chest wall mass with slight progression of pulmonary metastatic disease. 3. Stable left axillary and subpectoral adenopathy. 4. 2.4 cm mass near the porta hepatis between the left lateral hepatic lobe and pancreatic neck possibly metastatic adenopathy. 5. New 16 mm hepatic segment 2 lesion suspicious for hepatic metastatic disease. Electronically Signed   By: Lucrezia Europe M.D.   On: 10/28/2016 20:06    Procedures Procedures (including critical care time)  Medications Ordered in ED Medications  iopamidol (ISOVUE-300) 61 % injection (not administered)  ceFAZolin (ANCEF) IVPB 1 g/50 mL premix (0 g Intravenous Stopped 10/28/16 1909)  sodium chloride 0.9 % bolus 1,000 mL (1,000 mLs Intravenous New Bag/Given 10/28/16 1942)  iopamidol (ISOVUE-300) 61 % injection 100 mL (100 mLs Intravenous Contrast Given 10/28/16 1911)  acetaminophen (TYLENOL) tablet 1,000 mg (1,000 mg Oral Given 10/28/16 1941)     Initial Impression / Assessment and Plan / ED Course  I have reviewed the triage vital signs and the nursing notes.  Pertinent labs & imaging results that were available during my care of the patient were reviewed by me and considered in my medical  decision making (see chart for details).  Clinical Course as of Oct 29 2026  Wed Oct 28, 2016  1923 Spoke with Dr. Hal Hope, hospitalist, who states he will admit following CT results. Will need to contact general surgery if abscess seen on CT.   [SJ]    Clinical Course User Index [SJ] Leonarda Leis C, PA-C    Patient presents with a worsening wound due to underlying metastatic breast cancer. She is chronically ill appearing, but is not presenting with signs of sepsis. There is worsening metastatic disease on CT, but no evidence of abscess. Suspect patient's progressively worsening tumor and wound condition are complicated by the patient's failure to follow up. Admission for wound care, IV antibiotics, and coordinated treatment of patient's cancer.  Findings and plan of care discussed with Isla Pence, MD. Dr. Gilford Raid personally evaluated and examined this patient.  Vitals:   10/28/16 1131 10/28/16 1514 10/28/16 2010  BP: (!) 141/74 (!) 139/96 128/79  Pulse: 82 96 82  Resp: 16 18 19   Temp: 98.2 F (36.8 C)  97.8 F (36.6 C)  TempSrc: Oral  Oral  SpO2: 100% 99% 98%      Final Clinical Impressions(s) / ED Diagnoses   Final diagnoses:  Metastatic breast cancer ([place])  Liver metastases ([place])  Malignant neoplasm metastatic to lung, unspecified laterality ([place])  Wound infection    New Prescriptions New Prescriptions   No medications on file     Layla Maw 10/28/16 2029    Lorayne Bender, PA-C 10/28/16 2030    Isla Pence, MD 10/28/16 2215

## 2016-10-28 NOTE — ED Triage Notes (Signed)
Pt with L breast mass. Pt states she had mass for one year and mass became infected in May, diagnosed with breast cancer. Pt seen in May and treated with antibiotics. Pt applies dressing herself, wound with green bloody drainage, foul odor. Pt states she completed antibiotics in May, reports mild pain. Pt due to start treatment for cancer next week.

## 2016-10-29 DIAGNOSIS — N61 Mastitis without abscess: Principal | ICD-10-CM

## 2016-10-29 DIAGNOSIS — Z886 Allergy status to analgesic agent status: Secondary | ICD-10-CM | POA: Diagnosis not present

## 2016-10-29 DIAGNOSIS — D638 Anemia in other chronic diseases classified elsewhere: Secondary | ICD-10-CM | POA: Diagnosis present

## 2016-10-29 DIAGNOSIS — C50919 Malignant neoplasm of unspecified site of unspecified female breast: Secondary | ICD-10-CM | POA: Diagnosis present

## 2016-10-29 DIAGNOSIS — R7989 Other specified abnormal findings of blood chemistry: Secondary | ICD-10-CM | POA: Diagnosis present

## 2016-10-29 DIAGNOSIS — C7800 Secondary malignant neoplasm of unspecified lung: Secondary | ICD-10-CM

## 2016-10-29 DIAGNOSIS — Z7189 Other specified counseling: Secondary | ICD-10-CM | POA: Diagnosis not present

## 2016-10-29 DIAGNOSIS — C787 Secondary malignant neoplasm of liver and intrahepatic bile duct: Secondary | ICD-10-CM | POA: Diagnosis not present

## 2016-10-29 DIAGNOSIS — Z515 Encounter for palliative care: Secondary | ICD-10-CM | POA: Diagnosis not present

## 2016-10-29 DIAGNOSIS — E43 Unspecified severe protein-calorie malnutrition: Secondary | ICD-10-CM | POA: Diagnosis present

## 2016-10-29 DIAGNOSIS — Z590 Homelessness: Secondary | ICD-10-CM | POA: Diagnosis not present

## 2016-10-29 DIAGNOSIS — Z79899 Other long term (current) drug therapy: Secondary | ICD-10-CM | POA: Diagnosis not present

## 2016-10-29 DIAGNOSIS — C50911 Malignant neoplasm of unspecified site of right female breast: Secondary | ICD-10-CM

## 2016-10-29 LAB — BASIC METABOLIC PANEL
Anion gap: 5 (ref 5–15)
BUN: 11 mg/dL (ref 6–20)
CALCIUM: 8.6 mg/dL — AB (ref 8.9–10.3)
CO2: 27 mmol/L (ref 22–32)
CREATININE: 0.71 mg/dL (ref 0.44–1.00)
Chloride: 107 mmol/L (ref 101–111)
GFR calc Af Amer: >60 mL/min (ref >60)
Glucose, Bld: 103 mg/dL — ABNORMAL HIGH (ref 65–99)
POTASSIUM: 3.5 mmol/L (ref 3.5–5.1)
SODIUM: 139 mmol/L (ref 135–145)

## 2016-10-29 LAB — CBC
HEMATOCRIT: 29.8 % — AB (ref 36.0–46.0)
Hemoglobin: 9.4 g/dL — ABNORMAL LOW (ref 12.0–15.0)
MCH: 26 pg (ref 26.0–34.0)
MCHC: 31.5 g/dL (ref 30.0–36.0)
MCV: 82.5 fL (ref 78.0–100.0)
PLATELETS: 390 10*3/uL (ref 150–400)
RBC: 3.61 MIL/uL — ABNORMAL LOW (ref 3.87–5.11)
RDW: 13.3 % (ref 11.5–15.5)
WBC: 9 10*3/uL (ref 4.0–10.5)

## 2016-10-29 MED ORDER — ADULT MULTIVITAMIN W/MINERALS CH
1.0000 | ORAL_TABLET | Freq: Every day | ORAL | Status: DC
  Administered 2016-10-29 – 2016-11-02 (×5): 1 via ORAL
  Filled 2016-10-29 (×5): qty 1

## 2016-10-29 MED ORDER — DAKINS (1/4 STRENGTH) 0.125 % EX SOLN
Freq: Two times a day (BID) | CUTANEOUS | Status: AC
  Administered 2016-10-29 – 2016-10-31 (×6)
  Filled 2016-10-29: qty 473

## 2016-10-29 MED ORDER — OXYCODONE-ACETAMINOPHEN 5-325 MG PO TABS
1.0000 | ORAL_TABLET | ORAL | Status: DC | PRN
  Administered 2016-10-29: 1 via ORAL
  Administered 2016-10-30 – 2016-11-02 (×14): 2 via ORAL
  Filled 2016-10-29 (×11): qty 2
  Filled 2016-10-29: qty 1
  Filled 2016-10-29 (×5): qty 2

## 2016-10-29 MED ORDER — VANCOMYCIN HCL 500 MG IV SOLR
500.0000 mg | Freq: Two times a day (BID) | INTRAVENOUS | Status: DC
  Administered 2016-10-29 – 2016-10-31 (×5): 500 mg via INTRAVENOUS
  Filled 2016-10-29 (×6): qty 500

## 2016-10-29 MED ORDER — PIPERACILLIN-TAZOBACTAM 3.375 G IVPB
3.3750 g | Freq: Three times a day (TID) | INTRAVENOUS | Status: DC
  Administered 2016-10-29 – 2016-11-02 (×13): 3.375 g via INTRAVENOUS
  Filled 2016-10-29 (×14): qty 50

## 2016-10-29 NOTE — Progress Notes (Signed)
Initial Nutrition Assessment  DOCUMENTATION CODES:   Severe malnutrition in context of acute illness/injury  INTERVENTION:   -Provide Ensure Enlive po TID, each supplement provides 350 kcal and 20 grams of protein -Provide Magic cup TID with meals, each supplement provides 290 kcal and 9 grams of protein -Provide Multivitamin with minerals daily -RD to continue to monitor  NUTRITION DIAGNOSIS:   Malnutrition (Severe) related to cancer and cancer related treatments, acute illness (breast wound) as evidenced by percent weight loss, moderate depletion of body fat, moderate depletions of muscle mass.  GOAL:   Patient will meet greater than or equal to 90% of their needs  MONITOR:   PO intake, Supplement acceptance, Labs, Weight trends, Skin, I & O's  REASON FOR ASSESSMENT:   Malnutrition Screening Tool    ASSESSMENT:   61 y.o. female with metastatic breast cancer with fungating mass of the left breast were has until recently not sought medical care and was under homeopathic medications and was recently admitted for left breast cellulitis presents to the ER because of increasing drainage from the left breast fungating mass. Patient states since recent discharge patient was doing well with decreasing discharge and decreasing use of pads for the drainage. Since the antibiotic was over patient's discharge increased over the last 3 days. Denies any fever or chills.   Patient in room with no family at bedside. Per discussion with RN prior to visit, pt with foul smelling breast wound, in need of some sort of nutrition intervention. Pt homeless and has not been compliant with cancer treatments. Pt reports good appetite and eating her meals. Pt states she is planning to drink her Ensure supplements. Pt like these but has hard time affording them at home. Provided examples of more affordable options for after discharge. Recommend pt consume at least 3 a day given extent of wound.  Pt is willing  to try Magic cups as well. Will place order.  Per chart review, pt has lost 17 lb since 5/4 (13% wt loss x 1 month, significant for time frame). Nutrition-Focused physical exam completed. Findings are moderate fat depletion, moderate muscle depletion, and no edema.   Labs reviewed. Medications reviewed.  Diet Order:  Diet regular Room service appropriate? Yes; Fluid consistency: Thin  Skin:  Wound (see comment) (L breast cancerous wound)  Last BM:  6/6  Height:   Ht Readings from Last 1 Encounters:  10/28/16 5\' 1"  (1.549 m)    Weight:   Wt Readings from Last 1 Encounters:  10/28/16 113 lb 8.6 oz (51.5 kg)    Ideal Body Weight:  47.7 kg  BMI:  Body mass index is 21.45 kg/m.  Estimated Nutritional Needs:   Kcal:  1700-1900  Protein:  90-100g  Fluid:  2L/day  EDUCATION NEEDS:   Education needs addressed  Clayton Bibles, MS, RD, LDN Pager: [phone] After Hours Pager: [phone]

## 2016-10-29 NOTE — Consult Note (Signed)
[facility] Nurse wound consult note Reason for Consult: Left breast wound Wound type: outward growth tumor with full thickness openings, necrosis, slough Pressure Injury POA: NA Drainage (amount, consistency, odor) copious, malodorous Periwound:macerated Dressing procedure/placement/freq: I have provided nurses with orders for Cleanse breast mass with NS, pat dry, apply Dakin's Solution 0.125% dampened gauze to site, cover with dry gauze, ABD to contain drainage, perform BID. I have consulted with member of [facility] team, Para March, who saw the wound recently and a surgical consult is recommended. Please order if you agree.  We will sign off for now but please consult [place] again if needed after surgical consult decision is complete.   Fara Olden, RN-C, WTA-C Wound Treatment Associate

## 2016-10-29 NOTE — H&P (Signed)
History and Physical    Nikya Busler GNF:[id number] DOB: 1955/05/30 DOA: 10/28/2016  PCP: [facility], [facility]  Patient coming from: Home.  Chief Complaint: Left breast increasing drainage from the wound.  HPI: Ann Oliver is a 61 y.o. female with metastatic breast cancer with fungating mass of the left breast were has until recently not sought medical care and was under homeopathic medications and was recently admitted for left breast cellulitis presents to the ER because of increasing drainage from the left breast fungating mass. Patient states since recent discharge patient was doing well with decreasing discharge and decreasing use of pads for the drainage. Since the antibiotic was over patient's discharge increased over the last 3 days. Denies any fever or chills.   ED Course: In the ER CAT scan does not show any definite abscess. On exam patient has active discharge from the fungating mass from the left breast. Patient admitted for IV antibiotics and further observation.  Review of Systems: As per HPI, rest all negative.   Past Medical History:  Diagnosis Date  . Cancer ([place])   . Sinusitis   . Tumor cells     Past Surgical History:  Procedure Laterality Date  . BIOPSY BREAST       reports that she has never smoked. She has never used smokeless tobacco. She reports that she does not drink alcohol or use drugs.  Allergies  Allergen Reactions  . Aspirin Palpitations    Family History  Problem Relation Age of Onset  . Breast cancer Neg Hx     Prior to Admission medications   Medication Sig Start Date End Date Taking? Authorizing Provider  acetaminophen (TYLENOL) 500 MG tablet Take 2 tablets (1,000 mg total) by mouth every 6 (six) hours as needed. Patient taking differently: Take 1,000 mg by mouth every 6 (six) hours as needed for moderate pain.  10/09/16  Yes Charlesetta Shanks, MD  feeding supplement, ENSURE ENLIVE, (ENSURE ENLIVE) LIQD Take 237 mLs by mouth 3  (three) times daily between meals. 09/26/16  Yes [name], MD  ibuprofen (ADVIL,MOTRIN) 200 MG tablet Take 400 mg by mouth every 4 (four) hours as needed for mild pain or moderate pain.   Yes [provider]  traMADol (ULTRAM) 50 MG tablet Take 1 tablet (50 mg total) by mouth every 6 (six) hours as needed. 10/01/16  Yes Nicholas Lose, MD  lidocaine-prilocaine (EMLA) cream Apply to affected area once Patient not taking: Reported on 10/23/2016 10/01/16   Nicholas Lose, MD  ondansetron (ZOFRAN) 8 MG tablet Take 1 tablet (8 mg total) by mouth 2 (two) times daily as needed (Nausea or vomiting). Patient not taking: Reported on 10/14/2016 10/01/16   Nicholas Lose, MD  prochlorperazine (COMPAZINE) 10 MG tablet Take 1 tablet (10 mg total) by mouth every 6 (six) hours as needed (Nausea or vomiting). Patient not taking: Reported on 10/14/2016 10/01/16   Nicholas Lose, MD    Physical Exam: Vitals:   10/28/16 1131 10/28/16 1514 10/28/16 2010 10/28/16 2132  BP: (!) 141/74 (!) 139/96 128/79 140/69  Pulse: 82 96 82 92  Resp: 16 18 19 20   Temp: 98.2 F (36.8 C)  97.8 F (36.6 C) 98.1 F (36.7 C)  TempSrc: Oral  Oral Oral  SpO2: 100% 99% 98% 100%  Weight:    51.5 kg (113 lb 8.6 oz)  Height:    5\' 1"  (1.549 m)      Constitutional: Moderately built and poorly nourished. Vitals:   10/28/16 1131  10/28/16 1514 10/28/16 2010 10/28/16 2132  BP: (!) 141/74 (!) 139/96 128/79 140/69  Pulse: 82 96 82 92  Resp: 16 18 19 20   Temp: 98.2 F (36.8 C)  97.8 F (36.6 C) 98.1 F (36.7 C)  TempSrc: Oral  Oral Oral  SpO2: 100% 99% 98% 100%  Weight:    51.5 kg (113 lb 8.6 oz)  Height:    5\' 1"  (1.549 m)   Eyes: Anicteric no pallor. ENMT: No discharge from the ears eyes nose and mouth. Neck: No mass felt. No neck rigidity. Respiratory: No rhonchi or crepitations. Cardiovascular: S1-S2 heard no murmurs appreciated. Abdomen: Soft nontender bowel sounds present. Musculoskeletal: No edema. No joint  effusion. Skin: Left breast has fungating mass with active discharge. Neurologic: Alert awake oriented to time place and person. Moves all extremities. Psychiatric: Appears normal. Normal affect.   Labs on Admission: I have personally reviewed following labs and imaging studies  CBC:  Recent Labs Lab 10/28/16 1644 10/29/16 0412  WBC 10.8* 9.0  NEUTROABS 8.6*  --   HGB 11.7* 9.4*  HCT 35.9* 29.8*  MCV 82.3 82.5  PLT 495* 510   Basic Metabolic Panel:  Recent Labs Lab 10/28/16 1644 10/29/16 0412  NA 140 139  K 3.8 3.5  CL 103 107  CO2 28 27  GLUCOSE 122* 103*  BUN 15 11  CREATININE 0.65 0.71  CALCIUM 9.7 8.6*   GFR: Estimated Creatinine Clearance: 55 mL/min (by C-G formula based on SCr of 0.71 mg/dL). Liver Function Tests:  Recent Labs Lab 10/28/16 1644  AST 30  ALT 18  ALKPHOS 90  BILITOT 0.3  PROT 8.2*  ALBUMIN 4.3    Recent Labs Lab 10/28/16 1644  LIPASE 29   No results for input(s): AMMONIA in the last 168 hours. Coagulation Profile:  Recent Labs Lab 10/28/16 1720  INR 0.94   Cardiac Enzymes: No results for input(s): CKTOTAL, CKMB, CKMBINDEX, TROPONINI in the last 168 hours. BNP (last 3 results) No results for input(s): PROBNP in the last 8760 hours. HbA1C: No results for input(s): HGBA1C in the last 72 hours. CBG: No results for input(s): GLUCAP in the last 168 hours. Lipid Profile: No results for input(s): CHOL, HDL, LDLCALC, TRIG, CHOLHDL, LDLDIRECT in the last 72 hours. Thyroid Function Tests: No results for input(s): TSH, T4TOTAL, FREET4, T3FREE, THYROIDAB in the last 72 hours. Anemia Panel: No results for input(s): VITAMINB12, FOLATE, FERRITIN, TIBC, IRON, RETICCTPCT in the last 72 hours. Urine analysis:    Component Value Date/Time   COLORURINE YELLOW 09/25/2016 0230   APPEARANCEUR CLEAR 09/25/2016 0230   LABSPEC 1.019 09/25/2016 0230   PHURINE 5.0 09/25/2016 0230   GLUCOSEU NEGATIVE 09/25/2016 0230   HGBUR NEGATIVE  09/25/2016 0230   BILIRUBINUR NEGATIVE 09/25/2016 0230   KETONESUR 5 (A) 09/25/2016 0230   PROTEINUR NEGATIVE 09/25/2016 0230   NITRITE NEGATIVE 09/25/2016 0230   LEUKOCYTESUR NEGATIVE 09/25/2016 0230   Sepsis Labs: @LABRCNTIP (procalcitonin:4,lacticidven:4) ) Recent Results (from the past 240 hour(s))  Culture, blood (routine x 2)     Status: None (Preliminary result)   Collection Time: 10/28/16  5:29 PM  Result Value Ref Range Status   Specimen Description   Final    BLOOD RIGHT ANTECUBITAL Performed at [facility], [place] [address] [place], [place] [address]    Special Requests   Final    BOTTLES DRAWN AEROBIC AND ANAEROBIC Blood Culture adequate volume   Culture PENDING  Incomplete   Report Status PENDING  Incomplete  Radiological Exams on Admission: Ct Head Wo Contrast  Result Date: 10/28/2016 CLINICAL DATA:  Left breast mass, diagnosed with breast cancer question metastatic disease EXAM: CT HEAD WITHOUT CONTRAST TECHNIQUE: Contiguous axial images were obtained from the base of the skull through the vertex without intravenous contrast. COMPARISON:  04/07/2007 FINDINGS: Brain: No acute territorial infarction, intracranial hemorrhage, or extra-axial fluid collection. No focal mass, mass effect or midline shift. The ventricles are nonenlarged. Vascular: No hyperdense vessels.  No unexpected calcifications. Skull: Normal. Negative for fracture or focal lesion. Sinuses/Orbits: No acute finding. Other: None IMPRESSION: No CT evidence for acute intracranial abnormality. Electronically Signed   By: Donavan Foil M.D.   On: 10/28/2016 19:53   Ct Chest W Contrast  Result Date: 10/28/2016 CLINICAL DATA:  L breast mass. Pt states she had mass for one year and mass became infected in May, diagnosed with breast cancer. Pt seen in May and treated with antibiotics. Pt applies dressing herself, wound with green bloody drainage, foul odor. Pt states she completed antibiotics in May, reports  mild pain. Pt due to start treatment for cancer next week. EXAM: CT CHEST, ABDOMEN, AND PELVIS WITH CONTRAST TECHNIQUE: Multidetector CT imaging of the chest, abdomen and pelvis was performed following the standard protocol during bolus administration of intravenous contrast. CONTRAST:  169mL ISOVUE-300 IOPAMIDOL (ISOVUE-300) INJECTION 61% COMPARISON:  09/25/2016 and previous studies FINDINGS: CT CHEST FINDINGS Cardiovascular: Heart size normal. No aortic aneurysm. No pericardial effusion. Mediastinum/Nodes: 18 mm low-attenuation right thyroid lesion, stable. No mediastinal adenopathy. Left axillary and subpectoral adenopathy as before. Lungs/Pleura: No pleural effusion. No pneumothorax. Multiple pulmonary nodules in all lobes, largest 19 mm in the inferior lingula image 85/6, previously 16 mm. Musculoskeletal: Irregular partially exophytic left breast/chest wall mass measuring at least 11.8 cm, with adjacent skin thickening and hypervascularity. Stable mild superior endplate compression deformity of T10. No acute fracture. No worrisome bone lesion. CT ABDOMEN PELVIS FINDINGS Hepatobiliary: 18 mm lesion in segment 5 showing peripheral puddling of on the initial phase, filling in on delayed study, suggesting hemangioma, partially seen on previous study 05/06/2016. There is a poorly marginated 16 mm low-attenuation lesion in segment 2 image 43/2, which was not present on the prior study. Gallbladder is nondilated. No biliary ductal dilatation. Pancreas: Prominence of the pancreatic duct. Spleen: Normal in size without focal abnormality. Adrenals/Urinary Tract: Probable exophytic cyst from the mid right kidney measuring 2.2 cm. No hydronephrosis or solid renal mass. Normal adrenals. Urinary bladder is nondistended. Stomach/Bowel: Limited evaluation without oral contrast. The stomach, small bowel, and colon are nondilated. Moderate colonic fecal material. The appendix is not discretely identified. Vascular/Lymphatic:  Circumaortic left renal vein, an anatomic variant. No significant vascular findings are present.There is a 2.4 cm mass with central low-attenuation mass which abuts hepatic segment 3 and the pancreatic neck ; this area was not evaluated on prior chest scans. No other suggestion of abdominal or pelvic adenopathy. Reproductive: Uterus and bilateral adnexa are unremarkable. Other: No ascites.  No free air. Musculoskeletal: Negative for fracture or worrisome osseous lesion. IMPRESSION: 1. No acute findings. 2. Left breast/chest wall mass with slight progression of pulmonary metastatic disease. 3. Stable left axillary and subpectoral adenopathy. 4. 2.4 cm mass near the porta hepatis between the left lateral hepatic lobe and pancreatic neck possibly metastatic adenopathy. 5. New 16 mm hepatic segment 2 lesion suspicious for hepatic metastatic disease. Electronically Signed   By: Lucrezia Europe M.D.   On: 10/28/2016 20:06   Ct Abdomen Pelvis W Contrast  Result Date: 10/28/2016 CLINICAL DATA:  L breast mass. Pt states she had mass for one year and mass became infected in May, diagnosed with breast cancer. Pt seen in May and treated with antibiotics. Pt applies dressing herself, wound with green bloody drainage, foul odor. Pt states she completed antibiotics in May, reports mild pain. Pt due to start treatment for cancer next week. EXAM: CT CHEST, ABDOMEN, AND PELVIS WITH CONTRAST TECHNIQUE: Multidetector CT imaging of the chest, abdomen and pelvis was performed following the standard protocol during bolus administration of intravenous contrast. CONTRAST:  125mL ISOVUE-300 IOPAMIDOL (ISOVUE-300) INJECTION 61% COMPARISON:  09/25/2016 and previous studies FINDINGS: CT CHEST FINDINGS Cardiovascular: Heart size normal. No aortic aneurysm. No pericardial effusion. Mediastinum/Nodes: 18 mm low-attenuation right thyroid lesion, stable. No mediastinal adenopathy. Left axillary and subpectoral adenopathy as before. Lungs/Pleura: No  pleural effusion. No pneumothorax. Multiple pulmonary nodules in all lobes, largest 19 mm in the inferior lingula image 85/6, previously 16 mm. Musculoskeletal: Irregular partially exophytic left breast/chest wall mass measuring at least 11.8 cm, with adjacent skin thickening and hypervascularity. Stable mild superior endplate compression deformity of T10. No acute fracture. No worrisome bone lesion. CT ABDOMEN PELVIS FINDINGS Hepatobiliary: 18 mm lesion in segment 5 showing peripheral puddling of on the initial phase, filling in on delayed study, suggesting hemangioma, partially seen on previous study 05/06/2016. There is a poorly marginated 16 mm low-attenuation lesion in segment 2 image 43/2, which was not present on the prior study. Gallbladder is nondilated. No biliary ductal dilatation. Pancreas: Prominence of the pancreatic duct. Spleen: Normal in size without focal abnormality. Adrenals/Urinary Tract: Probable exophytic cyst from the mid right kidney measuring 2.2 cm. No hydronephrosis or solid renal mass. Normal adrenals. Urinary bladder is nondistended. Stomach/Bowel: Limited evaluation without oral contrast. The stomach, small bowel, and colon are nondilated. Moderate colonic fecal material. The appendix is not discretely identified. Vascular/Lymphatic: Circumaortic left renal vein, an anatomic variant. No significant vascular findings are present.There is a 2.4 cm mass with central low-attenuation mass which abuts hepatic segment 3 and the pancreatic neck ; this area was not evaluated on prior chest scans. No other suggestion of abdominal or pelvic adenopathy. Reproductive: Uterus and bilateral adnexa are unremarkable. Other: No ascites.  No free air. Musculoskeletal: Negative for fracture or worrisome osseous lesion. IMPRESSION: 1. No acute findings. 2. Left breast/chest wall mass with slight progression of pulmonary metastatic disease. 3. Stable left axillary and subpectoral adenopathy. 4. 2.4 cm mass  near the porta hepatis between the left lateral hepatic lobe and pancreatic neck possibly metastatic adenopathy. 5. New 16 mm hepatic segment 2 lesion suspicious for hepatic metastatic disease. Electronically Signed   By: Lucrezia Europe M.D.   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 Cellulitis of the left breast fungating mass - I have placed patient on vancomycin and Zosyn. We'll get wound team consult. CT scan does not show any abscess. 2. Metastatic breast cancer - patient is to receive palliative chemotherapy next week. 3. Normocytic normochromic anemia - probably from metastatic cancer.  Patient's family is not aware of patient's diagnoses of breast cancer as per previous chart. Patient does not like her family to know per the previous chart.   DVT prophylaxis: SCDs. Code Status: Full code.  Family Communication: Discussed with  patient.  Disposition Plan: Home.  Consults called: Wound team.  Admission status: Observation.    Rise Patience MD [facility] Pager [phone].  If 7PM-7AM, please contact night-coverage [email or web address] Password [facility]  10/29/2016, 6:13 AM

## 2016-10-29 NOTE — Progress Notes (Signed)
Pt seen and examined at bedside, admitted after midnight. Please see earlier admission note by Dr. Hal Hope. Pt admitted with diagnosis of left breast cellulitis. Pt placed on vanc and zosyn. Continue same regimen for now. CBC and BMP in AM.  Faye Ramsay, MD  [facility] Pager [phone]  If 7PM-7AM, please contact night-coverage [email or web address] Password [email or web address]

## 2016-10-29 NOTE — Progress Notes (Signed)
   10/29/16 1000  Clinical Encounter Type  Visited With Patient  Visit Type Initial;Psychological support;Spiritual support  Referral From Nurse  Consult/Referral To Chaplain  Spiritual Encounters  Spiritual Needs Prayer;Emotional;Other (Comment) (Pastoral Conversation/Support)  Stress Factors  Patient Stress Factors Health changes;Major life changes;Other (Comment) (Spiritual journey )   I visited with the patient per referral from the nurse who stated that the patient could use a visit.  The patient was extremely receptive to my visit and began telling me that she believes that her health struggles are a part of her, "testimony." The idea of having a testimony is very important to the patient and she talked about her theological understandings of "trials" in life. Ms. swetha rayle herself to Shanon Brow and Jo[place] in the [place].  She feels that she is in the "pit" right now and that God is going to "pull her out."  Ms. Lambe requested that I pray for God to show her what her testimony is going to be and how to be a testimony even through her trials. She also wants to be healed completely and be "better than she was before."  One thing to be aware of for medical staff is that she believes, due to her prayer partners and minister, that she is not supposed to tell when she is in pain. She feels that she is supposed to "praise God through it and not let people know."  Ms. Belenda Cruise and I explored this during our conversation and the importance of letting medical staff know when she is in pain.  I will follow up with the patient.  Please, contact Spiritual Care for further assistance.   [place] M.Div.

## 2016-10-29 NOTE — Progress Notes (Signed)
Pharmacy Antibiotic Note  Ann Oliver is a 61 y.o. female admitted on 10/28/2016 with fungating breast tumor.  Pharmacy has been consulted for Vancomycin and Zosyn dosing.  Plan: Vancomycin 500mg  IV every 12 hours.  Goal trough 15-20 mcg/mL. Zosyn 3.375g IV q8h (4 hour infusion).  Height: 5\' 1"  (154.9 cm) Weight: 113 lb 8.6 oz (51.5 kg) IBW/kg (Calculated) : 47.8  Temp (24hrs), Avg:98 F (36.7 C), Min:97.8 F (36.6 C), Max:98.2 F (36.8 C)   Recent Labs Lab 10/28/16 1644 10/29/16 0412  WBC 10.8* 9.0  CREATININE 0.65 0.71    Estimated Creatinine Clearance: 55 mL/min (by C-G formula based on SCr of 0.71 mg/dL).    Allergies  Allergen Reactions  . Aspirin Palpitations    Antimicrobials this admission: Vancomycin 10/29/2016 >> Zosyn 10/29/2016 >>   Dose adjustments this admission: -  Microbiology results: pending  Thank you for allowing pharmacy to be a part of this patient's care.  Nani Skillern Crowford 10/29/2016 5:39 AM

## 2016-10-29 NOTE — Clinical Social Work Note (Signed)
Clinical Social Work Assessment  Patient Details  Name: Ann Oliver MRN: [id number] Date of Birth: 10-04-1955  Date of referral:  10/29/16               Reason for consult:  Other (Comment Required) (Transportation needs)                Permission sought to share information with:  Family Supports Permission granted to share information::  No  Name::        Agency::     Relationship::     Contact Information:     Housing/Transportation Living arrangements for the past 2 months:   (Pt would not report.) Source of Information:  Patient Patient Interpreter Needed:  None Criminal Activity/Legal Involvement Pertinent to Current Situation/Hospitalization:    Significant Relationships:  Adult Children Lives with:  Self Do you feel safe going back to the place where you live?  Yes Need for family participation in patient care:  No (Coment)  Care giving concerns:  None listed by pt/family   Social Worker assessment / plan:  CSW met with pt and confirmed pt's plan to be discharged to home to her new apartment to live at discharge.  CSW provided active listening and validated pt's concerns.   Pt DID NOT give the CSW permission to complete FL-2 and send referrals out to SNF facilities via the hub per pt's request.  Pt has been living independently prior to being admitted to [facility], but would not elaborate.   Employment status:  Unemployed Forensic scientist:  Medicaid In [place] PT Recommendations:    Information / Referral to community resources:     Patient/Family's Response to care:  Patient alert and oriented.  Patient agreeable to plan.  Per pt, pt's son supportive and strongly involved in pt.'s care.  Pt pleasant and appreciated CSW intervention.   Patient/Family's Understanding of and Emotional Response to Diagnosis, Current Treatment, and Prognosis:  Still assessing  Emotional Assessment Appearance:  Appears stated age Attitude/Demeanor/Rapport:    Affect (typically observed):   Accepting, Adaptable, Pleasant, Calm Orientation:  Oriented to Self, Oriented to Place, Oriented to Situation, Oriented to  Time Alcohol / Substance use:    Psych involvement (Current and /or in the community):     Discharge Needs  Concerns to be addressed:  No discharge needs identified Readmission within the last 30 days:  No Current discharge risk:  None Barriers to Discharge:  No Barriers Identified   Claudine Mouton, LCSWA 10/29/2016, 8:50 PM

## 2016-10-29 NOTE — Progress Notes (Signed)
CSW met with pt due to pt's consult on possible homelessness and transportation needs, who stated, "I have somewhere to go".  When invited by the CSW to give specifics the pt stated, "My son helped me find an apartment, I already have it I just have to sign for it".  CSW provided pt the number for Medicaid transportation in [facility], as well as the number for [employer]  Pt stated she was already signed up for [employer] but hadn't used since the spring.  CSW offered to assist the pt with anything else and pt declined.  Pt was appreciative and thanked the CSW. Pt refused to provide phone number and name of her son who pt stated was supportive of the pt.  Please reconsult if future social work needs arise.  CSW signing off.

## 2016-10-30 LAB — BASIC METABOLIC PANEL
Anion gap: 9 (ref 5–15)
BUN: 14 mg/dL (ref 6–20)
CO2: 27 mmol/L (ref 22–32)
CREATININE: 0.95 mg/dL (ref 0.44–1.00)
Calcium: 9.1 mg/dL (ref 8.9–10.3)
Chloride: 103 mmol/L (ref 101–111)
GFR calc Af Amer: >60 mL/min (ref >60)
Glucose, Bld: 102 mg/dL — ABNORMAL HIGH (ref 65–99)
Potassium: 4.4 mmol/L (ref 3.5–5.1)
SODIUM: 139 mmol/L (ref 135–145)

## 2016-10-30 LAB — CBC
HCT: 30.3 % — ABNORMAL LOW (ref 36.0–46.0)
Hemoglobin: 9.6 g/dL — ABNORMAL LOW (ref 12.0–15.0)
MCH: 26.4 pg (ref 26.0–34.0)
MCHC: 31.7 g/dL (ref 30.0–36.0)
MCV: 83.5 fL (ref 78.0–100.0)
Platelets: 378 10*3/uL (ref 150–400)
RBC: 3.63 MIL/uL — AB (ref 3.87–5.11)
RDW: 13.4 % (ref 11.5–15.5)
WBC: 9.2 10*3/uL (ref 4.0–10.5)

## 2016-10-30 NOTE — Progress Notes (Signed)
Patient ID: Ann Oliver, female   DOB: 02-21-1956, 61 y.o.   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 Patient was initially set up with oncologist but has failed to maintain appropriate follow-up appointments. She was recently admitted from 09/25/2016 until 09/27/2016 for fungating, necrotic infected breast mass extending to axilla, was treated with vancomycin and Zosyn and was transitioned to oral Bactrim with plan to follow up with oncologist. Surgery has also seen patient in evaluation on that admission and has recommended twice-daily wet-to-dry changes. Since the recent discharge, patient says she has not had a chance to follow up with her oncologist and the wound was getting worse, more discharge and foul odor was present. Please note that patient has not shared this with family and prefers to keep this private.   Assessment & Plan:   Principal Problem:   Cellulitis of left breast, fungating necrotic infected breast mass, metastatic breast cancer - Has been on vancomycin and Zosyn, today is day 2 of antibiotics - I will consult with [name], oncologist - We'll also ask wound care team to provide recommendations - Consult with surgical team - There is certainly lack of insight into significance of medical condition - I have discussed with patient consideration of palliative care if she feels that she is not interested and pursuing other medical treatments, patient told me she will think about it - Please note that patient has been evaluated by psychiatry on recent admission and was deemed to have capacity to make decision   Active Problems:   Severe protein calorie malnutrition - Provide in sure 3 times a day,  Magic cup 3 times a day, multivitamin daily - Appreciate nutritionist team assistance    Anemia of likely chronic disease malignancy - Will ask for anemia panel - Hemoglobin drop noted since admission but likely from IV fluids and dilution - No signs of active bleeding - CBC in the morning    Thrombocytosis - Reactive, now resolved    Leukocytosis - Reactive from acute illness, now resolved  DVT prophylaxis: SCDs Code Status: Full Family Communication: Patient at bedside  Disposition Plan: To be determined  Consultants:   Oncology  Surgery  Wound Care team  Procedures:   None  Antimicrobials:   Vancomycin 10/29/2016 -->  Zosyn 10/29/2016 -->  Subjective: Patient reports feeling better, no concerns overnight.  Objective: Vitals:   10/28/16 2132 10/29/16 1410 10/29/16 2151 10/30/16 0610  BP: 140/69 128/68 116/75 (!) 110/98  Pulse: 92 88 94 70  Resp: 20 16 18 16   Temp: 98.1 F (36.7 C) 97.7 F (36.5 C) 98.4 F (36.9 C) 98.1 F (36.7 C)  TempSrc: Oral Oral Oral Oral  SpO2: 100% 100% 100% 100%  Weight: 51.5 kg (113 lb 8.6 oz)     Height: 5\' 1"  (1.549 m)       Intake/Output Summary (Last 24 hours) at 10/30/16 0949 Last data filed at 10/30/16 0836  Gross per 24 hour  Intake             1360 ml  Output              300 ml  Net             1060  ml   Filed Weights   10/28/16 2132  Weight: 51.5 kg (113 lb 8.6 oz)    Examination:  General exam: Appears calm and comfortable  Respiratory system: Clear to auscultation. Respiratory effort normal. Cardiovascular system: S1 & S2 heard, RRR. No JVD, murmurs, rubs, gallops or clicks. No pedal edema. Gastrointestinal system: Abdomen is nondistended, soft and nontender. No organomegaly or masses felt. Normal bowel sounds heard. Central nervous system: Alert and oriented. No focal neurological deficits. Extremities: Symmetric 5 x 5 power. Skin: Fungating left breast mass extending into Section, purulent  drainage and odor  Data Reviewed: I have personally reviewed following labs and imaging studies  CBC:  Recent Labs Lab 10/28/16 1644 10/29/16 0412 10/30/16 0412  WBC 10.8* 9.0 9.2  NEUTROABS 8.6*  --   --   HGB 11.7* 9.4* 9.6*  HCT 35.9* 29.8* 30.3*  MCV 82.3 82.5 83.5  PLT 495* 390 789   Basic Metabolic Panel:  Recent Labs Lab 10/28/16 1644 10/29/16 0412 10/30/16 0412  NA 140 139 139  K 3.8 3.5 4.4  CL 103 107 103  CO2 28 27 27   GLUCOSE 122* 103* 102*  BUN 15 11 14   CREATININE 0.65 0.71 0.95  CALCIUM 9.7 8.6* 9.1   Liver Function Tests:  Recent Labs Lab 10/28/16 1644  AST 30  ALT 18  ALKPHOS 90  BILITOT 0.3  PROT 8.2*  ALBUMIN 4.3    Recent Labs Lab 10/28/16 1644  LIPASE 29   Coagulation Profile:  Recent Labs Lab 10/28/16 1720  INR 0.94   Urine analysis:    Component Value Date/Time   COLORURINE YELLOW 09/25/2016 0230   APPEARANCEUR CLEAR 09/25/2016 0230   LABSPEC 1.019 09/25/2016 0230   PHURINE 5.0 09/25/2016 0230   GLUCOSEU NEGATIVE 09/25/2016 0230   HGBUR NEGATIVE 09/25/2016 0230   BILIRUBINUR NEGATIVE 09/25/2016 0230   KETONESUR 5 (A) 09/25/2016 0230   PROTEINUR NEGATIVE 09/25/2016 0230   NITRITE NEGATIVE 09/25/2016 0230   LEUKOCYTESUR NEGATIVE 09/25/2016 0230   Recent Results (from the past 240 hour(s))  Wound or Superficial Culture     Status: None (Preliminary result)   Collection Time: 10/28/16  4:50 PM  Result Value Ref Range Status   Specimen Description BREAST LEFT UPPER OUTER  Final   Special Requests NONE  Final   Gram Stain   Final    RARE WBC PRESENT, PREDOMINANTLY PMN ABUNDANT GRAM POSITIVE COCCI IN PAIRS IN CLUSTERS ABUNDANT GRAM NEGATIVE RODS FEW GRAM POSITIVE RODS Performed at [facility], [address] [place], [place] [address]    Culture PENDING  Incomplete   Report Status PENDING  Incomplete  Culture, blood (routine x 2)     Status: None (Preliminary result)   Collection Time: 10/28/16  5:29 PM    Result Value Ref Range Status   Specimen Description BLOOD RIGHT ANTECUBITAL  Final   Special Requests   Final    BOTTLES DRAWN AEROBIC AND ANAEROBIC Blood Culture adequate volume   Culture   Final    NO GROWTH < 24 HOURS Performed at [facility], [place] [address] [place], [place] [address]    Report Status PENDING  Incomplete     Radiology Studies: Ct Head Wo Contrast Result Date: 10/28/2016 No CT evidence for acute intracranial abnormality.  Ct Chest W Contrast Ct Abdomen Pelvis W Contrast Result Date: 10/28/2016 1. No acute findings.  2. Left breast/chest wall mass with slight progression of pulmonary metastatic disease.  3. Stable  left axillary and subpectoral adenopathy.  4. 2.4 cm mass near the porta hepatis between the left lateral hepatic lobe and pancreatic neck possibly metastatic adenopathy.  5. New 16 mm hepatic segment 2 lesion suspicious for hepatic metastatic disease.  Scheduled Meds: . feeding supplement (ENSURE ENLIVE)  237 mL Oral TID BM  . multivitamin with minerals  1 tablet Oral Daily  . sodium hypochlorite   Irrigation BID   Continuous Infusions: . piperacillin-tazobactam (ZOSYN)  IV 3.375 g (10/30/16 0616)  . vancomycin Stopped (10/30/16 0716)     LOS: 1 day   Time spent: 35 minutes   Faye Ramsay, MD [facility] Pager [phone]  If 7PM-7AM, please contact night-coverage [email or web address] Password [facility] 10/30/2016, 9:49 AM

## 2016-10-30 NOTE — Progress Notes (Signed)
   10/30/16 1500  Clinical Encounter Type  Visited With Patient  Visit Type Follow-up;Psychological support;Spiritual support  Referral From Patient  Consult/Referral To Chaplain  Spiritual Encounters  Spiritual Needs Emotional;Other (Comment);Prayer (Pastoral Conversation/Support)  Stress Factors  Patient Stress Factors Major life changes;Health changes   I followed up with the patient per request the previous day by the patient. The patient was about to take a nap, so our visit was brief. She requested continued prayer. I brought her a neck pillow and prayer shawl.   Please, contact Spiritual Care for further assistance.   [place] M.Div.

## 2016-10-30 NOTE — Consult Note (Signed)
[facility] Nurse wound consult note Reason for Consult:Fungating breast tumor. See yesterday by the [facility] Nurse extender, certified Almon Hercules. Patient is to see oncology next week.  Surgery has seen and there is currently no role for surgery.  See note by Dr. Excell Seltzer. Wound type: Neoplastic We will continue the sodium hypochlorite twice daily. Suggest this be continued post discharge for odor control and for palliative care.  If you agree, please provide prescriptions at discharge. [place] nursing team will not follow, but will remain available to this patient, the nursing and medical teams.  Please re-consult if needed. Thanks, Maudie Flakes, MSN, RN, [place], Arther Abbott  Pager# [phone]

## 2016-10-30 NOTE — Progress Notes (Signed)
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  - Adm with cellulitis - Unless she shows up for appts for port, we cannot treat her. - she needs chemotherapy as an out patient Will follow her as an out patient

## 2016-10-30 NOTE — Consult Note (Signed)
[facility] Consult Note  Ann Oliver 08/09/1955  [id number].    Requesting MD: Dr. Mart Piggs Chief Complaint/Reason for Consult: Breast mass  HPI:  Ann Oliver is a 62 y.o. female with a known fungating breast mass, previously diagnosed with breast cancer in May 2017. She was evaluated by a surgery in our office who offered her surgery but she declined. She was scheduled to see oncology for chemotherapy but was lost to follow up. 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 She was recently admitted to the hospital in May 2018 with infection of the breast mass and was treated with abx and wet-to-dry dressing changes. She presented to the hospital 10/28/16 with increased drainage and odor from left breast wound. Wound cultures taken and gram stain growing gram positive and negative bacterium. Blood cultures negative. General surgery has been asked to consult.  ROS: Review of Systems  Constitutional: Negative for chills and fever.  All other systems reviewed and are negative.   Family History  Problem Relation Age of Onset  . Breast cancer Neg Hx     Past Medical History:  Diagnosis Date  . Cancer ([place])   . Sinusitis   . Tumor cells     Past Surgical History:  Procedure Laterality Date  . BIOPSY BREAST      Social History:  reports that she has never smoked. She has never used smokeless tobacco. She reports that she does not drink alcohol or use drugs.  Allergies:  Allergies  Allergen Reactions  . Aspirin Palpitations    Medications Prior to Admission  Medication Sig Dispense Refill  . acetaminophen (TYLENOL) 500 MG tablet Take 2 tablets (1,000 mg total) by mouth every 6 (six) hours as needed. (Patient taking differently: Take 1,000 mg by mouth every 6 (six) hours as needed for moderate pain. ) 30 tablet 0  . feeding supplement, ENSURE ENLIVE, (ENSURE ENLIVE) LIQD Take 237 mLs by mouth 3 (three) times daily between meals. 30 Bottle  0  . ibuprofen (ADVIL,MOTRIN) 200 MG tablet Take 400 mg by mouth every 4 (four) hours as needed for mild pain or moderate pain.    . traMADol (ULTRAM) 50 MG tablet Take 1 tablet (50 mg total) by mouth every 6 (six) hours as needed. 90 tablet 0  . lidocaine-prilocaine (EMLA) cream Apply to affected area once (Patient not taking: Reported on 10/23/2016) 30 g 3  . ondansetron (ZOFRAN) 8 MG tablet Take 1 tablet (8 mg total) by mouth 2 (two) times daily as needed (Nausea or vomiting). (Patient not taking: Reported on 10/14/2016) 30 tablet 1  . prochlorperazine (COMPAZINE) 10 MG tablet Take 1 tablet (10 mg total) by mouth every 6 (six) hours as needed (Nausea or vomiting). (Patient not taking: Reported on 10/14/2016) 30 tablet 1    Blood pressure (!) 110/98, pulse 70, temperature 98.1 F (36.7 C), temperature source Oral, resp. rate 16, height _0  (1.549 m), weight 51.5 kg (113 lb 8.6 oz), SpO2 100 %. Physical Exam: Physical Exam  Constitutional: She is oriented to person, place, and time. She appears well-developed. No distress.  HENT:  Head: Normocephalic and atraumatic.  Right Ear: External ear normal.  Left Ear: External ear normal.  Eyes: EOM are normal. No scleral icterus.  Neck: Normal range of motion. Neck supple. No tracheal deviation present.  Cardiovascular: Normal rate, regular rhythm and normal heart sounds.   Pulmonary/Chest: Effort normal and breath sounds normal. No stridor. No respiratory distress. She  has no wheezes. She has no rales. She exhibits tenderness.  Left chest wall/axillary tenderness 2/2 firm, necrotic axillary and upper/outer quadrant breast mass. Mild sanguinous oozing from proximal aspect of mass, controlled with pressure. Mild surrounding cellulitis.  Abdominal: Soft. She exhibits no distension. There is no tenderness. There is no guarding.  Musculoskeletal: Normal range of motion. She exhibits no deformity.  Muscle wasting of all 4 extremities   Neurological: She  is alert and oriented to person, place, and time. No sensory deficit.  Skin: Skin is warm and dry. No rash noted. She is not diaphoretic.  Psychiatric: Her behavior is normal. Thought content normal.    Results for orders placed or performed during the hospital encounter of 10/28/16 (from the past 48 hour(s))  Comprehensive metabolic panel     Status: Abnormal   Collection Time: 10/28/16  4:44 PM  Result Value Ref Range   Sodium 140 135 - 145 mmol/L   Potassium 3.8 3.5 - 5.1 mmol/L   Chloride 103 101 - 111 mmol/L   CO2 28 22 - 32 mmol/L   Glucose, Bld 122 (H) 65 - 99 mg/dL   BUN 15 6 - 20 mg/dL   Creatinine, Ser 0.65 0.44 - 1.00 mg/dL   Calcium 9.7 8.9 - 10.3 mg/dL   Total Protein 8.2 (H) 6.5 - 8.1 g/dL   Albumin 4.3 3.5 - 5.0 g/dL   AST 30 15 - 41 U/L   ALT 18 14 - 54 U/L   Alkaline Phosphatase 90 38 - 126 U/L   Total Bilirubin 0.3 0.3 - 1.2 mg/dL   GFR calc non Af Amer >60 >60 mL/min   GFR calc Af Amer >60 >60 mL/min    Comment: (NOTE) The eGFR has been calculated using the CKD EPI equation. This calculation has not been validated in all clinical situations. eGFR's persistently <60 mL/min signify possible Chronic Kidney Disease.    Anion gap 9 5 - 15  CBC with Differential     Status: Abnormal   Collection Time: 10/28/16  4:44 PM  Result Value Ref Range   WBC 10.8 (H) 4.0 - 10.5 K/uL   RBC 4.36 3.87 - 5.11 MIL/uL   Hemoglobin 11.7 (L) 12.0 - 15.0 g/dL   HCT 35.9 (L) 36.0 - 46.0 %   MCV 82.3 78.0 - 100.0 fL   MCH 26.8 26.0 - 34.0 pg   MCHC 32.6 30.0 - 36.0 g/dL   RDW 13.3 11.5 - 15.5 %   Platelets 495 (H) 150 - 400 K/uL   Neutrophils Relative % 80 %   Neutro Abs 8.6 (H) 1.7 - 7.7 K/uL   Lymphocytes Relative 10 %   Lymphs Abs 1.1 0.7 - 4.0 K/uL   Monocytes Relative 9 %   Monocytes Absolute 1.0 0.1 - 1.0 K/uL   Eosinophils Relative 1 %   Eosinophils Absolute 0.1 0.0 - 0.7 K/uL   Basophils Relative 0 %   Basophils Absolute 0.0 0.0 - 0.1 K/uL  Lipase, blood      Status: None   Collection Time: 10/28/16  4:44 PM  Result Value Ref Range   Lipase 29 11 - 51 U/L  Wound or Superficial Culture     Status: None (Preliminary result)   Collection Time: 10/28/16  4:50 PM  Result Value Ref Range   Specimen Description BREAST LEFT UPPER OUTER    Special Requests NONE    Gram Stain      RARE WBC PRESENT, PREDOMINANTLY PMN ABUNDANT GRAM POSITIVE COCCI  IN PAIRS IN CLUSTERS ABUNDANT GRAM NEGATIVE RODS FEW GRAM POSITIVE RODS Performed at [facility], [place] [address] [place], [place] [address]    Culture PENDING    Report Status PENDING   Protime-INR     Status: None   Collection Time: 10/28/16  5:20 PM  Result Value Ref Range   Prothrombin Time 12.5 11.4 - 15.2 seconds   INR 0.94   APTT     Status: None   Collection Time: 10/28/16  5:20 PM  Result Value Ref Range   aPTT 26 24 - 36 seconds  Culture, blood (routine x 2)     Status: None (Preliminary result)   Collection Time: 10/28/16  5:29 PM  Result Value Ref Range   Specimen Description BLOOD RIGHT ANTECUBITAL    Special Requests      BOTTLES DRAWN AEROBIC AND ANAEROBIC Blood Culture adequate volume   Culture      NO GROWTH 2 DAYS Performed at [facility], [place] [address], [place], [place] [address]    Report Status PENDING   Culture, blood (routine x 2)     Status: None (Preliminary result)   Collection Time: 10/28/16 10:06 PM  Result Value Ref Range   Specimen Description BLOOD RIGHT ANTECUBITAL    Special Requests IN PEDIATRIC BOTTLE Blood Culture adequate volume    Culture      NO GROWTH 1 DAY Performed at [facility], [place] [address] [place], [place] [address]    Report Status PENDING   Basic metabolic panel     Status: Abnormal   Collection Time: 10/29/16  4:12 AM  Result Value Ref Range   Sodium 139 135 - 145 mmol/L   Potassium 3.5 3.5 - 5.1 mmol/L   Chloride 107 101 - 111 mmol/L   CO2 27 22 - 32 mmol/L   Glucose, Bld 103 (H) 65 - 99 mg/dL   BUN 11 6 - 20  mg/dL   Creatinine, Ser 0.71 0.44 - 1.00 mg/dL   Calcium 8.6 (L) 8.9 - 10.3 mg/dL   GFR calc non Af Amer >60 >60 mL/min   GFR calc Af Amer >60 >60 mL/min    Comment: (NOTE) The eGFR has been calculated using the CKD EPI equation. This calculation has not been validated in all clinical situations. eGFR's persistently <60 mL/min signify possible Chronic Kidney Disease.    Anion gap 5 5 - 15  CBC     Status: Abnormal   Collection Time: 10/29/16  4:12 AM  Result Value Ref Range   WBC 9.0 4.0 - 10.5 K/uL   RBC 3.61 (L) 3.87 - 5.11 MIL/uL   Hemoglobin 9.4 (L) 12.0 - 15.0 g/dL   HCT 29.8 (L) 36.0 - 46.0 %   MCV 82.5 78.0 - 100.0 fL   MCH 26.0 26.0 - 34.0 pg   MCHC 31.5 30.0 - 36.0 g/dL   RDW 13.3 11.5 - 15.5 %   Platelets 390 150 - 400 K/uL  CBC     Status: Abnormal   Collection Time: 10/30/16  4:12 AM  Result Value Ref Range   WBC 9.2 4.0 - 10.5 K/uL   RBC 3.63 (L) 3.87 - 5.11 MIL/uL   Hemoglobin 9.6 (L) 12.0 - 15.0 g/dL   HCT 30.3 (L) 36.0 - 46.0 %   MCV 83.5 78.0 - 100.0 fL   MCH 26.4 26.0 - 34.0 pg   MCHC 31.7 30.0 - 36.0 g/dL   RDW 13.4 11.5 -  15.5 %   Platelets 378 150 - 400 K/uL  Basic metabolic panel     Status: Abnormal   Collection Time: 10/30/16  4:12 AM  Result Value Ref Range   Sodium 139 135 - 145 mmol/L   Potassium 4.4 3.5 - 5.1 mmol/L    Comment: RESULT REPEATED AND VERIFIED DELTA CHECK NOTED    Chloride 103 101 - 111 mmol/L   CO2 27 22 - 32 mmol/L   Glucose, Bld 102 (H) 65 - 99 mg/dL   BUN 14 6 - 20 mg/dL   Creatinine, Ser 0.95 0.44 - 1.00 mg/dL   Calcium 9.1 8.9 - 10.3 mg/dL   GFR calc non Af Amer >60 >60 mL/min   GFR calc Af Amer >60 >60 mL/min    Comment: (NOTE) The eGFR has been calculated using the CKD EPI equation. This calculation has not been validated in all clinical situations. eGFR's persistently <60 mL/min signify possible Chronic Kidney Disease.    Anion gap 9 5 - 15   Ct Head Wo Contrast  Result Date: 10/28/2016 CLINICAL DATA:  Left  breast mass, diagnosed with breast cancer question metastatic disease EXAM: CT HEAD WITHOUT CONTRAST TECHNIQUE: Contiguous axial images were obtained from the base of the skull through the vertex without intravenous contrast. COMPARISON:  04/07/2007 FINDINGS: Brain: No acute territorial infarction, intracranial hemorrhage, or extra-axial fluid collection. No focal mass, mass effect or midline shift. The ventricles are nonenlarged. Vascular: No hyperdense vessels.  No unexpected calcifications. Skull: Normal. Negative for fracture or focal lesion. Sinuses/Orbits: No acute finding. Other: None IMPRESSION: No CT evidence for acute intracranial abnormality. Electronically Signed   By: Donavan Foil M.D.   On: 10/28/2016 19:53   Ct Chest W Contrast  Result Date: 10/28/2016 CLINICAL DATA:  L breast mass. Pt states she had mass for one year and mass became infected in May, diagnosed with breast cancer. Pt seen in May and treated with antibiotics. Pt applies dressing herself, wound with green bloody drainage, foul odor. Pt states she completed antibiotics in May, reports mild pain. Pt due to start treatment for cancer next week. EXAM: CT CHEST, ABDOMEN, AND PELVIS WITH CONTRAST TECHNIQUE: Multidetector CT imaging of the chest, abdomen and pelvis was performed following the standard protocol during bolus administration of intravenous contrast. CONTRAST:  165m ISOVUE-300 IOPAMIDOL (ISOVUE-300) INJECTION 61% COMPARISON:  09/25/2016 and previous studies FINDINGS: CT CHEST FINDINGS Cardiovascular: Heart size normal. No aortic aneurysm. No pericardial effusion. Mediastinum/Nodes: 18 mm low-attenuation right thyroid lesion, stable. No mediastinal adenopathy. Left axillary and subpectoral adenopathy as before. Lungs/Pleura: No pleural effusion. No pneumothorax. Multiple pulmonary nodules in all lobes, largest 19 mm in the inferior lingula image 85/6, previously 16 mm. Musculoskeletal: Irregular partially exophytic left  breast/chest wall mass measuring at least 11.8 cm, with adjacent skin thickening and hypervascularity. Stable mild superior endplate compression deformity of T10. No acute fracture. No worrisome bone lesion. CT ABDOMEN PELVIS FINDINGS Hepatobiliary: 18 mm lesion in segment 5 showing peripheral puddling of on the initial phase, filling in on delayed study, suggesting hemangioma, partially seen on previous study 05/06/2016. There is a poorly marginated 16 mm low-attenuation lesion in segment 2 image 43/2, which was not present on the prior study. Gallbladder is nondilated. No biliary ductal dilatation. Pancreas: Prominence of the pancreatic duct. Spleen: Normal in size without focal abnormality. Adrenals/Urinary Tract: Probable exophytic cyst from the mid right kidney measuring 2.2 cm. No hydronephrosis or solid renal mass. Normal adrenals. Urinary bladder is nondistended. Stomach/Bowel: Limited  evaluation without oral contrast. The stomach, small bowel, and colon are nondilated. Moderate colonic fecal material. The appendix is not discretely identified. Vascular/Lymphatic: Circumaortic left renal vein, an anatomic variant. No significant vascular findings are present.There is a 2.4 cm mass with central low-attenuation mass which abuts hepatic segment 3 and the pancreatic neck ; this area was not evaluated on prior chest scans. No other suggestion of abdominal or pelvic adenopathy. Reproductive: Uterus and bilateral adnexa are unremarkable. Other: No ascites.  No free air. Musculoskeletal: Negative for fracture or worrisome osseous lesion. IMPRESSION: 1. No acute findings. 2. Left breast/chest wall mass with slight progression of pulmonary metastatic disease. 3. Stable left axillary and subpectoral adenopathy. 4. 2.4 cm mass near the porta hepatis between the left lateral hepatic lobe and pancreatic neck possibly metastatic adenopathy. 5. New 16 mm hepatic segment 2 lesion suspicious for hepatic metastatic disease.  Electronically Signed   By: Lucrezia Europe M.D.   On: 10/28/2016 20:06   Ct Abdomen Pelvis W Contrast  Result Date: 10/28/2016 CLINICAL DATA:  L breast mass. Pt states she had mass for one year and mass became infected in May, diagnosed with breast cancer. Pt seen in May and treated with antibiotics. Pt applies dressing herself, wound with green bloody drainage, foul odor. Pt states she completed antibiotics in May, reports mild pain. Pt due to start treatment for cancer next week. EXAM: CT CHEST, ABDOMEN, AND PELVIS WITH CONTRAST TECHNIQUE: Multidetector CT imaging of the chest, abdomen and pelvis was performed following the standard protocol during bolus administration of intravenous contrast. CONTRAST:  113m ISOVUE-300 IOPAMIDOL (ISOVUE-300) INJECTION 61% COMPARISON:  09/25/2016 and previous studies FINDINGS: CT CHEST FINDINGS Cardiovascular: Heart size normal. No aortic aneurysm. No pericardial effusion. Mediastinum/Nodes: 18 mm low-attenuation right thyroid lesion, stable. No mediastinal adenopathy. Left axillary and subpectoral adenopathy as before. Lungs/Pleura: No pleural effusion. No pneumothorax. Multiple pulmonary nodules in all lobes, largest 19 mm in the inferior lingula image 85/6, previously 16 mm. Musculoskeletal: Irregular partially exophytic left breast/chest wall mass measuring at least 11.8 cm, with adjacent skin thickening and hypervascularity. Stable mild superior endplate compression deformity of T10. No acute fracture. No worrisome bone lesion. CT ABDOMEN PELVIS FINDINGS Hepatobiliary: 18 mm lesion in segment 5 showing peripheral puddling of on the initial phase, filling in on delayed study, suggesting hemangioma, partially seen on previous study 05/06/2016. There is a poorly marginated 16 mm low-attenuation lesion in segment 2 image 43/2, which was not present on the prior study. Gallbladder is nondilated. No biliary ductal dilatation. Pancreas: Prominence of the pancreatic duct. Spleen:  Normal in size without focal abnormality. Adrenals/Urinary Tract: Probable exophytic cyst from the mid right kidney measuring 2.2 cm. No hydronephrosis or solid renal mass. Normal adrenals. Urinary bladder is nondistended. Stomach/Bowel: Limited evaluation without oral contrast. The stomach, small bowel, and colon are nondilated. Moderate colonic fecal material. The appendix is not discretely identified. Vascular/Lymphatic: Circumaortic left renal vein, an anatomic variant. No significant vascular findings are present.There is a 2.4 cm mass with central low-attenuation mass which abuts hepatic segment 3 and the pancreatic neck ; this area was not evaluated on prior chest scans. No other suggestion of abdominal or pelvic adenopathy. Reproductive: Uterus and bilateral adnexa are unremarkable. Other: No ascites.  No free air. Musculoskeletal: Negative for fracture or worrisome osseous lesion. IMPRESSION: 1. No acute findings. 2. Left breast/chest wall mass with slight progression of pulmonary metastatic disease. 3. Stable left axillary and subpectoral adenopathy. 4. 2.4 cm mass near the porta  hepatis between the left lateral hepatic lobe and pancreatic neck possibly metastatic adenopathy. 5. New 16 mm hepatic segment 2 lesion suspicious for hepatic metastatic disease. Electronically Signed   By: Lucrezia Europe M.D.   On: 10/28/2016 20:06   Assessment/Plan Necrotic left breast/axillary mass in the setting of metastatic breast cancer.  - agree with antibiotic treatment based on cultures - recommend continuation of daily (and as needed 2/2 drainage) dressing changes with a non-stick telfa dressing, gauze, and tape OR wet-to-dry dressing. Appreciate further recommendations by WOC. - no role for surgical intervention in this setting. Recommend medical management according to patient wishes and oncology recommendations.   General surgery will sign off. Call with questions or concerns.   Jill Alexanders,  [facility] Surgery 10/30/2016, 11:47 AM Pager: [phone] Consults: [phone] Mon-Fri 7:00 am-4:30 pm Sat-Sun 7:00 am-11:30 am

## 2016-10-31 LAB — CBC
HCT: 32.3 % — ABNORMAL LOW (ref 36.0–46.0)
HEMOGLOBIN: 10.2 g/dL — AB (ref 12.0–15.0)
MCH: 26.4 pg (ref 26.0–34.0)
MCHC: 31.6 g/dL (ref 30.0–36.0)
MCV: 83.5 fL (ref 78.0–100.0)
Platelets: 454 10*3/uL — ABNORMAL HIGH (ref 150–400)
RBC: 3.87 MIL/uL (ref 3.87–5.11)
RDW: 13.6 % (ref 11.5–15.5)
WBC: 9.2 10*3/uL (ref 4.0–10.5)

## 2016-10-31 LAB — BASIC METABOLIC PANEL
Anion gap: 9 (ref 5–15)
BUN: 12 mg/dL (ref 6–20)
CHLORIDE: 102 mmol/L (ref 101–111)
CO2: 30 mmol/L (ref 22–32)
Calcium: 9.4 mg/dL (ref 8.9–10.3)
Creatinine, Ser: 0.87 mg/dL (ref 0.44–1.00)
GFR calc Af Amer: >60 mL/min (ref >60)
GFR calc non Af Amer: >60 mL/min (ref >60)
GLUCOSE: 103 mg/dL — AB (ref 65–99)
Potassium: 4.3 mmol/L (ref 3.5–5.1)
Sodium: 141 mmol/L (ref 135–145)

## 2016-10-31 LAB — FOLATE: Folate: 25.7 ng/mL (ref >5.9)

## 2016-10-31 LAB — RETICULOCYTES
RBC.: 3.87 MIL/uL (ref 3.87–5.11)
RETIC CT PCT: 0.8 % (ref 0.4–3.1)
Retic Count, Absolute: 31 10*3/uL (ref 19.0–186.0)

## 2016-10-31 LAB — AEROBIC CULTURE  (SUPERFICIAL SPECIMEN)

## 2016-10-31 LAB — AEROBIC CULTURE W GRAM STAIN (SUPERFICIAL SPECIMEN)

## 2016-10-31 LAB — IRON AND TIBC
Iron: 34 ug/dL (ref 28–170)
SATURATION RATIOS: 12 % (ref 10.4–31.8)
TIBC: 279 ug/dL (ref 250–450)
UIBC: 245 ug/dL

## 2016-10-31 LAB — VITAMIN B12: Vitamin B-12: 3032 pg/mL — ABNORMAL HIGH (ref 180–914)

## 2016-10-31 LAB — FERRITIN: FERRITIN: 167 ng/mL (ref 11–307)

## 2016-10-31 LAB — VANCOMYCIN, TROUGH: Vancomycin Tr: 9 ug/mL — ABNORMAL LOW (ref 15–20)

## 2016-10-31 MED ORDER — VANCOMYCIN HCL IN DEXTROSE 750-5 MG/150ML-% IV SOLN
750.0000 mg | Freq: Two times a day (BID) | INTRAVENOUS | Status: DC
  Administered 2016-11-01 – 2016-11-02 (×3): 750 mg via INTRAVENOUS
  Filled 2016-10-31 (×3): qty 150

## 2016-10-31 NOTE — Progress Notes (Signed)
Pharmacy Antibiotic Note  Ann Oliver is a 61 y.o. female with breast cancer and has not received any treatment d/t lost to follow-up.  Patient's currently on vancomycin and zosyn for cellulitis/wound infection.  Per [facility], no role for surgical intervention at this time.  Today, 10/31/2016: -  Afeb, WBC wnl -  scr stable (crcl~50)   Plan: - continue zosyn 3.375 gm IV q8h (infuse over 4 hrs) - continue vancomycin 500 mg IV q12h.  Will check vancomycin trough level prior to this evening's dose to assess current regimen and drug clearance  ___________________________________  Height: 5\' 1"  (154.9 cm) Weight: 113 lb 8.6 oz (51.5 kg) IBW/kg (Calculated) : 47.8  Temp (24hrs), Avg:98.2 F (36.8 C), Min:97.2 F (36.2 C), Max:99.2 F (37.3 C)   Recent Labs Lab 10/28/16 1644 10/29/16 0412 10/30/16 0412 10/31/16 0344  WBC 10.8* 9.0 9.2 9.2  CREATININE 0.65 0.71 0.95 0.87    Estimated Creatinine Clearance: 50.6 mL/min (by C-G formula based on SCr of 0.87 mg/dL).    Allergies  Allergen Reactions  . Aspirin Palpitations    Antimicrobials this admission:  6/6 Vancomycin  >> 6/6 Zosyn  >>   Dose adjustments this admission:  --  Microbiology results:  6/6 BCx x2:   6/6 Wound Cx: GS - GPCs in pairs/clusters, GNRs, GPRs Thank you for allowing pharmacy to be a part of this patient's care.  Lynelle Doctor 10/31/2016 10:23 AM

## 2016-10-31 NOTE — Progress Notes (Signed)
Patient ID: Ann Oliver, female   DOB: 05/07/1956, 61 y.o.   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 Patient was initially set up with oncologist but has failed to maintain appropriate follow-up appointments. She was recently admitted from 09/25/2016 until 09/27/2016 for fungating, necrotic infected breast mass extending to axilla, was treated with vancomycin and Zosyn and was transitioned to oral Bactrim with plan to follow up with oncologist. Surgery has also seen patient in evaluation on that admission and has recommended twice-daily wet-to-dry changes. Since the recent discharge, patient says she has not had a chance to follow up with her oncologist and the wound was getting worse, more discharge and foul odor was present. Please note that patient has not shared this with family and prefers to keep this private.   Assessment & Plan:   Principal Problem:   Cellulitis of left breast, fungating necrotic infected breast mass, metastatic breast cancer - Has been on vancomycin and Zosyn, today is day 3 of antibiotics - breast mass now necrotic, fungating ulcer, due to breast cancer neglect unfortunately  - at this time per surgery team, no surgery indicated, palliative chemo could be done but pt must establish outpatient follow up which so far pt has not done - I am not sure that there is much role for ABX either as it would not provide cure - will consult with palliative care team for further assistance   Active Problems:   Severe protein calorie malnutrition - Provide in sure 3 times a day, Magic cup 3 times a day, multivitamin daily - continue same regimen - pt so far tolerating diet well  - Appreciate nutritionist team  assistance    Anemia of likely chronic disease malignancy - Hg overall stable, no evidence of active bleeding - no need for further trend unless pt symptomatic     Thrombocytosis - reactive     Leukocytosis - resolved   DVT prophylaxis: SCDs Code Status: Full Family Communication: Patient at bedside  Disposition Plan: To be determined, need palliative care consultation   Consultants:   Oncology  Surgery  Wound Care team  PCT  Procedures:   None  Antimicrobials:   Vancomycin 10/29/2016 -->  Zosyn 10/29/2016 -->  Subjective: Pt denies any pain this AM, tolerating diet well.   Objective: Vitals:   10/30/16 0610 10/30/16 1430 10/30/16 2039 10/31/16 0624  BP: (!) 110/98 120/69 103/61 119/67  Pulse: 70 89 83 75  Resp: 16 18 17 17   Temp: 98.1 F (36.7 C) 98.2 F (36.8 C) 99.2 F (37.3 C) 97.2 F (36.2 C)  TempSrc: Oral Oral Oral Oral  SpO2: 100% 100% 100% 100%  Weight:      Height:        Intake/Output Summary (Last 24 hours) at 10/31/16 1234 Last data filed at 10/31/16 1040  Gross per 24 hour  Intake              870 ml  Output             1300 ml  Net             -430 ml   Filed Weights   10/28/16 2132  Weight: 51.5 kg (113 lb 8.6 oz)  Physical Exam  Constitutional: Appears well-developed and well-nourished. No distress.  CVS: RRR, S1/S2 +, no murmurs, no gallops, no carotid bruit.  Pulmonary: Effort and breath sounds normal, no stridor, rhonchi, wheezes, rales.  Abdominal: Soft. BS +,  no distension, tenderness, rebound or guarding.   Data Reviewed: I have personally reviewed following labs and imaging studies  CBC:  Recent Labs Lab 10/28/16 1644 10/29/16 0412 10/30/16 0412 10/31/16 0344  WBC 10.8* 9.0 9.2 9.2  NEUTROABS 8.6*  --   --   --   HGB 11.7* 9.4* 9.6* 10.2*  HCT 35.9* 29.8* 30.3* 32.3*  MCV 82.3 82.5 83.5 83.5  PLT 495* 390 378 062*   Basic Metabolic Panel:  Recent Labs Lab 10/28/16 1644 10/29/16 0412  10/30/16 0412 10/31/16 0344  NA 140 139 139 141  K 3.8 3.5 4.4 4.3  CL 103 107 103 102  CO2 28 27 27 30   GLUCOSE 122* 103* 102* 103*  BUN 15 11 14 12   CREATININE 0.65 0.71 0.95 0.87  CALCIUM 9.7 8.6* 9.1 9.4   Liver Function Tests:  Recent Labs Lab 10/28/16 1644  AST 30  ALT 18  ALKPHOS 90  BILITOT 0.3  PROT 8.2*  ALBUMIN 4.3    Recent Labs Lab 10/28/16 1644  LIPASE 29   Coagulation Profile:  Recent Labs Lab 10/28/16 1720  INR 0.94   Urine analysis:    Component Value Date/Time   COLORURINE YELLOW 09/25/2016 0230   APPEARANCEUR CLEAR 09/25/2016 0230   LABSPEC 1.019 09/25/2016 0230   PHURINE 5.0 09/25/2016 0230   GLUCOSEU NEGATIVE 09/25/2016 0230   HGBUR NEGATIVE 09/25/2016 0230   BILIRUBINUR NEGATIVE 09/25/2016 0230   KETONESUR 5 (A) 09/25/2016 0230   PROTEINUR NEGATIVE 09/25/2016 0230   NITRITE NEGATIVE 09/25/2016 0230   LEUKOCYTESUR NEGATIVE 09/25/2016 0230   Recent Results (from the past 240 hour(s))  Wound or Superficial Culture     Status: None (Preliminary result)   Collection Time: 10/28/16  4:50 PM  Result Value Ref Range Status   Specimen Description BREAST LEFT UPPER OUTER  Final   Special Requests NONE  Final   Gram Stain   Final    RARE WBC PRESENT, PREDOMINANTLY PMN ABUNDANT GRAM POSITIVE COCCI IN PAIRS IN CLUSTERS ABUNDANT GRAM NEGATIVE RODS FEW GRAM POSITIVE RODS    Culture   Final    CULTURE REINCUBATED FOR BETTER GROWTH Performed at [facility], [address] [place], [place] [address]    Report Status PENDING  Incomplete  Culture, blood (routine x 2)     Status: None (Preliminary result)   Collection Time: 10/28/16  5:29 PM  Result Value Ref Range Status   Specimen Description BLOOD RIGHT ANTECUBITAL  Final   Special Requests   Final    BOTTLES DRAWN AEROBIC AND ANAEROBIC Blood Culture adequate volume   Culture   Final    NO GROWTH 2 DAYS Performed at [facility], [place] [address], [place], [place]  [address]    Report Status PENDING  Incomplete  Culture, blood (routine x 2)     Status: None (Preliminary result)   Collection Time: 10/28/16 10:06 PM  Result Value Ref Range Status   Specimen Description BLOOD RIGHT ANTECUBITAL  Final   Special Requests IN PEDIATRIC BOTTLE Blood Culture adequate volume  Final   Culture   Final    NO GROWTH 1 DAY Performed at  Hospital Lab, [place] [address] [place], [place] [address]  Report Status PENDING  Incomplete     Radiology Studies: Ct Head Wo Contrast Result Date: 10/28/2016 No CT evidence for acute intracranial abnormality.  Ct Chest W Contrast Ct Abdomen Pelvis W Contrast Result Date: 10/28/2016 1. No acute findings.  2. Left breast/chest wall mass with slight progression of pulmonary metastatic disease.  3. Stable left axillary and subpectoral adenopathy.  4. 2.4 cm mass near the porta hepatis between the left lateral hepatic lobe and pancreatic neck possibly metastatic adenopathy.  5. New 16 mm hepatic segment 2 lesion suspicious for hepatic metastatic disease.  Scheduled Meds: . feeding supplement (ENSURE ENLIVE)  237 mL Oral TID BM  . multivitamin with minerals  1 tablet Oral Daily  . sodium hypochlorite   Irrigation BID   Continuous Infusions: . piperacillin-tazobactam (ZOSYN)  IV 3.375 g (10/31/16 0656)  . vancomycin 500 mg (10/31/16 0528)     LOS: 2 days   Time spent: 25 minutes   Faye Ramsay, MD [facility] Pager [phone]  If 7PM-7AM, please contact night-coverage [email or web address] Password [facility] 10/31/2016, 12:34 PM

## 2016-10-31 NOTE — Progress Notes (Signed)
Brief pharmacy antibiotic note: For full details see note from [facility] and Assessment: vanc trough=9 subtherapeutic on 500mg  IV q12h Increase vanc to 750mg  IV q12h Continue to follow renal function and vanc trough at steady state  Dolly Rias RPh 10/31/2016, 7:33 PM Pager [phone]

## 2016-11-01 DIAGNOSIS — Z515 Encounter for palliative care: Secondary | ICD-10-CM

## 2016-11-01 DIAGNOSIS — Z7189 Other specified counseling: Secondary | ICD-10-CM

## 2016-11-01 MED ORDER — DAKINS (1/4 STRENGTH) 0.125 % EX SOLN
Freq: Two times a day (BID) | CUTANEOUS | Status: DC
  Administered 2016-11-02: 05:00:00
  Administered 2016-11-02: 1
  Filled 2016-11-01: qty 473

## 2016-11-01 NOTE — Consult Note (Signed)
Consultation Note Date: 11/01/2016   Patient Name: Ann Oliver  DOB: [birth year]  MRN: [id number]  Age / Sex: 61 y.o., female  PCP: Care, Audley Hose II Referring Physician: Theodis Blaze, MD  Reason for Consultation: Establishing goals of care  HPI/Patient Profile: 61 y.o. female  with past medical history of fungating breast cancer admitted on 10/28/2016 with cellulitis of breast lesion.  Palliative consulted for goals of care   Clinical Assessment and Goals of Care: I met today with Ms. Belenda Cruise. We discussed clinical course as well as wishes moving forward in regard to advanced directives.  Concepts specific to code status and rehospitalization discussed.  We discussed difference between a aggressive medical intervention path and a palliative, comfort focused care path.  Values and goals of care important to patient and family were attempted to be elicited.  Concept of Hospice and Palliative Care were discussed  Questions and concerns addressed.   PMT will continue to support holistically.  SUMMARY OF RECOMMENDATIONS   - She reports that she is planning to follow-up with oncology once she is discharged to begin chemotherapy.  She has stated this on multiple occasions and has not yet started treatment. - Her home situation is unclear to me.  Noted in chart to be homeless at shelter, in hotels, and staying with her son.  She reports that she is currently living with her son but declines to give address or his name. - She is not interested in hospice.  She states that she wants to seek treatment for her cancer.  We did discuss that it is not a curable disease and she states that she is relying on God for healing. - Attempted to discuss code status.  She would not engage in this conversation and said she "will think about it."  She remains a full code. - She declined to discuss completing paperwork to  name surrogate decision maker.  I informed her that it will be her children (states she has 5 boys) who will collectively make decisions on her behalf unless she names someone else. - She does not want her family to know about her illness - She declined to have me come back to discuss further tomorrow.  She has my card and will call if she would like to discuss further. - I am not sure how much acceptance Ms. Lennon has in regard to her illness.  She states that she is planning to follow up with oncology, but I am concerned that this will not actually be the case based on prior experiences.  Unfortunately, I am not sure how to be of further assistance in her care at this time.  Please call if there are specific areas in which we can be of assistance.  Otherwise, will follow-up if she calls and is willing to discuss further.  Code Status/Advance Care Planning:  Full code    Symptom Management:   Denies all symptoms.  Noted in chaplain notes that she feels that she is to "praise God through (pain)  and not let people know"  Palliative Prophylaxis:   Frequent Pain Assessment  Additional Recommendations (Limitations, Scope, Preferences):  Full Scope Treatment  Psycho-social/Spiritual:   Desire for further Chaplaincy support:Already involved  Additional Recommendations: Caregiving  Support/Resources  Prognosis:   < 6 months without treatment and she should qualify for hospice in the future if so desired.  She is not interested in hospice at this time and reports plan to f/u with oncology as OP.  Discharge Planning: To Be Determined but tells me she is not interested in SNF at this time      Primary Diagnoses: Present on Admission: . Cellulitis . Breast cancer metastasized to lung, right ([place]) . Malnutrition of moderate degree . Cellulitis of left breast   I have reviewed the medical record, interviewed the patient and family, and examined the patient. The following aspects are  pertinent.  Past Medical History:  Diagnosis Date  . Cancer ([place])   . Sinusitis   . Tumor cells    Social History   Social History  . Marital status: Single    Spouse name: N/A  . Number of children: N/A  . Years of education: N/A   Social History Main Topics  . Smoking status: Never Smoker  . Smokeless tobacco: Never Used  . Alcohol use No  . Drug use: No  . Sexual activity: Not Asked   Other Topics Concern  . None   Social History Narrative  . None   Family History  Problem Relation Age of Onset  . Breast cancer Neg Hx    Scheduled Meds: . feeding supplement (ENSURE ENLIVE)  237 mL Oral TID BM  . multivitamin with minerals  1 tablet Oral Daily  . [START ON 11/02/2016] sodium hypochlorite   Irrigation BID   Continuous Infusions: . piperacillin-tazobactam (ZOSYN)  IV 3.375 g (11/01/16 2129)  . vancomycin 750 mg (11/01/16 1725)   PRN Meds:.ondansetron **OR** ondansetron (ZOFRAN) IV, oxyCODONE-acetaminophen, traMADol Medications Prior to Admission:  Prior to Admission medications   Medication Sig Start Date End Date Taking? Authorizing Provider  acetaminophen (TYLENOL) 500 MG tablet Take 2 tablets (1,000 mg total) by mouth every 6 (six) hours as needed. Patient taking differently: Take 1,000 mg by mouth every 6 (six) hours as needed for moderate pain.  10/09/16  Yes Charlesetta Shanks, MD  feeding supplement, ENSURE ENLIVE, (ENSURE ENLIVE) LIQD Take 237 mLs by mouth 3 (three) times daily between meals. 09/26/16  Yes [name], MD  ibuprofen (ADVIL,MOTRIN) 200 MG tablet Take 400 mg by mouth every 4 (four) hours as needed for mild pain or moderate pain.   Yes [provider]  traMADol (ULTRAM) 50 MG tablet Take 1 tablet (50 mg total) by mouth every 6 (six) hours as needed. 10/01/16  Yes Nicholas Lose, MD  lidocaine-prilocaine (EMLA) cream Apply to affected area once Patient not taking: Reported on 10/23/2016 10/01/16   Nicholas Lose, MD  ondansetron (ZOFRAN) 8 MG  tablet Take 1 tablet (8 mg total) by mouth 2 (two) times daily as needed (Nausea or vomiting). Patient not taking: Reported on 10/14/2016 10/01/16   Nicholas Lose, MD  prochlorperazine (COMPAZINE) 10 MG tablet Take 1 tablet (10 mg total) by mouth every 6 (six) hours as needed (Nausea or vomiting). Patient not taking: Reported on 10/14/2016 10/01/16   Nicholas Lose, MD   Allergies  Allergen Reactions  . Aspirin Palpitations   Review of Systems Denies all complaints today  Physical Exam  General: Alert, awake, in no  acute distress.  HEENT: No bruits, no goiter, no JVD Heart: Regular rate and rhythm. No murmur appreciated. Lungs: Good air movement, clear Chest: Large mass noted L breast. Exam deferred per patient preference. Dressing intact. Abdomen: Soft, nontender, nondistended, positive bowel sounds.  Ext: No significant edema Skin: Warm and dry Neuro: Grossly intact, nonfocal.   Vital Signs: BP (!) 123/99 (BP Location: Right Arm)   Pulse 83   Temp 98.8 F (37.1 C) (Oral)   Resp 16   Ht 5' 1"  (1.549 m)   Wt 51.5 kg (113 lb 8.6 oz)   SpO2 100%   BMI 21.45 kg/m  Pain Assessment: 0-10 POSS *See Group Information*: 1-Acceptable,Awake and alert Pain Score: 2    SpO2: SpO2: 100 % O2 Device:SpO2: 100 % O2 Flow Rate: .   IO: Intake/output summary:  Intake/Output Summary (Last 24 hours) at 11/01/16 2246 Last data filed at 11/01/16 1830  Gross per 24 hour  Intake             1917 ml  Output             1110 ml  Net              807 ml    LBM: Last BM Date: 10/30/16 Baseline Weight: Weight: 51.5 kg (113 lb 8.6 oz) Most recent weight: Weight: 51.5 kg (113 lb 8.6 oz)     Palliative Assessment/Data:   Flowsheet Rows     Most Recent Value  Intake Tab  Referral Department  Hospitalist  Unit at Time of Referral  Oncology Unit  Palliative Care Primary Diagnosis  Cancer  Date Notified  10/31/16  Reason for referral  Counsel Regarding Hospice, Advance Care Planning, Clarify  Goals of Care  Date of Admission  10/28/16  Date first seen by Palliative Care  11/01/16  # of days Palliative referral response time  1 Day(s)  # of days IP prior to Palliative referral  3  Clinical Assessment  Palliative Performance Scale Score  50%  Pain Max last 24 hours  5  Pain Min Last 24 hours  0  Psychosocial & Spiritual Assessment  Palliative Care Outcomes  Patient/Family meeting held?  Yes  Who was at the meeting?  Patient  Palliative Care Outcomes  Clarified goals of care, Counseled regarding hospice      Time In: 1700 Time Out: 1800 Time Total: 60 Greater than 50%  of this time was spent counseling and coordinating care related to the above assessment and plan.  Signed by: Micheline Rough, MD   Please contact Palliative Medicine Team phone at [phone] for questions and concerns.  For individual provider: See Shea Evans

## 2016-11-01 NOTE — Progress Notes (Signed)
Patient ID: Ann Oliver, female   DOB: [birth year], 61 y.o.   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 Patient was initially set up with oncologist but has failed to maintain appropriate follow-up appointments. She was recently admitted from 09/25/2016 until 09/27/2016 for fungating, necrotic infected breast mass extending to axilla, was treated with vancomycin and Zosyn and was transitioned to oral Bactrim with plan to follow up with oncologist. Surgery has also seen patient in evaluation on that admission and has recommended twice-daily wet-to-dry changes. Since the recent discharge, patient says she has not had a chance to follow up with her oncologist and the wound was getting worse, more discharge and foul odor was present. Please note that patient has not shared this with family and prefers to keep this private.   Assessment & Plan:   Principal Problem:   Cellulitis of left breast, fungating necrotic infected breast mass, metastatic breast cancer - Has been on vancomycin and Zosyn, today is day 3 of antibiotics - breast mass now necrotic, fungating ulcer, due to breast cancer neglect unfortunately  - at this time per surgery team, no surgery indicated, palliative chemo could be done but pt must establish outpatient follow up which so far pt has not done - I am not sure that there is much role for ABX either as it would not provide cure - palliative care team consulted for assistance an [place] discussion   Active Problems:   Severe protein calorie malnutrition - Provide in sure 3 times a day, Magic cup 3 times a day, multivitamin daily - continue same regimen, pt tolerating well     Anemia of likely chronic disease  malignancy - no evidence of active bleeding     Thrombocytosis - reactive, no need for further monitoring     Leukocytosis - resolved   DVT prophylaxis: SCDs Code Status: Full Family Communication: pt at bedside, no family at bedside  Disposition Plan: awaiting palliative care team consultation   Consultants:   Oncology  Surgery  Wound Care team  PCT  Procedures:   None  Antimicrobials:   Vancomycin 10/29/2016 -->  Zosyn 10/29/2016 -->  Subjective: Pt denies pain on chest or abd, no dyspnea.   Objective: Vitals:   10/31/16 0624 10/31/16 1413 10/31/16 2130 11/01/16 0643  BP: 119/67 99/73 126/79 (!) 146/80  Pulse: 75 88 84 80  Resp: 17 18 16 18   Temp: 97.2 F (36.2 C) 98.2 F (36.8 C) 98.6 F (37 C) 98.2 F (36.8 C)  TempSrc: Oral Oral Oral Oral  SpO2: 100% 100% 100% 100%  Weight:      Height:        Intake/Output Summary (Last 24 hours) at 11/01/16 1137 Last data filed at 11/01/16 1000  Gross per 24 hour  Intake             1357 ml  Output             1500 ml  Net             -143 ml   Filed Weights   10/28/16 2132  Weight: 51.5 kg (113 lb 8.6 oz)    Physical Exam  Constitutional: Appears chronically ill, NAD CVS: RRR, S1/S2 +, no murmurs, no  gallops, no carotid bruit. Necrotic ulcerating mass left breast extending to axilla  Pulmonary: Effort and breath sounds normal, no stridor, rhonchi, wheezes, rales. Abdominal: Soft. BS +,  no distension, tenderness, rebound or guarding.  Neuro: Alert. Normal reflexes, muscle tone coordination. No cranial nerve deficit.  Data Reviewed: I have personally reviewed following labs and imaging studies  CBC:  Recent Labs Lab 10/28/16 1644 10/29/16 0412 10/30/16 0412 10/31/16 0344  WBC 10.8* 9.0 9.2 9.2  NEUTROABS 8.6*  --   --   --   HGB 11.7* 9.4* 9.6* 10.2*  HCT 35.9* 29.8* 30.3* 32.3*  MCV 82.3 82.5 83.5 83.5  PLT 495* 390 378 948*   Basic Metabolic Panel:  Recent Labs Lab 10/28/16 1644  10/29/16 0412 10/30/16 0412 10/31/16 0344  NA 140 139 139 141  K 3.8 3.5 4.4 4.3  CL 103 107 103 102  CO2 28 27 27 30   GLUCOSE 122* 103* 102* 103*  BUN 15 11 14 12   CREATININE 0.65 0.71 0.95 0.87  CALCIUM 9.7 8.6* 9.1 9.4   Liver Function Tests:  Recent Labs Lab 10/28/16 1644  AST 30  ALT 18  ALKPHOS 90  BILITOT 0.3  PROT 8.2*  ALBUMIN 4.3    Recent Labs Lab 10/28/16 1644  LIPASE 29   Coagulation Profile:  Recent Labs Lab 10/28/16 1720  INR 0.94   Urine analysis:    Component Value Date/Time   COLORURINE YELLOW 09/25/2016 0230   APPEARANCEUR CLEAR 09/25/2016 0230   LABSPEC 1.019 09/25/2016 0230   PHURINE 5.0 09/25/2016 0230   GLUCOSEU NEGATIVE 09/25/2016 0230   HGBUR NEGATIVE 09/25/2016 0230   BILIRUBINUR NEGATIVE 09/25/2016 0230   KETONESUR 5 (A) 09/25/2016 0230   PROTEINUR NEGATIVE 09/25/2016 0230   NITRITE NEGATIVE 09/25/2016 0230   LEUKOCYTESUR NEGATIVE 09/25/2016 0230   Recent Results (from the past 240 hour(s))  Wound or Superficial Culture     Status: Abnormal   Collection Time: 10/28/16  4:50 PM  Result Value Ref Range Status   Specimen Description BREAST LEFT UPPER OUTER  Final   Special Requests NONE  Final   Gram Stain   Final    RARE WBC PRESENT, PREDOMINANTLY PMN ABUNDANT GRAM POSITIVE COCCI IN PAIRS IN CLUSTERS ABUNDANT GRAM NEGATIVE RODS FEW GRAM POSITIVE RODS Performed at [facility], [address] [place], [place] [address]    Culture MULTIPLE ORGANISMS PRESENT, NONE PREDOMINANT (A)  Final   Report Status 10/31/2016 FINAL  Final  Culture, blood (routine x 2)     Status: None (Preliminary result)   Collection Time: 10/28/16  5:29 PM  Result Value Ref Range Status   Specimen Description BLOOD RIGHT ANTECUBITAL  Final   Special Requests   Final    BOTTLES DRAWN AEROBIC AND ANAEROBIC Blood Culture adequate volume   Culture   Final    NO GROWTH 3 DAYS Performed at [facility], [place] [address], [place], [place]  [address]    Report Status PENDING  Incomplete  Culture, blood (routine x 2)     Status: None (Preliminary result)   Collection Time: 10/28/16 10:06 PM  Result Value Ref Range Status   Specimen Description BLOOD RIGHT ANTECUBITAL  Final   Special Requests IN PEDIATRIC BOTTLE Blood Culture adequate volume  Final   Culture   Final    NO GROWTH 2 DAYS Performed at [facility], [place] [address] [place],  [address]    Report Status PENDING  Incomplete  Radiology Studies: Ct Head Wo Contrast Result Date: 10/28/2016 No CT evidence for acute intracranial abnormality.  Ct Chest W Contrast Ct Abdomen Pelvis W Contrast Result Date: 10/28/2016 1. No acute findings.  2. Left breast/chest wall mass with slight progression of pulmonary metastatic disease.  3. Stable left axillary and subpectoral adenopathy.  4. 2.4 cm mass near the porta hepatis between the left lateral hepatic lobe and pancreatic neck possibly metastatic adenopathy.  5. New 16 mm hepatic segment 2 lesion suspicious for hepatic metastatic disease.  Scheduled Meds: . feeding supplement (ENSURE ENLIVE)  237 mL Oral TID BM  . multivitamin with minerals  1 tablet Oral Daily   Continuous Infusions: . piperacillin-tazobactam (ZOSYN)  IV Stopped (11/01/16 8101)  . vancomycin Stopped (11/01/16 0730)    LOS: 3 days   Time spent: 25 minutes  Faye Ramsay, MD [facility] Pager [phone]  If 7PM-7AM, please contact night-coverage [email or web address] Password [facility] 11/01/2016, 11:37 AM

## 2016-11-02 LAB — CULTURE, BLOOD (ROUTINE X 2)
Culture: NO GROWTH
Special Requests: ADEQUATE

## 2016-11-02 MED ORDER — SULFAMETHOXAZOLE-TRIMETHOPRIM 800-160 MG PO TABS
1.0000 | ORAL_TABLET | Freq: Two times a day (BID) | ORAL | Status: DC
  Administered 2016-11-02: 1 via ORAL
  Filled 2016-11-02: qty 1

## 2016-11-02 MED ORDER — OXYCODONE-ACETAMINOPHEN 5-325 MG PO TABS: 1.0000 | ORAL_TABLET | ORAL | 0 refills | Status: DC | PRN

## 2016-11-02 MED ORDER — SULFAMETHOXAZOLE-TRIMETHOPRIM 800-160 MG PO TABS: 1.0000 | ORAL_TABLET | Freq: Two times a day (BID) | ORAL | 0 refills | Status: DC

## 2016-11-02 NOTE — Progress Notes (Signed)
Patient ID: Ann Oliver, female   DOB: March 18, 1956, 61 y.o.   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 Patient was initially set up with oncologist but has failed to maintain appropriate follow-up appointments. She was recently admitted from 09/25/2016 until 09/27/2016 for fungating, necrotic infected breast mass extending to axilla, was treated with vancomycin and Zosyn and was transitioned to oral Bactrim with plan to follow up with oncologist. Surgery has also seen patient in evaluation on that admission and has recommended twice-daily wet-to-dry changes. Since the recent discharge, patient says she has not had a chance to follow up with her oncologist and the wound was getting worse, more discharge and foul odor was present. Please note that patient has not shared this with family and prefers to keep this private.   Assessment & Plan:   Principal Problem:   Cellulitis of left breast, fungating necrotic infected breast mass, metastatic breast cancer - Has been on vancomycin and Zosyn, today is day 5 of antibiotics - breast mass now necrotic, fungating ulcer, due to breast cancer neglect unfortunately  - at this time per surgery team, no surgery indicated, palliative chemo could be done but pt must establish outpatient follow up which so far pt has not done - pt says she is now willing to proceed with further treatments but at this point palliative chemo was the only recommendation per oncology team  - will continue ABX but will change to Bactrim PO, plan d/c home in AM  Active Problems:   Severe protein calorie malnutrition - Provide in sure 3 times a day, Magic cup 3 times a day, multivitamin daily - keep on same regimen for  now    Anemia of likely chronic disease malignancy - no evidence of active bleeding     Thrombocytosis - reactive, no need for further monitoring     Leukocytosis - resolved   DVT prophylaxis: SCDs Code Status: Full Family Communication: pt at bedside, no family at bedside  Disposition Plan: home in AM  Consultants:   Oncology  Surgery  Wound Care team  PCT  Procedures:   None  Antimicrobials:   Vancomycin 10/29/2016 --> 6/11   Zosyn 10/29/2016 --> 6/11  Bactrim 6/11 -->  Subjective: Pt reports no concerns this AM.  Objective: Vitals:   10/31/16 2130 11/01/16 0643 11/01/16 1535 11/02/16 0500  BP: 126/79 (!) 146/80 (!) 123/99 (!) 127/93  Pulse: 84 80 83 84  Resp: 16 18 16 16   Temp: 98.6 F (37 C) 98.2 F (36.8 C) 98.8 F (37.1 C) 98.5 F (36.9 C)  TempSrc: Oral Oral Oral Oral  SpO2: 100% 100% 100% 100%  Weight:      Height:        Intake/Output Summary (Last 24 hours) at 11/02/16 1239 Last data filed at 11/02/16 0817  Gross per 24 hour  Intake             1250 ml  Output              610 ml  Net              640 ml   Filed Weights   10/28/16 2132  Weight: 51.5 kg (113 lb 8.6 oz)    Physical  Exam  Constitutional: Appears calm, NAD CVS: RRR, S1/S2 +, no murmurs, no gallops, no carotid bruit.  Pulmonary: Effort and breath sounds normal, no stridor, rhonchi, wheezes, rales.  Abdominal: Soft. BS +,  no distension, tenderness, rebound or guarding.  Musculoskeletal: Normal range of motion. No edema and no tenderness.  Neuro: Alert. Normal reflexes, muscle tone coordination. No cranial nerve deficit. Skin: left breast necrotic ulcerating mass   Data Reviewed: I have personally reviewed following labs and imaging studies  CBC:  Recent Labs Lab 10/28/16 1644 10/29/16 0412 10/30/16 0412 10/31/16 0344  WBC 10.8* 9.0 9.2 9.2  NEUTROABS 8.6*  --   --   --   HGB 11.7* 9.4* 9.6* 10.2*  HCT 35.9* 29.8* 30.3* 32.3*  MCV 82.3 82.5 83.5 83.5    PLT 495* 390 378 737*   Basic Metabolic Panel:  Recent Labs Lab 10/28/16 1644 10/29/16 0412 10/30/16 0412 10/31/16 0344  NA 140 139 139 141  K 3.8 3.5 4.4 4.3  CL 103 107 103 102  CO2 28 27 27 30   GLUCOSE 122* 103* 102* 103*  BUN 15 11 14 12   CREATININE 0.65 0.71 0.95 0.87  CALCIUM 9.7 8.6* 9.1 9.4   Liver Function Tests:  Recent Labs Lab 10/28/16 1644  AST 30  ALT 18  ALKPHOS 90  BILITOT 0.3  PROT 8.2*  ALBUMIN 4.3    Recent Labs Lab 10/28/16 1644  LIPASE 29   Coagulation Profile:  Recent Labs Lab 10/28/16 1720  INR 0.94   Urine analysis:    Component Value Date/Time   COLORURINE YELLOW 09/25/2016 0230   APPEARANCEUR CLEAR 09/25/2016 0230   LABSPEC 1.019 09/25/2016 0230   PHURINE 5.0 09/25/2016 0230   GLUCOSEU NEGATIVE 09/25/2016 0230   HGBUR NEGATIVE 09/25/2016 0230   BILIRUBINUR NEGATIVE 09/25/2016 0230   KETONESUR 5 (A) 09/25/2016 0230   PROTEINUR NEGATIVE 09/25/2016 0230   NITRITE NEGATIVE 09/25/2016 0230   LEUKOCYTESUR NEGATIVE 09/25/2016 0230   Recent Results (from the past 240 hour(s))  Wound or Superficial Culture     Status: Abnormal   Collection Time: 10/28/16  4:50 PM  Result Value Ref Range Status   Specimen Description BREAST LEFT UPPER OUTER  Final   Special Requests NONE  Final   Gram Stain   Final    RARE WBC PRESENT, PREDOMINANTLY PMN ABUNDANT GRAM POSITIVE COCCI IN PAIRS IN CLUSTERS ABUNDANT GRAM NEGATIVE RODS FEW GRAM POSITIVE RODS Performed at [facility], [address] [place], [place] [address]    Culture MULTIPLE ORGANISMS PRESENT, NONE PREDOMINANT (A)  Final   Report Status 10/31/2016 FINAL  Final  Culture, blood (routine x 2)     Status: None (Preliminary result)   Collection Time: 10/28/16  5:29 PM  Result Value Ref Range Status   Specimen Description BLOOD RIGHT ANTECUBITAL  Final   Special Requests   Final    BOTTLES DRAWN AEROBIC AND ANAEROBIC Blood Culture adequate volume   Culture   Final    NO  GROWTH 4 DAYS Performed at [facility], [place] [address] [place], [place] [address]    Report Status PENDING  Incomplete  Culture, blood (routine x 2)     Status: None (Preliminary result)   Collection Time: 10/28/16 10:06 PM  Result Value Ref Range Status   Specimen Description BLOOD RIGHT ANTECUBITAL  Final   Special Requests IN PEDIATRIC BOTTLE Blood Culture adequate volume  Final   Culture   Final  NO GROWTH 3 DAYS Performed at [facility], [place] [address], [place], [place] [address]    Report Status PENDING  Incomplete     Radiology Studies: Ct Head Wo Contrast Result Date: 10/28/2016 No CT evidence for acute intracranial abnormality.  Ct Chest W Contrast Ct Abdomen Pelvis W Contrast Result Date: 10/28/2016 1. No acute findings.  2. Left breast/chest wall mass with slight progression of pulmonary metastatic disease.  3. Stable left axillary and subpectoral adenopathy.  4. 2.4 cm mass near the porta hepatis between the left lateral hepatic lobe and pancreatic neck possibly metastatic adenopathy.  5. New 16 mm hepatic segment 2 lesion suspicious for hepatic metastatic disease.  Scheduled Meds: . feeding supplement (ENSURE ENLIVE)  237 mL Oral TID BM  . multivitamin with minerals  1 tablet Oral Daily  . sodium hypochlorite   Irrigation BID   Continuous Infusions: . piperacillin-tazobactam (ZOSYN)  IV Stopped (11/02/16 0919)  . vancomycin 750 mg (11/02/16 0520)    LOS: 4 days   Time spent: 25 min  Faye Ramsay, MD [facility] Pager [phone]  If 7PM-7AM, please contact night-coverage [email or web address] Password [facility] 11/02/2016, 12:39 PM

## 2016-11-02 NOTE — Discharge Instructions (Signed)

## 2016-11-02 NOTE — Discharge Summary (Signed)
Physician Discharge Summary  Tayla Panozzo GBT:[id number] DOB: 08/18/1955 DOA: 10/28/2016  PCP: [facility]  Admit date: 10/28/2016 Discharge date: 11/02/2016  Recommendations for Outpatient Follow-up:  1. Pt will need to follow up with PCP in 2-3 weeks post discharge 2. Please obtain BMP to evaluate electrolytes and kidney function 3. Please also check CBC to evaluate Hg and Hct levels 4. Pt also advised to follow up with her oncologist for further care  5. 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 Patient was initially set up with oncologist but has failed to maintain appropriate follow-up appointments. She was recently admitted from 09/25/2016 until 09/27/2016 for fungating, necrotic infected breast mass extending to axilla, was treated with vancomycin and Zosyn and was transitioned to oral Bactrim with plan to follow up with oncologist. Surgery has also seen patient in evaluation on that admission and has recommended twice-daily wet-to-dry changes. Since the recent discharge, patient says she has not had a chance to follow up with her oncologist and the wound was getting worse, more discharge and foul odor was present. Please note that patient has not shared this with family and prefers to keep this private.   Assessment & Plan:   Principal Problem:   Cellulitis of left breast, fungating necrotic infected breast mass, metastatic breast cancer - Has been on vancomycin and Zosyn, today is  day 6 of antibiotics - breast mass now necrotic, fungating ulcer, due to breast cancer neglect unfortunately  - at this time per surgery team, no surgery indicated, palliative chemo could be done but pt must establish outpatient follow up which so far pt has not done - pt says she is now willing to proceed with further treatments but at this point palliative chemo was the only recommendation per oncology team  - will continue ABX upon discharge, will provide script for Bactrim   Active Problems:   Severe protein calorie malnutrition - Provided ensure 3 times a day, Magic cup 3 times a day, multivitamin daily    Anemia of likely chronic disease malignancy - no evidence of active bleeding     Thrombocytosis - reactive, no need for further inpatient monitoring     Leukocytosis - resolved   DVT prophylaxis: SCDs Code Status: Full Family Communication: pt at bedside, no family at bedside  Disposition Plan: home  Consultants:   Oncology  Surgery  Wound Care team  PCT  Procedures:   None  Antimicrobials:   Vancomycin 10/29/2016 --> 6/11   Zosyn 10/29/2016 --> 6/11  Bactrim 6/11 -->  Procedures/Studies: Ct Head Wo Contrast  Result Date: 10/28/2016 CLINICAL DATA:  Left breast mass, diagnosed with breast cancer question metastatic disease EXAM: CT HEAD WITHOUT CONTRAST TECHNIQUE: Contiguous axial images were obtained from the base of the skull through the vertex without intravenous contrast. COMPARISON:  04/07/2007 FINDINGS: Brain: No acute territorial infarction, intracranial hemorrhage, or extra-axial fluid collection. No focal mass, mass effect or midline shift. The ventricles are nonenlarged. Vascular: No hyperdense vessels.  No unexpected calcifications. Skull: Normal. Negative for  fracture or focal lesion. Sinuses/Orbits: No acute finding. Other: None IMPRESSION: No CT evidence for acute intracranial abnormality. Electronically Signed   By: Donavan Foil M.D.   On:  10/28/2016 19:53   Ct Chest W Contrast  Result Date: 10/28/2016 CLINICAL DATA:  L breast mass. Pt states she had mass for one year and mass became infected in May, diagnosed with breast cancer. Pt seen in May and treated with antibiotics. Pt applies dressing herself, wound with green bloody drainage, foul odor. Pt states she completed antibiotics in May, reports mild pain. Pt due to start treatment for cancer next week. EXAM: CT CHEST, ABDOMEN, AND PELVIS WITH CONTRAST TECHNIQUE: Multidetector CT imaging of the chest, abdomen and pelvis was performed following the standard protocol during bolus administration of intravenous contrast. CONTRAST:  165mL ISOVUE-300 IOPAMIDOL (ISOVUE-300) INJECTION 61% COMPARISON:  09/25/2016 and previous studies FINDINGS: CT CHEST FINDINGS Cardiovascular: Heart size normal. No aortic aneurysm. No pericardial effusion. Mediastinum/Nodes: 18 mm low-attenuation right thyroid lesion, stable. No mediastinal adenopathy. Left axillary and subpectoral adenopathy as before. Lungs/Pleura: No pleural effusion. No pneumothorax. Multiple pulmonary nodules in all lobes, largest 19 mm in the inferior lingula image 85/6, previously 16 mm. Musculoskeletal: Irregular partially exophytic left breast/chest wall mass measuring at least 11.8 cm, with adjacent skin thickening and hypervascularity. Stable mild superior endplate compression deformity of T10. No acute fracture. No worrisome bone lesion. CT ABDOMEN PELVIS FINDINGS Hepatobiliary: 18 mm lesion in segment 5 showing peripheral puddling of on the initial phase, filling in on delayed study, suggesting hemangioma, partially seen on previous study 05/06/2016. There is a poorly marginated 16 mm low-attenuation lesion in segment 2 image 43/2, which was not present on the prior study. Gallbladder is nondilated. No biliary ductal dilatation. Pancreas: Prominence of the pancreatic duct. Spleen: Normal in size without focal abnormality. Adrenals/Urinary  Tract: Probable exophytic cyst from the mid right kidney measuring 2.2 cm. No hydronephrosis or solid renal mass. Normal adrenals. Urinary bladder is nondistended. Stomach/Bowel: Limited evaluation without oral contrast. The stomach, small bowel, and colon are nondilated. Moderate colonic fecal material. The appendix is not discretely identified. Vascular/Lymphatic: Circumaortic left renal vein, an anatomic variant. No significant vascular findings are present.There is a 2.4 cm mass with central low-attenuation mass which abuts hepatic segment 3 and the pancreatic neck ; this area was not evaluated on prior chest scans. No other suggestion of abdominal or pelvic adenopathy. Reproductive: Uterus and bilateral adnexa are unremarkable. Other: No ascites.  No free air. Musculoskeletal: Negative for fracture or worrisome osseous lesion. IMPRESSION: 1. No acute findings. 2. Left breast/chest wall mass with slight progression of pulmonary metastatic disease. 3. Stable left axillary and subpectoral adenopathy. 4. 2.4 cm mass near the porta hepatis between the left lateral hepatic lobe and pancreatic neck possibly metastatic adenopathy. 5. New 16 mm hepatic segment 2 lesion suspicious for hepatic metastatic disease. Electronically Signed   By: Lucrezia Europe M.D.   On: 10/28/2016 20:06   Ct Abdomen Pelvis W Contrast  Result Date: 10/28/2016 CLINICAL DATA:  L breast mass. Pt states she had mass for one year and mass became infected in May, diagnosed with breast cancer. Pt seen in May and treated with antibiotics. Pt applies dressing herself, wound with green bloody drainage, foul odor. Pt states she completed antibiotics in May, reports mild pain. Pt due to start treatment for cancer next week. EXAM: CT CHEST, ABDOMEN, AND PELVIS WITH CONTRAST TECHNIQUE: Multidetector CT imaging of the chest, abdomen and pelvis was performed following  the standard protocol during bolus administration of intravenous contrast. CONTRAST:  157mL  ISOVUE-300 IOPAMIDOL (ISOVUE-300) INJECTION 61% COMPARISON:  09/25/2016 and previous studies FINDINGS: CT CHEST FINDINGS Cardiovascular: Heart size normal. No aortic aneurysm. No pericardial effusion. Mediastinum/Nodes: 18 mm low-attenuation right thyroid lesion, stable. No mediastinal adenopathy. Left axillary and subpectoral adenopathy as before. Lungs/Pleura: No pleural effusion. No pneumothorax. Multiple pulmonary nodules in all lobes, largest 19 mm in the inferior lingula image 85/6, previously 16 mm. Musculoskeletal: Irregular partially exophytic left breast/chest wall mass measuring at least 11.8 cm, with adjacent skin thickening and hypervascularity. Stable mild superior endplate compression deformity of T10. No acute fracture. No worrisome bone lesion. CT ABDOMEN PELVIS FINDINGS Hepatobiliary: 18 mm lesion in segment 5 showing peripheral puddling of on the initial phase, filling in on delayed study, suggesting hemangioma, partially seen on previous study 05/06/2016. There is a poorly marginated 16 mm low-attenuation lesion in segment 2 image 43/2, which was not present on the prior study. Gallbladder is nondilated. No biliary ductal dilatation. Pancreas: Prominence of the pancreatic duct. Spleen: Normal in size without focal abnormality. Adrenals/Urinary Tract: Probable exophytic cyst from the mid right kidney measuring 2.2 cm. No hydronephrosis or solid renal mass. Normal adrenals. Urinary bladder is nondistended. Stomach/Bowel: Limited evaluation without oral contrast. The stomach, small bowel, and colon are nondilated. Moderate colonic fecal material. The appendix is not discretely identified. Vascular/Lymphatic: Circumaortic left renal vein, an anatomic variant. No significant vascular findings are present.There is a 2.4 cm mass with central low-attenuation mass which abuts hepatic segment 3 and the pancreatic neck ; this area was not evaluated on prior chest scans. No other suggestion of abdominal or  pelvic adenopathy. Reproductive: Uterus and bilateral adnexa are unremarkable. Other: No ascites.  No free air. Musculoskeletal: Negative for fracture or worrisome osseous lesion. IMPRESSION: 1. No acute findings. 2. Left breast/chest wall mass with slight progression of pulmonary metastatic disease. 3. Stable left axillary and subpectoral adenopathy. 4. 2.4 cm mass near the porta hepatis between the left lateral hepatic lobe and pancreatic neck possibly metastatic adenopathy. 5. New 16 mm hepatic segment 2 lesion suspicious for hepatic metastatic disease. Electronically Signed   By: Lucrezia Europe M.D.   On: 10/28/2016 20:06     Discharge Exam: Vitals:   11/02/16 0500 11/02/16 1539  BP: (!) 127/93 117/78  Pulse: 84 (!) 109  Resp: 16 16  Temp: 98.5 F (36.9 C) 98 F (36.7 C)   Vitals:   11/01/16 0643 11/01/16 1535 11/02/16 0500 11/02/16 1539  BP: (!) 146/80 (!) 123/99 (!) 127/93 117/78  Pulse: 80 83 84 (!) 109  Resp: 18 16 16 16   Temp: 98.2 F (36.8 C) 98.8 F (37.1 C) 98.5 F (36.9 C) 98 F (36.7 C)  TempSrc: Oral Oral Oral Oral  SpO2: 100% 100% 100% 99%  Weight:      Height:        General: Pt is alert, follows commands appropriately, not in acute distress Cardiovascular: Regular rate and rhythm, S1/S2 +, no murmurs, no rubs, no gallops Respiratory: Clear to auscultation bilaterally, no wheezing, no crackles, no rhonchi Abdominal: Soft, non tender, non distended, bowel sounds +, no guarding  Discharge Instructions  Discharge Instructions    Diet - low sodium heart healthy    Complete by:  As directed    Increase activity slowly    Complete by:  As directed      Allergies as of 11/02/2016      Reactions   Aspirin Palpitations  Medication List    STOP taking these medications   lidocaine-prilocaine cream Commonly known as:  EMLA     TAKE these medications   acetaminophen 500 MG tablet Commonly known as:  TYLENOL Take 2 tablets (1,000 mg total) by mouth every  6 (six) hours as needed. What changed:  reasons to take this   feeding supplement (ENSURE ENLIVE) Liqd Take 237 mLs by mouth 3 (three) times daily between meals.   ibuprofen 200 MG tablet Commonly known as:  ADVIL,MOTRIN Take 400 mg by mouth every 4 (four) hours as needed for mild pain or moderate pain.   ondansetron 8 MG tablet Commonly known as:  ZOFRAN Take 1 tablet (8 mg total) by mouth 2 (two) times daily as needed (Nausea or vomiting).   oxyCODONE-acetaminophen 5-325 MG tablet Commonly known as:  PERCOCET/ROXICET Take 1-2 tablets by mouth every 3 (three) hours as needed for moderate pain.   prochlorperazine 10 MG tablet Commonly known as:  COMPAZINE Take 1 tablet (10 mg total) by mouth every 6 (six) hours as needed (Nausea or vomiting).   sulfamethoxazole-trimethoprim 800-160 MG tablet Commonly known as:  BACTRIM DS,SEPTRA DS Take 1 tablet by mouth every 12 (twelve) hours.   traMADol 50 MG tablet Commonly known as:  ULTRAM Take 1 tablet (50 mg total) by mouth every 6 (six) hours as needed.      Follow-up Information    [facility] Follow up.   Specialty:  [facility] Contact information: [address] [place] [address] [phone]            The results of significant diagnostics from this hospitalization (including imaging, microbiology, ancillary and laboratory) are listed below for reference.     Microbiology: Recent Results (from the past 240 hour(s))  Wound or Superficial Culture     Status: Abnormal   Collection Time: 10/28/16  4:50 PM  Result Value Ref Range Status   Specimen Description BREAST LEFT UPPER OUTER  Final   Special Requests NONE  Final   Gram Stain   Final    RARE WBC PRESENT, PREDOMINANTLY PMN ABUNDANT GRAM POSITIVE COCCI IN PAIRS IN CLUSTERS ABUNDANT GRAM NEGATIVE RODS FEW GRAM POSITIVE RODS Performed at [facility], [place] [address] [place], [place] [address]    Culture MULTIPLE ORGANISMS  PRESENT, NONE PREDOMINANT (A)  Final   Report Status 10/31/2016 FINAL  Final  Culture, blood (routine x 2)     Status: None   Collection Time: 10/28/16  5:29 PM  Result Value Ref Range Status   Specimen Description BLOOD RIGHT ANTECUBITAL  Final   Special Requests   Final    BOTTLES DRAWN AEROBIC AND ANAEROBIC Blood Culture adequate volume   Culture   Final    NO GROWTH 5 DAYS Performed at [facility], [place] [address], [place], [place] [address]    Report Status 11/02/2016 FINAL  Final  Culture, blood (routine x 2)     Status: None (Preliminary result)   Collection Time: 10/28/16 10:06 PM  Result Value Ref Range Status   Specimen Description BLOOD RIGHT ANTECUBITAL  Final   Special Requests IN PEDIATRIC BOTTLE Blood Culture adequate volume  Final   Culture   Final    NO GROWTH 4 DAYS Performed at [facility], [place] [address], [place],  [address]    Report Status PENDING  Incomplete     Labs: Basic Metabolic Panel:  Recent Labs Lab 10/28/16 2094  10/29/16 0412 10/30/16 0412 10/31/16 0344  NA 140 139 139 141  K 3.8 3.5 4.4 4.3  CL 103 107 103 102  CO2 28 27 27 30   GLUCOSE 122* 103* 102* 103*  BUN 15 11 14 12   CREATININE 0.65 0.71 0.95 0.87  CALCIUM 9.7 8.6* 9.1 9.4   Liver Function Tests:  Recent Labs Lab 10/28/16 1644  AST 30  ALT 18  ALKPHOS 90  BILITOT 0.3  PROT 8.2*  ALBUMIN 4.3    Recent Labs Lab 10/28/16 1644  LIPASE 29   No results for input(s): AMMONIA in the last 168 hours. CBC:  Recent Labs Lab 10/28/16 1644 10/29/16 0412 10/30/16 0412 10/31/16 0344  WBC 10.8* 9.0 9.2 9.2  NEUTROABS 8.6*  --   --   --   HGB 11.7* 9.4* 9.6* 10.2*  HCT 35.9* 29.8* 30.3* 32.3*  MCV 82.3 82.5 83.5 83.5  PLT 495* 390 378 454*   SIGNED: Time coordinating discharge: 30 minutes  Faye Ramsay, MD  [facility] 11/02/2016, 4:44 PM Pager [phone]  If 7PM-7AM, please contact night-coverage [email or web address] Password  [email or web address]

## 2016-11-02 NOTE — Progress Notes (Signed)
CSW met with pt to confirm DC plan. Pt reports she is in process of moving into an apartment and waiting to hear back from her friend to see if logistics can be arranged today (friend needing to go sign lease today). If this cannot be managed today, she states she will go stay in a hotel.  Denies further needs.  Sharren Bridge, MSW, LCSW Clinical Social Work 11/02/2016 [phone]

## 2016-11-03 ENCOUNTER — Other Ambulatory Visit: Payer: Self-pay

## 2016-11-03 LAB — CULTURE, BLOOD (ROUTINE X 2)
Culture: NO GROWTH
Special Requests: ADEQUATE

## 2016-11-04 ENCOUNTER — Emergency Department (HOSPITAL_COMMUNITY): Payer: Medicaid Other

## 2016-11-04 ENCOUNTER — Emergency Department (HOSPITAL_COMMUNITY)
Admission: EM | Admit: 2016-11-04 | Discharge: 2016-11-04 | Disposition: A | Discharge status: Home / Self Care | Payer: Medicaid Other | Attending: Emergency Medicine | Admitting: Emergency Medicine

## 2016-11-04 ENCOUNTER — Encounter (HOSPITAL_COMMUNITY): Payer: Self-pay | Admitting: Nurse Practitioner

## 2016-11-04 ENCOUNTER — Telehealth: Payer: Self-pay | Admitting: *Deleted

## 2016-11-04 DIAGNOSIS — Z79899 Other long term (current) drug therapy: Secondary | ICD-10-CM | POA: Diagnosis not present

## 2016-11-04 DIAGNOSIS — Y999 Unspecified external cause status: Secondary | ICD-10-CM | POA: Insufficient documentation

## 2016-11-04 DIAGNOSIS — Y929 Unspecified place or not applicable: Secondary | ICD-10-CM | POA: Insufficient documentation

## 2016-11-04 DIAGNOSIS — K5909 Other constipation: Secondary | ICD-10-CM

## 2016-11-04 DIAGNOSIS — C7800 Secondary malignant neoplasm of unspecified lung: Secondary | ICD-10-CM | POA: Insufficient documentation

## 2016-11-04 DIAGNOSIS — K5900 Constipation, unspecified: Secondary | ICD-10-CM | POA: Insufficient documentation

## 2016-11-04 DIAGNOSIS — C50412 Malignant neoplasm of upper-outer quadrant of left female breast: Secondary | ICD-10-CM | POA: Diagnosis not present

## 2016-11-04 DIAGNOSIS — Y939 Activity, unspecified: Secondary | ICD-10-CM | POA: Insufficient documentation

## 2016-11-04 DIAGNOSIS — N6320 Unspecified lump in the left breast, unspecified quadrant: Secondary | ICD-10-CM | POA: Diagnosis present

## 2016-11-04 DIAGNOSIS — N63 Unspecified lump in unspecified breast: Secondary | ICD-10-CM

## 2016-11-04 LAB — CBC WITH DIFFERENTIAL/PLATELET
BASOS ABS: 0 10*3/uL (ref 0.0–0.1)
BASOS PCT: 0 %
EOS ABS: 0.2 10*3/uL (ref 0.0–0.7)
EOS PCT: 2 %
HEMATOCRIT: 28.7 % — AB (ref 36.0–46.0)
Hemoglobin: 9.2 g/dL — ABNORMAL LOW (ref 12.0–15.0)
Lymphocytes Relative: 6 %
Lymphs Abs: 0.7 10*3/uL (ref 0.7–4.0)
MCH: 26.3 pg (ref 26.0–34.0)
MCHC: 32.1 g/dL (ref 30.0–36.0)
MCV: 82 fL (ref 78.0–100.0)
MONO ABS: 0.7 10*3/uL (ref 0.1–1.0)
MONOS PCT: 7 %
Neutro Abs: 9.5 10*3/uL — ABNORMAL HIGH (ref 1.7–7.7)
Neutrophils Relative %: 85 %
PLATELETS: 463 10*3/uL — AB (ref 150–400)
RBC: 3.5 MIL/uL — ABNORMAL LOW (ref 3.87–5.11)
RDW: 13.8 % (ref 11.5–15.5)
WBC: 11.1 10*3/uL — ABNORMAL HIGH (ref 4.0–10.5)

## 2016-11-04 LAB — COMPREHENSIVE METABOLIC PANEL
ALBUMIN: 3.6 g/dL (ref 3.5–5.0)
ALT: 67 U/L — ABNORMAL HIGH (ref 14–54)
AST: 77 U/L — AB (ref 15–41)
Alkaline Phosphatase: 106 U/L (ref 38–126)
Anion gap: 9 (ref 5–15)
BILIRUBIN TOTAL: 0.4 mg/dL (ref 0.3–1.2)
BUN: 16 mg/dL (ref 6–20)
CHLORIDE: 101 mmol/L (ref 101–111)
CO2: 26 mmol/L (ref 22–32)
Calcium: 9.3 mg/dL (ref 8.9–10.3)
Creatinine, Ser: 0.98 mg/dL (ref 0.44–1.00)
GFR calc Af Amer: >60 mL/min (ref >60)
GFR calc non Af Amer: >60 mL/min (ref >60)
GLUCOSE: 123 mg/dL — AB (ref 65–99)
POTASSIUM: 4.2 mmol/L (ref 3.5–5.1)
Sodium: 136 mmol/L (ref 135–145)
TOTAL PROTEIN: 7.3 g/dL (ref 6.5–8.1)

## 2016-11-04 LAB — I-STAT CG4 LACTIC ACID, ED: LACTIC ACID, VENOUS: 1.82 mmol/L (ref 0.5–1.9)

## 2016-11-04 MED ORDER — SODIUM CHLORIDE 0.9 % IV BOLUS (SEPSIS)
1000.0000 mL | Freq: Once | INTRAVENOUS | Status: AC
  Administered 2016-11-04: 1000 mL via INTRAVENOUS

## 2016-11-04 MED ORDER — FLEET ENEMA 7-19 GM/118ML RE ENEM
1.0000 | ENEMA | Freq: Once | RECTAL | Status: AC
  Administered 2016-11-04: 1 via RECTAL
  Filled 2016-11-04: qty 1

## 2016-11-04 MED ORDER — SILVER NITRATE-POT NITRATE 75-25 % EX MISC
5.0000 "application " | Freq: Once | CUTANEOUS | Status: AC
  Administered 2016-11-04: 5 via TOPICAL
  Filled 2016-11-04: qty 1

## 2016-11-04 MED ORDER — POLYETHYLENE GLYCOL 3350 17 G PO PACK: 17.0000 g | PACK | Freq: Every day | ORAL | 0 refills | Status: DC

## 2016-11-04 NOTE — ED Notes (Signed)
Bed: WA09 Expected date: 11/04/16 Expected time:  Means of arrival:  Comments: EMS-wound issues/constipation

## 2016-11-04 NOTE — ED Triage Notes (Signed)
Patient coming from a motel EMS called for transport to hospital for a oozing wound on left chest site.

## 2016-11-04 NOTE — Discharge Instructions (Signed)
Continue your antibiotics as prescribed from your recent hospitalization.   Use enema when you get back to motel to help you with your constipation   Take miralax daily until your stool is soft for a week   See oncology as scheduled this week   Return to ER if you have bleeding from the mass, purulent drainage, fever, vomiting, severe abdominal pain

## 2016-11-04 NOTE — ED Provider Notes (Signed)
[facility] DEPT Provider Note   CSN: [id number] Arrival date & time: 11/04/16  1030     History   Chief Complaint No chief complaint on file.   HPI Ann Oliver is a 61 y.o. female history of fungating breast mass here presenting with bleeding from the mass. Patient had multiple admissions for this and most recently was discharged 2 days ago. Palliative care saw the patient in the hospital and patient seemed indecisive about her plan and wanted to remain full code but does not want to get much treatment. She is currently going from hotel to hotel. This morning she noticed that the dressing is leaking and there is some bleeding from the mass. Denies any fevers or chills or vomiting. She noticed that she's been constipated for the last 2-3 days and states that she has a history of constipation.  The history is provided by the patient.    Past Medical History:  Diagnosis Date  . Cancer ([place])   . Sinusitis   . Tumor cells     Patient Active Problem List   Diagnosis Date Noted  . Cellulitis 10/28/2016  . Wound infection 10/28/2016  . Cellulitis of left breast 10/28/2016  . Encounter for palliative care   . Goals of care, counseling/discussion   . Breast cancer metastasized to lung, right ([place]) 09/25/2016  . Malnutrition of moderate degree 09/25/2016  . Breast cancer of upper-outer quadrant of left female breast ([place]) 10/22/2015  . THYROID NODULE, RIGHT 05/31/2007  . DEPRESSION 05/31/2007  . GERD 05/31/2007  . HOT FLASHES 05/31/2007  . HEADACHE 05/31/2007    Past Surgical History:  Procedure Laterality Date  . BIOPSY BREAST      OB History    No data available       Home Medications    Prior to Admission medications   Medication Sig Start Date End Date Taking? Authorizing Provider  acetaminophen (TYLENOL) 500 MG tablet Take 2 tablets (1,000 mg total) by mouth every 6 (six) hours as needed. Patient taking differently: Take 1,000 mg by mouth every 6 (six)  hours as needed for moderate pain.  10/09/16  Yes Charlesetta Shanks, MD  feeding supplement, ENSURE ENLIVE, (ENSURE ENLIVE) LIQD Take 237 mLs by mouth 3 (three) times daily between meals. 09/26/16  Yes [name], MD  oxyCODONE-acetaminophen (PERCOCET/ROXICET) 5-325 MG tablet Take 1-2 tablets by mouth every 3 (three) hours as needed for moderate pain. 11/02/16  Yes Theodis Blaze, MD  sulfamethoxazole-trimethoprim (BACTRIM DS,SEPTRA DS) 800-160 MG tablet Take 1 tablet by mouth every 12 (twelve) hours. 11/02/16  Yes Theodis Blaze, MD  ondansetron (ZOFRAN) 8 MG tablet Take 1 tablet (8 mg total) by mouth 2 (two) times daily as needed (Nausea or vomiting). Patient not taking: Reported on 10/14/2016 10/01/16   Nicholas Lose, MD  prochlorperazine (COMPAZINE) 10 MG tablet Take 1 tablet (10 mg total) by mouth every 6 (six) hours as needed (Nausea or vomiting). Patient not taking: Reported on 10/14/2016 10/01/16   Nicholas Lose, MD  traMADol (ULTRAM) 50 MG tablet Take 1 tablet (50 mg total) by mouth every 6 (six) hours as needed. Patient not taking: Reported on 11/04/2016 10/01/16   Nicholas Lose, MD    Family History Family History  Problem Relation Age of Onset  . Breast cancer Neg Hx     Social History Social History  Substance Use Topics  . Smoking status: Never Smoker  . Smokeless tobacco: Never Used  . Alcohol use No  Allergies   Aspirin   Review of Systems Review of Systems  Gastrointestinal: Positive for constipation.  Skin: Positive for wound.  All other systems reviewed and are negative.    Physical Exam Updated Vital Signs BP 140/60 (BP Location: Right Arm)   Pulse 62   Temp 98.2 F (36.8 C) (Oral)   Resp 16   Ht 5\' 1"  (1.549 m)   Wt 51.3 kg (113 lb)   SpO2 99%   BMI 21.35 kg/m   Physical Exam  Constitutional: She is oriented to person, place, and time.  Chronically ill   HENT:  Head: Normocephalic.  Eyes: EOM are normal. Pupils are equal, round, and reactive to  light.  Neck: Normal range of motion. Neck supple.  Cardiovascular: Normal rate, regular rhythm and normal heart sounds.   Pulmonary/Chest: Effort normal and breath sounds normal. No respiratory distress. She has no wheezes.  L breast fungating mass with some drainage and bleeding   Abdominal: Soft. Bowel sounds are normal.  Mild LLQ tenderness, no rebound   Musculoskeletal: Normal range of motion.  Neurological: She is alert and oriented to person, place, and time.  Skin: Skin is warm.  Nursing note and vitals reviewed.    ED Treatments / Results  Labs (all labs ordered are listed, but only abnormal results are displayed) Labs Reviewed  CBC WITH DIFFERENTIAL/PLATELET - Abnormal; Notable for the following:       Result Value   WBC 11.1 (*)    RBC 3.50 (*)    Hemoglobin 9.2 (*)    HCT 28.7 (*)    Platelets 463 (*)    Neutro Abs 9.5 (*)    All other components within normal limits  COMPREHENSIVE METABOLIC PANEL - Abnormal; Notable for the following:    Glucose, Bld 123 (*)    AST 77 (*)    ALT 67 (*)    All other components within normal limits  I-STAT CG4 LACTIC ACID, ED    EKG  EKG Interpretation None       Radiology Dg Abd Acute W/chest  Result Date: 11/04/2016 CLINICAL DATA:  Large left chest mass with leak age. EXAM: DG ABDOMEN ACUTE W/ 1V CHEST COMPARISON:  CT 10/28/2016 FINDINGS: Heart size is normal. Large left chest mass remains evident. Multiple pulmonary nodules present consistent with metastatic disease as previously shown. No evidence of active infiltrate, collapse or effusion. No bony abnormality seen in the chest. Two view abdominal radiograph show a large amount of fecal matter within the colon. No abnormal calcifications or bone findings. IMPRESSION: Large left chest mass with pulmonary metastases as seen previously. No acute finding other than a large amount of fecal matter in the colon. Electronically Signed   By: Nelson Chimes M.D.   On: 11/04/2016 11:38      Procedures Procedures (including critical care time)  The wound is cleansed, debrided of foreign material as much as possible, and dressed. The patient is alerted to watch for any signs of infection (redness, pus, pain, increased swelling or fever) and call if such occurs. Home wound care instructions are provided. I applied wound seal and surgicel and ABD and then gauze and tight dressing and bleeding controlled.    Medications Ordered in ED Medications  sodium chloride 0.9 % bolus 1,000 mL (0 mLs Intravenous Stopped 11/04/16 1219)  silver nitrate applicators applicator 5 application (5 application Topical Given 11/04/16 1119)  sodium phosphate (FLEET) 7-19 GM/118ML enema 1 enema (1 enema Rectal Given 11/04/16 1219)  Initial Impression / Assessment and Plan / ED Course  I have reviewed the triage vital signs and the nursing notes.  Pertinent labs & imaging results that were available during my care of the patient were reviewed by me and considered in my medical decision making (see chart for details).     Mechille Varghese is a 61 y.o. female here with bleeding from the breast mass and constipation. Was discharged from hospital 2 days ago and is currently on abx. There is some drainage from the mass but no obvious abscess. Will remove the dressing and try to maintain hemostasis. Has oncology appointment in 2 days but she hasn't been going to previous oncology appointments. Will check labs and abdominal xray.   12:28 PM Hg 9.2, stable from 10 2 days ago. Xray showed constipation. Bleeding controlled with wound seal and surgicel and compression dressing. She refused rectal exam. Offered enema but wants to get it done at her motel. Recommend miralax daily for constipation. Encouraged her to see oncology this Friday as scheduled.   Final Clinical Impressions(s) / ED Diagnoses   Final diagnoses:  None    New Prescriptions New Prescriptions   No medications on file     Drenda Freeze, MD 11/04/16 1230

## 2016-11-04 NOTE — Telephone Encounter (Signed)
On Tuesday 6/12 patient was a no show for the 3rd time for chemo class.  Can meet with patient if she comes for her first chemo on 6/18.

## 2016-11-05 ENCOUNTER — Other Ambulatory Visit: Payer: Self-pay | Admitting: Radiology

## 2016-11-05 ENCOUNTER — Telehealth: Payer: Self-pay

## 2016-11-05 NOTE — Telephone Encounter (Signed)
Attempted to call pt 3x throughout the day today but unable to get a hold of patient. Phone rings and does not go to voicemail. Would like to confirm time/date of appts tomorrow.

## 2016-11-06 ENCOUNTER — Emergency Department (HOSPITAL_COMMUNITY)
Admission: EM | Admit: 2016-11-06 | Discharge: 2016-11-06 | Disposition: A | Discharge status: Home / Self Care | Payer: Medicaid Other | Attending: Emergency Medicine | Admitting: Emergency Medicine

## 2016-11-06 ENCOUNTER — Ambulatory Visit (HOSPITAL_COMMUNITY)
Admission: RE | Admit: 2016-11-06 | Discharge: 2016-11-06 | Disposition: A | Discharge status: Home / Self Care | Payer: Medicaid Other | Source: Ambulatory Visit

## 2016-11-06 ENCOUNTER — Ambulatory Visit (HOSPITAL_COMMUNITY): Admission: RE | Admit: 2016-11-06 | Payer: Medicaid Other | Source: Ambulatory Visit

## 2016-11-06 ENCOUNTER — Other Ambulatory Visit: Payer: Self-pay | Admitting: Hematology and Oncology

## 2016-11-06 ENCOUNTER — Ambulatory Visit (HOSPITAL_COMMUNITY)
Admission: RE | Admit: 2016-11-06 | Discharge: 2016-11-06 | Disposition: A | Discharge status: Home / Self Care | Payer: Medicaid Other | Source: Ambulatory Visit | Attending: Hematology and Oncology | Admitting: Hematology and Oncology

## 2016-11-06 ENCOUNTER — Other Ambulatory Visit: Payer: Medicaid Other

## 2016-11-06 ENCOUNTER — Encounter (HOSPITAL_COMMUNITY): Payer: Self-pay | Admitting: Radiology

## 2016-11-06 ENCOUNTER — Ambulatory Visit: Payer: Medicaid Other

## 2016-11-06 DIAGNOSIS — Z4801 Encounter for change or removal of surgical wound dressing: Secondary | ICD-10-CM | POA: Diagnosis not present

## 2016-11-06 DIAGNOSIS — C50412 Malignant neoplasm of upper-outer quadrant of left female breast: Secondary | ICD-10-CM

## 2016-11-06 DIAGNOSIS — Z79899 Other long term (current) drug therapy: Secondary | ICD-10-CM | POA: Insufficient documentation

## 2016-11-06 DIAGNOSIS — C50912 Malignant neoplasm of unspecified site of left female breast: Secondary | ICD-10-CM | POA: Insufficient documentation

## 2016-11-06 DIAGNOSIS — N6320 Unspecified lump in the left breast, unspecified quadrant: Secondary | ICD-10-CM

## 2016-11-06 DIAGNOSIS — C50919 Malignant neoplasm of unspecified site of unspecified female breast: Secondary | ICD-10-CM | POA: Insufficient documentation

## 2016-11-06 DIAGNOSIS — C799 Secondary malignant neoplasm of unspecified site: Secondary | ICD-10-CM

## 2016-11-06 DIAGNOSIS — Z170 Estrogen receptor positive status [ER+]: Secondary | ICD-10-CM

## 2016-11-06 HISTORY — PX: IR FLUORO GUIDE CV LINE RIGHT: IMG2283

## 2016-11-06 HISTORY — PX: IR US GUIDE VASC ACCESS RIGHT: IMG2390

## 2016-11-06 MED ORDER — HEPARIN SOD (PORK) LOCK FLUSH 100 UNIT/ML IV SOLN
INTRAVENOUS | Status: DC
Start: 2016-11-06 — End: 2016-11-07
  Filled 2016-11-06: qty 5

## 2016-11-06 MED ORDER — ACETAMINOPHEN 500 MG PO TABS
1000.0000 mg | ORAL_TABLET | Freq: Once | ORAL | Status: AC
  Administered 2016-11-06: 1000 mg via ORAL
  Filled 2016-11-06: qty 2

## 2016-11-06 MED ORDER — HEPARIN SOD (PORK) LOCK FLUSH 100 UNIT/ML IV SOLN
INTRAVENOUS | Status: DC | PRN
  Administered 2016-11-06: 500 [IU]

## 2016-11-06 MED ORDER — LIDOCAINE HCL 1 % IJ SOLN
INTRAMUSCULAR | Status: AC
  Filled 2016-11-06: qty 20

## 2016-11-06 MED ORDER — LIDOCAINE HCL 1 % IJ SOLN
INTRAMUSCULAR | Status: DC | PRN
  Administered 2016-11-06: 5 mL

## 2016-11-06 NOTE — ED Notes (Signed)
MD made aware patient would like pain medicine.

## 2016-11-06 NOTE — Procedures (Addendum)
Ultrasound/fluoroscopic guided placement of right single lumen basilic vein PICC performed.  Length 39 cm. Tip SVC /RA junction. Medication used- 1% lidocaine to skin/SQ tissue. No immediate complications.Ok to use. Above case d/w Dr. Jana Hakim - pt unable to get port today due to not having appropriate transport arranged for conscious sedation protocol.

## 2016-11-06 NOTE — ED Provider Notes (Signed)
[facility] DEPT Provider Note   CSN: [id number] Arrival date & time: 11/06/16  1447     History   Chief Complaint Chief Complaint  Patient presents with  . Wound Check    HPI Linder Prajapati is a 61 y.o. female.  Patient c/o bleeding from small area on large left breast mass.  Has chronic breast mass x 1+ years, metastatic breast ca, states has f/u this Monday w oncology, possibly to start chemo.  States intermittently will get small area that bleeds. No other abnormal bruising or bleeding. No chest pain or sob. No fever or chills. Notes small quantity of bleeding, only partially soiling small area on old dressing.    The history is provided by the patient.  Wound Check  Pertinent negatives include no chest pain, no abdominal pain, no headaches and no shortness of breath.    Past Medical History:  Diagnosis Date  . Cancer ([place])   . Sinusitis   . Tumor cells     Patient Active Problem List   Diagnosis Date Noted  . Cellulitis 10/28/2016  . Wound infection 10/28/2016  . Cellulitis of left breast 10/28/2016  . Encounter for palliative care   . Goals of care, counseling/discussion   . Breast cancer metastasized to lung, right ([place]) 09/25/2016  . Malnutrition of moderate degree 09/25/2016  . Breast cancer of upper-outer quadrant of left female breast ([place]) 10/22/2015  . THYROID NODULE, RIGHT 05/31/2007  . DEPRESSION 05/31/2007  . GERD 05/31/2007  . HOT FLASHES 05/31/2007  . HEADACHE 05/31/2007    Past Surgical History:  Procedure Laterality Date  . BIOPSY BREAST    . IR FLUORO GUIDE CV LINE RIGHT  11/06/2016  . IR US GUIDE VASC ACCESS RIGHT  11/06/2016    OB History    No data available       Home Medications    Prior to Admission medications   Medication Sig Start Date End Date Taking? Authorizing Provider  feeding supplement, ENSURE ENLIVE, (ENSURE ENLIVE) LIQD Take 237 mLs by mouth 3 (three) times daily between meals. 09/26/16  Yes [name], MD    oxyCODONE-acetaminophen (PERCOCET/ROXICET) 5-325 MG tablet Take 1-2 tablets by mouth every 3 (three) hours as needed for moderate pain. 11/02/16  Yes Theodis Blaze, MD  polyethylene glycol [facility]ing) packet Take 17 g by mouth daily. 11/04/16  Yes Drenda Freeze, MD  sulfamethoxazole-trimethoprim (BACTRIM DS,SEPTRA DS) 800-160 MG tablet Take 1 tablet by mouth every 12 (twelve) hours. 11/02/16  Yes Theodis Blaze, MD  traMADol (ULTRAM) 50 MG tablet Take 1 tablet (50 mg total) by mouth every 6 (six) hours as needed. 10/01/16  Yes Nicholas Lose, MD  acetaminophen (TYLENOL) 500 MG tablet Take 2 tablets (1,000 mg total) by mouth every 6 (six) hours as needed. Patient not taking: Reported on 11/06/2016 10/09/16   Charlesetta Shanks, MD  ondansetron (ZOFRAN) 8 MG tablet Take 1 tablet (8 mg total) by mouth 2 (two) times daily as needed (Nausea or vomiting). Patient not taking: Reported on 10/14/2016 10/01/16   Nicholas Lose, MD  prochlorperazine (COMPAZINE) 10 MG tablet Take 1 tablet (10 mg total) by mouth every 6 (six) hours as needed (Nausea or vomiting). Patient not taking: Reported on 10/14/2016 10/01/16   Nicholas Lose, MD    Family History Family History  Problem Relation Age of Onset  . Breast cancer Neg Hx     Social History Social History  Substance Use Topics  . Smoking status: Never  Smoker  . Smokeless tobacco: Never Used  . Alcohol use No     Allergies   Aspirin   Review of Systems Review of Systems  Constitutional: Negative for chills and fever.  HENT: Negative for sore throat.   Eyes: Negative for redness.  Respiratory: Negative for shortness of breath.   Cardiovascular: Negative for chest pain.  Gastrointestinal: Negative for abdominal pain.  Genitourinary: Negative for flank pain.  Musculoskeletal: Negative for neck pain.  Skin: Negative for rash.  Neurological: Negative for headaches.  Hematological: Does not bruise/bleed easily.  Psychiatric/Behavioral:  Negative for confusion.     Physical Exam Updated Vital Signs BP 91/67 (BP Location: Left Arm)   Pulse 88   Temp 98.2 F (36.8 C) (Oral)   Resp 16   SpO2 98%   Physical Exam  Constitutional: She appears well-developed and well-nourished. No distress.  HENT:  Mouth/Throat: Oropharynx is clear and moist.  Eyes: Conjunctivae are normal. No scleral icterus.  Neck: Neck supple. No tracheal deviation present.  Cardiovascular: Normal rate, regular rhythm, normal heart sounds and intact distal pulses.   Pulmonary/Chest: Effort normal and breath sounds normal. No respiratory distress.  Large left breast mass/cancer.  A few areas w small, punctate areas blood. No brisk bleeding. No abscess. No cellulitis.   Abdominal: Soft. Normal appearance. She exhibits no distension. There is no tenderness.  Musculoskeletal: She exhibits no edema.  Neurological: She is alert.  Skin: Skin is warm and dry. No rash noted. She is not diaphoretic.  Psychiatric: She has a normal mood and affect.  Nursing note and vitals reviewed.    ED Treatments / Results  Labs (all labs ordered are listed, but only abnormal results are displayed) Labs Reviewed - No data to display  EKG  EKG Interpretation None       Radiology Ir Fluoro Guide Cv Line Right  Result Date: 11/06/2016 INDICATION: Metastatic breast cancer; central venous access requested for planned chemotherapy. EXAM: RIGHT UPPER EXTREMITY PICC LINE PLACEMENT WITH ULTRASOUND AND FLUOROSCOPIC GUIDANCE MEDICATIONS: 1% lidocaine to skin and subcutaneous tissue ANESTHESIA/SEDATION: None FLUOROSCOPY TIME:  Fluoroscopy Time:  42 seconds COMPLICATIONS: None immediate. PROCEDURE: The patient was advised of the possible risks and complications and agreed to undergo the procedure. The patient was then brought to the angiographic suite for the procedure. The right arm was prepped with chlorhexidine, draped in the usual sterile fashion using maximum barrier  technique (cap and mask, sterile gown, sterile gloves, large sterile sheet, hand hygiene and cutaneous antisepsis) and infiltrated locally with 1% Lidocaine. Ultrasound demonstrated patency of the right basilic vein, and this was documented with an image. Under real-time ultrasound guidance, this vein was accessed with a 21 gauge micropuncture needle and image documentation was performed. A 0.018 wire was introduced in to the vein. Over this, a 5 [place] single lumen power injectable PICC was advanced to the lower SVC/right atrial junction. Fluoroscopy during the procedure and fluoro spot radiograph confirms appropriate catheter position. The catheter was flushed and covered with a sterile dressing. Catheter length: 39 cm IMPRESSION: Successful right arm power PICC line placement with ultrasound and fluoroscopic guidance. The catheter is ready for use. Read by: Rowe Robert, PA-C Electronically Signed   By: Sandi Mariscal M.D.   On: 11/06/2016 13:48   Ir US Guide Vasc Access Right  Result Date: 11/06/2016 INDICATION: Metastatic breast cancer; central venous access requested for planned chemotherapy. EXAM: RIGHT UPPER EXTREMITY PICC LINE PLACEMENT WITH ULTRASOUND AND FLUOROSCOPIC GUIDANCE MEDICATIONS: 1% lidocaine to  skin and subcutaneous tissue ANESTHESIA/SEDATION: None FLUOROSCOPY TIME:  Fluoroscopy Time:  42 seconds COMPLICATIONS: None immediate. PROCEDURE: The patient was advised of the possible risks and complications and agreed to undergo the procedure. The patient was then brought to the angiographic suite for the procedure. The right arm was prepped with chlorhexidine, draped in the usual sterile fashion using maximum barrier technique (cap and mask, sterile gown, sterile gloves, large sterile sheet, hand hygiene and cutaneous antisepsis) and infiltrated locally with 1% Lidocaine. Ultrasound demonstrated patency of the right basilic vein, and this was documented with an image. Under real-time ultrasound  guidance, this vein was accessed with a 21 gauge micropuncture needle and image documentation was performed. A 0.018 wire was introduced in to the vein. Over this, a 5 [place] single lumen power injectable PICC was advanced to the lower SVC/right atrial junction. Fluoroscopy during the procedure and fluoro spot radiograph confirms appropriate catheter position. The catheter was flushed and covered with a sterile dressing. Catheter length: 39 cm IMPRESSION: Successful right arm power PICC line placement with ultrasound and fluoroscopic guidance. The catheter is ready for use. Read by: Rowe Robert, PA-C Electronically Signed   By: Sandi Mariscal M.D.   On: 11/06/2016 13:48    Procedures Procedures (including critical care time)  Medications Ordered in ED Medications - No data to display   Initial Impression / Assessment and Plan / ED Course  I have reviewed the triage vital signs and the nursing notes.  Pertinent labs & imaging results that were available during my care of the patient were reviewed by me and considered in my medical decision making (see chart for details).  Old dressing removed. Wound cleaned. Quick clot applied to few punctate areas minimal hemorrhage. Non adherent sterile dressing.    Pt denies other new c/o. States bowels moving better w recent tx.   Patient has plans to f/u this Monday.  Po fluids.    Final Clinical Impressions(s) / ED Diagnoses   Final diagnoses:  None    New Prescriptions New Prescriptions   No medications on file     Lajean Saver, MD 11/06/16 [phone]

## 2016-11-06 NOTE — ED Notes (Signed)
Dressing applied to left breast. Non adherant bandage, covered with ABD pads, secured with paper tape. Old tape removed from skin.

## 2016-11-06 NOTE — ED Triage Notes (Addendum)
Pt sent by her doctor for wound to left breast mass. Hx of multiple hospital visits for bleeding and infections of same wound. No fevers, chills. Will start radiation and chemotherapy for breast mass on Monday.

## 2016-11-06 NOTE — Discharge Instructions (Signed)
It was our pleasure to provide your ER care today - we hope that you feel better.  Rest. Drink adequate fluids.   Keep area very clean.   Follow up with your doctors Monday as planned.  Return to ER if worse, new symptoms, persistent or heavy bleeding, fevers, other concern.

## 2016-11-06 NOTE — ED Notes (Signed)
Drink given 

## 2016-11-06 NOTE — Progress Notes (Signed)
Pt. Unable to give phone number to RN for ride home, pt. Stated" they will come at 5:30, I can just wait in the ER" Pt. Instructed that she could not just wait in the ER and that we had to verify that she had a ride home, Pt. Gave nurse a disconnected number, pt. Unable to give any other numbers for a ride home. Rowe Robert PA, in talking with pt., procedure switched to PICC line.

## 2016-11-07 ENCOUNTER — Encounter (HOSPITAL_COMMUNITY): Payer: Self-pay | Admitting: Emergency Medicine

## 2016-11-07 ENCOUNTER — Emergency Department (HOSPITAL_COMMUNITY)
Admission: EM | Admit: 2016-11-07 | Discharge: 2016-11-07 | Disposition: A | Discharge status: Home / Self Care | Payer: Medicaid Other | Attending: Emergency Medicine | Admitting: Emergency Medicine

## 2016-11-07 DIAGNOSIS — Y939 Activity, unspecified: Secondary | ICD-10-CM | POA: Diagnosis not present

## 2016-11-07 DIAGNOSIS — Z79899 Other long term (current) drug therapy: Secondary | ICD-10-CM | POA: Insufficient documentation

## 2016-11-07 DIAGNOSIS — X58XXXA Exposure to other specified factors, initial encounter: Secondary | ICD-10-CM | POA: Diagnosis not present

## 2016-11-07 DIAGNOSIS — N63 Unspecified lump in unspecified breast: Secondary | ICD-10-CM

## 2016-11-07 DIAGNOSIS — Z853 Personal history of malignant neoplasm of breast: Secondary | ICD-10-CM | POA: Insufficient documentation

## 2016-11-07 DIAGNOSIS — Y929 Unspecified place or not applicable: Secondary | ICD-10-CM | POA: Diagnosis not present

## 2016-11-07 DIAGNOSIS — Y999 Unspecified external cause status: Secondary | ICD-10-CM | POA: Diagnosis not present

## 2016-11-07 DIAGNOSIS — Z5189 Encounter for other specified aftercare: Secondary | ICD-10-CM

## 2016-11-07 DIAGNOSIS — K5900 Constipation, unspecified: Secondary | ICD-10-CM | POA: Insufficient documentation

## 2016-11-07 DIAGNOSIS — Z760 Encounter for issue of repeat prescription: Secondary | ICD-10-CM | POA: Diagnosis not present

## 2016-11-07 DIAGNOSIS — C50412 Malignant neoplasm of upper-outer quadrant of left female breast: Secondary | ICD-10-CM | POA: Diagnosis not present

## 2016-11-07 DIAGNOSIS — S21002A Unspecified open wound of left breast, initial encounter: Secondary | ICD-10-CM | POA: Diagnosis present

## 2016-11-07 DIAGNOSIS — Z7982 Long term (current) use of aspirin: Secondary | ICD-10-CM | POA: Diagnosis not present

## 2016-11-07 MED ORDER — HYDROCODONE-ACETAMINOPHEN 5-325 MG PO TABS: 2.0000 | ORAL_TABLET | Freq: Four times a day (QID) | ORAL | 0 refills | Status: DC | PRN

## 2016-11-07 MED ORDER — HYDROCODONE-ACETAMINOPHEN 5-325 MG PO TABS: 1.0000 | ORAL_TABLET | Freq: Four times a day (QID) | ORAL | 0 refills | Status: DC | PRN

## 2016-11-07 MED ORDER — MAGNESIUM CITRATE PO SOLN: 1.0000 | Freq: Once | ORAL | 0 refills | Status: DC | PRN

## 2016-11-07 MED ORDER — SENNOSIDES-DOCUSATE SODIUM 8.6-50 MG PO TABS: 1.0000 | ORAL_TABLET | Freq: Every day | ORAL | 0 refills | Status: DC

## 2016-11-07 MED ORDER — HYDROCODONE-ACETAMINOPHEN 5-325 MG PO TABS
2.0000 | ORAL_TABLET | Freq: Once | ORAL | Status: AC
  Administered 2016-11-07: 2 via ORAL
  Filled 2016-11-07: qty 2

## 2016-11-07 NOTE — ED Triage Notes (Addendum)
Pt complaint of constipation without n/v/d or GU symptoms; pt verbalizes last BM last week; lower abdominal pain. Pt also request pain medication refill and dressing change to left breast.

## 2016-11-07 NOTE — ED Provider Notes (Addendum)
[facility] DEPT Provider Note   CSN: [id number] Arrival date & time: 11/07/16  1239     History   Chief Complaint Chief Complaint  Patient presents with  . Constipation    HPI Ann Oliver is a 61 y.o. female.   Constipation     61 year old female who presents for wound check. Although chief complaint is written as constipation, she states she is not here for management of constipation. She has history of large left fungating breast mass that was diagnosed in 2007, with metastatic breast cancer. She has follow-up with oncology on Monday to discuss potentially starting chemotherapy. Presents to ED for foreign change she states. States that she recently also ran out of her pain medication that was prescribed to her when she was discharged from the hospital earlier this month for management of her wound. Denies any fevers, chills. Has noted only mild oozing of blood from her fungating mass. Has had continued clear drainage from mass. No pus drainage or foul smelling drainage.   Past Medical History:  Diagnosis Date  . Cancer ([place])   . Sinusitis   . Tumor cells     Patient Active Problem List   Diagnosis Date Noted  . Cellulitis 10/28/2016  . Wound infection 10/28/2016  . Cellulitis of left breast 10/28/2016  . Encounter for palliative care   . Goals of care, counseling/discussion   . Breast cancer metastasized to lung, right ([place]) 09/25/2016  . Malnutrition of moderate degree 09/25/2016  . Breast cancer of upper-outer quadrant of left female breast ([place]) 10/22/2015  . THYROID NODULE, RIGHT 05/31/2007  . DEPRESSION 05/31/2007  . GERD 05/31/2007  . HOT FLASHES 05/31/2007  . HEADACHE 05/31/2007    Past Surgical History:  Procedure Laterality Date  . BIOPSY BREAST    . IR FLUORO GUIDE CV LINE RIGHT  11/06/2016  . IR US GUIDE VASC ACCESS RIGHT  11/06/2016    OB History    No data available       Home Medications    Prior to Admission medications     Medication Sig Start Date End Date Taking? Authorizing Provider  feeding supplement, ENSURE ENLIVE, (ENSURE ENLIVE) LIQD Take 237 mLs by mouth 3 (three) times daily between meals. 09/26/16  Yes [name], MD  oxyCODONE-acetaminophen (PERCOCET/ROXICET) 5-325 MG tablet Take 1-2 tablets by mouth every 3 (three) hours as needed for moderate pain. 11/02/16  Yes Theodis Blaze, MD  polyethylene glycol [facility] / Floria Raveling) packet Take 17 g by mouth daily. 11/04/16  Yes Drenda Freeze, MD  sulfamethoxazole-trimethoprim (BACTRIM DS,SEPTRA DS) 800-160 MG tablet Take 1 tablet by mouth every 12 (twelve) hours. 11/02/16  Yes Theodis Blaze, MD  traMADol (ULTRAM) 50 MG tablet Take 1 tablet (50 mg total) by mouth every 6 (six) hours as needed. 10/01/16  Yes Nicholas Lose, MD  acetaminophen (TYLENOL) 500 MG tablet Take 2 tablets (1,000 mg total) by mouth every 6 (six) hours as needed. Patient not taking: Reported on 11/06/2016 10/09/16   Charlesetta Shanks, MD  ondansetron (ZOFRAN) 8 MG tablet Take 1 tablet (8 mg total) by mouth 2 (two) times daily as needed (Nausea or vomiting). Patient not taking: Reported on 11/07/2016 10/01/16   Nicholas Lose, MD  prochlorperazine (COMPAZINE) 10 MG tablet Take 1 tablet (10 mg total) by mouth every 6 (six) hours as needed (Nausea or vomiting). Patient not taking: Reported on 10/14/2016 10/01/16   Nicholas Lose, MD    Family History Family History  Problem Relation Age of Onset  . Breast cancer Neg Hx     Social History Social History  Substance Use Topics  . Smoking status: Never Smoker  . Smokeless tobacco: Never Used  . Alcohol use No     Allergies   Aspirin   Review of Systems Review of Systems  Constitutional: Negative for fever.  Respiratory: Negative for shortness of breath.   Cardiovascular: Positive for chest pain (chest wall pain).  Gastrointestinal: Positive for constipation.  Skin: Positive for wound.  Psychiatric/Behavioral: Negative for  confusion.  All other systems reviewed and are negative.    Physical Exam Updated Vital Signs BP 104/78 (BP Location: Left Arm)   Pulse (!) 104   Temp 97.5 F (36.4 C) (Oral)   Resp 18   Ht 5\' 1"  (1.549 m)   Wt 51.3 kg (113 lb)   SpO2 100%   BMI 21.35 kg/m   Physical Exam Physical Exam  Nursing note and vitals reviewed. Constitutional: Cachectic, chronically ill appearing, non-toxic, and in no acute distress Head: Normocephalic and atraumatic.  Mouth/Throat: Oropharynx is clear and moist.  Neck: Normal range of motion. Neck supple.  Cardiovascular: Normal rate and regular rhythm.   Pulmonary/Chest: Effort normal and breath sounds normal. Large fungating mass over lateral aspect of the left breast. There is small punctate area of oozing of blood, and sanguinous drainage from another punctate area in the anterior portion of mass. No foul smelling drainage, purulent drainage, or surrounding cellulitis Abdominal: Soft. There is no tenderness. There is no rebound and no guarding.  Musculoskeletal: Normal range of motion.  Neurological: Alert, no facial droop, fluent speech, moves all extremities symmetrically Skin: Skin is warm and dry.  Psychiatric: Cooperative   ED Treatments / Results  Labs (all labs ordered are listed, but only abnormal results are displayed) Labs Reviewed  LIPASE, BLOOD  COMPREHENSIVE METABOLIC PANEL  CBC  URINALYSIS, ROUTINE W REFLEX MICROSCOPIC    EKG  EKG Interpretation None       Radiology Ir Fluoro Guide Cv Line Right  Result Date: 11/06/2016 INDICATION: Metastatic breast cancer; central venous access requested for planned chemotherapy. EXAM: RIGHT UPPER EXTREMITY PICC LINE PLACEMENT WITH ULTRASOUND AND FLUOROSCOPIC GUIDANCE MEDICATIONS: 1% lidocaine to skin and subcutaneous tissue ANESTHESIA/SEDATION: None FLUOROSCOPY TIME:  Fluoroscopy Time:  42 seconds COMPLICATIONS: None immediate. PROCEDURE: The patient was advised of the possible risks  and complications and agreed to undergo the procedure. The patient was then brought to the angiographic suite for the procedure. The right arm was prepped with chlorhexidine, draped in the usual sterile fashion using maximum barrier technique (cap and mask, sterile gown, sterile gloves, large sterile sheet, hand hygiene and cutaneous antisepsis) and infiltrated locally with 1% Lidocaine. Ultrasound demonstrated patency of the right basilic vein, and this was documented with an image. Under real-time ultrasound guidance, this vein was accessed with a 21 gauge micropuncture needle and image documentation was performed. A 0.018 wire was introduced in to the vein. Over this, a 5 [place] single lumen power injectable PICC was advanced to the lower SVC/right atrial junction. Fluoroscopy during the procedure and fluoro spot radiograph confirms appropriate catheter position. The catheter was flushed and covered with a sterile dressing. Catheter length: 39 cm IMPRESSION: Successful right arm power PICC line placement with ultrasound and fluoroscopic guidance. The catheter is ready for use. Read by: Rowe Robert, PA-C Electronically Signed   By: Sandi Mariscal M.D.   On: 11/06/2016 13:48   Ir US Guide Vasc Access  Right  Result Date: 11/06/2016 INDICATION: Metastatic breast cancer; central venous access requested for planned chemotherapy. EXAM: RIGHT UPPER EXTREMITY PICC LINE PLACEMENT WITH ULTRASOUND AND FLUOROSCOPIC GUIDANCE MEDICATIONS: 1% lidocaine to skin and subcutaneous tissue ANESTHESIA/SEDATION: None FLUOROSCOPY TIME:  Fluoroscopy Time:  42 seconds COMPLICATIONS: None immediate. PROCEDURE: The patient was advised of the possible risks and complications and agreed to undergo the procedure. The patient was then brought to the angiographic suite for the procedure. The right arm was prepped with chlorhexidine, draped in the usual sterile fashion using maximum barrier technique (cap and mask, sterile gown, sterile gloves,  large sterile sheet, hand hygiene and cutaneous antisepsis) and infiltrated locally with 1% Lidocaine. Ultrasound demonstrated patency of the right basilic vein, and this was documented with an image. Under real-time ultrasound guidance, this vein was accessed with a 21 gauge micropuncture needle and image documentation was performed. A 0.018 wire was introduced in to the vein. Over this, a 5 [place] single lumen power injectable PICC was advanced to the lower SVC/right atrial junction. Fluoroscopy during the procedure and fluoro spot radiograph confirms appropriate catheter position. The catheter was flushed and covered with a sterile dressing. Catheter length: 39 cm IMPRESSION: Successful right arm power PICC line placement with ultrasound and fluoroscopic guidance. The catheter is ready for use. Read by: Rowe Robert, PA-C Electronically Signed   By: Sandi Mariscal M.D.   On: 11/06/2016 13:48    Procedures Procedures (including critical care time)  Medications Ordered in ED Medications  HYDROcodone-acetaminophen (NORCO/VICODIN) 5-325 MG per tablet 2 tablet (2 tablets Oral Given 11/07/16 1649)     Initial Impression / Assessment and Plan / ED Course  I have reviewed the triage vital signs and the nursing notes.  Pertinent labs & imaging results that were available during my care of the patient were reviewed by me and considered in my medical decision making (see chart for details).     61 year old female who presents for wound change of her large left fungating breast mass. No evidence of infection. Chronically ill, but no acute illness. She has oncology appointment on Monday.  Dressing change performed and wound cleaned. Nonadherent sterile dressing covered by dry gauze and ABD. She has been constipated since starting narcotic medications in the hospital. Has only been taking single dose of MiraLAX. She refuses rectal exam and refuses enema. Her abdomen is soft and benign. CT imaging reviewed from  June 6, showing stool burden, possible liver metastases, but no other acute processes. Additional bowel regimen is applied. Discussed strict return and follow-up instructions.  Final Clinical Impressions(s) / ED Diagnoses   Final diagnoses:  Breast mass  Encounter for wound care    New Prescriptions New Prescriptions   No medications on file     Forde Dandy, MD 11/07/16 1654    Forde Dandy, MD 11/07/16 [phone]

## 2016-11-07 NOTE — Discharge Instructions (Signed)
Please see your cancer doctor as scheduled on Monday for further wound care  Please return without fail for worsening symptoms, including fever, intractable vomiting, severe abdominal pain, or any other symptoms concerning to you  You should be taking anti-constipation medications while you are taking her pain medications. Please increase your MiraLAX to 2 times daily, and take additional medications as prescribed.

## 2016-11-07 NOTE — ED Notes (Signed)
Pt refused blood work. Pt stated " I didn't come up here for no blood work. I know what my problem is."

## 2016-11-09 ENCOUNTER — Ambulatory Visit: Payer: Medicaid Other

## 2016-11-09 ENCOUNTER — Encounter: Payer: Self-pay | Admitting: *Deleted

## 2016-11-09 ENCOUNTER — Other Ambulatory Visit: Payer: Medicaid Other

## 2016-11-09 NOTE — Progress Notes (Signed)
Patient had an appointment for lab and infusion per Jan, RN patient was a no show. However PICC was placed 11/06/16, and will need a dressing change today. Several attempts made to contact patient, and unable to leave a voice message.

## 2016-11-10 ENCOUNTER — Telehealth: Payer: Self-pay

## 2016-11-10 NOTE — Telephone Encounter (Signed)
Pt called b/c she missed appt 6/18. She had lost her phone. Her new phone number is [phone].  She was supposed to have lab and chemo. 1st time taxol. She also needs PICC dressing change.   She is asking for hydrocodone refill. She received rx on 6/16 for #16 from ED.   S/w Wilber Bihari and passed report to [employer]. Ailene Ravel states she would follow up on this.

## 2016-11-11 ENCOUNTER — Other Ambulatory Visit: Payer: Self-pay | Admitting: *Deleted

## 2016-11-11 ENCOUNTER — Encounter: Payer: Self-pay | Admitting: *Deleted

## 2016-11-11 ENCOUNTER — Ambulatory Visit (HOSPITAL_BASED_OUTPATIENT_CLINIC_OR_DEPARTMENT_OTHER): Payer: Medicaid Other | Admitting: Adult Health

## 2016-11-11 ENCOUNTER — Encounter: Payer: Self-pay | Admitting: Adult Health

## 2016-11-11 ENCOUNTER — Ambulatory Visit (HOSPITAL_BASED_OUTPATIENT_CLINIC_OR_DEPARTMENT_OTHER): Payer: Medicaid Other

## 2016-11-11 DIAGNOSIS — Z170 Estrogen receptor positive status [ER+]: Secondary | ICD-10-CM | POA: Diagnosis not present

## 2016-11-11 DIAGNOSIS — N611 Abscess of the breast and nipple: Secondary | ICD-10-CM

## 2016-11-11 DIAGNOSIS — C7800 Secondary malignant neoplasm of unspecified lung: Secondary | ICD-10-CM

## 2016-11-11 DIAGNOSIS — Z452 Encounter for adjustment and management of vascular access device: Secondary | ICD-10-CM

## 2016-11-11 DIAGNOSIS — C50412 Malignant neoplasm of upper-outer quadrant of left female breast: Secondary | ICD-10-CM

## 2016-11-11 DIAGNOSIS — D242 Benign neoplasm of left breast: Secondary | ICD-10-CM

## 2016-11-11 DIAGNOSIS — C773 Secondary and unspecified malignant neoplasm of axilla and upper limb lymph nodes: Secondary | ICD-10-CM | POA: Diagnosis not present

## 2016-11-11 MED ORDER — OXYCODONE HCL 5 MG PO TABS: 5.0000 mg | ORAL_TABLET | ORAL | 0 refills | Status: DC | PRN

## 2016-11-11 MED ORDER — SENNOSIDES-DOCUSATE SODIUM 8.6-50 MG PO TABS: 2.0000 | ORAL_TABLET | Freq: Two times a day (BID) | ORAL | 0 refills | Status: DC

## 2016-11-11 MED ORDER — SODIUM CHLORIDE 0.9% FLUSH
10.0000 mL | INTRAVENOUS | Status: DC | PRN
  Administered 2016-11-11: 10 mL via INTRAVENOUS
  Filled 2016-11-11: qty 10

## 2016-11-11 MED ORDER — HEPARIN SOD (PORK) LOCK FLUSH 100 UNIT/ML IV SOLN
500.0000 [IU] | Freq: Once | INTRAVENOUS | Status: AC
  Administered 2016-11-11: 500 [IU] via INTRAVENOUS
  Filled 2016-11-11: qty 5

## 2016-11-11 MED FILL — oxyCODONE HCL 5 MG TABS: 5 | 5 days supply | Qty: 30 | Fill #0

## 2016-11-11 NOTE — Patient Instructions (Signed)
PICC Home Guide °A peripherally inserted central catheter (PICC) is a long, thin, flexible tube that is inserted into a vein in the upper arm. It is a form of intravenous (IV) access. It is considered to be a "central" line because the tip of the PICC ends in a large vein in your chest. This large vein is called the superior vena cava (SVC). The PICC tip ends in the SVC because there is a lot of blood flow in the SVC. This allows medicines and IV fluids to be quickly distributed throughout the body. The PICC is inserted using a sterile technique by a specially trained nurse or physician. After the PICC is inserted, a chest X-ray exam is done to be sure it is in the correct place. °A PICC may be placed for different reasons, such as: °· To give medicines and liquid nutrition that can only be given through a central line. Examples are: °? Certain antibiotic treatments. °? Chemotherapy. °? Total parenteral nutrition (TPN). °· To take frequent blood samples. °· To give IV fluids and blood products. °· If there is difficulty placing a peripheral intravenous (PIV) catheter. ° °If taken care of properly, a PICC can remain in place for several months. A PICC can also allow a person to go home from the hospital early. Medicine and PICC care can be managed at home by a family member or home health care team. °What problems can happen when I have a PICC? °Problems with a PICC can occasionally occur. These may include the following: °· A blood clot (thrombus) forming in or at the tip of the PICC. This can cause the PICC to become clogged. A clot-dissolving medicine called tissue plasminogen activator (tPA) can be given through the PICC to help break up the clot. °· Inflammation of the vein (phlebitis) in which the PICC is placed. Signs of inflammation may include redness, pain at the insertion site, red streaks, or being able to feel a "cord" in the vein where the PICC is located. °· Infection in the PICC or at the insertion  site. Signs of infection may include fever, chills, redness, swelling, or pus drainage from the PICC insertion site. °· PICC movement (malposition). The PICC tip may move from its original position due to excessive physical activity, forceful coughing, sneezing, or vomiting. °· A break or cut in the PICC. It is important to not use scissors near the PICC. °· Nerve or tendon irritation or injury during PICC insertion. ° °What should I keep in mind about activities when I have a PICC? °· You may bend your arm and move it freely. If your PICC is near or at the bend of your elbow, avoid activity with repeated motion at the elbow. °· Rest at home for the remainder of the day following PICC line insertion. °· Avoid lifting heavy objects as instructed by your health care provider. °· Avoid using a crutch with the arm on the same side as your PICC. You may need to use a walker. °What should I know about my PICC dressing? °· Keep your PICC bandage (dressing) clean and dry to prevent infection. °? Ask your health care provider when you may shower. Ask your health care provider to teach you how to wrap the PICC when you do take a shower. °· Change the PICC dressing as instructed by your health care provider. °· Change your PICC dressing if it becomes loose or wet. °What should I know about PICC care? °· Check the PICC insertion   site daily for leakage, redness, swelling, or pain. °· Do not take a bath, swim, or use hot tubs when you have a PICC. Cover PICC line with clear plastic wrap and tape to keep it dry while showering. °· Flush the PICC as directed by your health care provider. Let your health care provider know right away if the PICC is difficult to flush or does not flush. Do not use force to flush the PICC. °· Do not use a syringe that is less than 10 mL to flush the PICC. °· Never pull or tug on the PICC. °· Avoid blood pressure checks on the arm with the PICC. °· Keep your PICC identification card with you at all  times. °· Do not take the PICC out yourself. Only a trained clinical professional should remove the PICC. °Get help right away if: °· Your PICC is accidentally pulled all the way out. If this happens, cover the insertion site with a bandage or gauze dressing. Do not throw the PICC away. Your health care provider will need to inspect it. °· Your PICC was tugged or pulled and has partially come out. Do not  push the PICC back in. °· There is any type of drainage, redness, or swelling where the PICC enters the skin. °· You cannot flush the PICC, it is difficult to flush, or the PICC leaks around the insertion site when it is flushed. °· You hear a "flushing" sound when the PICC is flushed. °· You have pain, discomfort, or numbness in your arm, shoulder, or jaw on the same side as the PICC. °· You feel your heart "racing" or skipping beats. °· You notice a hole or tear in the PICC. °· You develop chills or a fever. °This information is not intended to replace advice given to you by your health care provider. Make sure you discuss any questions you have with your health care provider. °Document Released: 11/15/2002 Document Revised: 11/29/2015 Document Reviewed: 03/03/2013 °Elsevier Interactive Patient Education © 2017 [employer] ° °

## 2016-11-11 NOTE — Assessment & Plan Note (Addendum)
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  Plan: 1. Palliative treatment with Taxol every 3 weeks   Patient will need the following 1. PET/CT scan 2. MRI brain 3. Chemotherapy with Taxol to start in 2 weeks 4. IR for port placement 5. Chemotherapy class --------------------------------------------------------------------------------------------------------------------------------  This patient has a lot of social support needs.  Loren Racer our social worker met with patient for a long time today.  We will get her dressing changed today.  She will undergo PICC dressing change.  It is clear she needs PET scan and MRI done ASAP and we will try to get that to happen.  I ordered Oxycodone for her to take for her pain.  I counseled her to take one tablet every 4 hours if needed for pain.  Patient repeated these instructions back to me in front of Jaci Carrel RN.  I am very concerned about her cognitive ability, and am hopeful this MRI will be done ASAP.

## 2016-11-11 NOTE — Progress Notes (Signed)
[facility] Comprehensive Psychosocial Assessment Clinical Social Work  Clinical Social Work was referred by [employer] and need for CSW follow up.  Clinical Social Worker met with patient in the exam to assess psychosocial, emotional, mental health, and spiritual needs of the patient. CSW introduced self, explained role of CSW/Pt and Family Support Team, support groups and other resources to assist. Pt is alone today as she does not want her family to know about her cancer. She reports family brought her today.   Patient's knowledge about cancer and its treatment including level of understanding, reactions, goals for care, and expectations: Pt appears somewhat open to discussing her emotions today related to her cancer diagnosis. She still appears to have a limited understanding and CSW wonders if there is an diagnosed cognitive concern. Pt appeared to get anxious at times and then would stutter a long time, getting fixated on one topic. CSW provided education on common emotions when facing cancer and also provided education on Support Team.   Characteristics of the patient's support system: Pt reports to have family that is local and several sons. She states they are supportive, but she "is strong and doesn't want others to know. I am wanting peace and quiet while I deal with this". CSW attempted to explore with pt how to discuss her cancer treatment with her family. She continues to not want them involved and states she is "strong". Pt receives $750 in SSI check and food stamps. She reports she does not want assistance with housing as she is "on the list" somewhere, but would not disclose the location.   Patient and family psychosocial functioning including strengths, limitations, and coping skills: Pt does not want her family involved, but they appear to be supportive and are bringing her to appointments at times. Pt reports to live with her son. Pt appears to have limited insight into her cancer  diagnosis, but is now open to "doing what is needed and getting it over with". CSW feels pt will need additional support, education and strong guidance to assist her make informed medical decisions.   Identifications of barriers to care: Pt is transient and has lived at various extended stay motels in the local area for several years. She would not disclose an address today and would [place] CSW inquired again of address in order to set up SCAT and pt declined to provide address. Per previous note from inpt CSW, son looks up rates at motels daily and they move to the cheapest location. CSW explained to pt that they have to know where to pick her up in order to get services. Her constant moving resulted in termination of [facility] services.   Availability of community resources: CSW educated pt about the multiple resources available to breast cancer patients. She would qualify for the [employer], Cancer Care, and other as needed. She also has access to transportation through medicaid, possibly SCAT with disclosure of address and ACS. She was interested in these additional resources and will consider. CSW will refer to the financial counselor.  Clinical Social Worker follow up needed: Yes.    If yes, follow up plan: Follow for support, connection with resources and other possible needs.  Loren Racer, LCSW, OSW-C Clinical Social Worker [place]  [place] Phone: [phone] Fax: [phone]

## 2016-11-11 NOTE — Progress Notes (Signed)
[place] [facility] Follow up:    Center, [facility] [address] [place] [address]   DIAGNOSIS: Cancer Staging No matching staging information was found for the patient.  SUMMARY OF ONCOLOGIC HISTORY:   Breast cancer of upper-outer quadrant of left female breast ([place])   09/26/2015 Mammogram    Left breast 2:00 position suspicious mass 3.1 x 1.8 x 2.8 cm, indeterminate group of calcifications UIQ left breast needs stereotactic biopsy      10/22/2015 Initial Diagnosis    Left breast biopsy UOQ: Invasive adenocarcinoma, grade 2-3, EF 20%, PR 0%, Ki-67 30%, left breast UIQ: Fibroadenoma with extensive calcifications       Miscellaneous    Patient decided not to get any interventions and was lost to follow-up      05/06/2016 Imaging    CT chest: Heterogeneous necrotic mass left chest wall and UOQ 6.2 cm extending into axilla, ext into pectoralis major and to skin multiple enlarged left axillary LN, multiple more than 15 lung nodules, 1.5 cm lesion right lobe of the liver      09/25/2016 - 09/27/2016 Hospital Admission    Breast abscess (fungating tumor) 12.5 cm with axillary LN mets, mets to lungs       CURRENT THERAPY: needs chemotherapy  INTERVAL HISTORY: Ann Oliver 61 y.o. female returns for evaluation of her metastatic breast cancer.  She is feeling well today.  She has missed several chemotherapy appointments, education appointment and scan appointments.  She has a left breast/chest wall fungating mass and needs dressing changed and to get some pain medication.  I spoke with patient in detail.  She lives in [place], but will not give out her address.  She tells me that she has 5 sons, but will not tell me their names, or who her next of kin is.  She tells me she can read, but has a 9th grade education.  She is taking pain medications, and when I asked her what times she was taking the pain meds, she responded with telling me, "with  breakfast, at lunch time, at 2, then at 5, and then maybe at 7."  She does not have transportation.  She says she is working on getting an apartment.  She does not have a driver's license.     Patient Active Problem List   Diagnosis Date Noted  . Cellulitis 10/28/2016  . Wound infection 10/28/2016  . Cellulitis of left breast 10/28/2016  . Encounter for palliative care   . Goals of care, counseling/discussion   . 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 Malnutrition of moderate degree 09/25/2016  . Breast cancer of upper-outer quadrant of left female breast ([place]) 10/22/2015  . THYROID NODULE, RIGHT 05/31/2007  . DEPRESSION 05/31/2007  . GERD 05/31/2007  . HOT FLASHES 05/31/2007  . HEADACHE 05/31/2007    is allergic to aspirin.  MEDICAL HISTORY: Past Medical History:  Diagnosis Date  . Cancer ([place])   . Sinusitis   . Tumor cells     SURGICAL HISTORY: Past Surgical History:  Procedure Laterality Date  . BIOPSY BREAST    . IR FLUORO GUIDE CV LINE RIGHT  11/06/2016  . IR US GUIDE VASC ACCESS RIGHT  11/06/2016    SOCIAL HISTORY: Social History   Social History  . Marital status: Single    Spouse name: N/A  . Number of children: N/A  . Years of education: N/A   Occupational History  . Not  on file.   Social History Main Topics  . Smoking status: Never Smoker  . Smokeless tobacco: Never Used  . Alcohol use No  . Drug use: No  . Sexual activity: Not on file   Other Topics Concern  . Not on file   Social History Narrative  . No narrative on file    FAMILY HISTORY: Family History  Problem Relation Age of Onset  . Breast cancer Neg Hx     Review of Systems  Constitutional: Negative for appetite change, chills, fatigue, fever and unexpected weight change.  HENT:   Negative for hearing loss and lump/mass.   Eyes: Negative for eye problems and icterus.  Respiratory: Negative for chest tightness, cough and shortness of breath.    Cardiovascular: Negative for chest pain, leg swelling and palpitations.  Gastrointestinal: Negative for abdominal distention and abdominal pain.  Endocrine: Negative for hot flashes.  Genitourinary: Negative for difficulty urinating and dyspareunia.   Musculoskeletal: Negative for arthralgias.  Skin: Negative for itching and rash.  Neurological: Negative for dizziness, extremity weakness and headaches.  Hematological: Negative for adenopathy. Does not bruise/bleed easily.  Psychiatric/Behavioral: Negative for depression. The patient is not nervous/anxious.       PHYSICAL EXAMINATION  ECOG PERFORMANCE STATUS: 1 - Symptomatic but completely ambulatory  Vitals:   11/11/16 1150  BP: (!) 113/58  Pulse: 94  Resp: 18  Temp: 98.1 F (36.7 C)    Physical Exam  Constitutional: She is oriented to person, place, and time and well-developed, well-nourished, and in no distress.  HENT:  Head: Normocephalic and atraumatic.  Mouth/Throat: Oropharynx is clear and moist. No oropharyngeal exudate.  Eyes: Pupils are equal, round, and reactive to light. No scleral icterus.  Neck: Neck supple.  Cardiovascular: Normal rate, regular rhythm and normal heart sounds.   Pulmonary/Chest: Effort normal and breath sounds normal.  Left chest wall with fungating tumor protruding from chest wall  Abdominal: Soft. Bowel sounds are normal. She exhibits no distension. There is no tenderness.  Musculoskeletal: She exhibits no edema.  Lymphadenopathy:    She has no cervical adenopathy.  Neurological: She is alert and oriented to person, place, and time.  Skin: Skin is warm and dry. No rash noted.  Psychiatric: Mood and affect normal.    LABORATORY DATA:  CBC    Component Value Date/Time   WBC 11.1 (H) 11/04/2016 1040   RBC 3.50 (L) 11/04/2016 1040   HGB 9.2 (L) 11/04/2016 1040   HCT 28.7 (L) 11/04/2016 1040   PLT 463 (H) 11/04/2016 1040   MCV 82.0 11/04/2016 1040   MCH 26.3 11/04/2016 1040   MCHC  32.1 11/04/2016 1040   RDW 13.8 11/04/2016 1040   LYMPHSABS 0.7 11/04/2016 1040   MONOABS 0.7 11/04/2016 1040   EOSABS 0.2 11/04/2016 1040   BASOSABS 0.0 11/04/2016 1040    CMP     Component Value Date/Time   NA 136 11/04/2016 1040   K 4.2 11/04/2016 1040   CL 101 11/04/2016 1040   CO2 26 11/04/2016 1040   GLUCOSE 123 (H) 11/04/2016 1040   BUN 16 11/04/2016 1040   CREATININE 0.98 11/04/2016 1040   CALCIUM 9.3 11/04/2016 1040   PROT 7.3 11/04/2016 1040   ALBUMIN 3.6 11/04/2016 1040   AST 77 (H) 11/04/2016 1040   ALT 67 (H) 11/04/2016 1040   ALKPHOS 106 11/04/2016 1040   BILITOT 0.4 11/04/2016 1040   GFRNONAA >60 11/04/2016 1040   GFRAA >60 11/04/2016 1040  ASSESSMENT and PLAN:   Breast cancer of upper-outer quadrant of left female breast ([place]) 09/26/2015 Left breast 2:00 position suspicious mass 3.1 x 1.8 x 2.8 cm, indeterminate group of calcifications UIQ left breast needs stereotactic biopsy Left breast biopsy UOQ 10/22/2015: Invasive adenocarcinoma, grade 2-3, EF 20%, PR 0%, Ki-67 30%, left breast UIQ: Fibroadenoma with extensive calcifications Clinical stage in May 2017: T2 N0 stage II a Clinical stage 05/08/2016: Stage IV  Hospitalization 5/5-5/7: Breast abscess Patient was lost to follow up Refused treatment previously opting for homeopathic treatments.  Plan: 1. Palliative treatment with Taxol every 3 weeks   Patient will need the following 1. PET/CT scan 2. MRI brain 3. Chemotherapy with Taxol to start in 2 weeks 4. IR for port placement 5. Chemotherapy class --------------------------------------------------------------------------------------------------------------------------------  This patient has a lot of social support needs.  Loren Racer our social worker met with patient for a long time today.  We will get her dressing changed today.  She will undergo PICC dressing change.  It is clear she needs PET scan and MRI done ASAP and we will try to  get that to happen.  I ordered Oxycodone for her to take for her pain.  I counseled her to take one tablet every 4 hours if needed for pain.  Patient repeated these instructions back to me in front of Jaci Carrel RN.  I am very concerned about her cognitive ability, and am hopeful this MRI will be done ASAP.       All questions were answered. The patient knows to call the clinic with any problems, questions or concerns. We can certainly see the patient much sooner if necessary.  A total of (30) minutes of face-to-face time was spent with this patient with greater than 50% of that time in counseling and care-coordination.  This note was electronically signed. Scot Dock, NP 11/11/2016

## 2016-11-16 ENCOUNTER — Other Ambulatory Visit: Payer: Self-pay

## 2016-11-17 ENCOUNTER — Ambulatory Visit: Payer: Medicaid Other

## 2016-11-17 ENCOUNTER — Other Ambulatory Visit: Payer: Self-pay | Admitting: Hematology and Oncology

## 2016-11-17 ENCOUNTER — Other Ambulatory Visit: Payer: Self-pay

## 2016-11-17 ENCOUNTER — Encounter: Payer: Self-pay | Admitting: *Deleted

## 2016-11-17 DIAGNOSIS — C50412 Malignant neoplasm of upper-outer quadrant of left female breast: Secondary | ICD-10-CM

## 2016-11-17 DIAGNOSIS — Z170 Estrogen receptor positive status [ER+]: Principal | ICD-10-CM

## 2016-11-17 MED ORDER — OXYCODONE HCL 5 MG PO TABS: 5.0000 mg | ORAL_TABLET | ORAL | 0 refills | Status: DC | PRN

## 2016-11-17 NOTE — Assessment & Plan Note (Signed)
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  Plan: 1. Palliative treatment with Taxol every 3 weeks -------------------------------------------------------------------- Current treatment: Cycle 1 day 1 Taxol Chemotherapy education was completed Consent has been obtained Labs were reviewed Antiemetics reviewed  Return to clinic in one week for toxicity check

## 2016-11-17 NOTE — Progress Notes (Signed)
Pt walked in the cancer center today thinking that her picc line needs changed. Checked picc line and looks clean/dry/intact. Had good blood return and flushes well. Pt to have picc line dressing changed tomorrow prior to chemo. Pt scheduled for chemo class today since she ahd missed her appts multiple times before. Spoke with Aleta (chemo ed nurse) and she will be spending one on one with pt today in chemo class. Pt requested to have her Left wound changed. Changed dressing using wet/dry dressing and applying nonstick gauze around scabbed areas. Used 4x4 dressing to reinforce abd pad and used mepilex tape to secure dressing. Mild bleeding on top of left tumor wound. Reinforced with 4x4's. Pt appreciative of time to change her dressing. Pt confirmed that she will be here tomorrow at 930am. For lab/fl/md/infusion.

## 2016-11-18 ENCOUNTER — Ambulatory Visit: Payer: Medicaid Other

## 2016-11-18 ENCOUNTER — Telehealth: Payer: Self-pay | Admitting: Hematology and Oncology

## 2016-11-18 ENCOUNTER — Other Ambulatory Visit (HOSPITAL_BASED_OUTPATIENT_CLINIC_OR_DEPARTMENT_OTHER): Payer: Medicaid Other

## 2016-11-18 ENCOUNTER — Encounter: Payer: Self-pay | Admitting: General Practice

## 2016-11-18 ENCOUNTER — Ambulatory Visit (HOSPITAL_BASED_OUTPATIENT_CLINIC_OR_DEPARTMENT_OTHER): Payer: Medicaid Other

## 2016-11-18 ENCOUNTER — Encounter: Payer: Self-pay | Admitting: *Deleted

## 2016-11-18 ENCOUNTER — Encounter: Payer: Self-pay | Admitting: Hematology and Oncology

## 2016-11-18 ENCOUNTER — Ambulatory Visit (HOSPITAL_BASED_OUTPATIENT_CLINIC_OR_DEPARTMENT_OTHER): Payer: Medicaid Other | Admitting: Hematology and Oncology

## 2016-11-18 VITALS — BP 137/72 | HR 87 | Temp 97.6°F | Resp 16

## 2016-11-18 DIAGNOSIS — C7800 Secondary malignant neoplasm of unspecified lung: Principal | ICD-10-CM

## 2016-11-18 DIAGNOSIS — C50412 Malignant neoplasm of upper-outer quadrant of left female breast: Secondary | ICD-10-CM

## 2016-11-18 DIAGNOSIS — Z170 Estrogen receptor positive status [ER+]: Principal | ICD-10-CM

## 2016-11-18 DIAGNOSIS — Z5111 Encounter for antineoplastic chemotherapy: Secondary | ICD-10-CM

## 2016-11-18 DIAGNOSIS — N611 Abscess of the breast and nipple: Secondary | ICD-10-CM | POA: Diagnosis not present

## 2016-11-18 DIAGNOSIS — C50911 Malignant neoplasm of unspecified site of right female breast: Secondary | ICD-10-CM

## 2016-11-18 LAB — CBC WITH DIFFERENTIAL/PLATELET
BASO%: 0.2 % (ref 0.0–2.0)
Basophils Absolute: 0 10*3/uL (ref 0.0–0.1)
EOS%: 1.4 % (ref 0.0–7.0)
Eosinophils Absolute: 0.2 10*3/uL (ref 0.0–0.5)
HCT: 27.4 % — ABNORMAL LOW (ref 34.8–46.6)
HGB: 8.7 g/dL — ABNORMAL LOW (ref 11.6–15.9)
LYMPH%: 8.2 % — AB (ref 14.0–49.7)
MCH: 26.6 pg (ref 25.1–34.0)
MCHC: 31.8 g/dL (ref 31.5–36.0)
MCV: 83.8 fL (ref 79.5–101.0)
MONO#: 0.9 10*3/uL (ref 0.1–0.9)
MONO%: 8.4 % (ref 0.0–14.0)
NEUT%: 81.8 % — AB (ref 38.4–76.8)
NEUTROS ABS: 9 10*3/uL — AB (ref 1.5–6.5)
Platelets: 415 10*3/uL — ABNORMAL HIGH (ref 145–400)
RBC: 3.27 10*6/uL — AB (ref 3.70–5.45)
RDW: 14.2 % (ref 11.2–14.5)
WBC: 11 10*3/uL — ABNORMAL HIGH (ref 3.9–10.3)
lymph#: 0.9 10*3/uL (ref 0.9–3.3)

## 2016-11-18 LAB — COMPREHENSIVE METABOLIC PANEL
ALT: 31 U/L (ref 0–55)
ANION GAP: 11 meq/L (ref 3–11)
AST: 29 U/L (ref 5–34)
Albumin: 3.1 g/dL — ABNORMAL LOW (ref 3.5–5.0)
Alkaline Phosphatase: 110 U/L (ref 40–150)
BILIRUBIN TOTAL: 0.23 mg/dL (ref 0.20–1.20)
BUN: 11.2 mg/dL (ref 7.0–26.0)
CHLORIDE: 107 meq/L (ref 98–109)
CO2: 26 meq/L (ref 22–29)
CREATININE: 0.8 mg/dL (ref 0.6–1.1)
Calcium: 9.5 mg/dL (ref 8.4–10.4)
EGFR: >90 mL/min/{1.73_m2} (ref >90)
GLUCOSE: 101 mg/dL (ref 70–140)
Potassium: 3.9 mEq/L (ref 3.5–5.1)
SODIUM: 145 meq/L (ref 136–145)
TOTAL PROTEIN: 6.9 g/dL (ref 6.4–8.3)

## 2016-11-18 MED ORDER — SODIUM CHLORIDE 0.9% FLUSH
10.0000 mL | INTRAVENOUS | Status: DC | PRN
  Administered 2016-11-18: 10 mL
  Filled 2016-11-18: qty 10

## 2016-11-18 MED ORDER — SODIUM CHLORIDE 0.9% FLUSH
10.0000 mL | Freq: Once | INTRAVENOUS | Status: AC
  Administered 2016-11-18: 10 mL via INTRAVENOUS
  Filled 2016-11-18: qty 10

## 2016-11-18 MED ORDER — SODIUM CHLORIDE 0.9 % IV SOLN
Freq: Once | INTRAVENOUS | Status: AC
  Administered 2016-11-18: 12:00:00 via INTRAVENOUS

## 2016-11-18 MED ORDER — HEPARIN SOD (PORK) LOCK FLUSH 100 UNIT/ML IV SOLN
250.0000 [IU] | Freq: Once | INTRAVENOUS | Status: AC | PRN
  Administered 2016-11-18: 250 [IU]
  Filled 2016-11-18: qty 5

## 2016-11-18 MED ORDER — PACLITAXEL CHEMO INJECTION 300 MG/50ML
175.0000 mg/m2 | Freq: Once | INTRAVENOUS | Status: AC
  Administered 2016-11-18: 270 mg via INTRAVENOUS
  Filled 2016-11-18: qty 45

## 2016-11-18 MED ORDER — DIPHENHYDRAMINE HCL 50 MG/ML IJ SOLN
INTRAMUSCULAR | Status: AC
  Filled 2016-11-18: qty 1

## 2016-11-18 MED ORDER — FAMOTIDINE IN NACL 20-0.9 MG/50ML-% IV SOLN
20.0000 mg | Freq: Once | INTRAVENOUS | Status: AC
  Administered 2016-11-18: 20 mg via INTRAVENOUS

## 2016-11-18 MED ORDER — DEXAMETHASONE SODIUM PHOSPHATE 10 MG/ML IJ SOLN
10.0000 mg | Freq: Once | INTRAMUSCULAR | Status: AC
  Administered 2016-11-18: 10 mg via INTRAVENOUS

## 2016-11-18 MED ORDER — DEXAMETHASONE SODIUM PHOSPHATE 10 MG/ML IJ SOLN
INTRAMUSCULAR | Status: AC
  Filled 2016-11-18: qty 1

## 2016-11-18 MED ORDER — DIPHENHYDRAMINE HCL 50 MG/ML IJ SOLN
50.0000 mg | Freq: Once | INTRAMUSCULAR | Status: AC
  Administered 2016-11-18: 50 mg via INTRAVENOUS

## 2016-11-18 MED ORDER — FAMOTIDINE IN NACL 20-0.9 MG/50ML-% IV SOLN
INTRAVENOUS | Status: AC
  Filled 2016-11-18: qty 50

## 2016-11-18 NOTE — Patient Instructions (Addendum)
[facility] Discharge Instructions for Patients Receiving Chemotherapy  Today you received the following chemotherapy agents: Taxol   To help prevent nausea and vomiting after your treatment, we encourage you to take your nausea medication as directed.    If you develop nausea and vomiting that is not controlled by your nausea medication, call the clinic.   BELOW ARE SYMPTOMS THAT SHOULD BE REPORTED IMMEDIATELY:  *FEVER GREATER THAN 100.5 F  *CHILLS WITH OR WITHOUT FEVER  NAUSEA AND VOMITING THAT IS NOT CONTROLLED WITH YOUR NAUSEA MEDICATION  *UNUSUAL SHORTNESS OF BREATH  *UNUSUAL BRUISING OR BLEEDING  TENDERNESS IN MOUTH AND THROAT WITH OR WITHOUT PRESENCE OF ULCERS  *URINARY PROBLEMS  *BOWEL PROBLEMS  UNUSUAL RASH Items with * indicate a potential emergency and should be followed up as soon as possible.  Feel free to call the clinic you have any questions or concerns. The clinic phone number is [phone].  Please show the CHEMO ALERT CARD at check-in to the Emergency Department and triage nurse.  Paclitaxel injection What is this medicine? PACLITAXEL (PAK li TAX el) is a chemotherapy drug. It targets fast dividing cells, like cancer cells, and causes these cells to die. This medicine is used to treat ovarian cancer, breast cancer, and other cancers. This medicine may be used for other purposes; ask your health care provider or pharmacist if you have questions. COMMON BRAND NAME(S): Onxol, Taxol What should I tell my health care provider before I take this medicine? They need to know if you have any of these conditions: -blood disorders -irregular heartbeat -infection (especially a virus infection such as chickenpox, cold sores, or herpes) -liver disease -previous or ongoing radiation therapy -an unusual or allergic reaction to paclitaxel, alcohol, polyoxyethylated castor oil, other chemotherapy agents, other medicines, foods, dyes, or  preservatives -pregnant or trying to get pregnant -breast-feeding How should I use this medicine? This drug is given as an infusion into a vein. It is administered in a hospital or clinic by a specially trained health care professional. Talk to your pediatrician regarding the use of this medicine in children. Special care may be needed. Overdosage: If you think you have taken too much of this medicine contact a poison control center or emergency room at once. NOTE: This medicine is only for you. Do not share this medicine with others. What if I miss a dose? It is important not to miss your dose. Call your doctor or health care professional if you are unable to keep an appointment. What may interact with this medicine? Do not take this medicine with any of the following medications: -disulfiram -metronidazole This medicine may also interact with the following medications: -cyclosporine -diazepam -ketoconazole -medicines to increase blood counts like filgrastim, pegfilgrastim, sargramostim -other chemotherapy drugs like cisplatin, doxorubicin, epirubicin, etoposide, teniposide, vincristine -quinidine -testosterone -vaccines -verapamil Talk to your doctor or health care professional before taking any of these medicines: -acetaminophen -aspirin -ibuprofen -ketoprofen -naproxen This list may not describe all possible interactions. Give your health care provider a list of all the medicines, herbs, non-prescription drugs, or dietary supplements you use. Also tell them if you smoke, drink alcohol, or use illegal drugs. Some items may interact with your medicine. What should I watch for while using this medicine? Your condition will be monitored carefully while you are receiving this medicine. You will need important blood work done while you are taking this medicine. This medicine can cause serious allergic reactions. To reduce your risk you will need to   take other medicine(s) before  treatment with this medicine. If you experience allergic reactions like skin rash, itching or hives, swelling of the face, lips, or tongue, tell your doctor or health care professional right away. In some cases, you may be given additional medicines to help with side effects. Follow all directions for their use. This drug may make you feel generally unwell. This is not uncommon, as chemotherapy can affect healthy cells as well as cancer cells. Report any side effects. Continue your course of treatment even though you feel ill unless your doctor tells you to stop. Call your doctor or health care professional for advice if you get a fever, chills or sore throat, or other symptoms of a cold or flu. Do not treat yourself. This drug decreases your body's ability to fight infections. Try to avoid being around people who are sick. This medicine may increase your risk to bruise or bleed. Call your doctor or health care professional if you notice any unusual bleeding. Be careful brushing and flossing your teeth or using a toothpick because you may get an infection or bleed more easily. If you have any dental work done, tell your dentist you are receiving this medicine. Avoid taking products that contain aspirin, acetaminophen, ibuprofen, naproxen, or ketoprofen unless instructed by your doctor. These medicines may hide a fever. Do not become pregnant while taking this medicine. Women should inform their doctor if they wish to become pregnant or think they might be pregnant. There is a potential for serious side effects to an unborn child. Talk to your health care professional or pharmacist for more information. Do not breast-feed an infant while taking this medicine. Men are advised not to father a child while receiving this medicine. This product may contain alcohol. Ask your pharmacist or healthcare provider if this medicine contains alcohol. Be sure to tell all healthcare providers you are taking this medicine.  Certain medicines, like metronidazole and disulfiram, can cause an unpleasant reaction when taken with alcohol. The reaction includes flushing, headache, nausea, vomiting, sweating, and increased thirst. The reaction can last from 30 minutes to several hours. What side effects may I notice from receiving this medicine? Side effects that you should report to your doctor or health care professional as soon as possible: -allergic reactions like skin rash, itching or hives, swelling of the face, lips, or tongue -low blood counts - This drug may decrease the number of white blood cells, red blood cells and platelets. You may be at increased risk for infections and bleeding. -signs of infection - fever or chills, cough, sore throat, pain or difficulty passing urine -signs of decreased platelets or bleeding - bruising, pinpoint red spots on the skin, black, tarry stools, nosebleeds -signs of decreased red blood cells - unusually weak or tired, fainting spells, lightheadedness -breathing problems -chest pain -high or low blood pressure -mouth sores -nausea and vomiting -pain, swelling, redness or irritation at the injection site -pain, tingling, numbness in the hands or feet -slow or irregular heartbeat -swelling of the ankle, feet, hands Side effects that usually do not require medical attention (report to your doctor or health care professional if they continue or are bothersome): -bone pain -complete hair loss including hair on your head, underarms, pubic hair, eyebrows, and eyelashes -changes in the color of fingernails -diarrhea -loosening of the fingernails -loss of appetite -muscle or joint pain -red flush to skin -sweating This list may not describe all possible side effects. Call your doctor for medical advice about   side effects. You may report side effects to FDA at 1-800-FDA-1088. Where should I keep my medicine? This drug is given in a hospital or clinic and will not be stored at  home. NOTE: This sheet is a summary. It may not cover all possible information. If you have questions about this medicine, talk to your doctor, pharmacist, or health care provider.  2018 [employer]vier/Gold Standard (2015-03-12 19:58:00)    

## 2016-11-18 NOTE — Progress Notes (Signed)
Patient Care Team: [facility], [facility] as PCP - General  DIAGNOSIS:  Encounter Diagnosis  Name Primary?  . Malignant neoplasm of upper-outer quadrant of left breast in female, estrogen receptor positive ([place])     SUMMARY OF ONCOLOGIC HISTORY:   Breast cancer of upper-outer quadrant of left female breast ([place])   09/26/2015 Mammogram    Left breast 2:00 position suspicious mass 3.1 x 1.8 x 2.8 cm, indeterminate group of calcifications UIQ left breast needs stereotactic biopsy      10/22/2015 Initial Diagnosis    Left breast biopsy UOQ: Invasive adenocarcinoma, grade 2-3, EF 20%, PR 0%, Ki-67 30%, left breast UIQ: Fibroadenoma with extensive calcifications       Miscellaneous    Patient decided not to get any interventions and was lost to follow-up      05/06/2016 Imaging    CT chest: Heterogeneous necrotic mass left chest wall and UOQ 6.2 cm extending into axilla, ext into pectoralis major and to skin multiple enlarged left axillary LN, multiple more than 15 lung nodules, 1.5 cm lesion right lobe of the liver      09/25/2016 - 09/27/2016 Hospital Admission    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 She has delayed her treatment or a very long period of time and has finally come around to accepting chemotherapy. She continues to have pain discomfort in the chest wall mass. She has been taking pain medications which are causing her constipation.  REVIEW OF SYSTEMS:   Constitutional: Denies fevers, chills or abnormal weight loss Eyes:  Denies blurriness of vision Ears, nose, mouth, throat, and face: Denies mucositis or sore throat Respiratory: Denies cough, dyspnea or wheezes Cardiovascular: Denies palpitation, chest discomfort Gastrointestinal:  Denies nausea, heartburn or change in bowel habits Skin: Denies abnormal skin rashes Lymphatics: Denies new lymphadenopathy or easy bruising Neurological:Denies numbness, tingling or new weaknesses Behavioral/Psych: Agitated Extremities: No lower extremity edema Breast: Fungating tumor left chest wall All other systems were reviewed with the patient and are negative.  I have reviewed the past medical history, past surgical history, social history and family history with the patient and they are unchanged from previous note.  ALLERGIES:  is allergic to aspirin.  MEDICATIONS:  Current Outpatient Prescriptions  Medication Sig Dispense Refill  . feeding supplement, ENSURE ENLIVE, (ENSURE ENLIVE) LIQD Take 237 mLs by mouth 3 (three) times daily between meals. 30 Bottle 0  . magnesium citrate SOLN Take 296 mLs (1 Bottle total) by mouth once as needed for severe constipation. 195 mL 0  . ondansetron (ZOFRAN) 8 MG tablet Take 1 tablet (8 mg total) by mouth 2 (two) times daily as needed (Nausea or vomiting). (Patient not taking: Reported on 11/07/2016) 30 tablet 1  . oxyCODONE (OXY IR/ROXICODONE) 5 MG immediate release tablet Take 1 tablet (5 mg total) by mouth every 4 (four) hours as needed for severe pain. 30 tablet 0  . polyethylene glycol (MIRALAX / GLYCOLAX) packet Take 17 g by mouth daily. 14 each 0  . prochlorperazine (COMPAZINE)  10 MG tablet Take 1 tablet (10 mg total) by mouth every 6 (six) hours as needed (Nausea or vomiting). (Patient not taking: Reported on 10/14/2016) 30 tablet 1  . senna-docusate (SENOKOT-S) 8.6-50 MG tablet Take 2 tablets by mouth 2 (two) times daily. 120 tablet 0  . sulfamethoxazole-trimethoprim (BACTRIM DS,SEPTRA DS) 800-160 MG tablet Take 1 tablet by  mouth every 12 (twelve) hours. 10 tablet 0   No current facility-administered medications for this visit.     PHYSICAL EXAMINATION: ECOG PERFORMANCE STATUS: 1 - Symptomatic but completely ambulatory  Vitals:   11/18/16 1037  BP: (!) 145/60  Pulse: 94  Resp: 18  Temp: 97.5 F (36.4 C)   Filed Weights   11/18/16 1037  Weight: 117 lb 6.4 oz (53.3 kg)    GENERAL:alert, no distress and comfortable SKIN: skin color, texture, turgor are normal, no rashes or significant lesions EYES: normal, Conjunctiva are pink and non-injected, sclera clear OROPHARYNX:no exudate, no erythema and lips, buccal mucosa, and tongue normal  NECK: supple, thyroid normal size, non-tender, without nodularity LYMPH:  no palpable lymphadenopathy in the cervical, axillary or inguinal LUNGS: clear to auscultation and percussion with normal breathing effort HEART: regular rate & rhythm and no murmurs and no lower extremity edema ABDOMEN:abdomen soft, non-tender and normal bowel sounds MUSCULOSKELETAL:no cyanosis of digits and no clubbing  NEURO: alert & oriented x 3 with fluent speech, no focal motor/sensory deficits EXTREMITIES: No lower extremity edema   LABORATORY DATA:  I have reviewed the data as listed   Chemistry      Component Value Date/Time   NA 145 11/18/2016 1004   K 3.9 11/18/2016 1004   CL 101 11/04/2016 1040   CO2 26 11/18/2016 1004   BUN 11.2 11/18/2016 1004   CREATININE 0.8 11/18/2016 1004      Component Value Date/Time   CALCIUM 9.5 11/18/2016 1004   ALKPHOS 110 11/18/2016 1004   AST 29 11/18/2016 1004   ALT 31 11/18/2016 1004   BILITOT 0.23 11/18/2016 1004       Lab Results  Component Value Date   WBC 11.0 (H) 11/18/2016   HGB 8.7 (L) 11/18/2016   HCT 27.4 (L) 11/18/2016   MCV 83.8 11/18/2016   PLT 415 (H) 11/18/2016   NEUTROABS 9.0 (H) 11/18/2016    ASSESSMENT & PLAN:  Breast cancer of upper-outer quadrant of left female breast ([place]) 09/26/2015 Left breast 2:00  position suspicious mass 3.1 x 1.8 x 2.8 cm, indeterminate group of calcifications UIQ left breast needs stereotactic biopsy Left breast biopsy UOQ 10/22/2015: Invasive adenocarcinoma, grade 2-3, EF 20%, PR 0%, Ki-67 30%, left breast UIQ: Fibroadenoma with extensive calcifications Clinical stage in May 2017: T2 N0 stage II a Clinical stage 05/08/2016: Stage IV  Hospitalization 5/5-5/7: Breast abscess Patient was lost to follow up Refused treatment previously opting for homeopathic treatments.  Plan: 1. Palliative treatment with Taxol every 3 weeks -------------------------------------------------------------------- Current treatment: Cycle 1 day 1 Taxol Chemotherapy education was completed Consent has been obtained Labs were reviewed Antiemetics reviewed  Return to clinic in one week for toxicity check   I spent 25 minutes talking to the patient of which more than half was spent in counseling and coordination of care.  No orders of the defined types were placed in this encounter.  The patient has a good understanding of the overall plan. she agrees with it. she will call with any problems that may develop before the next visit here.   Rulon Eisenmenger, MD 11/18/16

## 2016-11-18 NOTE — Progress Notes (Signed)
Pt reports that a family member dropped her off for her chemo appt today and that the same family member is picking her up after her appt this evening.

## 2016-11-18 NOTE — Patient Instructions (Signed)
PICC Home Guide °A peripherally inserted central catheter (PICC) is a long, thin, flexible tube that is inserted into a vein in the upper arm. It is a form of intravenous (IV) access. It is considered to be a "central" line because the tip of the PICC ends in a large vein in your chest. This large vein is called the superior vena cava (SVC). The PICC tip ends in the SVC because there is a lot of blood flow in the SVC. This allows medicines and IV fluids to be quickly distributed throughout the body. The PICC is inserted using a sterile technique by a specially trained nurse or physician. After the PICC is inserted, a chest X-ray exam is done to be sure it is in the correct place. °A PICC may be placed for different reasons, such as: °· To give medicines and liquid nutrition that can only be given through a central line. Examples are: °? Certain antibiotic treatments. °? Chemotherapy. °? Total parenteral nutrition (TPN). °· To take frequent blood samples. °· To give IV fluids and blood products. °· If there is difficulty placing a peripheral intravenous (PIV) catheter. ° °If taken care of properly, a PICC can remain in place for several months. A PICC can also allow a person to go home from the hospital early. Medicine and PICC care can be managed at home by a family member or home health care team. °What problems can happen when I have a PICC? °Problems with a PICC can occasionally occur. These may include the following: °· A blood clot (thrombus) forming in or at the tip of the PICC. This can cause the PICC to become clogged. A clot-dissolving medicine called tissue plasminogen activator (tPA) can be given through the PICC to help break up the clot. °· Inflammation of the vein (phlebitis) in which the PICC is placed. Signs of inflammation may include redness, pain at the insertion site, red streaks, or being able to feel a "cord" in the vein where the PICC is located. °· Infection in the PICC or at the insertion  site. Signs of infection may include fever, chills, redness, swelling, or pus drainage from the PICC insertion site. °· PICC movement (malposition). The PICC tip may move from its original position due to excessive physical activity, forceful coughing, sneezing, or vomiting. °· A break or cut in the PICC. It is important to not use scissors near the PICC. °· Nerve or tendon irritation or injury during PICC insertion. ° °What should I keep in mind about activities when I have a PICC? °· You may bend your arm and move it freely. If your PICC is near or at the bend of your elbow, avoid activity with repeated motion at the elbow. °· Rest at home for the remainder of the day following PICC line insertion. °· Avoid lifting heavy objects as instructed by your health care provider. °· Avoid using a crutch with the arm on the same side as your PICC. You may need to use a walker. °What should I know about my PICC dressing? °· Keep your PICC bandage (dressing) clean and dry to prevent infection. °? Ask your health care provider when you may shower. Ask your health care provider to teach you how to wrap the PICC when you do take a shower. °· Change the PICC dressing as instructed by your health care provider. °· Change your PICC dressing if it becomes loose or wet. °What should I know about PICC care? °· Check the PICC insertion   site daily for leakage, redness, swelling, or pain. °· Do not take a bath, swim, or use hot tubs when you have a PICC. Cover PICC line with clear plastic wrap and tape to keep it dry while showering. °· Flush the PICC as directed by your health care provider. Let your health care provider know right away if the PICC is difficult to flush or does not flush. Do not use force to flush the PICC. °· Do not use a syringe that is less than 10 mL to flush the PICC. °· Never pull or tug on the PICC. °· Avoid blood pressure checks on the arm with the PICC. °· Keep your PICC identification card with you at all  times. °· Do not take the PICC out yourself. Only a trained clinical professional should remove the PICC. °Get help right away if: °· Your PICC is accidentally pulled all the way out. If this happens, cover the insertion site with a bandage or gauze dressing. Do not throw the PICC away. Your health care provider will need to inspect it. °· Your PICC was tugged or pulled and has partially come out. Do not  push the PICC back in. °· There is any type of drainage, redness, or swelling where the PICC enters the skin. °· You cannot flush the PICC, it is difficult to flush, or the PICC leaks around the insertion site when it is flushed. °· You hear a "flushing" sound when the PICC is flushed. °· You have pain, discomfort, or numbness in your arm, shoulder, or jaw on the same side as the PICC. °· You feel your heart "racing" or skipping beats. °· You notice a hole or tear in the PICC. °· You develop chills or a fever. °This information is not intended to replace advice given to you by your health care provider. Make sure you discuss any questions you have with your health care provider. °Document Released: 11/15/2002 Document Revised: 11/29/2015 Document Reviewed: 03/03/2013 °Elsevier Interactive Patient Education © 2017 [employer] ° °

## 2016-11-18 NOTE — Progress Notes (Signed)
[facility] Spiritual Care Note  Referred by Barrington Ellison and May Armel/RN for spiritual, social, and emotional support.  I share the concerns noted (by Loren Racer, Wilber Bihari) about cognitive assessment, as [place] spoke in a halting, repetitive, circuitous manner that made tracking difficult.   Per pt, her faith is very important to her.  She stated several times that she knows "God didn't bring me this far to leave me now," and that "God has brought me through so much."  She takes pride in her independence for ADLs, emphasizing that she has "never stayed in the bed, but always gotten around" and "always gotten to the restroom by myself."  Per pt, she has five sons and "I was counting it up the other day, I have seven grands, I think that's right, I was telling them, I said I had 6 or 7, I said have 7 or 8 grands, I think it's seven."  She stated that she lives with her youngest son and declined to share his name.  I am unclear whether she wanted to keep than information private or whether she may have had trouble recalling his name.  Provided pastoral presence, reflective listening, prayer shawl as tangible sign of care and support, and my card with encouragement to call anytime.  Will follow with [place] Team as needed/desired.  Please always page if immediate needs arise.  Thank you.   [place], [place], [facility] Pager [phone] Voicemail [phone]

## 2016-11-18 NOTE — Telephone Encounter (Signed)
lvm for central radiology scheduling to sch pt PET scan per 6/27 sch msg. Left pt information and desk RN contact information if any questions

## 2016-11-18 NOTE — Progress Notes (Signed)
[place] Work  Clinical Social Work was referred by nurse for assessment of psychosocial needs.  Clinical Social Worker met with patient at [facility] to briefly check in at first chemo to offer support and assess for needs.  Pt was asleep and CSW had a very brief interaction today. Pt did recall she had met with CSW the week prior and had mentioned interest in Ensure supplements. CSW will make referral to dietician today. Pt denied other needs at this time. CSW team to follow.   Clinical Social Work interventions:  Check in Resource and referral  Loren Racer, LCSW, OSW-C Clinical Social Worker Home  [facility] Phone: [phone] Fax: [phone]

## 2016-11-19 ENCOUNTER — Other Ambulatory Visit: Payer: Self-pay

## 2016-11-19 ENCOUNTER — Telehealth: Payer: Self-pay

## 2016-11-19 DIAGNOSIS — Z170 Estrogen receptor positive status [ER+]: Principal | ICD-10-CM

## 2016-11-19 DIAGNOSIS — C50412 Malignant neoplasm of upper-outer quadrant of left female breast: Secondary | ICD-10-CM

## 2016-11-19 NOTE — Progress Notes (Signed)
Called the hyperbaric wound center for [facility] to set pt up for wound care dressing changes next week. LVM with call back number with nurse line. Pending appt at this time.

## 2016-11-19 NOTE — Telephone Encounter (Signed)
Called and LVM to follow up after her chemo yesterday. Pt had 1st time taxol. Tolerated well yesterday. Left VM with call back number.

## 2016-11-20 ENCOUNTER — Other Ambulatory Visit (HOSPITAL_BASED_OUTPATIENT_CLINIC_OR_DEPARTMENT_OTHER): Payer: Medicaid Other

## 2016-11-20 DIAGNOSIS — Z452 Encounter for adjustment and management of vascular access device: Secondary | ICD-10-CM | POA: Diagnosis present

## 2016-11-20 DIAGNOSIS — Z95828 Presence of other vascular implants and grafts: Secondary | ICD-10-CM

## 2016-11-20 DIAGNOSIS — C50412 Malignant neoplasm of upper-outer quadrant of left female breast: Secondary | ICD-10-CM

## 2016-11-20 MED ORDER — SODIUM CHLORIDE 0.9% FLUSH
10.0000 mL | INTRAVENOUS | Status: DC | PRN
  Administered 2016-11-20: 10 mL via INTRAVENOUS
  Filled 2016-11-20: qty 10

## 2016-11-20 MED ORDER — HEPARIN SOD (PORK) LOCK FLUSH 100 UNIT/ML IV SOLN
500.0000 [IU] | Freq: Once | INTRAVENOUS | Status: AC
  Administered 2016-11-20: 500 [IU] via INTRAVENOUS
  Filled 2016-11-20: qty 5

## 2016-11-20 NOTE — Progress Notes (Unsigned)
Pt came as walk in and wanted to have her picc line flushed. Flushed with saline and heparin locked. Great blood return and flushes well.R PICC line dressing clean/dry/intact. Instructed pt to go to the ED this weekend to get her wound dressing changed. Pt verbalized understanding. Pt aware of schedule next Monday for picc line flush.

## 2016-11-21 ENCOUNTER — Encounter (HOSPITAL_COMMUNITY): Payer: Self-pay | Admitting: Emergency Medicine

## 2016-11-21 ENCOUNTER — Observation Stay (HOSPITAL_COMMUNITY)
Admission: EM | Admit: 2016-11-21 | Discharge: 2016-11-22 | Disposition: A | Discharge status: Home / Self Care | Payer: Medicaid Other | Attending: Family Medicine | Admitting: Family Medicine

## 2016-11-21 DIAGNOSIS — Z4800 Encounter for change or removal of nonsurgical wound dressing: Secondary | ICD-10-CM | POA: Insufficient documentation

## 2016-11-21 DIAGNOSIS — M7989 Other specified soft tissue disorders: Secondary | ICD-10-CM | POA: Diagnosis not present

## 2016-11-21 DIAGNOSIS — C50412 Malignant neoplasm of upper-outer quadrant of left female breast: Secondary | ICD-10-CM | POA: Diagnosis not present

## 2016-11-21 DIAGNOSIS — Z79899 Other long term (current) drug therapy: Secondary | ICD-10-CM | POA: Diagnosis not present

## 2016-11-21 DIAGNOSIS — Z170 Estrogen receptor positive status [ER+]: Secondary | ICD-10-CM | POA: Diagnosis not present

## 2016-11-21 DIAGNOSIS — C50912 Malignant neoplasm of unspecified site of left female breast: Secondary | ICD-10-CM | POA: Diagnosis present

## 2016-11-21 DIAGNOSIS — T148XXA Other injury of unspecified body region, initial encounter: Secondary | ICD-10-CM

## 2016-11-21 DIAGNOSIS — N61 Mastitis without abscess: Secondary | ICD-10-CM | POA: Diagnosis present

## 2016-11-21 DIAGNOSIS — L089 Local infection of the skin and subcutaneous tissue, unspecified: Secondary | ICD-10-CM

## 2016-11-21 LAB — BASIC METABOLIC PANEL
Anion gap: 7 (ref 5–15)
BUN: 13 mg/dL (ref 6–20)
CHLORIDE: 104 mmol/L (ref 101–111)
CO2: 28 mmol/L (ref 22–32)
CREATININE: 0.64 mg/dL (ref 0.44–1.00)
Calcium: 8.9 mg/dL (ref 8.9–10.3)
GFR calc Af Amer: >60 mL/min (ref >60)
GFR calc non Af Amer: >60 mL/min (ref >60)
GLUCOSE: 99 mg/dL (ref 65–99)
POTASSIUM: 3.8 mmol/L (ref 3.5–5.1)
SODIUM: 139 mmol/L (ref 135–145)

## 2016-11-21 LAB — CBC WITH DIFFERENTIAL/PLATELET
Basophils Absolute: 0 10*3/uL (ref 0.0–0.1)
Basophils Relative: 0 %
EOS ABS: 0.3 10*3/uL (ref 0.0–0.7)
EOS PCT: 3 %
HCT: 28.2 % — ABNORMAL LOW (ref 36.0–46.0)
Hemoglobin: 9.1 g/dL — ABNORMAL LOW (ref 12.0–15.0)
LYMPHS ABS: 0.5 10*3/uL — AB (ref 0.7–4.0)
LYMPHS PCT: 6 %
MCH: 26.5 pg (ref 26.0–34.0)
MCHC: 32.3 g/dL (ref 30.0–36.0)
MCV: 82 fL (ref 78.0–100.0)
MONO ABS: 0.2 10*3/uL (ref 0.1–1.0)
MONOS PCT: 3 %
Neutro Abs: 7.5 10*3/uL (ref 1.7–7.7)
Neutrophils Relative %: 88 %
PLATELETS: 361 10*3/uL (ref 150–400)
RBC: 3.44 MIL/uL — ABNORMAL LOW (ref 3.87–5.11)
RDW: 14.2 % (ref 11.5–15.5)
WBC: 8.5 10*3/uL (ref 4.0–10.5)

## 2016-11-21 LAB — I-STAT BETA HCG BLOOD, ED (MC, WL, AP ONLY)

## 2016-11-21 LAB — I-STAT CG4 LACTIC ACID, ED: LACTIC ACID, VENOUS: 0.78 mmol/L (ref 0.5–1.9)

## 2016-11-21 LAB — TSH: TSH: 1.295 u[IU]/mL (ref 0.350–4.500)

## 2016-11-21 LAB — MAGNESIUM: Magnesium: 2 mg/dL (ref 1.7–2.4)

## 2016-11-21 LAB — PHOSPHORUS: PHOSPHORUS: 3.1 mg/dL (ref 2.5–4.6)

## 2016-11-21 MED ORDER — BISACODYL 5 MG PO TBEC: 5.0000 mg | DELAYED_RELEASE_TABLET | Freq: Every day | ORAL | Status: DC | PRN

## 2016-11-21 MED ORDER — ONDANSETRON HCL 4 MG/2ML IJ SOLN
4.0000 mg | Freq: Four times a day (QID) | INTRAMUSCULAR | Status: DC | PRN
  Administered 2016-11-22: 4 mg via INTRAVENOUS
  Filled 2016-11-21: qty 2

## 2016-11-21 MED ORDER — SODIUM CHLORIDE 0.9% FLUSH
3.0000 mL | Freq: Two times a day (BID) | INTRAVENOUS | Status: DC
  Administered 2016-11-21 – 2016-11-22 (×2): 3 mL via INTRAVENOUS

## 2016-11-21 MED ORDER — SODIUM CHLORIDE 0.9% FLUSH: 3.0000 mL | INTRAVENOUS | Status: DC | PRN

## 2016-11-21 MED ORDER — OXYCODONE HCL 5 MG PO TABS
5.0000 mg | ORAL_TABLET | ORAL | Status: DC | PRN
  Administered 2016-11-21 – 2016-11-22 (×5): 5 mg via ORAL
  Filled 2016-11-21 (×6): qty 1

## 2016-11-21 MED ORDER — IBUPROFEN 200 MG PO TABS
400.0000 mg | ORAL_TABLET | Freq: Four times a day (QID) | ORAL | Status: DC | PRN
  Administered 2016-11-21 – 2016-11-22 (×2): 400 mg via ORAL
  Filled 2016-11-21 (×2): qty 2

## 2016-11-21 MED ORDER — SODIUM CHLORIDE 0.9 % IV SOLN: 250.0000 mL | INTRAVENOUS | Status: DC | PRN

## 2016-11-21 MED ORDER — FENTANYL CITRATE (PF) 100 MCG/2ML IJ SOLN: 40.0000 ug | Freq: Once | INTRAMUSCULAR | Status: DC

## 2016-11-21 MED ORDER — ONDANSETRON HCL 4 MG PO TABS
4.0000 mg | ORAL_TABLET | Freq: Four times a day (QID) | ORAL | Status: DC | PRN
  Administered 2016-11-21: 4 mg via ORAL
  Filled 2016-11-21: qty 1

## 2016-11-21 MED ORDER — VANCOMYCIN HCL IN DEXTROSE 750-5 MG/150ML-% IV SOLN
750.0000 mg | Freq: Two times a day (BID) | INTRAVENOUS | Status: DC
  Administered 2016-11-22: 750 mg via INTRAVENOUS
  Filled 2016-11-21 (×2): qty 150

## 2016-11-21 MED ORDER — PIPERACILLIN-TAZOBACTAM 3.375 G IVPB 30 MIN
3.3750 g | Freq: Once | INTRAVENOUS | Status: AC
  Administered 2016-11-21: 3.375 g via INTRAVENOUS
  Filled 2016-11-21: qty 50

## 2016-11-21 MED ORDER — VANCOMYCIN HCL IN DEXTROSE 1-5 GM/200ML-% IV SOLN
1000.0000 mg | INTRAVENOUS | Status: AC
  Administered 2016-11-21: 1000 mg via INTRAVENOUS
  Filled 2016-11-21: qty 200

## 2016-11-21 MED ORDER — PIPERACILLIN-TAZOBACTAM 3.375 G IVPB
3.3750 g | Freq: Three times a day (TID) | INTRAVENOUS | Status: DC
  Administered 2016-11-21 – 2016-11-22 (×2): 3.375 g via INTRAVENOUS
  Filled 2016-11-21 (×4): qty 50

## 2016-11-21 MED ORDER — ENSURE ENLIVE PO LIQD
237.0000 mL | Freq: Three times a day (TID) | ORAL | Status: DC
  Administered 2016-11-21 – 2016-11-22 (×2): 237 mL via ORAL

## 2016-11-21 MED ORDER — SODIUM CHLORIDE 0.9 % IV SOLN
INTRAVENOUS | Status: DC
  Administered 2016-11-21: 18:00:00 via INTRAVENOUS

## 2016-11-21 MED ORDER — ENOXAPARIN SODIUM 40 MG/0.4ML ~~LOC~~ SOLN
40.0000 mg | SUBCUTANEOUS | Status: DC
  Filled 2016-11-21: qty 0.4

## 2016-11-21 MED ORDER — TRAMADOL HCL 50 MG PO TABS
50.0000 mg | ORAL_TABLET | Freq: Every day | ORAL | Status: DC
  Administered 2016-11-22: 50 mg via ORAL
  Filled 2016-11-21: qty 1

## 2016-11-21 NOTE — Progress Notes (Signed)
Pharmacy Antibiotic Note  Ann Oliver is a 61 y.o. female admitted on 11/21/2016 with breast cancer and breast cellulitis/wound, concerning for abscess.  Pharmacy has been consulted for Vancomycin dosing.  Tm 98.1 WBC 8.5 SCr 0.64, CrCl~ 70ml/min Recently on Vanc 500mg  q12h with subtherapeutic trough of 9 (10/29/16-10/31/16)  Plan:  Vancomycin 1g IV x1 dose then 750 mg IV q12h.  Measure Vanc trough at steady state (Goal VT 15-20 while concerned for abscess)  Follow up renal fxn, culture results, and clinical course.      Temp (24hrs), Avg:98.1 F (36.7 C), Min:98.1 F (36.7 C), Max:98.1 F (36.7 C)   Recent Labs Lab 11/18/16 1004 11/18/16 1004 11/21/16 1248  WBC 11.0*  --  8.5  CREATININE  --  0.8  --     Estimated Creatinine Clearance: 55 mL/min (by C-G formula based on SCr of 0.8 mg/dL).    Allergies  Allergen Reactions  . Aspirin Palpitations    Antimicrobials this admission: 6/30 Zosyn x1 6/30 Vancomycin >>   Dose adjustments this admission:   Microbiology results: 6/30 Wound:   Thank you for allowing pharmacy to be a part of this patient's care.  Gretta Arab PharmD, BCPS Pager [phone] 11/21/2016 1:16 PM

## 2016-11-21 NOTE — Consult Note (Signed)
Reason for Consult:mass of chest wall Referring Physician: Dr. Jadene Pierini Ann Oliver is an 61 y.o. female.  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 She finally agreed to start chemotherapy and got her first dose about 3 days ago. She comes to the emergency department today with some bleeding and drainage from the mass.  Past Medical History:  Diagnosis Date  . Cancer ([place])   . Sinusitis   . Tumor cells     Past Surgical History:  Procedure Laterality Date  . BIOPSY BREAST    . IR FLUORO GUIDE CV LINE RIGHT  11/06/2016  . IR US GUIDE VASC ACCESS RIGHT  11/06/2016    Family History  Problem Relation Age of Onset  . Breast cancer Neg Hx     Social History:  reports that she has never smoked. She has never used smokeless tobacco. She reports that she does not drink alcohol or use drugs.  Allergies:  Allergies  Allergen Reactions  . Aspirin Palpitations    Medications: I have reviewed the patient's current medications.  Results for orders placed or performed during the hospital encounter of 11/21/16 (from the past 48 hour(s))  Basic metabolic panel     Status: None   Collection Time: 11/21/16 12:48 PM  Result Value Ref Range   Sodium 139 135 - 145 mmol/L   Potassium 3.8 3.5 - 5.1 mmol/L   Chloride 104 101 - 111 mmol/L   CO2 28 22 - 32 mmol/L   Glucose, Bld 99 65 - 99 mg/dL   BUN 13 6 - 20 mg/dL   Creatinine, Ser 0.64 0.44 - 1.00 mg/dL   Calcium 8.9 8.9 - 10.3 mg/dL   GFR calc non Af Amer >60 >60 mL/min   GFR calc Af Amer >60 >60 mL/min    Comment: (NOTE) The eGFR has been calculated using the CKD EPI equation. This calculation has not been validated in all clinical situations. eGFR's persistently <60 mL/min signify possible Chronic Kidney Disease.    Anion gap 7 5 - 15  CBC with Differential     Status: Abnormal   Collection Time: 11/21/16 12:48 PM  Result  Value Ref Range   WBC 8.5 4.0 - 10.5 K/uL   RBC 3.44 (L) 3.87 - 5.11 MIL/uL   Hemoglobin 9.1 (L) 12.0 - 15.0 g/dL   HCT 28.2 (L) 36.0 - 46.0 %   MCV 82.0 78.0 - 100.0 fL   MCH 26.5 26.0 - 34.0 pg   MCHC 32.3 30.0 - 36.0 g/dL   RDW 14.2 11.5 - 15.5 %   Platelets 361 150 - 400 K/uL   Neutrophils Relative % 88 %   Neutro Abs 7.5 1.7 - 7.7 K/uL   Lymphocytes Relative 6 %   Lymphs Abs 0.5 (L) 0.7 - 4.0 K/uL   Monocytes Relative 3 %   Monocytes Absolute 0.2 0.1 - 1.0 K/uL   Eosinophils Relative 3 %   Eosinophils Absolute 0.3 0.0 - 0.7 K/uL   Basophils Relative 0 %   Basophils Absolute 0.0 0.0 - 0.1 K/uL  I-Stat beta hCG blood, ED ([facility], [facility], [facility] only)     Status: None   Collection Time: 11/21/16 12:56 PM  Result Value Ref Range   I-stat hCG, quantitative <5.0 <5 mIU/mL   Comment 3            Comment:   GEST.  AGE      CONC.  (mIU/mL)   <=1 WEEK        5 - 50     2 WEEKS       50 - 500     3 WEEKS       100 - 10,000     4 WEEKS     1,000 - 30,000        FEMALE AND NON-PREGNANT FEMALE:     LESS THAN 5 mIU/mL   I-Stat CG4 Lactic Acid, ED     Status: None   Collection Time: 11/21/16  1:15 PM  Result Value Ref Range   Lactic Acid, Venous 0.78 0.5 - 1.9 mmol/L    No results found.  Review of Systems  Constitutional: Negative.   HENT: Negative.   Eyes: Negative.   Respiratory: Negative.   Cardiovascular: Negative.   Gastrointestinal: Negative.   Genitourinary: Negative.   Musculoskeletal: Negative.   Skin: Negative.   Neurological: Negative.   Endo/Heme/Allergies: Bruises/bleeds easily.  Psychiatric/Behavioral: Negative.    Blood pressure 105/88, pulse 64, temperature 98.1 F (36.7 C), temperature source Oral, resp. rate 16, SpO2 99 %. Physical Exam  Constitutional: She is oriented to person, place, and time. She appears well-developed and well-nourished. No distress.  HENT:  Head: Normocephalic and atraumatic.  Mouth/Throat: No oropharyngeal exudate.  Eyes: Conjunctivae  and EOM are normal. Pupils are equal, round, and reactive to light.  Neck: Normal range of motion. Neck supple.  Cardiovascular: Normal rate, regular rhythm and normal heart sounds.   Respiratory: Effort normal and breath sounds normal. No stridor. No respiratory distress.  There is a large fungating mass of the left lateral chest wall and axilla.  GI: Soft. Bowel sounds are normal. There is no tenderness.  Musculoskeletal: Normal range of motion. She exhibits no edema or tenderness.  Neurological: She is alert and oriented to person, place, and time. Coordination normal.  Skin: Skin is warm and dry. No rash noted.  Psychiatric: She has a normal mood and affect. Her behavior is normal. Thought content normal.    Assessment/Plan:  The patient has a large fungating stage IV cancer of the left breast. At this point the cancer seems to be unresectable and fixed to the chest wall. I would recommend an oncology consult and possibly a palliative care consult. Please call us if we can be of further assistance.  TOTH III,Ketih Goodie S 11/21/2016, 3:06 PM

## 2016-11-21 NOTE — ED Provider Notes (Signed)
[facility] DEPT Provider Note   By signing my name below, I, Bea Graff, attest that this documentation has been prepared under the direction and in the presence of Wyn Quaker, PA-C. Electronically Signed: Bea Graff, ED Scribe. 11/21/16. 12:22 PM.    History   Chief Complaint Chief Complaint  Patient presents with  . Wound Check    The history is provided by the patient and medical records. No language interpreter was used.    Ann Oliver is a 61 y.o. female with PMHx of left breast cancer who presents to the Emergency Department wanting a wound check of a left breast wound. She reports associated clear/yellow and brown drainage that began worsening yesterday and has become green. She also reports some bleeding from the wound but has not noticed any today. She states she was instructed to come here for a dressing change. She is not currently on antibiotics. There are no modifying factors noted. She denies malodorous drainage, fever, chills. She states she is currently on chemotherapy and chart review shows she had her first dose of palatine treatment this week.    Past Medical History:  Diagnosis Date  . Cancer ([place])   . Sinusitis   . Tumor cells     Patient Active Problem List   Diagnosis Date Noted  . Necrotizing soft tissue infection 11/21/2016  . Cellulitis 10/28/2016  . Wound infection 10/28/2016  . Cellulitis of left breast 10/28/2016  . Encounter for palliative care   . Goals of care, counseling/discussion   . Breast cancer metastasized to lung, right ([place]) 09/25/2016  . Malnutrition of moderate degree 09/25/2016  . Breast cancer of upper-outer quadrant of left female breast ([employer]) 10/22/2015  . THYROID NODULE, RIGHT 05/31/2007  . DEPRESSION 05/31/2007  . GERD 05/31/2007  . HOT FLASHES 05/31/2007  . HEADACHE 05/31/2007    Past Surgical History:  Procedure Laterality Date  . BIOPSY BREAST    . IR FLUORO GUIDE CV LINE RIGHT  11/06/2016    . IR US GUIDE VASC ACCESS RIGHT  11/06/2016    OB History    No data available       Home Medications    Prior to Admission medications   Medication Sig Start Date End Date Taking? Authorizing Provider  ibuprofen (ADVIL,MOTRIN) 200 MG tablet Take 400 mg by mouth every 6 (six) hours as needed.   Yes [provider]  traMADol (ULTRAM) 50 MG tablet Take 50 mg by mouth daily. 10/01/16  Yes [provider]  feeding supplement, ENSURE ENLIVE, (ENSURE ENLIVE) LIQD Take 237 mLs by mouth 3 (three) times daily between meals. Patient not taking: Reported on 11/21/2016 09/26/16   [name], MD    Family History Family History  Problem Relation Age of Onset  . Breast cancer Neg Hx     Social History Social History  Substance Use Topics  . Smoking status: Never Smoker  . Smokeless tobacco: Never Used  . Alcohol use No     Allergies   Aspirin   Review of Systems Review of Systems  Constitutional: Negative for chills and fever.  HENT: Negative for ear pain and sore throat.   Eyes: Negative for pain and visual disturbance.  Respiratory: Negative for cough and shortness of breath.   Cardiovascular: Negative for chest pain and palpitations.  Gastrointestinal: Negative for abdominal pain and vomiting.  Genitourinary: Negative for dysuria and hematuria.  Musculoskeletal: Negative for arthralgias and back pain.  Skin: Positive for wound. Negative for color change  and rash.  Neurological: Negative for seizures, syncope and headaches.  All other systems reviewed and are negative.    Physical Exam Updated Vital Signs BP 105/88   Pulse 64   Temp 98.1 F (36.7 C) (Oral)   Resp 16   SpO2 99%   Physical Exam  Constitutional: She appears well-developed and well-nourished.  HENT:  Head: Normocephalic and atraumatic.  Eyes: Conjunctivae are normal. No scleral icterus.  Neck: Normal range of motion. Neck supple.  Cardiovascular: Normal rate and normal heart  sounds.   No murmur heard. Pulmonary/Chest: Effort normal. No respiratory distress. Right breast exhibits mass (Large fungating mass with malodorous yellow/green drainage.). Breasts are asymmetrical.  Abdominal: Soft. She exhibits no distension. There is no tenderness.  Musculoskeletal: Normal range of motion.  Neurological: She is alert. No sensory deficit. She exhibits normal muscle tone.  Skin: Skin is warm and dry.  Breast mass as pictured below  Psychiatric: She has a normal mood and affect. Her behavior is normal.  Nursing note and vitals reviewed.        ED Treatments / Results  DIAGNOSTIC STUDIES: Oxygen Saturation is 99% on RA, normal by my interpretation.   COORDINATION OF CARE: 12:19 PM- Will order labs and review chart. Pt verbalizes understanding and agrees to plan.  Medications                                piperacillin-tazobactam (ZOSYN) IVPB 3.375 g (0 g Intravenous Stopped 11/21/16 1424)  vancomycin (VANCOCIN) IVPB 1000 mg/200 mL premix (0 mg Intravenous Stopped 11/21/16 1454)    Labs (all labs ordered are listed, but only abnormal results are displayed) Labs Reviewed  CBC WITH DIFFERENTIAL/PLATELET - Abnormal; Notable for the following:       Result Value   RBC 3.44 (*)    Hemoglobin 9.1 (*)    HCT 28.2 (*)    Lymphs Abs 0.5 (*)    All other components within normal limits  AEROBIC CULTURE (SUPERFICIAL SPECIMEN)  BASIC METABOLIC PANEL  MAGNESIUM  PHOSPHORUS  TSH  COMPREHENSIVE METABOLIC PANEL  CBC  I-STAT CG4 LACTIC ACID, ED  I-STAT BETA HCG BLOOD, ED ([facility], [facility], [facility] ONLY)    EKG  EKG Interpretation None       Radiology No results found.  Procedures Procedures (including critical care time)  Medications Ordered in ED Medications  piperacillin-tazobactam (ZOSYN) IVPB 3.375 g (0 g Intravenous Stopped 11/21/16 1424)  vancomycin (VANCOCIN) IVPB 1000 mg/200 mL premix (0 mg Intravenous Stopped 11/21/16 1454)     Initial  Impression / Assessment and Plan / ED Course  I have reviewed the triage vital signs and the nursing notes.  Pertinent labs & imaging results that were available during my care of the patient were reviewed by me and considered in my medical decision making (see chart for details).  Clinical Course as of Nov 21 2036  Sat Nov 21, 2016  1339 Spoke with hospitalist, he requested general surgery consult.   [EH]  1346 Spoke with general surgery, he will come see the patient.   [EH]    Clinical Course User Index [EH] Lorin Glass, PA-C   Ann Oliver presents With known left breast metastatic cancer and a large fungating mass.  She has recently started palliative chemotherapy. She reports that over the past few days the drainage from the mass has become green/yellow.  She reports that she was instructed to come here  for a wound change today, however I am concerned about the obvious infection present.  Dr. Kathrynn Humble was involved in the patients care and assisted with treatment plan.  Patient does not appear septic at this time, she is afebrile, not tachypneic, and not tachycardic.  Hospitalist was consulted for admission and accepted, requesting that Gen. surgery consult as he is concerned about abscess formation. Consult placed to general surgery who saw the patient, suggested an oncology and palliative care consult.  Patient was started on vancomycin and Zosyn to treat her infection. Wound cultures were obtained prior to the initiation of antibiotics.  The patient appears reasonably stabilized for admission considering the current resources, flow, and capabilities available in the ED at this time, and I doubt any other [facility] requiring further screening and/or treatment in the ED prior to admission.   Final Clinical Impressions(s) / ED Diagnoses   Final diagnoses:  Malignant neoplasm of upper-outer quadrant of left breast in female, estrogen receptor positive ([place])  Wound infection    New  Prescriptions Current Discharge Medication List      I personally performed the services described in this documentation, which was scribed in my presence. The recorded information has been reviewed and is accurate.     Lorin Glass, PA-C 11/21/16 2046    Varney Biles, MD 11/22/16 [phone]

## 2016-11-21 NOTE — ED Triage Notes (Signed)
Pt reports she was discharged 2 days after being hospitalized for R breast wound. Pt reports she has had increased clear drainage since this am.

## 2016-11-21 NOTE — H&P (Addendum)
History and Physical    [name] AVW:[id number] DOB: 1956-02-09 DOA: 11/21/2016  Referring MD/NP/PA:  [facility]P: [facility]  Outpatient Specialists:  Patient coming from: Home  Chief Complaint:   HPI: Ann Oliver is a 61 y.o. female with medical history significant for but not limited to    fungating left breast mass/ left breast cancer that was initially diagnosed May 2017, metastatic to lungs, having neglected to care for years. She was started on chemotherapy 3 days ago.  She presents with 2 day history of progressively worsening greenish drainage from her fungating mass with worsening pain.  She had been admitted to the hospital for cellulitis of the breast mass and treated with antibiotics about 4 weeks ago    ED Course: At emergency room patient hemodynamically stable and due to her previous history of noncompliance with treatment it was felt that the best course of action is to at least admitted for observation overnight to get evaluation by general surgery after antibiotic initiation ini  Review of Systems: As per HPI otherwise 10 point review of systems negative.  Past Medical History:  Diagnosis Date  . Cancer ([place])   . Sinusitis   . Tumor cells     Past Surgical History:  Procedure Laterality Date  . BIOPSY BREAST    . IR FLUORO GUIDE CV LINE RIGHT  11/06/2016  . IR US GUIDE VASC ACCESS RIGHT  11/06/2016     reports that she has never smoked. She has never used smokeless tobacco. She reports that she does not drink alcohol or use drugs.  Allergies  Allergen Reactions  . Aspirin Palpitations    Family History  Problem Relation Age of Onset  . Breast cancer Neg Hx     Prior to Admission medications   Medication Sig Start Date End Date Taking? Authorizing Provider  ibuprofen (ADVIL,MOTRIN) 200 MG tablet Take 400 mg by mouth every 6 (six) hours as needed.   Yes [provider]  traMADol (ULTRAM) 50 MG tablet Take 50 mg by  mouth daily. 10/01/16  Yes [provider]  feeding supplement, ENSURE ENLIVE, (ENSURE ENLIVE) LIQD Take 237 mLs by mouth 3 (three) times daily between meals. Patient not taking: Reported on 11/21/2016 09/26/16   [name], MD    Physical Exam: Vitals:   11/21/16 1030 11/21/16 1744 11/21/16 1801  BP: 105/88 (!) 144/82 124/84  Pulse: 64 89 94  Resp: 16  16  Temp: 98.1 F (36.7 C)  98.2 F (36.8 C)  TempSrc: Oral  Oral  SpO2: 99% 100% 98%  Weight:   52.6 kg (116 lb)  Height:   5\' 1"  (1.549 m)      Constitutional: NAD, calm, comfortable Vitals:   11/21/16 1030 11/21/16 1744 11/21/16 1801  BP: 105/88 (!) 144/82 124/84  Pulse: 64 89 94  Resp: 16  16  Temp: 98.1 F (36.7 C)  98.2 F (36.8 C)  TempSrc: Oral  Oral  SpO2: 99% 100% 98%  Weight:   52.6 kg (116 lb)  Height:   5\' 1"  (1.549 m)   Eyes: PERRL, lids Scleral pallor ENMT: Mucous membranes are moist. Posterior pharynx clear of any exudate or lesions  Neck: normal, supple, no masses, no thyromegaly Respiratory: clear to auscultation bilaterally, no wheezing, no crackles. Normal respiratory effort. No accessory muscle use.  Cardiovascular: Regular rate and rhythm, no murmurs / rubs / gallops.  Abdomen: no tenderness, no masses palpated. No hepatosplenomegaly. Bowel sounds positive.  Musculoskeletal: no  clubbing / cyanosis.   Skin: large fungating mass of the left lateral chest wall and axilla Neurologic: Alert and oriented x 3. Psychiatric: Normal judgment and insight.  Normal mood.    Labs on Admission: I have personally reviewed following labs and imaging studies  CBC:  Recent Labs Lab 11/18/16 1004 11/21/16 1248  WBC 11.0* 8.5  NEUTROABS 9.0* 7.5  HGB 8.7* 9.1*  HCT 27.4* 28.2*  MCV 83.8 82.0  PLT 415* 967   Basic Metabolic Panel:  Recent Labs Lab 11/18/16 1004 11/21/16 1248  NA 145 139  K 3.9 3.8  CL  --  104  CO2 26 28  GLUCOSE 101 99  BUN 11.2 13  CREATININE 0.8 0.64  CALCIUM  9.5 8.9   GFR: Estimated Creatinine Clearance: 55 mL/min (by C-G formula based on SCr of 0.64 mg/dL). Liver Function Tests:  Recent Labs Lab 11/18/16 1004  AST 29  ALT 31  ALKPHOS 110  BILITOT 0.23  PROT 6.9  ALBUMIN 3.1*   No results for input(s): LIPASE, AMYLASE in the last 168 hours. No results for input(s): AMMONIA in the last 168 hours. Coagulation Profile: No results for input(s): INR, PROTIME in the last 168 hours. Cardiac Enzymes: No results for input(s): CKTOTAL, CKMB, CKMBINDEX, TROPONINI in the last 168 hours. BNP (last 3 results) No results for input(s): PROBNP in the last 8760 hours. HbA1C: No results for input(s): HGBA1C in the last 72 hours. CBG: No results for input(s): GLUCAP in the last 168 hours. Lipid Profile: No results for input(s): CHOL, HDL, LDLCALC, TRIG, CHOLHDL, LDLDIRECT in the last 72 hours. Thyroid Function Tests: No results for input(s): TSH, T4TOTAL, FREET4, T3FREE, THYROIDAB in the last 72 hours. Anemia Panel: No results for input(s): VITAMINB12, FOLATE, FERRITIN, TIBC, IRON, RETICCTPCT in the last 72 hours. Urine analysis:    Component Value Date/Time   COLORURINE YELLOW 09/25/2016 0230   APPEARANCEUR CLEAR 09/25/2016 0230   LABSPEC 1.019 09/25/2016 0230   PHURINE 5.0 09/25/2016 0230   GLUCOSEU NEGATIVE 09/25/2016 0230   HGBUR NEGATIVE 09/25/2016 0230   BILIRUBINUR NEGATIVE 09/25/2016 0230   KETONESUR 5 (A) 09/25/2016 0230   PROTEINUR NEGATIVE 09/25/2016 0230   NITRITE NEGATIVE 09/25/2016 0230   LEUKOCYTESUR NEGATIVE 09/25/2016 0230   Sepsis Labs: @LABRCNTIP (RFFMBWGYKZLDJ:5,TSVXBLTJQZE:0) ) Recent Results (from the past 240 hour(s))  Wound or Superficial Culture     Status: None (Preliminary result)   Collection Time: 11/21/16  1:06 PM  Result Value Ref Range Status   Specimen Description WOUND LEFT BREAST  Final   Special Requests NONE  Final   Gram Stain   Final    FEW WBC PRESENT, PREDOMINANTLY PMN ABUNDANT GRAM POSITIVE  COCCI IN PAIRS ABUNDANT GRAM NEGATIVE RODS ABUNDANT GRAM NEGATIVE COCCI FEW GRAM POSITIVE RODS Performed at [facility], [place] [address] [place], [place] [address]    Culture PENDING  Incomplete   Report Status PENDING  Incomplete     Radiological Exams on Admission: No results found.  EKG: N/A  Assessment/Plan Active Problems:   Cellulitis of left breast   Necrotizing soft tissue infection   Breast cancer of upper-outer quadrant of left female breast (HCC)   #1 necrotizing soft tissue infection of left breast: Antibiotic Gen. surgical review for possible debridement  #2 fungating breast cancer: Recently started on chemotherapy Outpatient oncology follow-up Palliative consult requested -goals of care    DVT prophylaxis: (Lovenox) Code Status: (Full) Family Communication:   Disposition Plan:  (To be determined) Consults called:  General Surgery (Dr Marlou Starks) Admission status:  ( obs / Kristie Cowman MD [facility] Pager [phone]  If 7PM-7AM, please contact night-coverage [email or web address] Password [email or web address]  11/21/2016, 6:05 PM

## 2016-11-22 DIAGNOSIS — Z170 Estrogen receptor positive status [ER+]: Secondary | ICD-10-CM | POA: Diagnosis not present

## 2016-11-22 DIAGNOSIS — C50412 Malignant neoplasm of upper-outer quadrant of left female breast: Secondary | ICD-10-CM | POA: Diagnosis not present

## 2016-11-22 DIAGNOSIS — M7989 Other specified soft tissue disorders: Secondary | ICD-10-CM

## 2016-11-22 DIAGNOSIS — N61 Mastitis without abscess: Secondary | ICD-10-CM

## 2016-11-22 LAB — GLUCOSE, CAPILLARY: GLUCOSE-CAPILLARY: 105 mg/dL — AB (ref 65–99)

## 2016-11-22 MED ORDER — DOXYCYCLINE HYCLATE 100 MG PO CAPS: 100.0000 mg | ORAL_CAPSULE | Freq: Two times a day (BID) | ORAL | 0 refills | Status: DC

## 2016-11-22 MED ORDER — CEPHALEXIN 500 MG PO CAPS: 500.0000 mg | ORAL_CAPSULE | Freq: Four times a day (QID) | ORAL | 0 refills | Status: DC

## 2016-11-22 MED ORDER — HEPARIN SOD (PORK) LOCK FLUSH 100 UNIT/ML IV SOLN
500.0000 [IU] | Freq: Once | INTRAVENOUS | Status: DC
  Filled 2016-11-22: qty 5

## 2016-11-22 NOTE — Discharge Summary (Signed)
Physician Discharge Summary  Ann Oliver [id number]:[id number] DOB: August 01, 1955 DOA: 11/21/2016  PCP: [facility]  Admit date: 11/21/2016 Discharge date: 11/22/2016  Admitted From: Home Disposition: Home   Recommendations for Outpatient Follow-up:  1. Follow up with [name] 7/5 as scheduled.  Home Health: None Equipment/Devices: None Discharge Condition: Stable CODE STATUS: Full Diet recommendation: No restrictions  Brief/Interim Summary: Ann Oliver is a 61 y.o. 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 She presented to the ED 6/30 for drainage from the mass, and bleeding which has resolved. No fevers, chills, nausea or vomiting.   Discharge Diagnoses:  Active Problems:   Breast cancer of upper-outer quadrant of left female breast ([place])   Cellulitis of left breast   Necrotizing soft tissue infection  Soft tissue infection of left breast:  - No intervention per general surgery - Has tolerated broad spectrum antibiotics, has no evidence of systemic infection. Tolerating po and is able to afford antibiotics. Will discharge on keflex and doxycycline. Continue wound dressing changes.   Fungating stage IV breast cancer:  - Follow up with [name]. Pt appears in significant denial, evident by delay in presentation for treatment and in current discussions about prognosis. Would continue these discussions as able.   Discharge Instructions Discharge Instructions    Discharge instructions    Complete by:  As directed    You must fill the prescriptions for keflex and doxycycline and take these as directed for the infection.  You may also take tramadol for pain Follow up with [name] as scheduled on Thursday. If your symptoms worsen, seek medical attention right away.     Allergies as of 11/22/2016      Reactions   Aspirin Palpitations      Medication  List    TAKE these medications   cephALEXin 500 MG capsule Commonly known as:  KEFLEX Take 1 capsule (500 mg total) by mouth 4 (four) times daily.   doxycycline 100 MG capsule Commonly known as:  VIBRAMYCIN Take 1 capsule (100 mg total) by mouth 2 (two) times daily.   feeding supplement (ENSURE ENLIVE) Liqd Take 237 mLs by mouth 3 (three) times daily between meals.   ibuprofen 200 MG tablet Commonly known as:  ADVIL,MOTRIN Take 400 mg by mouth every 6 (six) hours as needed.   traMADol 50 MG tablet Commonly known as:  ULTRAM Take 50 mg by mouth daily.       Allergies  Allergen Reactions  . Aspirin Palpitations    Consultations:  General surgery, Dr. Marlou Starks  Subjective: Pain is controlled, says she feels "better than when I came in." No nausea, vomiting, diarrhea. When anything is brought up about the mass itself or palliative medicine, she shuts off the conversation, stating that she'd rather just talk with [name] about it. Says she gets a check every 1st of the month and has medicaid, can afford formulary medications.   Discharge Exam: BP 124/84 (BP Location: Left Arm)   Pulse 94   Temp 98.9 F (37.2 C) (Oral)   Resp 17   Ht 5\' 1"  (1.549 m)   Wt 52.6 kg (116 lb)   SpO2 100%   BMI 21.92 kg/m   General: Anxious-appearing but no distress Cardiovascular: RRR, no JVD or edema Respiratory: Non-labored, clear Abdominal: Soft, NT, ND, bowel sounds + Skin: Very large fungating mass on left chest wall with seropurulent discharge and mild surrounding  erythema. No fluctuance. Picture from admission below.   Labs: Basic Metabolic Panel:  Recent Labs Lab 11/18/16 1004 11/21/16 1248 11/21/16 1830  NA 145 139  --   K 3.9 3.8  --   CL  --  104  --   CO2 26 28  --   GLUCOSE 101 99  --   BUN 11.2 13  --   CREATININE 0.8 0.64  --   CALCIUM 9.5 8.9  --   MG  --   --  2.0  PHOS  --   --  3.1   Liver Function Tests:  Recent Labs Lab 11/18/16 1004  AST 29    ALT 31  ALKPHOS 110  BILITOT 0.23  PROT 6.9  ALBUMIN 3.1*   CBC:  Recent Labs Lab 11/18/16 1004 11/21/16 1248  WBC 11.0* 8.5  NEUTROABS 9.0* 7.5  HGB 8.7* 9.1*  HCT 27.4* 28.2*  MCV 83.8 82.0  PLT 415* 361   Thyroid function studies  Recent Labs  11/21/16 1830  TSH 1.295   Urinalysis    Component Value Date/Time   COLORURINE YELLOW 09/25/2016 0230   APPEARANCEUR CLEAR 09/25/2016 0230   LABSPEC 1.019 09/25/2016 0230   PHURINE 5.0 09/25/2016 0230   GLUCOSEU NEGATIVE 09/25/2016 0230   HGBUR NEGATIVE 09/25/2016 0230   BILIRUBINUR NEGATIVE 09/25/2016 0230   KETONESUR 5 (A) 09/25/2016 0230   PROTEINUR NEGATIVE 09/25/2016 0230   NITRITE NEGATIVE 09/25/2016 0230   LEUKOCYTESUR NEGATIVE 09/25/2016 0230    Microbiology Recent Results (from the past 240 hour(s))  Wound or Superficial Culture     Status: None (Preliminary result)   Collection Time: 11/21/16  1:06 PM  Result Value Ref Range Status   Specimen Description WOUND LEFT BREAST  Final   Special Requests NONE  Final   Gram Stain   Final    FEW WBC PRESENT, PREDOMINANTLY PMN ABUNDANT GRAM POSITIVE COCCI IN PAIRS ABUNDANT GRAM NEGATIVE RODS ABUNDANT GRAM NEGATIVE COCCI FEW GRAM POSITIVE RODS Performed at [facility], [address] [place], [place] [address]    Culture PENDING  Incomplete   Report Status PENDING  Incomplete    Time coordinating discharge: Approximately 40 minutes  Vance Gather, MD  [facility] 11/22/2016, 9:54 AM Pager [phone]

## 2016-11-23 ENCOUNTER — Encounter: Payer: Self-pay | Admitting: *Deleted

## 2016-11-23 ENCOUNTER — Other Ambulatory Visit: Payer: Self-pay

## 2016-11-23 ENCOUNTER — Emergency Department (HOSPITAL_COMMUNITY)
Admission: EM | Admit: 2016-11-23 | Discharge: 2016-11-23 | Disposition: A | Discharge status: Home / Self Care | Payer: Medicaid Other | Attending: Emergency Medicine | Admitting: Emergency Medicine

## 2016-11-23 ENCOUNTER — Encounter (HOSPITAL_COMMUNITY): Payer: Self-pay | Admitting: Emergency Medicine

## 2016-11-23 ENCOUNTER — Ambulatory Visit (HOSPITAL_BASED_OUTPATIENT_CLINIC_OR_DEPARTMENT_OTHER): Payer: Medicaid Other

## 2016-11-23 DIAGNOSIS — Z452 Encounter for adjustment and management of vascular access device: Secondary | ICD-10-CM | POA: Diagnosis present

## 2016-11-23 DIAGNOSIS — Z5321 Procedure and treatment not carried out due to patient leaving prior to being seen by health care provider: Secondary | ICD-10-CM | POA: Diagnosis not present

## 2016-11-23 DIAGNOSIS — Z170 Estrogen receptor positive status [ER+]: Principal | ICD-10-CM

## 2016-11-23 DIAGNOSIS — C50412 Malignant neoplasm of upper-outer quadrant of left female breast: Secondary | ICD-10-CM | POA: Diagnosis not present

## 2016-11-23 DIAGNOSIS — N611 Abscess of the breast and nipple: Secondary | ICD-10-CM | POA: Insufficient documentation

## 2016-11-23 DIAGNOSIS — N644 Mastodynia: Secondary | ICD-10-CM | POA: Diagnosis present

## 2016-11-23 LAB — AEROBIC CULTURE W GRAM STAIN (SUPERFICIAL SPECIMEN)

## 2016-11-23 LAB — AEROBIC CULTURE  (SUPERFICIAL SPECIMEN)

## 2016-11-23 MED ORDER — IBUPROFEN 200 MG PO TABS
ORAL_TABLET | ORAL | Status: AC
  Filled 2016-11-23: qty 2

## 2016-11-23 MED ORDER — IBUPROFEN 200 MG PO TABS
400.0000 mg | ORAL_TABLET | Freq: Once | ORAL | Status: AC
  Administered 2016-11-23: 400 mg via ORAL

## 2016-11-23 MED ORDER — HEPARIN SOD (PORK) LOCK FLUSH 100 UNIT/ML IV SOLN
500.0000 [IU] | Freq: Once | INTRAVENOUS | Status: AC
Start: 2016-11-23 — End: 2016-11-23
  Administered 2016-11-23: 500 [IU] via INTRAVENOUS
  Filled 2016-11-23: qty 5

## 2016-11-23 MED ORDER — SODIUM CHLORIDE 0.9% FLUSH
10.0000 mL | INTRAVENOUS | Status: DC | PRN
Start: 2016-11-23 — End: 2016-11-23
  Administered 2016-11-23: 10 mL via INTRAVENOUS
  Filled 2016-11-23: qty 10

## 2016-11-23 NOTE — Progress Notes (Signed)
[facility] Clinical Social Work  Holiday representative was called regarding transportation needs.  CSW met with patient in [facility] lobby.  Patient requested transportation assistance home, but could not recall which service she has used in the past ([employer] or Medicaid transportation).  After discussion patient stated she has used both but preferred Medicaid transportation.  CSW contacted medicaid transportation.  Patients information needed to be "updated", and she was due for re-assessment as of February.  Patients caseworker was emailed requesting new assessment.  Assessments can be completed over the phone.  CSW provided CSW and patients phone number.  CSw provided bus passes to get her home and to her next appointment.  CSw to follow up at next appointment.   Johnnye Lana, MSW, LCSW, OSW-C Clinical Social Worker [facility] [phone]

## 2016-11-23 NOTE — Patient Instructions (Signed)
PICC Home Guide °A peripherally inserted central catheter (PICC) is a long, thin, flexible tube that is inserted into a vein in the upper arm. It is a form of intravenous (IV) access. It is considered to be a "central" line because the tip of the PICC ends in a large vein in your chest. This large vein is called the superior vena cava (SVC). The PICC tip ends in the SVC because there is a lot of blood flow in the SVC. This allows medicines and IV fluids to be quickly distributed throughout the body. The PICC is inserted using a sterile technique by a specially trained nurse or physician. After the PICC is inserted, a chest X-ray exam is done to be sure it is in the correct place. °A PICC may be placed for different reasons, such as: °· To give medicines and liquid nutrition that can only be given through a central line. Examples are: °? Certain antibiotic treatments. °? Chemotherapy. °? Total parenteral nutrition (TPN). °· To take frequent blood samples. °· To give IV fluids and blood products. °· If there is difficulty placing a peripheral intravenous (PIV) catheter. ° °If taken care of properly, a PICC can remain in place for several months. A PICC can also allow a person to go home from the hospital early. Medicine and PICC care can be managed at home by a family member or home health care team. °What problems can happen when I have a PICC? °Problems with a PICC can occasionally occur. These may include the following: °· A blood clot (thrombus) forming in or at the tip of the PICC. This can cause the PICC to become clogged. A clot-dissolving medicine called tissue plasminogen activator (tPA) can be given through the PICC to help break up the clot. °· Inflammation of the vein (phlebitis) in which the PICC is placed. Signs of inflammation may include redness, pain at the insertion site, red streaks, or being able to feel a "cord" in the vein where the PICC is located. °· Infection in the PICC or at the insertion  site. Signs of infection may include fever, chills, redness, swelling, or pus drainage from the PICC insertion site. °· PICC movement (malposition). The PICC tip may move from its original position due to excessive physical activity, forceful coughing, sneezing, or vomiting. °· A break or cut in the PICC. It is important to not use scissors near the PICC. °· Nerve or tendon irritation or injury during PICC insertion. ° °What should I keep in mind about activities when I have a PICC? °· You may bend your arm and move it freely. If your PICC is near or at the bend of your elbow, avoid activity with repeated motion at the elbow. °· Rest at home for the remainder of the day following PICC line insertion. °· Avoid lifting heavy objects as instructed by your health care provider. °· Avoid using a crutch with the arm on the same side as your PICC. You may need to use a walker. °What should I know about my PICC dressing? °· Keep your PICC bandage (dressing) clean and dry to prevent infection. °? Ask your health care provider when you may shower. Ask your health care provider to teach you how to wrap the PICC when you do take a shower. °· Change the PICC dressing as instructed by your health care provider. °· Change your PICC dressing if it becomes loose or wet. °What should I know about PICC care? °· Check the PICC insertion   site daily for leakage, redness, swelling, or pain. °· Do not take a bath, swim, or use hot tubs when you have a PICC. Cover PICC line with clear plastic wrap and tape to keep it dry while showering. °· Flush the PICC as directed by your health care provider. Let your health care provider know right away if the PICC is difficult to flush or does not flush. Do not use force to flush the PICC. °· Do not use a syringe that is less than 10 mL to flush the PICC. °· Never pull or tug on the PICC. °· Avoid blood pressure checks on the arm with the PICC. °· Keep your PICC identification card with you at all  times. °· Do not take the PICC out yourself. Only a trained clinical professional should remove the PICC. °Get help right away if: °· Your PICC is accidentally pulled all the way out. If this happens, cover the insertion site with a bandage or gauze dressing. Do not throw the PICC away. Your health care provider will need to inspect it. °· Your PICC was tugged or pulled and has partially come out. Do not  push the PICC back in. °· There is any type of drainage, redness, or swelling where the PICC enters the skin. °· You cannot flush the PICC, it is difficult to flush, or the PICC leaks around the insertion site when it is flushed. °· You hear a "flushing" sound when the PICC is flushed. °· You have pain, discomfort, or numbness in your arm, shoulder, or jaw on the same side as the PICC. °· You feel your heart "racing" or skipping beats. °· You notice a hole or tear in the PICC. °· You develop chills or a fever. °This information is not intended to replace advice given to you by your health care provider. Make sure you discuss any questions you have with your health care provider. °Document Released: 11/15/2002 Document Revised: 11/29/2015 Document Reviewed: 03/03/2013 °Elsevier Interactive Patient Education © 2017 [employer] ° °

## 2016-11-23 NOTE — ED Triage Notes (Signed)
Pt complaint of worsening left breast infection with associated drainage and odor; seen here for same yesterday.

## 2016-11-23 NOTE — ED Notes (Signed)
Pt stated they didn't feel like waiting, when pt was called to go back to a room they did not answer.

## 2016-11-23 NOTE — ED Notes (Signed)
Pt verbalizes "was just here yesterday and now taking two antibiotics; I don't want any blood done; I was just in here; I just want my dressing changed because it looks more yellow."

## 2016-11-23 NOTE — Progress Notes (Signed)
Pt came in for her picc line dressing change and flush. Pt also wanted to see if she can get her dressing changed since she wasn't able to get it done in the [facility] ED. Removed Left dressing and cleaned with normal saline. Applied wet to dry 4x4 dressing and non adherent gauzes. Reinforced with dry 4x4 dressing to keep wound c/d/i. Did not see any bleeding of wounds but noticed some necrotic black tissue surrounding different areas of L breast wound. Continues to have foul malodorous smell. Asked pt if she is now settle in [place]. Pt states that she just moved to an apartment. Told pt that we can get home care to come to her home as long as she is in this current residence. Pt gave her new address [place]. Told pt that she needs to be able to answer her phone when we call to ensure that she is getting the proper communication with her wound dressings at home, once home care is set up and consulted. Pt states that she will try to make sure that she has her phone with her at all times. Asked pt what her number is. Pt gave this phone number: [phone] cel. Pt was asking if she can have ibuprofen for pain after her dressing change. She doesn't know what time she would be able to get picked up and she really needs something for pain. Obtained ibuprofen order from [place] [facility] 400mg  x 1. Pt also given some scrubs due to some bladder incontinence.

## 2016-11-26 ENCOUNTER — Ambulatory Visit: Payer: Self-pay | Admitting: Hematology and Oncology

## 2016-11-26 ENCOUNTER — Other Ambulatory Visit: Payer: Self-pay

## 2016-11-26 NOTE — Progress Notes (Deleted)
Patient Care Team: Center, [facility] as PCP - General  DIAGNOSIS:  Encounter Diagnosis  Name Primary?  . Malignant neoplasm of upper-outer quadrant of left breast in female, estrogen receptor positive ([place])     SUMMARY OF ONCOLOGIC HISTORY:   Breast cancer of upper-outer quadrant of left female breast ([place])   09/26/2015 Mammogram    Left breast 2:00 position suspicious mass 3.1 x 1.8 x 2.8 cm, indeterminate group of calcifications UIQ left breast needs stereotactic biopsy      10/22/2015 Initial Diagnosis    Left breast biopsy UOQ: Invasive adenocarcinoma, grade 2-3, EF 20%, PR 0%, Ki-67 30%, left breast UIQ: Fibroadenoma with extensive calcifications       Miscellaneous    Patient decided not to get any interventions and was lost to follow-up      05/06/2016 Imaging    CT chest: Heterogeneous necrotic mass left chest wall and UOQ 6.2 cm extending into axilla, ext into pectoralis major and to skin multiple enlarged left axillary LN, multiple more than 15 lung nodules, 1.5 cm lesion right lobe of the liver      09/25/2016 - 09/27/2016 Hospital Admission    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 Today's cycle 1 day 8 of Taxol. She tolerated Taxol chemotherapy extremely well. Her biggest problem has been related to wound dressing changes related to the fungating tumor. Our nursing staff had been working extensively to try to figure out a plan to take care of her wounds. Previously she was going to the emergency room for dressing changes. We have done several dressing changes as well. She  was set up with home health to get wound care at home however she had not responded to phone calls and was not able to be contacted. She denies any nausea vomiting.  REVIEW OF SYSTEMS:   Constitutional: Denies fevers, chills or abnormal weight loss Eyes: Denies blurriness of vision Ears, nose, mouth, throat, and face: Denies mucositis or sore throat Respiratory: Denies cough, dyspnea or wheezes Cardiovascular: Denies palpitation, chest discomfort Gastrointestinal:  Denies nausea, heartburn or change in bowel habits Skin: Denies abnormal skin rashes Lymphatics: Denies new lymphadenopathy or easy bruising Neurological:Denies numbness, tingling or new weaknesses Behavioral/Psych: Mood is stable, no new changes  Extremities: No lower extremity edema Breast: Fungating tumor left breast  All other systems were reviewed with the patient and are negative.  I have reviewed the past medical history, past surgical history, social history and family history with the patient and they are unchanged from previous note.  ALLERGIES:  is allergic to aspirin.  MEDICATIONS:  Current Outpatient Prescriptions  Medication Sig Dispense Refill  . cephALEXin (KEFLEX) 500 MG capsule Take 1 capsule (500 mg total) by mouth 4 (four) times daily. 40 capsule 0  . doxycycline (VIBRAMYCIN) 100 MG capsule Take 1 capsule (100 mg total) by mouth 2 (two) times daily. 20 capsule 0  . feeding supplement, ENSURE ENLIVE, (ENSURE ENLIVE) LIQD Take 237 mLs by mouth 3 (three) times daily between meals. (Patient not taking: Reported on 11/21/2016) 30 Bottle 0  . ibuprofen (  ADVIL,MOTRIN) 200 MG tablet Take 400 mg by mouth every 6 (six) hours as needed.    . traMADol (ULTRAM) 50 MG tablet Take 50 mg by mouth daily.  0   No current facility-administered medications for this visit.     PHYSICAL EXAMINATION: ECOG PERFORMANCE STATUS: 1 - Symptomatic but completely ambulatory  There were no vitals filed for this visit. There were no  vitals filed for this visit.  GENERAL:alert, no distress and comfortable SKIN: skin color, texture, turgor are normal, no rashes or significant lesions EYES: normal, Conjunctiva are pink and non-injected, sclera clear OROPHARYNX:no exudate, no erythema and lips, buccal mucosa, and tongue normal  NECK: supple, thyroid normal size, non-tender, without nodularity LYMPH:  no palpable lymphadenopathy in the cervical, axillary or inguinal LUNGS: clear to auscultation and percussion with normal breathing effort HEART: regular rate & rhythm and no murmurs and no lower extremity edema ABDOMEN:abdomen soft, non-tender and normal bowel sounds MUSCULOSKELETAL:no cyanosis of digits and no clubbing  NEURO: alert & oriented x 3 with fluent speech, no focal motor/sensory deficits EXTREMITIES: No lower extremity edema BREAST:Large fungating tumor left breast. No palpable axillary supraclavicular or infraclavicular adenopathy no breast tenderness or nipple discharge. (exam performed in the presence of a chaperone)  LABORATORY DATA:  I have reviewed the data as listed   Chemistry      Component Value Date/Time   NA 139 11/21/2016 1248   NA 145 11/18/2016 1004   K 3.8 11/21/2016 1248   K 3.9 11/18/2016 1004   CL 104 11/21/2016 1248   CO2 28 11/21/2016 1248   CO2 26 11/18/2016 1004   BUN 13 11/21/2016 1248   BUN 11.2 11/18/2016 1004   CREATININE 0.64 11/21/2016 1248   CREATININE 0.8 11/18/2016 1004      Component Value Date/Time   CALCIUM 8.9 11/21/2016 1248   CALCIUM 9.5 11/18/2016 1004   ALKPHOS 110 11/18/2016 1004   AST 29 11/18/2016 1004   ALT 31 11/18/2016 1004   BILITOT 0.23 11/18/2016 1004       Lab Results  Component Value Date   WBC 8.5 11/21/2016   HGB 9.1 (L) 11/21/2016   HCT 28.2 (L) 11/21/2016   MCV 82.0 11/21/2016   PLT 361 11/21/2016   NEUTROABS 7.5 11/21/2016    ASSESSMENT & PLAN:  Breast cancer of upper-outer quadrant of left female breast ([place]) 09/26/2015 Left  breast 2:00 position suspicious mass 3.1 x 1.8 x 2.8 cm, indeterminate group of calcifications UIQ left breast needs stereotactic biopsy Left breast biopsy UOQ 10/22/2015: Invasive adenocarcinoma, grade 2-3, EF 20%, PR 0%, Ki-67 30%, left breast UIQ: Fibroadenoma with extensive calcifications Clinical stage in May 2017: T2 N0 stage II a Clinical stage 05/08/2016: Stage IV  Hospitalization 5/5-5/7: Breast abscess Patient was lost to follow up Refused treatment previously opting for homeopathic treatments.  Plan: 1. Palliative treatment with Taxol every 3 weeks -------------------------------------------------------------------- Current treatment: Cycle 1 day 8 Taxol Taxol toxicities:  Large fungating tumor: Wound care Was arranged through home health. But the patient was not responding to the phone calls. Monitoring closely for toxicities. Return to clinic in 2 weeks for cycle 2.   I spent 25 minutes talking to the patient of which more than half was spent in counseling and coordination of care.  No orders of the defined types were placed in this encounter.  The patient has a good understanding of the overall plan. she agrees with it. she will call with any problems that may develop before  the next visit here.   Rulon Eisenmenger, MD 11/26/16

## 2016-11-26 NOTE — Assessment & Plan Note (Deleted)
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  Plan: 1. Palliative treatment with Taxol every 3 weeks -------------------------------------------------------------------- Current treatment: Cycle 1 day 8 Taxol Taxol toxicities:  Large fungating tumor: Wound care is being provided through home health Monitoring closely for toxicities. Return to clinic in 2 weeks for cycle 2.

## 2016-11-30 ENCOUNTER — Telehealth: Payer: Self-pay

## 2016-11-30 NOTE — Telephone Encounter (Signed)
Have contacted pt everyday since last Thursday, but the phone listed was unavailable. Will be reaching out to SW for further assistance to see how we can get a hold of pt. Pt has a R picc line that needs flushed and dressing change. Pt had missed appointments with Dr.Gudena last week and is 1 week post chemo treatment. Have not visited any ED to have dressing changed on left breast fungating wound. Pt last seen last Monday for picc line dressing, flush and wound care dressing. Went over instructions for appointments in detail with pt verbally confirming each appointment. Pt also provided new permanent address and new phone number to update her info on epic. Still unable to contact phone number (phone number not available).

## 2016-11-30 NOTE — Telephone Encounter (Signed)
error 

## 2016-11-30 NOTE — Progress Notes (Signed)
Received news from SW regarding updated phone number for pt. Will update pt information [phone]. [facility] to see pt and will have SW come out to evaluate pt. Attempted to call this new number but ended up getting a vm. LVM with call back number to remind pt that she does have an appt today at 2pm

## 2016-12-02 ENCOUNTER — Ambulatory Visit (HOSPITAL_BASED_OUTPATIENT_CLINIC_OR_DEPARTMENT_OTHER): Payer: Medicaid Other

## 2016-12-02 ENCOUNTER — Encounter (HOSPITAL_BASED_OUTPATIENT_CLINIC_OR_DEPARTMENT_OTHER): Payer: Medicaid Other | Attending: Surgery

## 2016-12-02 DIAGNOSIS — L03313 Cellulitis of chest wall: Secondary | ICD-10-CM | POA: Insufficient documentation

## 2016-12-02 DIAGNOSIS — Z95828 Presence of other vascular implants and grafts: Secondary | ICD-10-CM | POA: Insufficient documentation

## 2016-12-02 DIAGNOSIS — Z452 Encounter for adjustment and management of vascular access device: Secondary | ICD-10-CM

## 2016-12-02 DIAGNOSIS — C787 Secondary malignant neoplasm of liver and intrahepatic bile duct: Secondary | ICD-10-CM | POA: Insufficient documentation

## 2016-12-02 DIAGNOSIS — Z79899 Other long term (current) drug therapy: Secondary | ICD-10-CM | POA: Diagnosis not present

## 2016-12-02 DIAGNOSIS — C7800 Secondary malignant neoplasm of unspecified lung: Secondary | ICD-10-CM | POA: Insufficient documentation

## 2016-12-02 DIAGNOSIS — Z170 Estrogen receptor positive status [ER+]: Secondary | ICD-10-CM | POA: Insufficient documentation

## 2016-12-02 DIAGNOSIS — C50912 Malignant neoplasm of unspecified site of left female breast: Secondary | ICD-10-CM | POA: Insufficient documentation

## 2016-12-02 DIAGNOSIS — C50412 Malignant neoplasm of upper-outer quadrant of left female breast: Secondary | ICD-10-CM

## 2016-12-02 MED ORDER — SODIUM CHLORIDE 0.9% FLUSH
10.0000 mL | Freq: Once | INTRAVENOUS | Status: AC
  Administered 2016-12-02: 10 mL
  Filled 2016-12-02: qty 10

## 2016-12-02 MED ORDER — HEPARIN SOD (PORK) LOCK FLUSH 100 UNIT/ML IV SOLN
500.0000 [IU] | Freq: Once | INTRAVENOUS | Status: AC
  Administered 2016-12-02: 250 [IU]
  Filled 2016-12-02: qty 5

## 2016-12-03 ENCOUNTER — Encounter: Payer: Self-pay | Admitting: *Deleted

## 2016-12-03 NOTE — Progress Notes (Signed)
[place] Work  Clinical Social Work was referred by [employer] and need for CSW follow up due to ongoing concerns. CSW team received call from [place], Ann Oliver [phone] stating pt was in apartment, but had very little furniture due to her original furniture in storage. CSW attempted to return [facility]K call and left message. This CSW has very limited resources for furniture as [place] is not affiliated with [employer] as a preferred provider. CSW researching possible resource options. CSW contacted pt at home and pt reports she really does need help with furniture. She denies issues with food currently. She is in the process of getting re-certified with medicaid transportation and plans to call them today. CSW team will follow closely as pt appears to need additional assistance navigating resources and keeping up with information. CSW provided pt with 2 one ride bus passes at visit on 12/02/16.     Clinical Social Work interventions: Resource assistance Pt advocacy  Loren Racer, LCSW, OSW-C Clinical Social Worker [place]  [place] Phone: [phone] Fax: [phone]

## 2016-12-07 ENCOUNTER — Ambulatory Visit (HOSPITAL_COMMUNITY): Admission: RE | Admit: 2016-12-07 | Payer: Medicaid Other | Source: Ambulatory Visit

## 2016-12-08 ENCOUNTER — Emergency Department (HOSPITAL_COMMUNITY)
Admission: EM | Admit: 2016-12-08 | Discharge: 2016-12-08 | Disposition: A | Discharge status: Home / Self Care | Payer: Medicaid Other | Attending: Emergency Medicine | Admitting: Emergency Medicine

## 2016-12-08 ENCOUNTER — Encounter (HOSPITAL_COMMUNITY): Payer: Self-pay | Admitting: Emergency Medicine

## 2016-12-08 DIAGNOSIS — Y999 Unspecified external cause status: Secondary | ICD-10-CM | POA: Insufficient documentation

## 2016-12-08 DIAGNOSIS — T148XXA Other injury of unspecified body region, initial encounter: Secondary | ICD-10-CM | POA: Diagnosis not present

## 2016-12-08 DIAGNOSIS — X58XXXA Exposure to other specified factors, initial encounter: Secondary | ICD-10-CM | POA: Insufficient documentation

## 2016-12-08 DIAGNOSIS — Y929 Unspecified place or not applicable: Secondary | ICD-10-CM | POA: Diagnosis not present

## 2016-12-08 DIAGNOSIS — Z79899 Other long term (current) drug therapy: Secondary | ICD-10-CM | POA: Diagnosis not present

## 2016-12-08 DIAGNOSIS — Y939 Activity, unspecified: Secondary | ICD-10-CM | POA: Insufficient documentation

## 2016-12-08 MED ORDER — ACETAMINOPHEN 325 MG PO TABS
650.0000 mg | ORAL_TABLET | Freq: Once | ORAL | Status: AC
  Administered 2016-12-08: 650 mg via ORAL
  Filled 2016-12-08: qty 2

## 2016-12-08 NOTE — ED Triage Notes (Signed)
Patient BIB [facility] from home c/o bleeding under left breast after removing a dressing. Pt has breast cancer metastasized to right lung. Pt has mass to upper left breast/side. ABD pad and gauze applied to bleeding.

## 2016-12-08 NOTE — ED Notes (Signed)
Bed: WA07 Expected date:  Expected time:  Means of arrival:  Comments: EMS-uncontrolled bleeding under breast

## 2016-12-08 NOTE — ED Notes (Signed)
Patient had a full body wipe down and given new scrubs.

## 2016-12-08 NOTE — Discharge Instructions (Signed)
We called the wound center, and they will see you tomorrow at 11:15 am.  Please do not change the dressing until you are seen by the wound center.

## 2016-12-09 ENCOUNTER — Other Ambulatory Visit: Payer: Self-pay

## 2016-12-09 ENCOUNTER — Ambulatory Visit: Payer: Self-pay

## 2016-12-09 ENCOUNTER — Ambulatory Visit (HOSPITAL_BASED_OUTPATIENT_CLINIC_OR_DEPARTMENT_OTHER): Payer: Medicaid Other | Admitting: Hematology and Oncology

## 2016-12-09 ENCOUNTER — Encounter: Payer: Self-pay | Admitting: Hematology and Oncology

## 2016-12-09 ENCOUNTER — Encounter: Payer: Self-pay | Admitting: Nutrition

## 2016-12-09 DIAGNOSIS — C50412 Malignant neoplasm of upper-outer quadrant of left female breast: Secondary | ICD-10-CM | POA: Diagnosis not present

## 2016-12-09 DIAGNOSIS — D4989 Neoplasm of unspecified behavior of other specified sites: Secondary | ICD-10-CM | POA: Diagnosis not present

## 2016-12-09 DIAGNOSIS — Z170 Estrogen receptor positive status [ER+]: Secondary | ICD-10-CM

## 2016-12-09 DIAGNOSIS — C50912 Malignant neoplasm of unspecified site of left female breast: Secondary | ICD-10-CM | POA: Diagnosis not present

## 2016-12-09 MED ORDER — HYDROCODONE-ACETAMINOPHEN 5-325 MG PO TABS: 1.0000 | ORAL_TABLET | Freq: Four times a day (QID) | ORAL | 0 refills | Status: DC | PRN

## 2016-12-09 MED FILL — HYDROCODON-APAP 5-325: 5-325 | 15 days supply | Qty: 60 | Fill #0

## 2016-12-09 NOTE — Progress Notes (Signed)
Patient Care Team: Center, [facility] as PCP - General  DIAGNOSIS:  Encounter Diagnosis  Name Primary?  . Malignant neoplasm of upper-outer quadrant of left breast in female, estrogen receptor positive ([place])     SUMMARY OF ONCOLOGIC HISTORY:   Breast cancer of upper-outer quadrant of left female breast ([place])   09/26/2015 Mammogram    Left breast 2:00 position suspicious mass 3.1 x 1.8 x 2.8 cm, indeterminate group of calcifications UIQ left breast needs stereotactic biopsy      10/22/2015 Initial Diagnosis    Left breast biopsy UOQ: Invasive adenocarcinoma, grade 2-3, EF 20%, PR 0%, Ki-67 30%, left breast UIQ: Fibroadenoma with extensive calcifications       Miscellaneous    Patient decided not to get any interventions and was lost to follow-up      05/06/2016 Imaging    CT chest: Heterogeneous necrotic mass left chest wall and UOQ 6.2 cm extending into axilla, ext into pectoralis major and to skin multiple enlarged left axillary LN, multiple more than 15 lung nodules, 1.5 cm lesion right lobe of the liver      09/25/2016 - 09/27/2016 Hospital Admission    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 She received one dose of chemotherapy on 11/18/2016. She came in today to tell me that she thinks that she is healing and that she does not want to receive any further chemotherapies. She informed me that she believes in her faith in God and to heel her and that she does not think she needs to go on chemotherapy. She tells me that she does have pain in the breast and takes ibuprofen. She ran out of her narcotic pain medication.  REVIEW OF  SYSTEMS:   Constitutional: Denies fevers, chills or abnormal weight loss Eyes: Denies blurriness of vision Ears, nose, mouth, throat, and face: Denies mucositis or sore throat Respiratory: Denies cough, dyspnea or wheezes Cardiovascular: Denies palpitation, chest discomfort Gastrointestinal:  Denies nausea, heartburn or change in bowel habits Skin: Denies abnormal skin rashes Lymphatics: Denies new lymphadenopathy or easy bruising Neurological:Denies numbness, tingling or new weaknesses Behavioral/Psych: Mood is stable, no new changes  Extremities: No lower extremity edema Breast: Painful left breast on the large fungating tumor All other systems were reviewed with the patient and are negative.  I have reviewed the past medical history, past surgical history, social history and family history with the patient and they are unchanged from previous note.  ALLERGIES:  is allergic to aspirin.  MEDICATIONS:  Current Outpatient Prescriptions  Medication Sig Dispense Refill  . cephALEXin (KEFLEX) 500 MG capsule Take 1 capsule (500 mg total) by mouth 4 (four) times daily. 40 capsule 0  . doxycycline (VIBRAMYCIN) 100 MG capsule Take 1 capsule (100 mg total) by mouth 2 (two) times daily. 20 capsule 0  . feeding supplement, ENSURE ENLIVE, (ENSURE ENLIVE) LIQD Take 237 mLs by mouth 3 (three) times daily between meals. 30 Bottle 0  . HYDROcodone-acetaminophen (NORCO/VICODIN) 5-325 MG tablet Take 1 tablet by mouth every 6 (six) hours as needed for moderate pain. 60 tablet 0  . ibuprofen (ADVIL,MOTRIN) 200 MG tablet Take 400-600  mg by mouth every 6 (six) hours as needed for mild pain.      No current facility-administered medications for this visit.     PHYSICAL EXAMINATION: ECOG PERFORMANCE STATUS: 1 - Symptomatic but completely ambulatory  Vitals:   12/09/16 1453  BP: (!) 157/80  Pulse: 89  Resp: 18  Temp: (!) 97.5 F (36.4 C)   Filed Weights   12/09/16 1453  Weight: 113 lb 11.2 oz  (51.6 kg)    GENERAL:alert, no distress and comfortable SKIN: skin color, texture, turgor are normal, no rashes or significant lesions EYES: normal, Conjunctiva are pink and non-injected, sclera clear OROPHARYNX:no exudate, no erythema and lips, buccal mucosa, and tongue normal  NECK: supple, thyroid normal size, non-tender, without nodularity LYMPH:  no palpable lymphadenopathy in the cervical, axillary or inguinal LUNGS: clear to auscultation and percussion with normal breathing effort HEART: regular rate & rhythm and no murmurs and no lower extremity edema ABDOMEN:abdomen soft, non-tender and normal bowel sounds MUSCULOSKELETAL:no cyanosis of digits and no clubbing  NEURO: alert & oriented x 3 with fluent speech, no focal motor/sensory deficits EXTREMITIES: No lower extremity edema  LABORATORY DATA:  I have reviewed the data as listed   Chemistry      Component Value Date/Time   NA 139 11/21/2016 1248   NA 145 11/18/2016 1004   K 3.8 11/21/2016 1248   K 3.9 11/18/2016 1004   CL 104 11/21/2016 1248   CO2 28 11/21/2016 1248   CO2 26 11/18/2016 1004   BUN 13 11/21/2016 1248   BUN 11.2 11/18/2016 1004   CREATININE 0.64 11/21/2016 1248   CREATININE 0.8 11/18/2016 1004      Component Value Date/Time   CALCIUM 8.9 11/21/2016 1248   CALCIUM 9.5 11/18/2016 1004   ALKPHOS 110 11/18/2016 1004   AST 29 11/18/2016 1004   ALT 31 11/18/2016 1004   BILITOT 0.23 11/18/2016 1004       Lab Results  Component Value Date   WBC 8.5 11/21/2016   HGB 9.1 (L) 11/21/2016   HCT 28.2 (L) 11/21/2016   MCV 82.0 11/21/2016   PLT 361 11/21/2016   NEUTROABS 7.5 11/21/2016    ASSESSMENT & PLAN:  Breast cancer of upper-outer quadrant of left female breast ([place]) 09/26/2015 Left breast 2:00 position suspicious mass 3.1 x 1.8 x 2.8 cm, indeterminate group of calcifications UIQ left breast needs stereotactic biopsy Left breast biopsy UOQ 10/22/2015: Invasive adenocarcinoma, grade 2-3, EF 20%, PR  0%, Ki-67 30%, left breast UIQ: Fibroadenoma with extensive calcifications Clinical stage in May 2017: T2 N0 stage II a Clinical stage 05/08/2016: Stage IV  Hospitalization 5/5-5/7: Breast abscess Patient was lost to follow up Refused treatment previously opting for homeopathic treatments.  Plan: 1. Palliative treatment with Taxol every 3 weeks started 11/18/2016 -------------------------------------------------------------------- Current treatment: Cycle 2 Taxol Chemotherapy toxicities: Patient actually tolerated chemotherapy extremely well. She did not have any side effects whatsoever. In spite of this she wants to stop her chemotherapy. She believes that her faith will heal her. I discussed with her that she is ultimately decided on how she gets treated. If she were to change her mind I instructed her to call us back next week.  If she does not want to receive any further chemotherapy then we will remove her PICC line. I discussed with her that if left untreated, patient will die from her metastatic disease  Pain issues: I given a prescription for Percocets. I truly believe that she is an significant pain  because of this large fungating chest wall tumor. Wound infection issues Return to clinic on an as-needed basis. I instructed the patient that if she were to not received chemotherapy then she could be seen by hospice and palliative care.  I spent 45 minutes talking to the patient of which more than half was spent in counseling and coordination of care.  No orders of the defined types were placed in this encounter.  The patient has a good understanding of the overall plan. she agrees with it. she will call with any problems that may develop before the next visit here.   Rulon Eisenmenger, MD 12/09/16

## 2016-12-09 NOTE — Assessment & Plan Note (Signed)
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  Plan: 1. Palliative treatment with Taxol every 3 weeks started 11/18/2016 -------------------------------------------------------------------- Current treatment: Cycle 2 Taxol Chemotherapy toxicities:  Wound infection issues: Patient is currently getting wound care at home. This has tremendously helped her. Return to clinic in 3 weeks for cycle 3

## 2016-12-10 NOTE — ED Provider Notes (Signed)
[facility] DEPT Provider Note   CSN: [id number] Arrival date & time: 12/08/16  1138     History   Chief Complaint Chief Complaint  Patient presents with  . Bleeding under breast    HPI Ann Oliver is a 61 y.o. female.  HPI Pt comes in with cc of bleeding. Pt has hx of fungating and necrotizing lesion to the L breast. PT gets the wound managed at the wound care. She reports that she started having bleeding from the wound site, thus she came to the ER. PT changes the wound daily and sees wound care as well. Pt not on blood thinners.  Past Medical History:  Diagnosis Date  . Cancer ([place])   . Sinusitis   . Tumor cells     Patient Active Problem List   Diagnosis Date Noted  . Port catheter in place 12/02/2016  . Necrotizing soft tissue infection 11/21/2016  . Cellulitis 10/28/2016  . Wound infection 10/28/2016  . Cellulitis of left breast 10/28/2016  . Encounter for palliative care   . Goals of care, counseling/discussion   . Breast cancer metastasized to lung, right ([place]) 09/25/2016  . Malnutrition of moderate degree 09/25/2016  . Breast cancer of upper-outer quadrant of left female breast ([place]) 10/22/2015  . THYROID NODULE, RIGHT 05/31/2007  . DEPRESSION 05/31/2007  . GERD 05/31/2007  . HOT FLASHES 05/31/2007  . HEADACHE 05/31/2007    Past Surgical History:  Procedure Laterality Date  . BIOPSY BREAST    . IR FLUORO GUIDE CV LINE RIGHT  11/06/2016  . IR US GUIDE VASC ACCESS RIGHT  11/06/2016    OB History    No data available       Home Medications    Prior to Admission medications   Medication Sig Start Date End Date Taking? Authorizing Provider  cephALEXin (KEFLEX) 500 MG capsule Take 1 capsule (500 mg total) by mouth 4 (four) times daily. 11/22/16  Yes Patrecia Pour, MD  doxycycline (VIBRAMYCIN) 100 MG capsule Take 1 capsule (100 mg total) by mouth 2 (two) times daily. 11/22/16  Yes Patrecia Pour, MD  feeding supplement, ENSURE ENLIVE, (ENSURE  ENLIVE) LIQD Take 237 mLs by mouth 3 (three) times daily between meals. 09/26/16  Yes [name], MD  ibuprofen (ADVIL,MOTRIN) 200 MG tablet Take 400-600 mg by mouth every 6 (six) hours as needed for mild pain.    Yes [provider]  HYDROcodone-acetaminophen (NORCO/VICODIN) 5-325 MG tablet Take 1 tablet by mouth every 6 (six) hours as needed for moderate pain. 12/09/16   Nicholas Lose, MD    Family History Family History  Problem Relation Age of Onset  . Breast cancer Neg Hx     Social History Social History  Substance Use Topics  . Smoking status: Never Smoker  . Smokeless tobacco: Never Used  . Alcohol use No     Allergies   Aspirin   Review of Systems Review of Systems  Constitutional: Negative for activity change.  Skin: Negative for wound.  Allergic/Immunologic: Positive for immunocompromised state.  Hematological: Does not bruise/bleed easily.     Physical Exam Updated Vital Signs BP 130/72   Pulse 82   Temp 98.5 F (36.9 C) (Oral)   Resp 16   Ht 5\' 1"  (1.549 m)   Wt 52.6 kg (116 lb)   SpO2 100%   BMI 21.92 kg/m   Physical Exam  Constitutional: She is oriented to person, place, and time. She appears well-developed.  HENT:  Head:  Normocephalic and atraumatic.  Eyes: EOM are normal.  Neck: Normal range of motion. Neck supple.  Cardiovascular: Normal rate.   Pulmonary/Chest: Effort normal.  Abdominal: Bowel sounds are normal.  Neurological: She is alert and oriented to person, place, and time.  Skin: Skin is warm and dry.  L breast has a fungating mass, there is small amount of blood oozing from the tissue. Tissue is hyperpigmented.  Nursing note and vitals reviewed.    ED Treatments / Results  Labs (all labs ordered are listed, but only abnormal results are displayed) Labs Reviewed - No data to display  EKG  EKG Interpretation None       Radiology No results found.  Procedures Procedures (including critical care  time)  Medications Ordered in ED Medications  acetaminophen (TYLENOL) tablet 650 mg (650 mg Oral Given 12/08/16 1438)     Initial Impression / Assessment and Plan / ED Course  I have reviewed the triage vital signs and the nursing notes.  Pertinent labs & imaging results that were available during my care of the patient were reviewed by me and considered in my medical decision making (see chart for details).     Pt with bleeding from the fungatic lesion to her breast. The tumor normally has blood vasculature with poor integrity, and this appears to be capillary or venous level bleed. We applied quick clot and the bleeding has stopped. I spoke with wound care - they will see patient tomorrow.   Final Clinical Impressions(s) / ED Diagnoses   Final diagnoses:  Bleeding from wound    New Prescriptions Discharge Medication List as of 12/08/2016  2:33 PM       Varney Biles, MD 12/10/16 [phone]

## 2016-12-13 ENCOUNTER — Encounter (HOSPITAL_COMMUNITY): Payer: Self-pay | Admitting: Emergency Medicine

## 2016-12-13 ENCOUNTER — Emergency Department (HOSPITAL_COMMUNITY)
Admission: EM | Admit: 2016-12-13 | Discharge: 2016-12-13 | Disposition: A | Discharge status: Home / Self Care | Payer: Medicaid Other | Attending: Emergency Medicine | Admitting: Emergency Medicine

## 2016-12-13 DIAGNOSIS — N6321 Unspecified lump in the left breast, upper outer quadrant: Secondary | ICD-10-CM | POA: Diagnosis not present

## 2016-12-13 DIAGNOSIS — Z79899 Other long term (current) drug therapy: Secondary | ICD-10-CM | POA: Insufficient documentation

## 2016-12-13 DIAGNOSIS — N6320 Unspecified lump in the left breast, unspecified quadrant: Secondary | ICD-10-CM

## 2016-12-13 MED ORDER — MORPHINE SULFATE (PF) 4 MG/ML IV SOLN: 4.0000 mg | Freq: Once | INTRAVENOUS | Status: DC

## 2016-12-13 MED ORDER — SODIUM CHLORIDE 0.9 % IV BOLUS (SEPSIS)
1000.0000 mL | Freq: Once | INTRAVENOUS | Status: AC
  Administered 2016-12-13: 1000 mL via INTRAVENOUS

## 2016-12-13 MED ORDER — ACETAMINOPHEN 325 MG PO TABS
650.0000 mg | ORAL_TABLET | Freq: Once | ORAL | Status: AC
  Administered 2016-12-13: 650 mg via ORAL
  Filled 2016-12-13: qty 2

## 2016-12-13 NOTE — ED Triage Notes (Signed)
Pt reports the wound under her L breast is bleeding.  Being treated at wound care center. Last visit was Friday.

## 2016-12-13 NOTE — Consult Note (Signed)
Chief Complaint:  Advanced left breast cancer with bleeding  History of Present Illness:  Ann Oliver is an 61 y.o. female came to the ER with bleeding from her necrotic left breast.  I was asked to see by Dr. Marsa Aris.  Prior to my arrival I had requested compression wrap.  Upon exam, there was a small area with minimal oozing at the time.  There were no silver nitrate sticks in the ER.  I wrapped the breast with xeroform gauze and requested compression ace wrap dressing.  If toilet mastectomy is not an option, then would consider RT debulkment.    Past Medical History:  Diagnosis Date  . Cancer ([place])   . Sinusitis   . Tumor cells     Past Surgical History:  Procedure Laterality Date  . BIOPSY BREAST    . IR FLUORO GUIDE CV LINE RIGHT  11/06/2016  . IR US GUIDE VASC ACCESS RIGHT  11/06/2016    No current facility-administered medications for this encounter.    Current Outpatient Prescriptions  Medication Sig Dispense Refill  . cephALEXin (KEFLEX) 500 MG capsule Take 1 capsule (500 mg total) by mouth 4 (four) times daily. 40 capsule 0  . doxycycline (VIBRAMYCIN) 100 MG capsule Take 1 capsule (100 mg total) by mouth 2 (two) times daily. 20 capsule 0  . feeding supplement, ENSURE ENLIVE, (ENSURE ENLIVE) LIQD Take 237 mLs by mouth 3 (three) times daily between meals. 30 Bottle 0  . HYDROcodone-acetaminophen (NORCO/VICODIN) 5-325 MG tablet Take 1 tablet by mouth every 6 (six) hours as needed for moderate pain. 60 tablet 0  . ibuprofen (ADVIL,MOTRIN) 200 MG tablet Take 400-600 mg by mouth every 6 (six) hours as needed for mild pain.      Aspirin Family History  Problem Relation Age of Onset  . Breast cancer Neg Hx    Social History:   reports that she has never smoked. She has never used smokeless tobacco. She reports that she does not drink alcohol or use drugs.   REVIEW OF SYSTEMS   Physical Exam:   Blood pressure (!) 171/85, pulse 87, temperature 98.1 F (36.7 C), temperature  source Oral, resp. rate 18, SpO2 99 %. There is no height or weight on file to calculate BMI.  Gen:  Thin AAF Neurological: Alert and oriented to person, place, and time. Motor and sensory function is grossly intact  Head: Normocephalic and atraumatic.  Breast:  There is a greater than my fist size mass emanating from the lateral aspect of the left breast.  There is overlying skin necrosis, a foul odor, and a superomedial ulcer that is oozing.  This was dressed with xeroform and compression dressing applied  LABORATORY RESULTS: No results found for this or any previous visit (from the past 48 hour(s)).   RADIOLOGY RESULTS: No results found.  Problem List: Patient Active Problem List   Diagnosis Date Noted  . Port catheter in place 12/02/2016  . Necrotizing soft tissue infection 11/21/2016  . Cellulitis 10/28/2016  . Wound infection 10/28/2016  . Cellulitis of left breast 10/28/2016  . Encounter for palliative care   . Goals of care, counseling/discussion   . Breast cancer metastasized to lung, right ([place]) 09/25/2016  . Malnutrition of moderate degree 09/25/2016  . Breast cancer of upper-outer quadrant of left female breast ([place]) 10/22/2015  . THYROID NODULE, RIGHT 05/31/2007  . DEPRESSION 05/31/2007  . GERD 05/31/2007  . HOT FLASHES 05/31/2007  . HEADACHE 05/31/2007    Assessment & Plan:  Bleeding from advance stage IV left breast cancer.  Controlled with topical measures.      Matt B. Hassell Done, MD, [facility] [phone] beeper [phone]  12/13/2016 6:29 PM

## 2016-12-13 NOTE — ED Provider Notes (Addendum)
[facility] DEPT Provider Note   CSN: [id number] Arrival date & time: 12/13/16  1215     History   Chief Complaint Chief Complaint  Patient presents with  . Wound Check    HPI Ann Oliver is a 61 y.o. female.  Level 5 caveat for urgent need for intervention. Patient has a known left breast mass with metastasis to the lung. The mass is now bleeding excessively. She has been going to the wound care center for treatment. Over the weekend, the bleeding has increased.      Past Medical History:  Diagnosis Date  . Cancer ([place])   . Sinusitis   . Tumor cells     Patient Active Problem List   Diagnosis Date Noted  . Port catheter in place 12/02/2016  . Necrotizing soft tissue infection 11/21/2016  . Cellulitis 10/28/2016  . Wound infection 10/28/2016  . Cellulitis of left breast 10/28/2016  . Encounter for palliative care   . Goals of care, counseling/discussion   . Breast cancer metastasized to lung, right ([place]) 09/25/2016  . Malnutrition of moderate degree 09/25/2016  . Breast cancer of upper-outer quadrant of left female breast ([place]) 10/22/2015  . THYROID NODULE, RIGHT 05/31/2007  . DEPRESSION 05/31/2007  . GERD 05/31/2007  . HOT FLASHES 05/31/2007  . HEADACHE 05/31/2007    Past Surgical History:  Procedure Laterality Date  . BIOPSY BREAST    . IR FLUORO GUIDE CV LINE RIGHT  11/06/2016  . IR US GUIDE VASC ACCESS RIGHT  11/06/2016    OB History    No data available       Home Medications    Prior to Admission medications   Medication Sig Start Date End Date Taking? Authorizing Provider  cephALEXin (KEFLEX) 500 MG capsule Take 1 capsule (500 mg total) by mouth 4 (four) times daily. 11/22/16   Patrecia Pour, MD  doxycycline (VIBRAMYCIN) 100 MG capsule Take 1 capsule (100 mg total) by mouth 2 (two) times daily. 11/22/16   Patrecia Pour, MD  feeding supplement, ENSURE ENLIVE, (ENSURE ENLIVE) LIQD Take 237 mLs by mouth 3 (three) times daily between meals.  09/26/16   [name], MD  HYDROcodone-acetaminophen (NORCO/VICODIN) 5-325 MG tablet Take 1 tablet by mouth every 6 (six) hours as needed for moderate pain. 12/09/16   Nicholas Lose, MD  ibuprofen (ADVIL,MOTRIN) 200 MG tablet Take 400-600 mg by mouth every 6 (six) hours as needed for mild pain.     [provider]    Family History Family History  Problem Relation Age of Onset  . Breast cancer Neg Hx     Social History Social History  Substance Use Topics  . Smoking status: Never Smoker  . Smokeless tobacco: Never Used  . Alcohol use No     Allergies   Aspirin   Review of Systems Review of Systems  All other systems reviewed and are negative.    Physical Exam Updated Vital Signs BP (!) 171/85 (BP Location: Left Arm)   Pulse 87   Temp 98.1 F (36.7 C) (Oral)   Resp 18   SpO2 99%   Physical Exam  Constitutional: She is oriented to person, place, and time. She appears well-developed and well-nourished.  HENT:  Head: Normocephalic and atraumatic.  Eyes: Conjunctivae are normal.  Neck: Neck supple.  Cardiovascular: Normal rate and regular rhythm.   Pulmonary/Chest: Effort normal and breath sounds normal.  Abdominal: Soft. Bowel sounds are normal.  Musculoskeletal: Normal range of motion.  Neurological:  She is alert and oriented to person, place, and time.  Skin:  Large bleeding mass approximately 15 cm in dia on left chest wall  Psychiatric: She has a normal mood and affect. Her behavior is normal.  Nursing note and vitals reviewed.    ED Treatments / Results  Labs (all labs ordered are listed, but only abnormal results are displayed) Labs Reviewed - No data to display  EKG  EKG Interpretation None       Radiology No results found.  Procedures Procedures (including critical care time)  Medications Ordered in ED Medications  sodium chloride 0.9 % bolus 1,000 mL (1,000 mLs Intravenous New Bag/Given 12/13/16 1713)     Initial  Impression / Assessment and Plan / ED Course  I have reviewed the triage vital signs and the nursing notes.  Pertinent labs & imaging results that were available during my care of the patient were reviewed by me and considered in my medical decision making (see chart for details).     Wound is bleeding. I attempted to suture the bleeding area; however, the skin was too friable to accept the suture. Will consult general surgery.   1830:  Discuss with general surgeon. Will reapply compression dressing and follow-up at the wound care center. Final Clinical Impressions(s) / ED Diagnoses   Final diagnoses:  Left breast mass    New Prescriptions New Prescriptions   No medications on file     Nat Christen, MD 12/13/16 Vickii Penna, MD 12/13/16 1721    Nat Christen, MD 12/13/16 [phone]

## 2016-12-13 NOTE — ED Notes (Signed)
Bed: WA04 Expected date:  Expected time:  Means of arrival:  Comments: 

## 2016-12-13 NOTE — Discharge Instructions (Signed)
Call the wound care center tomorrow for follow-up. Keep dressing on until then.

## 2016-12-15 ENCOUNTER — Telehealth: Payer: Self-pay

## 2016-12-15 ENCOUNTER — Telehealth: Payer: Self-pay | Admitting: *Deleted

## 2016-12-15 NOTE — Telephone Encounter (Signed)
Called pt and spoke with her regarding resuming treatment.Pt is willing to have chemo again. Told pt will send scheduling a message for lab/fl and infusion either this week or next week. Will also add flush appointments m/w/f. Pt verbalized understanding and will wait for updated appointment.

## 2016-12-15 NOTE — Telephone Encounter (Signed)
Thank you will notify Dr.Gudena and see if she can be seen sooner.

## 2016-12-15 NOTE — Telephone Encounter (Signed)
Received call from pt stating that she wants to continue with treatment & was told to let Dr [name] know.  Message forwarded to Dr Rolla Plate RN.  Informed of next appt date & time & informed pt that if she needed to be seen sooner someone would call her.

## 2016-12-16 DIAGNOSIS — C50912 Malignant neoplasm of unspecified site of left female breast: Secondary | ICD-10-CM | POA: Diagnosis not present

## 2016-12-17 ENCOUNTER — Telehealth: Payer: Self-pay | Admitting: Hematology and Oncology

## 2016-12-17 NOTE — Telephone Encounter (Signed)
lvm to inform pt next week appt per sch msg

## 2016-12-19 ENCOUNTER — Emergency Department (HOSPITAL_COMMUNITY)
Admission: EM | Admit: 2016-12-19 | Discharge: 2016-12-20 | Disposition: A | Discharge status: Home / Self Care | Payer: Medicaid Other | Attending: Emergency Medicine | Admitting: Emergency Medicine

## 2016-12-19 ENCOUNTER — Encounter (HOSPITAL_COMMUNITY): Payer: Self-pay

## 2016-12-19 DIAGNOSIS — Z5189 Encounter for other specified aftercare: Secondary | ICD-10-CM | POA: Insufficient documentation

## 2016-12-19 DIAGNOSIS — D0582 Other specified type of carcinoma in situ of left breast: Secondary | ICD-10-CM | POA: Insufficient documentation

## 2016-12-19 MED ORDER — SILVER NITRATE-POT NITRATE 75-25 % EX MISC
1.0000 "application " | Freq: Once | CUTANEOUS | Status: AC
  Administered 2016-12-20: 1 via TOPICAL
  Filled 2016-12-19: qty 1

## 2016-12-19 NOTE — ED Triage Notes (Signed)
Wants dressing changed on left breast being seen at wound center for this no pain voiced no drainage noted.

## 2016-12-19 NOTE — ED Provider Notes (Signed)
[facility] DEPT Provider Note   CSN: [id number] Arrival date & time: 12/19/16  2202     History   Chief Complaint Chief Complaint  Patient presents with  . Wound Check    HPI Ann Oliver is a 61 y.o. female with a history of a fungating left breast mass who is here today requesting a dressing change. She is seen at the wound center for this. She denies any pain currently, no fevers/chills.  She reports that during her last dressing change there were multiple areas of bleeding.  She denies any pain at this time.  HPI  Past Medical History:  Diagnosis Date  . Cancer ([place])   . Sinusitis   . Tumor cells     Patient Active Problem List   Diagnosis Date Noted  . Port catheter in place 12/02/2016  . Necrotizing soft tissue infection 11/21/2016  . Cellulitis 10/28/2016  . Wound infection 10/28/2016  . Cellulitis of left breast 10/28/2016  . Encounter for palliative care   . Goals of care, counseling/discussion   . Breast cancer metastasized to lung, right ([place]) 09/25/2016  . Malnutrition of moderate degree 09/25/2016  . Breast cancer of upper-outer quadrant of left female breast ([place]) 10/22/2015  . THYROID NODULE, RIGHT 05/31/2007  . DEPRESSION 05/31/2007  . GERD 05/31/2007  . HOT FLASHES 05/31/2007  . HEADACHE 05/31/2007    Past Surgical History:  Procedure Laterality Date  . BIOPSY BREAST    . IR FLUORO GUIDE CV LINE RIGHT  11/06/2016  . IR US GUIDE VASC ACCESS RIGHT  11/06/2016    OB History    No data available       Home Medications    Prior to Admission medications   Medication Sig Start Date End Date Taking? Authorizing Provider  cephALEXin (KEFLEX) 500 MG capsule Take 1 capsule (500 mg total) by mouth 4 (four) times daily. 11/22/16   Ann Pour, MD  doxycycline (VIBRAMYCIN) 100 MG capsule Take 1 capsule (100 mg total) by mouth 2 (two) times daily. 11/22/16   Ann Pour, MD  feeding supplement, ENSURE ENLIVE, (ENSURE ENLIVE) LIQD Take 237 mLs by  mouth 3 (three) times daily between meals. 09/26/16   Ann Aloe, MD  HYDROcodone-acetaminophen (NORCO/VICODIN) 5-325 MG tablet Take 1 tablet by mouth every 6 (six) hours as needed for moderate pain. 12/09/16   Nicholas Lose, MD  ibuprofen (ADVIL,MOTRIN) 200 MG tablet Take 400-600 mg by mouth every 6 (six) hours as needed for mild pain.     [provider]    Family History Family History  Problem Relation Age of Onset  . Breast cancer Neg Hx     Social History Social History  Substance Use Topics  . Smoking status: Never Smoker  . Smokeless tobacco: Never Used  . Alcohol use No     Allergies   Aspirin   Review of Systems Review of Systems  Constitutional: Negative for chills, fatigue and fever.  Respiratory: Negative for cough and shortness of breath.   Gastrointestinal: Negative for nausea.  Skin:       Large breast mass to left lateral chest wall.     Physical Exam Updated Vital Signs BP 132/72 (BP Location: Left Arm)   Pulse 81   Temp 99 F (37.2 C) (Oral)   Resp 16   Wt 55.2 kg (121 lb 12.8 oz)   SpO2 100%   BMI 23.01 kg/m   Physical Exam  Constitutional: She appears well-developed and well-nourished.  Eyes: Conjunctivae are normal. No scleral icterus.  Neck: Normal range of motion.  Cardiovascular: Normal rate and intact distal pulses.   Pulmonary/Chest: Effort normal. No stridor. No respiratory distress.  Skin: Skin is warm and dry. She is not diaphoretic.  Large fungating breast mass to left breast/lateral chest.  There are multiple necrotic areas and multiple areas of bleeding.    Psychiatric: She has a normal mood and affect.  Nursing note and vitals reviewed.    ED Treatments / Results  Labs (all labs ordered are listed, but only abnormal results are displayed) Labs Reviewed - No data to display  EKG  EKG Interpretation None       Radiology No results found.  Procedures Procedures (including critical care time) Dressing  change Dressing was unwrapped with localized area of spurting blood present. Pressure was applied and Dr. Tamera Punt used silver nitrate sticks. The mass/wounds were dressed with Surgicel, Telfa dressing, gauze, Kerlix, and Coban.  She was observed in the emergency room without evidence of strike through to the dressings. Her dressing was then covered with an Ace wrap per her request for compression and to provide support.   Medications Ordered in ED Medications  silver nitrate applicators applicator 1 application (1 application Topical Given 12/20/16 0049)  ibuprofen (ADVIL,MOTRIN) tablet 600 mg (600 mg Oral Given 12/20/16 0049)     Initial Impression / Assessment and Plan / ED Course  I have reviewed the triage vital signs and the nursing notes.  Pertinent labs & imaging results that were available during my care of the patient were reviewed by me and considered in my medical decision making (see chart for details).     [name] presents for a wound check/dressing change of her left fungating breast mass. She has been seen in the emergency room multiple times regarding this breast mass. Today her dressings were changed per her request with multiple areas of bleeding present.  Her bleeding was treated with compression, Surgicel foam, silver nitrate, and pressure. Her wounds were redressed and she was instructed to follow up with wound care clinic. I discussed with her attempting to arrange with wound care/home health that her dressing changes happen on Friday and Monday so that she does not have to be seen in the emergency room over the weekend for this. I did emphasize to patient that she can come to the emergency room for dressing changes as needed, however explained to her that we may not have the specialized materials that the wound care clinic uses. She was instructed to watch for strike through on her dressing, continue to monitor herself for nausea, fever, chills or vomiting. She states  that she is still taking antibiotics.   At this time there does not appear to be any evidence of an acute emergency medical condition and the patient appears stable for discharge with appropriate outpatient follow up.  Final Clinical Impressions(s) / ED Diagnoses   Final diagnoses:  Visit for wound check    New Prescriptions Discharge Medication List as of 12/20/2016 12:44 AM       Lorin Glass, PA-C 12/20/16 0132    Malvin Johns, MD 12/20/16 1203

## 2016-12-20 MED ORDER — IBUPROFEN 200 MG PO TABS
600.0000 mg | ORAL_TABLET | Freq: Once | ORAL | Status: AC
  Administered 2016-12-20: 600 mg via ORAL
  Filled 2016-12-20: qty 3

## 2016-12-20 NOTE — Discharge Instructions (Signed)
Please discuss with the wound care center about changing your dressing change days so that you do not have to be seen in the emergency room on the weekends for this.  While we are able to change dressings we do not have many of the specialized materials that the wound care center tends to use. It is best if the wound care center or your home health nurse can change your dressings, however we will still change them here if needed.

## 2016-12-23 ENCOUNTER — Telehealth: Payer: Self-pay | Admitting: *Deleted

## 2016-12-23 ENCOUNTER — Telehealth: Payer: Self-pay | Admitting: Hematology and Oncology

## 2016-12-23 ENCOUNTER — Telehealth: Payer: Self-pay

## 2016-12-23 ENCOUNTER — Ambulatory Visit: Payer: Self-pay

## 2016-12-23 ENCOUNTER — Other Ambulatory Visit: Payer: Self-pay

## 2016-12-23 ENCOUNTER — Ambulatory Visit: Payer: Self-pay | Admitting: Hematology and Oncology

## 2016-12-23 NOTE — Telephone Encounter (Signed)
"  I will not be able to come in today.  Unable to obtain Water quality scientist.   Received today's  appointment Monday, Social Services needs three business to arrange transportation.  I can try to call someone to come later today around 1:30 pm.  Can they do something for me today?  Return call [phone]."  Message left for collaborative.  FYI Confirmed with [facility], [place] clients must call case manager three business days before appointments to schedule.  Five business day notification needed for appointments outside of  [facility].  A standing order can be set up for regularly scheduled appointments, ie. P.I.[facility].C care every MWF.

## 2016-12-23 NOTE — Telephone Encounter (Signed)
Called pt to let her know of her appt for chemo on Friday 12/25/16. Pt confirmed that she has a friend that will drive her to the cancer center. Told pt to arrive at 1130 so she will be on time for all her appts that day. Pt verbalized understanding and wrote down appts.

## 2016-12-23 NOTE — Telephone Encounter (Signed)
sw pt to confirm r/s appt to 8/3 at 1245 per sch msg

## 2016-12-23 NOTE — Telephone Encounter (Signed)
Returned call to patient as requested by [facility] team for work-in today.  Noted appointments being rescheduled for Friday, December 25, 2016.  Patienmt denies land line to be reached faster.  Confirms call to [employer] for transportation.

## 2016-12-23 NOTE — Telephone Encounter (Signed)
Late entry - pt came in to the clinic at 230 pm stating need for dressing change and PICC line care-  Laurence was very emotional and crying when brought in to exam room for care.  Upon further inquiry - Shani states feelings of " I need to have options " relating to getting her dressing changed.  She states she was unable to get to the Wound Care for her appointment and [facility] didn't come out or call her ( per her statements )   This RN verified that pt is still residing at current address and that her phone is working as well as with her for calls. Harolyn verified above.  This RN validated pt's feelings per above including goal of this office is to help her in her current situation.  Left breast dressing was removed with use of saline to rinse due to dressing against her skin tightly adhered - minimal bleeding occurred with use of above.  Overall fungating wound had mix of necrotic tissue with areas of granulation. No obvious sites of infection or drainage besides serosangueous.  Wet to dry dressing applied to site with reinforcement of Kerlex, ABD pads and Cobane- site secured with Kerlex.  Pt tolerated the above with discomfort.  PICC line care given.  This RN during the above interaction discussed with pt need to maintain her appointment on 12/23/2016 for follow up - though Honora stated she was unsure why - she stated reluctance to proceed with further chemo stating " would I get it after my appointment"- she stated to this RN she has transportation available.  This RN stated to Kasy our goal is to assist her with her diagnosis - including need for the MD to assess her wound, and offer choices that could help her. Reiterated that choices are given and that she can tell us what she would like to do.  By not coming she may not be to receive care that could help her.  Pt very appreciative of above interaction including understanding of appointment today.  Pt did call this AM and left  VM - stating she can not make her appointment due to need to arrange for transportation.

## 2016-12-24 ENCOUNTER — Encounter (HOSPITAL_COMMUNITY): Payer: Self-pay | Admitting: Emergency Medicine

## 2016-12-24 ENCOUNTER — Emergency Department (HOSPITAL_COMMUNITY)
Admission: EM | Admit: 2016-12-24 | Discharge: 2016-12-24 | Disposition: A | Discharge status: Home / Self Care | Payer: Medicaid Other | Attending: Emergency Medicine | Admitting: Emergency Medicine

## 2016-12-24 DIAGNOSIS — F329 Major depressive disorder, single episode, unspecified: Secondary | ICD-10-CM | POA: Diagnosis not present

## 2016-12-24 DIAGNOSIS — N99820 Postprocedural hemorrhage and hematoma of a genitourinary system organ or structure following a genitourinary system procedure: Secondary | ICD-10-CM | POA: Insufficient documentation

## 2016-12-24 DIAGNOSIS — C50412 Malignant neoplasm of upper-outer quadrant of left female breast: Secondary | ICD-10-CM | POA: Diagnosis not present

## 2016-12-24 DIAGNOSIS — Z4800 Encounter for change or removal of nonsurgical wound dressing: Secondary | ICD-10-CM

## 2016-12-24 DIAGNOSIS — Z79899 Other long term (current) drug therapy: Secondary | ICD-10-CM | POA: Insufficient documentation

## 2016-12-24 DIAGNOSIS — C7801 Secondary malignant neoplasm of right lung: Secondary | ICD-10-CM | POA: Diagnosis not present

## 2016-12-24 MED ORDER — SILVER NITRATE-POT NITRATE 75-25 % EX MISC
1.0000 | Freq: Once | CUTANEOUS | Status: DC
  Filled 2016-12-24: qty 1

## 2016-12-24 MED ORDER — ACETAMINOPHEN 325 MG PO TABS
650.0000 mg | ORAL_TABLET | Freq: Once | ORAL | Status: AC
  Administered 2016-12-24: 650 mg via ORAL
  Filled 2016-12-24: qty 2

## 2016-12-24 NOTE — ED Provider Notes (Signed)
[place] DEPT Provider Note   CSN: [id number] Arrival date & time: 12/24/16  1742  By signing my name below, I, Reola Mosher, attest that this documentation has been prepared under the direction and in the presence of Eliezer Mccoy, PA-C.  Electronically Signed: Reola Mosher, ED Scribe. 12/24/16. 8:01 PM.  History   Chief Complaint Chief Complaint  Patient presents with  . Dressing Change   The history is provided by the patient. No language interpreter was used.    HPI Comments: Ann Oliver is a 61 y.o. female who presents to the Emergency Department with a h/o fungating mass to the left breast who presents for a dressing change to the area. She reports that the area presently drains and bleeds from three different sites around the left breast, no recent increase in drainage/bleeding the usual. No recent increased pain. She does currently receive dressing changes at home, but she will intermittently come into the ED to have this changes as well. She is currently followed by a wound care center for this, per prior chart review. Pt does currently tolerate chemotherapy to a PICC line in place to the right upper arm. She denies any fever, chills, or any other associated symptoms.   Past Medical History:  Diagnosis Date  . Cancer ([place])   . Sinusitis   . Tumor cells    Patient Active Problem List   Diagnosis Date Noted  . Port catheter in place 12/02/2016  . Necrotizing soft tissue infection 11/21/2016  . Cellulitis 10/28/2016  . Wound infection 10/28/2016  . Cellulitis of left breast 10/28/2016  . Encounter for palliative care   . Goals of care, counseling/discussion   . 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 Malnutrition of moderate degree 09/25/2016  . Breast cancer of upper-outer quadrant of left female breast ([place]) 10/22/2015  . THYROID NODULE, RIGHT 05/31/2007  . DEPRESSION 05/31/2007  . GERD 05/31/2007  . HOT FLASHES 05/31/2007   . HEADACHE 05/31/2007   Past Surgical History:  Procedure Laterality Date  . BIOPSY BREAST    . IR FLUORO GUIDE CV LINE RIGHT  11/06/2016  . IR US GUIDE VASC ACCESS RIGHT  11/06/2016   OB History    No data available     Home Medications    Prior to Admission medications   Medication Sig Start Date End Date Taking? Authorizing Provider  cephALEXin (KEFLEX) 500 MG capsule Take 1 capsule (500 mg total) by mouth 4 (four) times daily. 11/22/16   Patrecia Pour, MD  doxycycline (VIBRAMYCIN) 100 MG capsule Take 1 capsule (100 mg total) by mouth 2 (two) times daily. 11/22/16   Patrecia Pour, MD  feeding supplement, ENSURE ENLIVE, (ENSURE ENLIVE) LIQD Take 237 mLs by mouth 3 (three) times daily between meals. 09/26/16   [name], MD  HYDROcodone-acetaminophen (NORCO/VICODIN) 5-325 MG tablet Take 1 tablet by mouth every 6 (six) hours as needed for moderate pain. 12/09/16   Nicholas Lose, MD  ibuprofen (ADVIL,MOTRIN) 200 MG tablet Take 400-600 mg by mouth every 6 (six) hours as needed for mild pain.     [provider]   Family History Family History  Problem Relation Age of Onset  . Breast cancer Neg Hx    Social History Social History  Substance Use Topics  . Smoking status: Never Smoker  . Smokeless tobacco: Never Used  . Alcohol use No   Allergies   Aspirin  Review of Systems Review of Systems  Constitutional: Negative for chills and fever.  HENT: Negative for facial swelling and sore throat.   Respiratory: Negative for shortness of breath.   Cardiovascular: Negative for chest pain.  Gastrointestinal: Negative for abdominal pain, nausea and vomiting.  Genitourinary: Negative for dysuria.  Musculoskeletal: Negative for back pain.  Skin: Positive for wound. Negative for rash.  Neurological: Negative for headaches.  Psychiatric/Behavioral: The patient is not nervous/anxious.   All other systems reviewed and are negative.  Physical Exam Updated Vital Signs BP (!)  138/100 (BP Location: Right Arm)   Pulse (!) 101   Temp 97.8 F (36.6 C) (Oral)   Resp 16   Ht 5\' 1"  (1.549 m) Comment: Simultaneous filing. User may not have seen previous data.  Wt 54.9 kg (121 lb) Comment: Simultaneous filing. User may not have seen previous data.  SpO2 100%   BMI 22.86 kg/m   Physical Exam  Constitutional: She appears well-developed and well-nourished. No distress.  HENT:  Head: Normocephalic and atraumatic.  Mouth/Throat: Oropharynx is clear and moist. No oropharyngeal exudate.  Eyes: Pupils are equal, round, and reactive to light. Conjunctivae are normal. Right eye exhibits no discharge. Left eye exhibits no discharge. No scleral icterus.  Neck: Normal range of motion. Neck supple. No thyromegaly present.  Cardiovascular: Regular rhythm, normal heart sounds and intact distal pulses.  Exam reveals no gallop and no friction rub.   No murmur heard. Pulmonary/Chest: Effort normal and breath sounds normal. No stridor. No respiratory distress. She has no wheezes. She has no rales.  Patient with fungating mass to the left chest wall with small spurting bleed unwrapping dressing; no other excessive bleeding or drainage noted  Musculoskeletal: She exhibits no edema.  Lymphadenopathy:    She has no cervical adenopathy.  Neurological: She is alert. Coordination normal.  Skin: Skin is warm and dry. No rash noted. She is not diaphoretic. No pallor.  Psychiatric: She has a normal mood and affect.  Nursing note and vitals reviewed.  ED Treatments / Results  DIAGNOSTIC STUDIES: Oxygen Saturation is 100% on RA, normal by my interpretation.   COORDINATION OF CARE: 8:00 PM-Discussed next steps with pt. Pt verbalized understanding and is agreeable with the plan.   Labs (all labs ordered are listed, but only abnormal results are displayed) Labs Reviewed - No data to display  EKG  EKG Interpretation None      Radiology No results found.  Procedures Procedures    Medications Ordered in ED Medications  silver nitrate applicators applicator 1 Stick (1 Stick Topical Not Given 12/24/16 2059)  acetaminophen (TYLENOL) tablet 650 mg (650 mg Oral Given 12/24/16 2109)    Initial Impression / Assessment and Plan / ED Course  I have reviewed the triage vital signs and the nursing notes.  Pertinent labs & imaging results that were available during my care of the patient were reviewed by me and considered in my medical decision making (see chart for details).     Patient with chronic fungating breast mass to the left chest wall. A small, spurting arterial bleed was cauterized with silver nitrate. Otherwise, no other areas of excessive bleeding. Dressing changed with 4 x 4 gauze has is at the base, Kerlix, abdominal pad, Coband, an Ace wrap surrounding. No signs of infection or changes from past examination here. Dr. Gilford Raid assisted dressing change and cauterization. Patient advised to follow-up with her doctor at her scheduled appointment tomorrow and to continue dressing changes at home by her home nurse. Return precautions discussed.  Patient understands and agrees with plan. Patient discharged in satisfactory condition.  Final Clinical Impressions(s) / ED Diagnoses   Final diagnoses:  Dressing change   New Prescriptions Discharge Medication List as of 12/24/2016  8:55 PM     I personally performed the services described in this documentation, which was scribed in my presence. The recorded information has been reviewed and is accurate.     Frederica Kuster, PA-C 12/24/16 2130    Isla Pence, MD 12/24/16 2231

## 2016-12-24 NOTE — ED Triage Notes (Signed)
Pt st's she needs dressing changed on left breast because wound nurse did not come today.  Pt denies any pain

## 2016-12-24 NOTE — ED Notes (Signed)
The pt is getting radiation for breast cancer  She has her lt breast bandGE DRESSED [facility] DID NOT COME TODAY  LARGE BANDAGE

## 2016-12-24 NOTE — Discharge Instructions (Signed)
Please follow-up at your doctor's appointment tomorrow. Have your wound care nurse continue changing ingestions. Please return to the emergency department if you develop any new or worsening symptoms.

## 2016-12-25 ENCOUNTER — Ambulatory Visit (HOSPITAL_BASED_OUTPATIENT_CLINIC_OR_DEPARTMENT_OTHER): Payer: Medicaid Other | Admitting: Hematology and Oncology

## 2016-12-25 ENCOUNTER — Other Ambulatory Visit: Payer: Self-pay

## 2016-12-25 ENCOUNTER — Encounter: Payer: Self-pay | Admitting: *Deleted

## 2016-12-25 ENCOUNTER — Ambulatory Visit: Payer: Medicaid Other

## 2016-12-25 ENCOUNTER — Other Ambulatory Visit (HOSPITAL_BASED_OUTPATIENT_CLINIC_OR_DEPARTMENT_OTHER): Payer: Medicaid Other

## 2016-12-25 ENCOUNTER — Encounter: Payer: Self-pay | Admitting: Hematology and Oncology

## 2016-12-25 ENCOUNTER — Other Ambulatory Visit: Payer: Self-pay | Admitting: Hematology and Oncology

## 2016-12-25 ENCOUNTER — Ambulatory Visit (HOSPITAL_BASED_OUTPATIENT_CLINIC_OR_DEPARTMENT_OTHER): Payer: Medicaid Other

## 2016-12-25 DIAGNOSIS — C50412 Malignant neoplasm of upper-outer quadrant of left female breast: Secondary | ICD-10-CM

## 2016-12-25 DIAGNOSIS — C773 Secondary and unspecified malignant neoplasm of axilla and upper limb lymph nodes: Secondary | ICD-10-CM

## 2016-12-25 DIAGNOSIS — Z170 Estrogen receptor positive status [ER+]: Principal | ICD-10-CM

## 2016-12-25 DIAGNOSIS — C7800 Secondary malignant neoplasm of unspecified lung: Secondary | ICD-10-CM

## 2016-12-25 DIAGNOSIS — Z95828 Presence of other vascular implants and grafts: Secondary | ICD-10-CM

## 2016-12-25 DIAGNOSIS — N611 Abscess of the breast and nipple: Secondary | ICD-10-CM | POA: Diagnosis not present

## 2016-12-25 DIAGNOSIS — Z5111 Encounter for antineoplastic chemotherapy: Secondary | ICD-10-CM

## 2016-12-25 DIAGNOSIS — D242 Benign neoplasm of left breast: Secondary | ICD-10-CM | POA: Diagnosis not present

## 2016-12-25 DIAGNOSIS — C50911 Malignant neoplasm of unspecified site of right female breast: Secondary | ICD-10-CM

## 2016-12-25 LAB — COMPREHENSIVE METABOLIC PANEL
ALBUMIN: 2.9 g/dL — AB (ref 3.5–5.0)
ALK PHOS: 92 U/L (ref 40–150)
ALT: 14 U/L (ref 0–55)
AST: 23 U/L (ref 5–34)
Anion Gap: 9 mEq/L (ref 3–11)
BUN: 12.7 mg/dL (ref 7.0–26.0)
CALCIUM: 9 mg/dL (ref 8.4–10.4)
CHLORIDE: 107 meq/L (ref 98–109)
CO2: 26 mEq/L (ref 22–29)
Creatinine: 0.8 mg/dL (ref 0.6–1.1)
EGFR: 87 mL/min/{1.73_m2} — ABNORMAL LOW (ref >90)
Glucose: 100 mg/dl (ref 70–140)
POTASSIUM: 3.4 meq/L — AB (ref 3.5–5.1)
Sodium: 142 mEq/L (ref 136–145)
Total Bilirubin: <0.22 mg/dL (ref 0.20–1.20)
Total Protein: 6.4 g/dL (ref 6.4–8.3)

## 2016-12-25 LAB — CBC WITH DIFFERENTIAL/PLATELET
BASO%: 0.5 % (ref 0.0–2.0)
BASOS ABS: 0.1 10*3/uL (ref 0.0–0.1)
EOS%: 2.9 % (ref 0.0–7.0)
Eosinophils Absolute: 0.3 10*3/uL (ref 0.0–0.5)
HEMATOCRIT: 21.9 % — AB (ref 34.8–46.6)
HGB: 7.1 g/dL — ABNORMAL LOW (ref 11.6–15.9)
LYMPH%: 5.9 % — AB (ref 14.0–49.7)
MCH: 26.5 pg (ref 25.1–34.0)
MCHC: 32.6 g/dL (ref 31.5–36.0)
MCV: 81.3 fL (ref 79.5–101.0)
MONO#: 0.8 10*3/uL (ref 0.1–0.9)
MONO%: 7 % (ref 0.0–14.0)
NEUT#: 9 10*3/uL — ABNORMAL HIGH (ref 1.5–6.5)
NEUT%: 83.7 % — AB (ref 38.4–76.8)
Platelets: 500 10*3/uL — ABNORMAL HIGH (ref 145–400)
RBC: 2.7 10*6/uL — ABNORMAL LOW (ref 3.70–5.45)
RDW: 15.2 % — ABNORMAL HIGH (ref 11.2–14.5)
WBC: 10.8 10*3/uL — ABNORMAL HIGH (ref 3.9–10.3)
lymph#: 0.6 10*3/uL — ABNORMAL LOW (ref 0.9–3.3)
nRBC: 0 % (ref 0–0)

## 2016-12-25 MED ORDER — DEXAMETHASONE SODIUM PHOSPHATE 10 MG/ML IJ SOLN
INTRAMUSCULAR | Status: AC
  Filled 2016-12-25: qty 1

## 2016-12-25 MED ORDER — DIPHENHYDRAMINE HCL 50 MG/ML IJ SOLN
50.0000 mg | Freq: Once | INTRAMUSCULAR | Status: AC
  Administered 2016-12-25: 50 mg via INTRAVENOUS

## 2016-12-25 MED ORDER — ACETAMINOPHEN 500 MG PO TABS
ORAL_TABLET | ORAL | Status: AC
  Filled 2016-12-25: qty 2

## 2016-12-25 MED ORDER — DIPHENHYDRAMINE HCL 50 MG/ML IJ SOLN
INTRAMUSCULAR | Status: AC
  Filled 2016-12-25: qty 1

## 2016-12-25 MED ORDER — HYDROCODONE-ACETAMINOPHEN 5-325 MG PO TABS: 1.0000 | ORAL_TABLET | Freq: Four times a day (QID) | ORAL | 0 refills | Status: DC | PRN

## 2016-12-25 MED ORDER — SODIUM CHLORIDE 0.9% FLUSH
10.0000 mL | INTRAVENOUS | Status: AC | PRN
  Administered 2016-12-25: 10 mL
  Filled 2016-12-25: qty 10

## 2016-12-25 MED ORDER — ACETAMINOPHEN 500 MG PO TABS
1000.0000 mg | ORAL_TABLET | Freq: Once | ORAL | Status: AC
  Administered 2016-12-25: 1000 mg via ORAL

## 2016-12-25 MED ORDER — FAMOTIDINE IN NACL 20-0.9 MG/50ML-% IV SOLN
20.0000 mg | Freq: Once | INTRAVENOUS | Status: AC
  Administered 2016-12-25: 20 mg via INTRAVENOUS

## 2016-12-25 MED ORDER — HEPARIN SOD (PORK) LOCK FLUSH 100 UNIT/ML IV SOLN
250.0000 [IU] | Freq: Once | INTRAVENOUS | Status: AC | PRN
  Administered 2016-12-25: 250 [IU]
  Filled 2016-12-25: qty 5

## 2016-12-25 MED ORDER — PACLITAXEL CHEMO INJECTION 300 MG/50ML
175.0000 mg/m2 | Freq: Once | INTRAVENOUS | Status: AC
  Administered 2016-12-25: 270 mg via INTRAVENOUS
  Filled 2016-12-25: qty 45

## 2016-12-25 MED ORDER — DEXAMETHASONE SODIUM PHOSPHATE 10 MG/ML IJ SOLN
10.0000 mg | Freq: Once | INTRAMUSCULAR | Status: AC
  Administered 2016-12-25: 10 mg via INTRAVENOUS

## 2016-12-25 MED ORDER — FAMOTIDINE IN NACL 20-0.9 MG/50ML-% IV SOLN
INTRAVENOUS | Status: AC
  Filled 2016-12-25: qty 50

## 2016-12-25 MED ORDER — SODIUM CHLORIDE 0.9% FLUSH
10.0000 mL | Freq: Once | INTRAVENOUS | Status: AC
  Administered 2016-12-25: 10 mL
  Filled 2016-12-25: qty 10

## 2016-12-25 MED ORDER — SODIUM CHLORIDE 0.9 % IV SOLN
Freq: Once | INTRAVENOUS | Status: AC
  Administered 2016-12-25: 13:00:00 via INTRAVENOUS

## 2016-12-25 NOTE — Progress Notes (Signed)
Consult to [facility] care management entered in epic. Called to verify and confirm referral request with [facility].

## 2016-12-25 NOTE — Progress Notes (Signed)
Patient Care Team: Center, [facility] as PCP - General  DIAGNOSIS:  Encounter Diagnosis  Name Primary?  . Malignant neoplasm of upper-outer quadrant of left breast in female, estrogen receptor positive ([place])     SUMMARY OF ONCOLOGIC HISTORY:   Breast cancer of upper-outer quadrant of left female breast ([place])   09/26/2015 Mammogram    Left breast 2:00 position suspicious mass 3.1 x 1.8 x 2.8 cm, indeterminate group of calcifications UIQ left breast needs stereotactic biopsy      10/22/2015 Initial Diagnosis    Left breast biopsy UOQ: Invasive adenocarcinoma, grade 2-3, EF 20%, PR 0%, Ki-67 30%, left breast UIQ: Fibroadenoma with extensive calcifications       Miscellaneous    Patient decided not to get any interventions and was lost to follow-up      05/06/2016 Imaging    CT chest: Heterogeneous necrotic mass left chest wall and UOQ 6.2 cm extending into axilla, ext into pectoralis major and to skin multiple enlarged left axillary LN, multiple more than 15 lung nodules, 1.5 cm lesion right lobe of the liver      09/25/2016 - 09/27/2016 Hospital Admission    Breast abscess (fungating tumor) 12.5 cm with axillary LN mets, mets to lungs      11/18/2016 -  Chemotherapy    Taxol every 3 weeks palliative chemotherapy        CHIEF COMPLIANT: Cycle 2 Taxol  INTERVAL HISTORY: Ann Oliver is a 61 year old lady with very large fungating necrotic mass in the left chest wall with them multiple enlarged lymph nodes who is here on palliative chemotherapy with Taxol. She also has metastatic disease to the lungs. She received one cycle of Taxol and did extremely well. For some reason she decided not to show up for multiple visits and later told me that she feels that her cancer has been cured in spite of this very large fungating tumor that is sticking out of the chest. We decided to hold off and she called [place] back saying she would like to resume her chemotherapy. She is here  today for cycle 2 of Taxol. She has been to emergency room multiple times for dressing changes. She is also being to our office multiple times to see the nurse's for dressing changes. We previously consulted home health.  REVIEW OF SYSTEMS:   Constitutional: Denies fevers, chills or abnormal weight loss Eyes: Denies blurriness of vision Ears, nose, mouth, throat, and face: Denies mucositis or sore throat Respiratory: Denies cough, dyspnea or wheezes Cardiovascular: Denies palpitation, chest discomfort Gastrointestinal:  Denies nausea, heartburn or change in bowel habits Skin: Denies abnormal skin rashes Lymphatics: Denies new lymphadenopathy or easy bruising Neurological:Denies numbness, tingling or new weaknesses Behavioral/Psych: Mood is stable, no new changes  Extremities: No lower extremity edema Breast: Large fungating foul-smelling mass in the left chest All other systems were reviewed with the patient and are negative.  I have reviewed the past medical history, past surgical history, social history and family history with the patient and they are unchanged from previous note.  ALLERGIES:  is allergic to aspirin.  MEDICATIONS:  Current Outpatient Prescriptions  Medication Sig Dispense Refill  . cephALEXin (KEFLEX) 500 MG capsule Take 1 capsule (500 mg total) by mouth 4 (four) times daily. 40 capsule 0  . doxycycline (VIBRAMYCIN) 100 MG capsule Take 1 capsule (100 mg total) by mouth 2 (two) times daily. 20 capsule 0  . feeding supplement, ENSURE ENLIVE, (ENSURE ENLIVE) LIQD Take 237 mLs  by mouth 3 (three) times daily between meals. 30 Bottle 0  . HYDROcodone-acetaminophen (NORCO/VICODIN) 5-325 MG tablet Take 1 tablet by mouth every 6 (six) hours as needed for moderate pain. 60 tablet 0  . ibuprofen (ADVIL,MOTRIN) 200 MG tablet Take 400-600 mg by mouth every 6 (six) hours as needed for mild pain.      No current facility-administered medications for this visit.     Facility-Administered Medications Ordered in Other Visits  Medication Dose Route Frequency Provider Last Rate Last Dose  . famotidine (PEPCID) IVPB 20 mg premix  20 mg Intravenous Once Nicholas Lose, MD   20 mg at 12/25/16 1338  . heparin lock flush 100 unit/mL  250 Units Intracatheter Once PRN Nicholas Lose, MD      . PACLitaxel (TAXOL) 270 mg in dextrose 5 % 250 mL chemo infusion (> 41m/m2)  175 mg/m2 (Treatment Plan Recorded) Intravenous Once GNicholas Lose MD      . sodium chloride flush (NS) 0.9 % injection 10 mL  10 mL Intracatheter PRN GNicholas Lose MD        PHYSICAL EXAMINATION: ECOG PERFORMANCE STATUS: 2 - Symptomatic, <50% confined to bed  Vitals:   12/25/16 1229  BP: (!) 146/67  Pulse: 99  Resp: 18  Temp: 98.2 F (36.8 C)   Filed Weights   12/25/16 1229  Weight: 115 lb 12.8 oz (52.5 kg)    GENERAL:alert, no distress and comfortable SKIN: skin color, texture, turgor are normal, no rashes or significant lesions EYES: normal, Conjunctiva are pink and non-injected, sclera clear OROPHARYNX:no exudate, no erythema and lips, buccal mucosa, and tongue normal  NECK: supple, thyroid normal size, non-tender, without nodularity LYMPH:  no palpable lymphadenopathy in the cervical, axillary or inguinal LUNGS: clear to auscultation and percussion with normal breathing effort HEART: regular rate & rhythm and no murmurs and no lower extremity edema ABDOMEN:abdomen soft, non-tender and normal bowel sounds MUSCULOSKELETAL:no cyanosis of digits and no clubbing  NEURO: alert & oriented x 3 with fluent speech, no focal motor/sensory deficits EXTREMITIES: No lower extremity edema  LABORATORY DATA:  I have reviewed the data as listed   Chemistry      Component Value Date/Time   NA 142 12/25/2016 1135   K 3.4 (L) 12/25/2016 1135   CL 104 11/21/2016 1248   CO2 26 12/25/2016 1135   BUN 12.7 12/25/2016 1135   CREATININE 0.8 12/25/2016 1135      Component Value Date/Time    CALCIUM 9.0 12/25/2016 1135   ALKPHOS 92 12/25/2016 1135   AST 23 12/25/2016 1135   ALT 14 12/25/2016 1135   BILITOT <0.22 12/25/2016 1135       Lab Results  Component Value Date   WBC 10.8 (H) 12/25/2016   HGB 7.1 (L) 12/25/2016   HCT 21.9 (L) 12/25/2016   MCV 81.3 12/25/2016   PLT 500 (H) 12/25/2016   NEUTROABS 9.0 (H) 12/25/2016    ASSESSMENT & PLAN:  Breast cancer of upper-outer quadrant of left female breast ([place] 09/26/2015 Left breast 2:00 position suspicious mass 3.1 x 1.8 x 2.8 cm, indeterminate group of calcifications UIQ left breast needs stereotactic biopsy Left breast biopsy UOQ 10/22/2015: Invasive adenocarcinoma, grade 2-3, EF 20%, PR 0%, Ki-67 30%, left breast UIQ: Fibroadenoma with extensive calcifications Clinical stage in May 2017: T2 N0 stage II a Clinical stage 05/08/2016: Stage IV  Hospitalization 5/5-5/7: Breast abscess Patient was lost to follow up Refused treatment previously opting for homeopathic treatments. [place]  in [place].  Plan: 1. Palliative treatment with Taxol every 3 weeks started 11/18/2016 -------------------------------------------------------------------- Current treatment: Cycle 2 Taxol Chemotherapy toxicities:  Wound infection issues: Patient is currently getting wound care at home.  But she continues to come to the emergency department as well as to her clinic for wound dressing changes. [facility]N and is working to get her more assistance at home.  Patient is a very high risk of infection.  Return to clinic in 3 weeks for cycle 3  I spent 25 minutes talking to the patient of which more than half was spent in counseling and coordination of care.  No orders of the defined types were placed in this encounter.  The patient has a good understanding of the overall plan. she agrees with it. she will call with any problems that may develop before the next visit here.   Rulon Eisenmenger, MD 12/25/16

## 2016-12-25 NOTE — Patient Instructions (Signed)
PICC Home Guide °A peripherally inserted central catheter (PICC) is a long, thin, flexible tube that is inserted into a vein in the upper arm. It is a form of intravenous (IV) access. It is considered to be a "central" line because the tip of the PICC ends in a large vein in your chest. This large vein is called the superior vena cava (SVC). The PICC tip ends in the SVC because there is a lot of blood flow in the SVC. This allows medicines and IV fluids to be quickly distributed throughout the body. The PICC is inserted using a sterile technique by a specially trained nurse or physician. After the PICC is inserted, a chest X-ray exam is done to be sure it is in the correct place. °A PICC may be placed for different reasons, such as: °· To give medicines and liquid nutrition that can only be given through a central line. Examples are: °? Certain antibiotic treatments. °? Chemotherapy. °? Total parenteral nutrition (TPN). °· To take frequent blood samples. °· To give IV fluids and blood products. °· If there is difficulty placing a peripheral intravenous (PIV) catheter. ° °If taken care of properly, a PICC can remain in place for several months. A PICC can also allow a person to go home from the hospital early. Medicine and PICC care can be managed at home by a family member or home health care team. °What problems can happen when I have a PICC? °Problems with a PICC can occasionally occur. These may include the following: °· A blood clot (thrombus) forming in or at the tip of the PICC. This can cause the PICC to become clogged. A clot-dissolving medicine called tissue plasminogen activator (tPA) can be given through the PICC to help break up the clot. °· Inflammation of the vein (phlebitis) in which the PICC is placed. Signs of inflammation may include redness, pain at the insertion site, red streaks, or being able to feel a "cord" in the vein where the PICC is located. °· Infection in the PICC or at the insertion  site. Signs of infection may include fever, chills, redness, swelling, or pus drainage from the PICC insertion site. °· PICC movement (malposition). The PICC tip may move from its original position due to excessive physical activity, forceful coughing, sneezing, or vomiting. °· A break or cut in the PICC. It is important to not use scissors near the PICC. °· Nerve or tendon irritation or injury during PICC insertion. ° °What should I keep in mind about activities when I have a PICC? °· You may bend your arm and move it freely. If your PICC is near or at the bend of your elbow, avoid activity with repeated motion at the elbow. °· Rest at home for the remainder of the day following PICC line insertion. °· Avoid lifting heavy objects as instructed by your health care provider. °· Avoid using a crutch with the arm on the same side as your PICC. You may need to use a walker. °What should I know about my PICC dressing? °· Keep your PICC bandage (dressing) clean and dry to prevent infection. °? Ask your health care provider when you may shower. Ask your health care provider to teach you how to wrap the PICC when you do take a shower. °· Change the PICC dressing as instructed by your health care provider. °· Change your PICC dressing if it becomes loose or wet. °What should I know about PICC care? °· Check the PICC insertion   site daily for leakage, redness, swelling, or pain. °· Do not take a bath, swim, or use hot tubs when you have a PICC. Cover PICC line with clear plastic wrap and tape to keep it dry while showering. °· Flush the PICC as directed by your health care provider. Let your health care provider know right away if the PICC is difficult to flush or does not flush. Do not use force to flush the PICC. °· Do not use a syringe that is less than 10 mL to flush the PICC. °· Never pull or tug on the PICC. °· Avoid blood pressure checks on the arm with the PICC. °· Keep your PICC identification card with you at all  times. °· Do not take the PICC out yourself. Only a trained clinical professional should remove the PICC. °Get help right away if: °· Your PICC is accidentally pulled all the way out. If this happens, cover the insertion site with a bandage or gauze dressing. Do not throw the PICC away. Your health care provider will need to inspect it. °· Your PICC was tugged or pulled and has partially come out. Do not  push the PICC back in. °· There is any type of drainage, redness, or swelling where the PICC enters the skin. °· You cannot flush the PICC, it is difficult to flush, or the PICC leaks around the insertion site when it is flushed. °· You hear a "flushing" sound when the PICC is flushed. °· You have pain, discomfort, or numbness in your arm, shoulder, or jaw on the same side as the PICC. °· You feel your heart "racing" or skipping beats. °· You notice a hole or tear in the PICC. °· You develop chills or a fever. °This information is not intended to replace advice given to you by your health care provider. Make sure you discuss any questions you have with your health care provider. °Document Released: 11/15/2002 Document Revised: 11/29/2015 Document Reviewed: 03/03/2013 °Elsevier Interactive Patient Education © 2017 [employer] ° °

## 2016-12-25 NOTE — Progress Notes (Unsigned)
Pt was running to bathroom and tripped over her IV pole and hit her knee on the IV pole. No scrapes or bleeding. States her right knee is sore. Ice pack applied to knee and Dr Alvy Bimler notified.

## 2016-12-25 NOTE — Progress Notes (Signed)
Per Dr.Gudena, pt hg 7.1. Pt refused blood transfusion at this time. Okay to continue treatment with current labs. Pharmacy, lab, and infusion RN aware. Recheck blood work at next visit.

## 2016-12-25 NOTE — Assessment & Plan Note (Signed)
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  Plan: 1. Palliative treatment with Taxol every 3 weeks started 11/18/2016 -------------------------------------------------------------------- Current treatment: Cycle 2 Taxol Chemotherapy toxicities:  Wound infection issues: Patient is currently getting wound care at home.  But she continues to come to the emergency department as well as to her clinic for wound dressing changes. Return to clinic in 3 weeks for cycle 3

## 2016-12-25 NOTE — Patient Instructions (Signed)
[facility] Discharge Instructions for Patients Receiving Chemotherapy  Today you received the following chemotherapy agents Taxol   To help prevent nausea and vomiting after your treatment, we encourage you to take your nausea medication as directed.   If you develop nausea and vomiting that is not controlled by your nausea medication, call the clinic.   BELOW ARE SYMPTOMS THAT SHOULD BE REPORTED IMMEDIATELY:  *FEVER GREATER THAN 100.5 F  *CHILLS WITH OR WITHOUT FEVER  NAUSEA AND VOMITING THAT IS NOT CONTROLLED WITH YOUR NAUSEA MEDICATION  *UNUSUAL SHORTNESS OF BREATH  *UNUSUAL BRUISING OR BLEEDING  TENDERNESS IN MOUTH AND THROAT WITH OR WITHOUT PRESENCE OF ULCERS  *URINARY PROBLEMS  *BOWEL PROBLEMS  UNUSUAL RASH Items with * indicate a potential emergency and should be followed up as soon as possible.  Feel free to call the clinic you have any questions or concerns. The clinic phone number is [phone].  Please show the CHEMO ALERT CARD at check-in to the Emergency Department and triage nurse.   

## 2016-12-25 NOTE — Progress Notes (Unsigned)
Patient went to restroom during infusion. Patient noted blood on shirt and dripping on floor. Bandage on left breast wound saturated with blood. Removed existing bandage. Noted multiple small areas of active oozing. Direct pressure applied and hemostasis achieved. Wound dressed with with wet-to-dry dressing and secured with cloth tape. Patient tolerated procedure well.

## 2016-12-25 NOTE — Progress Notes (Signed)
[place] Work  Clinical Social Work was referred by ongoing pt needs. Pt continues to use ED for wound care and has had referrals to [facility] and wound care center. We have set her up with [facility] and the wound center to help with this, but she is very forgetful and has trouble keeping up with appts and plans for follow up. Clinical Social Worker educated pt about additional support through [facility] to offer support and assess for needs at home. Pt is open to this additional support and CSW requested referral to be completed through RN for [name]. CSW also inquired about CNA option through [facility] and pt is declining this service currently and feels she is "managing" currently. CSW also assisted pt with a can opener today as she was provided with a bag of food earlier in the week, but did not have a way to open the cans currently. Pt aware she can request additional bag as needed. CSW team to follow closley.   Clinical Social Work interventions:  Engineer, site and support  Loren Racer, LCSW, OSW-C Clinical Social Worker [place]  [place] Phone: [phone] Fax: [phone]

## 2016-12-27 ENCOUNTER — Encounter (HOSPITAL_COMMUNITY): Payer: Self-pay | Admitting: Nurse Practitioner

## 2016-12-27 ENCOUNTER — Emergency Department (HOSPITAL_COMMUNITY)
Admission: EM | Admit: 2016-12-27 | Discharge: 2016-12-27 | Disposition: A | Discharge status: Home / Self Care | Payer: Medicaid Other | Attending: Emergency Medicine | Admitting: Emergency Medicine

## 2016-12-27 DIAGNOSIS — C50412 Malignant neoplasm of upper-outer quadrant of left female breast: Secondary | ICD-10-CM | POA: Insufficient documentation

## 2016-12-27 DIAGNOSIS — Z79899 Other long term (current) drug therapy: Secondary | ICD-10-CM | POA: Diagnosis not present

## 2016-12-27 DIAGNOSIS — Z4800 Encounter for change or removal of nonsurgical wound dressing: Secondary | ICD-10-CM | POA: Diagnosis not present

## 2016-12-27 DIAGNOSIS — C7801 Secondary malignant neoplasm of right lung: Secondary | ICD-10-CM | POA: Diagnosis not present

## 2016-12-27 DIAGNOSIS — N644 Mastodynia: Secondary | ICD-10-CM | POA: Diagnosis present

## 2016-12-27 MED ORDER — OXYCODONE-ACETAMINOPHEN 5-325 MG PO TABS: 1.0000 | ORAL_TABLET | Freq: Four times a day (QID) | ORAL | 0 refills | Status: DC | PRN

## 2016-12-27 NOTE — ED Provider Notes (Signed)
[facility] DEPT Provider Note   CSN: [id number] Arrival date & time: 12/27/16  1532     History   Chief Complaint Chief Complaint  Patient presents with  . Breast Pain    HPI Ann Oliver is a 61 y.o. female.  HPI  The patient is a 61 year old female that presents for a dressing change to her left breast. She has been known to have metastatic cancer which has caused a large fungating mass on the left chest wall. This is currently being treated with palliative chemotherapy only. The patient has ongoing chronic pain in this area, she does not currently have any fevers though she has chronic drainage and is being treated chronically for infection. She is currently on 2 different antibiotics. The patient was recently told that she had anemia but she declined a blood transfusion at that time. When I ask her if she wants a blood transfusion today she also states that she would rather follow up with her doctor at the appointment she has tomorrow to get further evaluation  Past Medical History:  Diagnosis Date  . Cancer ([place])   . Sinusitis   . Tumor cells     Patient Active Problem List   Diagnosis Date Noted  . Port catheter in place 12/02/2016  . Necrotizing soft tissue infection 11/21/2016  . Cellulitis 10/28/2016  . Wound infection 10/28/2016  . Cellulitis of left breast 10/28/2016  . Encounter for palliative care   . Goals of care, counseling/discussion   . Breast cancer metastasized to lung, right ([place]) 09/25/2016  . Malnutrition of moderate degree 09/25/2016  . Breast cancer of upper-outer quadrant of left female breast ([place]) 10/22/2015  . THYROID NODULE, RIGHT 05/31/2007  . DEPRESSION 05/31/2007  . GERD 05/31/2007  . HOT FLASHES 05/31/2007  . HEADACHE 05/31/2007    Past Surgical History:  Procedure Laterality Date  . BIOPSY BREAST    . IR FLUORO GUIDE CV LINE RIGHT  11/06/2016  . IR US GUIDE VASC ACCESS RIGHT  11/06/2016    OB History    No data available         Home Medications    Prior to Admission medications   Medication Sig Start Date End Date Taking? Authorizing Provider  cephALEXin (KEFLEX) 500 MG capsule Take 1 capsule (500 mg total) by mouth 4 (four) times daily. 11/22/16   Patrecia Pour, MD  doxycycline (VIBRAMYCIN) 100 MG capsule Take 1 capsule (100 mg total) by mouth 2 (two) times daily. 11/22/16   Patrecia Pour, MD  feeding supplement, ENSURE ENLIVE, (ENSURE ENLIVE) LIQD Take 237 mLs by mouth 3 (three) times daily between meals. 09/26/16   [name], MD  HYDROcodone-acetaminophen (NORCO/VICODIN) 5-325 MG tablet Take 1 tablet by mouth every 6 (six) hours as needed for moderate pain. 12/25/16   Heath Lark, MD  ibuprofen (ADVIL,MOTRIN) 200 MG tablet Take 400-600 mg by mouth every 6 (six) hours as needed for mild pain.     [provider]    Family History Family History  Problem Relation Age of Onset  . Breast cancer Neg Hx     Social History Social History  Substance Use Topics  . Smoking status: Never Smoker  . Smokeless tobacco: Never Used  . Alcohol use No     Allergies   Aspirin   Review of Systems Review of Systems  Constitutional: Negative for fatigue and fever.  Respiratory: Negative for cough and shortness of breath.   Cardiovascular:  Chest wall pain  Gastrointestinal: Negative for diarrhea, nausea and vomiting.  Skin: Positive for wound.     Physical Exam Updated Vital Signs BP 127/71   Pulse (!) 103   Temp 98.9 F (37.2 C) (Oral)   Resp 18   SpO2 99%   Physical Exam  Constitutional: She appears well-developed and well-nourished. No distress.  HENT:  Head: Normocephalic and atraumatic.  Mouth/Throat: Oropharynx is clear and moist. No oropharyngeal exudate.  Eyes: Pupils are equal, round, and reactive to light. EOM are normal. Right eye exhibits no discharge. Left eye exhibits no discharge. No scleral icterus.  Pale conjunctiva  Neck: Normal range of motion. Neck supple. No  JVD present. No thyromegaly present.  Cardiovascular: Normal rate, regular rhythm, normal heart sounds and intact distal pulses.  Exam reveals no gallop and no friction rub.   No murmur heard. Tachycardic to 101 bpm, normal pulses, no edema, no JVD  Pulmonary/Chest: Effort normal and breath sounds normal. No respiratory distress. She has no wheezes. She has no rales.  Large fungating foul-smelling chest wound, fungating mass to the left side of the chest. Does not appear to be actively bleeding but has purulent foul-smelling material on the surface of the wound. Otherwise lung sounds are clear and in no distress  Abdominal: Soft. Bowel sounds are normal. She exhibits no distension and no mass. There is no tenderness.  Musculoskeletal: Normal range of motion. She exhibits no edema or tenderness.  Lymphadenopathy:    She has no cervical adenopathy.  Neurological: She is alert. Coordination normal.  Skin: Skin is warm and dry. No rash noted. No erythema.  Psychiatric: She has a normal mood and affect. Her behavior is normal.  Nursing note and vitals reviewed.    ED Treatments / Results  Labs (all labs ordered are listed, but only abnormal results are displayed) Labs Reviewed - No data to display   Radiology No results found.  Procedures Procedures (including critical care time)  Medications Ordered in ED Medications - No data to display   Initial Impression / Assessment and Plan / ED Course  I have reviewed the triage vital signs and the nursing notes.  Pertinent labs & imaging results that were available during my care of the patient were reviewed by me and considered in my medical decision making (see chart for details).     The patient has ongoing pain and ongoing infection to this wound. She is on antibiotics, she requested dressing change, she declines transfusion, further blood work not indicated at this time as she does not want to have any interventions for that today.  She will be given a small mount of pain medication for home. She has follow-up tomorrow with the oncology clinic according to the patient's report  I have personally change the patient's dressing, she did have a focal area of hemorrhage underneath the mass on the inferior aspect. A small piece of gauze was held with pressure and the bleeding stopped abruptly. Quick clot was placed, overlying gauze, overlying ABD dressing, overlying Kerlix, the patient has tolerated this and will be discharged with a small amount of pain medication. She has a follow-up tomorrow  Final Clinical Impressions(s) / ED Diagnoses   Final diagnoses:  None    New Prescriptions New Prescriptions   No medications on file     Noemi Chapel, MD 12/27/16 [phone]

## 2016-12-27 NOTE — Discharge Instructions (Signed)
Please see your cancer doctor tomorrow for follow-up. Please keep your dressing in place until you follow-up. If your bleeding heavily return to the emergency department for a wound check.

## 2016-12-27 NOTE — ED Triage Notes (Signed)
Pt presents with c/o left breast pain. She is currently undergoing chemotherapy at [facility] for left breast cancer. She reports a sore to this breast that has begun to have a foul drainage this week. She has also had increasing pain this week. She took ibuprofen prior to arrival with minimal relief

## 2016-12-27 NOTE — ED Notes (Signed)
Pt verbalized understanding discharge instructions and denies any further needs or questions at this time. VS stable, ambulatory and steady gait.   

## 2016-12-28 ENCOUNTER — Telehealth: Payer: Self-pay | Admitting: Hematology and Oncology

## 2016-12-28 ENCOUNTER — Telehealth: Payer: Self-pay

## 2016-12-28 ENCOUNTER — Other Ambulatory Visit: Payer: Self-pay

## 2016-12-28 DIAGNOSIS — C50412 Malignant neoplasm of upper-outer quadrant of left female breast: Secondary | ICD-10-CM

## 2016-12-28 DIAGNOSIS — Z170 Estrogen receptor positive status [ER+]: Principal | ICD-10-CM

## 2016-12-28 NOTE — Telephone Encounter (Signed)
[place] services and transportation. Set up pt for transportation 3x per week. Notified patient and is aware.

## 2016-12-28 NOTE — Telephone Encounter (Signed)
sw pt to confirm added Monday, Wednesday, and Friday appts per sch msg. Asked pt if transportation is arranged for today's visit and pt stated she will have to try for transportation for Wed . Gave pt Wed and Fri appt date/time; cannot make today's appt. [facility] to arrange a ride for pt appts and waited for someone to answer the line for 10 mins

## 2016-12-29 ENCOUNTER — Other Ambulatory Visit: Payer: Self-pay

## 2016-12-29 ENCOUNTER — Ambulatory Visit: Payer: Medicaid Other

## 2016-12-29 ENCOUNTER — Encounter (HOSPITAL_COMMUNITY): Payer: Self-pay | Admitting: Emergency Medicine

## 2016-12-29 ENCOUNTER — Emergency Department (HOSPITAL_COMMUNITY)
Admission: EM | Admit: 2016-12-29 | Discharge: 2016-12-29 | Disposition: A | Discharge status: Home / Self Care | Payer: Medicaid Other | Attending: Emergency Medicine | Admitting: Emergency Medicine

## 2016-12-29 ENCOUNTER — Other Ambulatory Visit (HOSPITAL_BASED_OUTPATIENT_CLINIC_OR_DEPARTMENT_OTHER): Payer: Medicaid Other

## 2016-12-29 ENCOUNTER — Ambulatory Visit (HOSPITAL_BASED_OUTPATIENT_CLINIC_OR_DEPARTMENT_OTHER): Payer: Medicaid Other

## 2016-12-29 ENCOUNTER — Ambulatory Visit (HOSPITAL_COMMUNITY)
Admission: RE | Admit: 2016-12-29 | Discharge: 2016-12-29 | Disposition: A | Discharge status: Home / Self Care | Payer: Medicaid Other | Source: Ambulatory Visit | Attending: Hematology and Oncology | Admitting: Hematology and Oncology

## 2016-12-29 DIAGNOSIS — C50412 Malignant neoplasm of upper-outer quadrant of left female breast: Secondary | ICD-10-CM | POA: Diagnosis not present

## 2016-12-29 DIAGNOSIS — X58XXXA Exposure to other specified factors, initial encounter: Secondary | ICD-10-CM | POA: Insufficient documentation

## 2016-12-29 DIAGNOSIS — D649 Anemia, unspecified: Secondary | ICD-10-CM | POA: Insufficient documentation

## 2016-12-29 DIAGNOSIS — C7800 Secondary malignant neoplasm of unspecified lung: Secondary | ICD-10-CM

## 2016-12-29 DIAGNOSIS — Z79899 Other long term (current) drug therapy: Secondary | ICD-10-CM | POA: Insufficient documentation

## 2016-12-29 DIAGNOSIS — Z170 Estrogen receptor positive status [ER+]: Principal | ICD-10-CM

## 2016-12-29 DIAGNOSIS — S21102D Unspecified open wound of left front wall of thorax without penetration into thoracic cavity, subsequent encounter: Secondary | ICD-10-CM | POA: Insufficient documentation

## 2016-12-29 DIAGNOSIS — D0582 Other specified type of carcinoma in situ of left breast: Secondary | ICD-10-CM | POA: Diagnosis not present

## 2016-12-29 DIAGNOSIS — Z95828 Presence of other vascular implants and grafts: Secondary | ICD-10-CM

## 2016-12-29 DIAGNOSIS — C50911 Malignant neoplasm of unspecified site of right female breast: Secondary | ICD-10-CM

## 2016-12-29 DIAGNOSIS — E876 Hypokalemia: Secondary | ICD-10-CM

## 2016-12-29 LAB — COMPREHENSIVE METABOLIC PANEL
ALBUMIN: 2.9 g/dL — AB (ref 3.5–5.0)
ALK PHOS: 82 U/L (ref 40–150)
ALT: 15 U/L (ref 0–55)
ALT: 15 U/L (ref 14–54)
ANION GAP: 11 meq/L (ref 3–11)
AST: 19 U/L (ref 5–34)
AST: 22 U/L (ref 15–41)
Albumin: 3.1 g/dL — ABNORMAL LOW (ref 3.5–5.0)
Alkaline Phosphatase: 75 U/L (ref 38–126)
Anion gap: 9 (ref 5–15)
BILIRUBIN TOTAL: 0.25 mg/dL (ref 0.20–1.20)
BILIRUBIN TOTAL: 0.5 mg/dL (ref 0.3–1.2)
BUN: 13.1 mg/dL (ref 7.0–26.0)
BUN: 13 mg/dL (ref 6–20)
CO2: 24 meq/L (ref 22–29)
CO2: 25 mmol/L (ref 22–32)
CREATININE: 0.8 mg/dL (ref 0.6–1.1)
CREATININE: 0.63 mg/dL (ref 0.44–1.00)
Calcium: 9.2 mg/dL (ref 8.4–10.4)
Calcium: 8.8 mg/dL — ABNORMAL LOW (ref 8.9–10.3)
Chloride: 104 mEq/L (ref 98–109)
Chloride: 105 mmol/L (ref 101–111)
EGFR: 87 mL/min/{1.73_m2} — ABNORMAL LOW (ref >90)
GFR calc Af Amer: >60 mL/min (ref >60)
GLUCOSE: 182 mg/dL — AB (ref 70–140)
Glucose, Bld: 105 mg/dL — ABNORMAL HIGH (ref 65–99)
POTASSIUM: 3.4 mmol/L — AB (ref 3.5–5.1)
Potassium: 3.2 mEq/L — ABNORMAL LOW (ref 3.5–5.1)
Sodium: 139 mEq/L (ref 136–145)
Sodium: 139 mmol/L (ref 135–145)
TOTAL PROTEIN: 6.5 g/dL (ref 6.4–8.3)
TOTAL PROTEIN: 6.5 g/dL (ref 6.5–8.1)

## 2016-12-29 LAB — CBC
HCT: 27.3 % — ABNORMAL LOW (ref 36.0–46.0)
Hemoglobin: 8.8 g/dL — ABNORMAL LOW (ref 12.0–15.0)
MCH: 26.7 pg (ref 26.0–34.0)
MCHC: 32.2 g/dL (ref 30.0–36.0)
MCV: 83 fL (ref 78.0–100.0)
PLATELETS: 512 10*3/uL — AB (ref 150–400)
RBC: 3.29 MIL/uL — ABNORMAL LOW (ref 3.87–5.11)
RDW: 15 % (ref 11.5–15.5)
WBC: 9.6 10*3/uL (ref 4.0–10.5)

## 2016-12-29 LAB — CBC WITH DIFFERENTIAL/PLATELET
BASO%: 0.2 % (ref 0.0–2.0)
Basophils Absolute: 0 10*3/uL (ref 0.0–0.1)
EOS ABS: 0.1 10*3/uL (ref 0.0–0.5)
EOS%: 1 % (ref 0.0–7.0)
HCT: 21.2 % — ABNORMAL LOW (ref 34.8–46.6)
HEMOGLOBIN: 6.8 g/dL — AB (ref 11.6–15.9)
LYMPH#: 0.4 10*3/uL — AB (ref 0.9–3.3)
LYMPH%: 3.5 % — AB (ref 14.0–49.7)
MCH: 25.9 pg (ref 25.1–34.0)
MCHC: 32.2 g/dL (ref 31.5–36.0)
MCV: 80.5 fL (ref 79.5–101.0)
MONO#: 0.1 10*3/uL (ref 0.1–0.9)
MONO%: 1.4 % (ref 0.0–14.0)
NEUT#: 9.6 10*3/uL — ABNORMAL HIGH (ref 1.5–6.5)
NEUT%: 93.9 % — ABNORMAL HIGH (ref 38.4–76.8)
PLATELETS: 500 10*3/uL — AB (ref 145–400)
RBC: 2.64 10*6/uL — AB (ref 3.70–5.45)
RDW: 15.5 % — AB (ref 11.2–14.5)
WBC: 10.2 10*3/uL (ref 3.9–10.3)

## 2016-12-29 LAB — PREPARE RBC (CROSSMATCH)

## 2016-12-29 LAB — BPAM RBC
Blood Product Expiration Date: 201808292359
UNIT TYPE AND RH: 6200

## 2016-12-29 LAB — TYPE AND SCREEN
ABO/RH(D): A POS
ANTIBODY SCREEN: NEGATIVE
Unit division: 0

## 2016-12-29 LAB — ABO/RH: ABO/RH(D): A POS

## 2016-12-29 MED ORDER — CLINDAMYCIN HCL 300 MG PO CAPS: 300.0000 mg | ORAL_CAPSULE | Freq: Three times a day (TID) | ORAL | 0 refills | Status: AC

## 2016-12-29 MED ORDER — OXYCODONE-ACETAMINOPHEN 5-325 MG PO TABS
1.0000 | ORAL_TABLET | Freq: Once | ORAL | Status: AC
  Administered 2016-12-29: 1 via ORAL
  Filled 2016-12-29 (×2): qty 1

## 2016-12-29 MED ORDER — OXYCODONE-ACETAMINOPHEN 5-325 MG PO TABS
1.0000 | ORAL_TABLET | Freq: Once | ORAL | Status: AC
  Administered 2016-12-29: 2 via ORAL

## 2016-12-29 MED ORDER — POTASSIUM CHLORIDE 10 MEQ/100ML IV SOLN
10.0000 meq | Freq: Once | INTRAVENOUS | Status: DC
Start: 2016-12-29 — End: 2016-12-29

## 2016-12-29 MED ORDER — OXYCODONE-ACETAMINOPHEN 5-325 MG PO TABS: 1.0000 | ORAL_TABLET | Freq: Four times a day (QID) | ORAL | 0 refills | Status: DC | PRN

## 2016-12-29 MED ORDER — ACETAMINOPHEN 325 MG PO TABS
650.0000 mg | ORAL_TABLET | Freq: Once | ORAL | Status: AC
  Administered 2016-12-29: 650 mg via ORAL

## 2016-12-29 MED ORDER — SODIUM CHLORIDE 0.9 % IV SOLN
Freq: Once | INTRAVENOUS | Status: AC
  Administered 2016-12-29: 16:00:00 via INTRAVENOUS
  Filled 2016-12-29: qty 100

## 2016-12-29 MED ORDER — OXYCODONE-ACETAMINOPHEN 5-325 MG PO TABS
ORAL_TABLET | ORAL | Status: AC
  Filled 2016-12-29: qty 2

## 2016-12-29 MED ORDER — HEPARIN SOD (PORK) LOCK FLUSH 100 UNIT/ML IV SOLN
500.0000 [IU] | Freq: Once | INTRAVENOUS | Status: AC
  Administered 2016-12-29: 500 [IU]

## 2016-12-29 MED ORDER — HEPARIN SOD (PORK) LOCK FLUSH 100 UNIT/ML IV SOLN
INTRAVENOUS | Status: AC
  Filled 2016-12-29: qty 5

## 2016-12-29 MED ORDER — SODIUM CHLORIDE 0.9% FLUSH
10.0000 mL | Freq: Once | INTRAVENOUS | Status: AC
  Administered 2016-12-29: 10 mL
  Filled 2016-12-29: qty 10

## 2016-12-29 MED ORDER — HYDROMORPHONE HCL 1 MG/ML IJ SOLN
1.0000 mg | Freq: Once | INTRAMUSCULAR | Status: AC
  Administered 2016-12-29: 1 mg via INTRAVENOUS
  Filled 2016-12-29: qty 1

## 2016-12-29 MED ORDER — POTASSIUM CHLORIDE CRYS ER 20 MEQ PO TBCR: 20.0000 meq | EXTENDED_RELEASE_TABLET | Freq: Two times a day (BID) | ORAL | 0 refills | Status: DC

## 2016-12-29 MED ORDER — ACETAMINOPHEN 325 MG PO TABS
ORAL_TABLET | ORAL | Status: AC
  Filled 2016-12-29: qty 2

## 2016-12-29 MED ORDER — DIPHENHYDRAMINE HCL 25 MG PO CAPS
ORAL_CAPSULE | ORAL | Status: AC
  Filled 2016-12-29: qty 1

## 2016-12-29 MED ORDER — DIPHENHYDRAMINE HCL 25 MG PO CAPS
25.0000 mg | ORAL_CAPSULE | Freq: Once | ORAL | Status: AC
  Administered 2016-12-29: 25 mg via ORAL

## 2016-12-29 MED ORDER — HEPARIN SOD (PORK) LOCK FLUSH 100 UNIT/ML IV SOLN
250.0000 [IU] | Freq: Once | INTRAVENOUS | Status: AC
  Administered 2016-12-29: 250 [IU]
  Filled 2016-12-29: qty 5

## 2016-12-29 MED ORDER — SODIUM CHLORIDE 0.9 % IV SOLN
250.0000 mL | Freq: Once | INTRAVENOUS | Status: AC
  Administered 2016-12-29: 250 mL via INTRAVENOUS

## 2016-12-29 MED ORDER — ONDANSETRON HCL 8 MG PO TABS: 8.0000 mg | ORAL_TABLET | Freq: Three times a day (TID) | ORAL | 0 refills | Status: DC | PRN

## 2016-12-29 MED ORDER — CIPROFLOXACIN HCL 500 MG PO TABS: 500.0000 mg | ORAL_TABLET | Freq: Two times a day (BID) | ORAL | 0 refills | Status: AC

## 2016-12-29 MED FILL — ONDANSETRON HCL 8 MG TAB: 8 | 6 days supply | Qty: 20 | Fill #0

## 2016-12-29 NOTE — ED Notes (Signed)
PICC accessed and flushed for blood per order by provider verbal

## 2016-12-29 NOTE — Addendum Note (Signed)
Addended by: Carolynne Edouard B on: 12/29/2016 01:31 PM   Modules accepted: Orders

## 2016-12-29 NOTE — ED Triage Notes (Signed)
Pt c/o bleeding from left side from cancer site, had to have blood transfusion today due to low hemoglobin. Last chemo received last week no radiation.

## 2016-12-29 NOTE — Progress Notes (Addendum)
Pt unable to come to her flush appointment yesterday and showed up and walked in the cancer center this morning. Added lab/flush today. Contacted [place] social services and transportation to set up transportation appointments for the next 2 weeks. Given time/dates to patient and notified her that she will be receiving a phone call from transportation the day before each scheduled cancer center appt. Pt verbalized understanding. Pt feeling very tired and nauseated today. Told pt to not leave the cancer center until provider reviews all lab work from today. We will need to determine if pt will need blood transfusions today. Pt verbalized understanding. Pt reports that she threw her pain refill script for norco away by accident while she was in her home and never got to refill her pain script. Pt states that she looked everywhere her apartment but could not locate it. Pt was in the ED and was prescribed percocet and was effective in controlling her breast pain, but had only given her 6 tablets to take. Pt is now out of medication and would like to see if Dr.Gudena would refill percocet instead of norco. Will notify Dr.Gudena today of pt request. Will also send in prescription for nausea for her to take at home. Pt verbalized understanding.

## 2016-12-29 NOTE — Progress Notes (Addendum)
Pt labs reviewed by Dr.Gudena. Will need 1 unit PRBC today. Notified infusion charge to add pt to schedule today. Obtained HAR, and entered blood orders. Notified lab, blood bank and infusion. Pt first time getting blood today and will be signing consent form. Explained benefits of blood transfusion with pt and verbalized understanding. Pt has some concerns about getting blood transfusion, since she has never had it before. Told pt that she will be closely monitored during treatment and will make sure that she is taken care of today. Pt verbalized understanding.   Pt potassium level 3.2. Will notify Dr.Gudena and obtain orders appropriately. Pt will be notified of plan.

## 2016-12-29 NOTE — Discharge Instructions (Signed)
FOR NOW, STOP TAKING THE CEPHALEXIN (KEFLEX) AND DOXYCYCLINE IF YOU STILL HAVE ANY LEFT  START TAKING THE CLINDAMYCIN AND CIPROFLOXACIN TODAY  BEFORE THESE ANTIBIOTICS RUN OUT, MAKE SURE YOU SEE YOUR DOCTOR TO DISCUSS FURTHER TREATMENT  HAVE YOUR DRESSING CHANGED AT HOME IN 24 TO 48 HOURS

## 2016-12-29 NOTE — Patient Instructions (Signed)
PICC Home Guide °A peripherally inserted central catheter (PICC) is a long, thin, flexible tube that is inserted into a vein in the upper arm. It is a form of intravenous (IV) access. It is considered to be a "central" line because the tip of the PICC ends in a large vein in your chest. This large vein is called the superior vena cava (SVC). The PICC tip ends in the SVC because there is a lot of blood flow in the SVC. This allows medicines and IV fluids to be quickly distributed throughout the body. The PICC is inserted using a sterile technique by a specially trained nurse or physician. After the PICC is inserted, a chest X-ray exam is done to be sure it is in the correct place. °A PICC may be placed for different reasons, such as: °· To give medicines and liquid nutrition that can only be given through a central line. Examples are: °? Certain antibiotic treatments. °? Chemotherapy. °? Total parenteral nutrition (TPN). °· To take frequent blood samples. °· To give IV fluids and blood products. °· If there is difficulty placing a peripheral intravenous (PIV) catheter. ° °If taken care of properly, a PICC can remain in place for several months. A PICC can also allow a person to go home from the hospital early. Medicine and PICC care can be managed at home by a family member or home health care team. °What problems can happen when I have a PICC? °Problems with a PICC can occasionally occur. These may include the following: °· A blood clot (thrombus) forming in or at the tip of the PICC. This can cause the PICC to become clogged. A clot-dissolving medicine called tissue plasminogen activator (tPA) can be given through the PICC to help break up the clot. °· Inflammation of the vein (phlebitis) in which the PICC is placed. Signs of inflammation may include redness, pain at the insertion site, red streaks, or being able to feel a "cord" in the vein where the PICC is located. °· Infection in the PICC or at the insertion  site. Signs of infection may include fever, chills, redness, swelling, or pus drainage from the PICC insertion site. °· PICC movement (malposition). The PICC tip may move from its original position due to excessive physical activity, forceful coughing, sneezing, or vomiting. °· A break or cut in the PICC. It is important to not use scissors near the PICC. °· Nerve or tendon irritation or injury during PICC insertion. ° °What should I keep in mind about activities when I have a PICC? °· You may bend your arm and move it freely. If your PICC is near or at the bend of your elbow, avoid activity with repeated motion at the elbow. °· Rest at home for the remainder of the day following PICC line insertion. °· Avoid lifting heavy objects as instructed by your health care provider. °· Avoid using a crutch with the arm on the same side as your PICC. You may need to use a walker. °What should I know about my PICC dressing? °· Keep your PICC bandage (dressing) clean and dry to prevent infection. °? Ask your health care provider when you may shower. Ask your health care provider to teach you how to wrap the PICC when you do take a shower. °· Change the PICC dressing as instructed by your health care provider. °· Change your PICC dressing if it becomes loose or wet. °What should I know about PICC care? °· Check the PICC insertion   site daily for leakage, redness, swelling, or pain. °· Do not take a bath, swim, or use hot tubs when you have a PICC. Cover PICC line with clear plastic wrap and tape to keep it dry while showering. °· Flush the PICC as directed by your health care provider. Let your health care provider know right away if the PICC is difficult to flush or does not flush. Do not use force to flush the PICC. °· Do not use a syringe that is less than 10 mL to flush the PICC. °· Never pull or tug on the PICC. °· Avoid blood pressure checks on the arm with the PICC. °· Keep your PICC identification card with you at all  times. °· Do not take the PICC out yourself. Only a trained clinical professional should remove the PICC. °Get help right away if: °· Your PICC is accidentally pulled all the way out. If this happens, cover the insertion site with a bandage or gauze dressing. Do not throw the PICC away. Your health care provider will need to inspect it. °· Your PICC was tugged or pulled and has partially come out. Do not  push the PICC back in. °· There is any type of drainage, redness, or swelling where the PICC enters the skin. °· You cannot flush the PICC, it is difficult to flush, or the PICC leaks around the insertion site when it is flushed. °· You hear a "flushing" sound when the PICC is flushed. °· You have pain, discomfort, or numbness in your arm, shoulder, or jaw on the same side as the PICC. °· You feel your heart "racing" or skipping beats. °· You notice a hole or tear in the PICC. °· You develop chills or a fever. °This information is not intended to replace advice given to you by your health care provider. Make sure you discuss any questions you have with your health care provider. °Document Released: 11/15/2002 Document Revised: 11/29/2015 Document Reviewed: 03/03/2013 °Elsevier Interactive Patient Education © 2017 [employer] ° °

## 2016-12-29 NOTE — ED Provider Notes (Signed)
[facility] DEPT Provider Note   CSN: [id number] Arrival date & time: 12/29/16  1755     History   Chief Complaint Chief Complaint  Patient presents with  . Acute Bleed  . Anemia    HPI Ann Oliver is a 61 y.o. female.  HPI   61 yo F with h/o invasive breast CA with large, fungating left breast CA/mass here for wound check. Pt states that she underwent transfusion for anemia thought 2/2 chronic bleeding of her tumor/mass this morning. She states that she last had her dressing changed on 8/5 in the ED. She did not pay much attention to the dressing during hte infusion but on returning home, she noticed that the drainage of the wound had started to have a small amount of blood in it. She has had intermittent bleeding from the wound since starting chemo. She denies any direct trauma to the wound. Denies any fever or chills. She has had constant drainage x weeks, does admit she has run out of ABX for this. Denies any more bleeding than usual but wants to be checked up to make sure her blood level is okay.  Past Medical History:  Diagnosis Date  . Cancer ([place])   . Sinusitis   . Tumor cells     Patient Active Problem List   Diagnosis Date Noted  . Port catheter in place 12/02/2016  . Necrotizing soft tissue infection 11/21/2016  . Cellulitis 10/28/2016  . Wound infection 10/28/2016  . Cellulitis of left breast 10/28/2016  . Encounter for palliative care   . Goals of care, counseling/discussion   . Breast cancer metastasized to lung, right ([place]) 09/25/2016  . Malnutrition of moderate degree 09/25/2016  . Breast cancer of upper-outer quadrant of left female breast ([place]) 10/22/2015  . THYROID NODULE, RIGHT 05/31/2007  . DEPRESSION 05/31/2007  . GERD 05/31/2007  . HOT FLASHES 05/31/2007  . HEADACHE 05/31/2007    Past Surgical History:  Procedure Laterality Date  . BIOPSY BREAST    . IR FLUORO GUIDE CV LINE RIGHT  11/06/2016  . IR US GUIDE VASC ACCESS RIGHT  11/06/2016      OB History    No data available       Home Medications    Prior to Admission medications   Medication Sig Start Date End Date Taking? Authorizing Provider  cephALEXin (KEFLEX) 500 MG capsule Take 1 capsule (500 mg total) by mouth 4 (four) times daily. 11/22/16  Yes Patrecia Pour, MD  feeding supplement, ENSURE ENLIVE, (ENSURE ENLIVE) LIQD Take 237 mLs by mouth 3 (three) times daily between meals. 09/26/16  Yes [name], MD  ibuprofen (ADVIL,MOTRIN) 200 MG tablet Take 400-600 mg by mouth every 6 (six) hours as needed for mild pain.    Yes [provider]  ondansetron (ZOFRAN) 8 MG tablet Take 1 tablet (8 mg total) by mouth every 8 (eight) hours as needed for nausea or vomiting. 12/29/16  Yes Nicholas Lose, MD  oxyCODONE-acetaminophen (PERCOCET/ROXICET) 5-325 MG tablet Take 1 tablet by mouth every 6 (six) hours as needed for severe pain. May take 2 tablets PO q 6 hours for severe pain - Do not take with Tylenol as this tablet already contains tylenol 12/29/16  Yes Nicholas Lose, MD  ciprofloxacin (CIPRO) 500 MG tablet Take 1 tablet (500 mg total) by mouth every 12 (twelve) hours. 12/29/16 01/08/17  Duffy Bruce, MD  clindamycin (CLEOCIN) 300 MG capsule Take 1 capsule (300 mg total) by mouth 3 (three) times  daily. 12/29/16 01/08/17  Duffy Bruce, MD  doxycycline (VIBRAMYCIN) 100 MG capsule Take 1 capsule (100 mg total) by mouth 2 (two) times daily. Patient not taking: Reported on 12/29/2016 11/22/16   Patrecia Pour, MD  potassium chloride SA (K-DUR,KLOR-CON) 20 MEQ tablet Take 1 tablet (20 mEq total) by mouth 2 (two) times daily. 12/29/16 01/01/17  Duffy Bruce, MD    Family History Family History  Problem Relation Age of Onset  . Breast cancer Neg Hx     Social History Social History  Substance Use Topics  . Smoking status: Never Smoker  . Smokeless tobacco: Never Used  . Alcohol use No     Allergies   Aspirin   Review of Systems Review of Systems  Constitutional:  Negative for chills, fatigue and fever.  HENT: Negative for congestion and rhinorrhea.   Eyes: Negative for visual disturbance.  Respiratory: Negative for cough, shortness of breath and wheezing.   Cardiovascular: Positive for chest pain. Negative for leg swelling.  Gastrointestinal: Negative for abdominal pain, diarrhea, nausea and vomiting.  Genitourinary: Negative for dysuria and flank pain.  Musculoskeletal: Negative for neck pain and neck stiffness.  Skin: Positive for rash and wound.  Allergic/Immunologic: Negative for immunocompromised state.  Neurological: Negative for syncope, weakness and headaches.  All other systems reviewed and are negative.    Physical Exam Updated Vital Signs BP 139/79 (BP Location: Left Arm)   Pulse 89   Temp 98.4 F (36.9 C) (Oral)   Resp 18   SpO2 100%   Physical Exam  Constitutional: She is oriented to person, place, and time. She appears well-developed and well-nourished. No distress.  Thin, malnourished  HENT:  Head: Normocephalic and atraumatic.  Eyes: Conjunctivae are normal.  Neck: Neck supple.  Cardiovascular: Normal rate, regular rhythm and normal heart sounds.  Exam reveals no friction rub.   No murmur heard. Pulmonary/Chest: Effort normal and breath sounds normal. No respiratory distress. She has no wheezes. She has no rales.  Large, fungating tumor to left chest wall/breast. The overlying skin is excoriated, ulcerated, and macerated with multiple areas of venous oozing due to skin breakdown. Foul odor to the area. There is no surrounding or palpable warmth, erythema, or fluctuance.  Abdominal: She exhibits no distension.  Musculoskeletal: She exhibits no edema.  Neurological: She is alert and oriented to person, place, and time. She exhibits normal muscle tone.  Skin: Skin is warm. Capillary refill takes less than 2 seconds.  Psychiatric: She has a normal mood and affect.  Nursing note and vitals reviewed.    ED Treatments /  Results  Labs (all labs ordered are listed, but only abnormal results are displayed) Labs Reviewed  COMPREHENSIVE METABOLIC PANEL - Abnormal; Notable for the following:       Result Value   Potassium 3.4 (*)    Glucose, Bld 105 (*)    Calcium 8.8 (*)    Albumin 3.1 (*)    All other components within normal limits  CBC - Abnormal; Notable for the following:    RBC 3.29 (*)    Hemoglobin 8.8 (*)    HCT 27.3 (*)    Platelets 512 (*)    All other components within normal limits  TYPE AND SCREEN    EKG  EKG Interpretation None       Radiology No results found.  Procedures Procedures (including critical care time)  Medications Ordered in ED Medications  HYDROmorphone (DILAUDID) injection 1 mg (1 mg Intravenous Given 12/29/16 2036)  oxyCODONE-acetaminophen (PERCOCET/ROXICET) 5-325 MG per tablet 1 tablet (1 tablet Oral Given 12/29/16 2130)  heparin lock flush 100 unit/mL (500 Units Intracatheter Given 12/29/16 2158)     Initial Impression / Assessment and Plan / ED Course  I have reviewed the triage vital signs and the nursing notes.  Pertinent labs & imaging results that were available during my care of the patient were reviewed by me and considered in my medical decision making (see chart for details).     61 yo F with large, chronic fungating breast mass of left chest wall. She is here for wound check and repeat Hgb s/p transfusion this AM. On my exam, the mass is macerated with diffuse excoriations/exposed granulation tissue and small areas of likely capillary/venous bleeding. No pulsatile bleeding. Following gentle cleansing of the mass with saline, the mass was re-dressed. I applied quickclot gauze to multiple areas of bleeding with excellent hemostasis. Following monitoring x 5-10 min, the wound was then sequentially dressed in sterile ABD pads, followed by kerlex. Pt tolerated well. She has chronic drainage from the wounds but no signs to suggest new infection - no fever, no  increased pain. She does state she has been out of ABX however and she is very high risk for anaerobic and possibly pseudomonal infection - will treat with clinda/cipro per UptoDate guidelines. Otherwise, her Hgb is actually significantly improved. WBC remains stable. Will d/c with outpt follow-up.  Final Clinical Impressions(s) / ED Diagnoses   Final diagnoses:  Open wound of left chest wall, subsequent encounter    New Prescriptions Discharge Medication List as of 12/29/2016  9:26 PM    START taking these medications   Details  ciprofloxacin (CIPRO) 500 MG tablet Take 1 tablet (500 mg total) by mouth every 12 (twelve) hours., Starting Tue 12/29/2016, Until Fri 01/08/2017, Print    clindamycin (CLEOCIN) 300 MG capsule Take 1 capsule (300 mg total) by mouth 3 (three) times daily., Starting Tue 12/29/2016, Until Fri 01/08/2017, Print    potassium chloride SA (K-DUR,KLOR-CON) 20 MEQ tablet Take 1 tablet (20 mEq total) by mouth 2 (two) times daily., Starting Tue 12/29/2016, Until Fri 01/01/2017, Print         Duffy Bruce, MD 12/30/16 [phone]

## 2016-12-29 NOTE — ED Notes (Signed)
Bed: MB55 Expected date:  Expected time:  Means of arrival:  Comments: Kamarie, Ven[place]

## 2016-12-29 NOTE — Patient Instructions (Signed)

## 2016-12-30 ENCOUNTER — Ambulatory Visit: Payer: Self-pay | Admitting: Hematology and Oncology

## 2016-12-30 ENCOUNTER — Other Ambulatory Visit: Payer: Self-pay

## 2016-12-30 ENCOUNTER — Ambulatory Visit: Payer: Self-pay

## 2016-12-30 LAB — TYPE AND SCREEN
ABO/RH(D): A POS
ANTIBODY SCREEN: NEGATIVE
Unit division: 0

## 2016-12-30 LAB — BPAM RBC
Blood Product Expiration Date: 201808292359
ISSUE DATE / TIME: 201808071352
UNIT TYPE AND RH: 6200

## 2016-12-31 ENCOUNTER — Encounter: Payer: Self-pay | Admitting: *Deleted

## 2016-12-31 NOTE — Progress Notes (Signed)
[facility] [place] Work  Clinical Social Work continues to follow for complex social situation. Pt's referral to [facility] was declined as pt has MCD not MCR for insurance. Pt continues to use ED sevral times a week for dressing changes. Pt is not homebound and thus not eligible for [facility]. CSW has several other options for additional support/case management, but the referrals require an emergency contact and pt has continued to decline this information. CSW provided pt extensive education on her options for assistance and the barriers in obtaining these resources. She will think about this and consider. CSW educated pt on additional option through [facility] and pt would have to go to [facility] to meet with them to initiate services. Pt is considering this as well. CSW educated pt she could have free lunch while there. CSW will check in at appt on 01/01/17 and follow closely.   Clinical Social Work interventions:  Resource education Pt Advocacy  Loren Racer, LCSW, OSW-C Clinical Social Worker [place]  [place] Phone: [phone] Fax: [phone]

## 2016-12-31 NOTE — Progress Notes (Addendum)
12/30/16 330PM:  Pt came and walked in the cancer center today to have dressing changed. Pt stated that she was in the ED yesterday for a dressing change, but has now saturated her dressing. It took 2 nurses to change pt dressing today. Removed previous dressing with saline flush d/t tissue adhesion. Replaced with wet to dry dressing using 4x4 gauzes and reinforced with dry ABD pads. Used metapor tape to secure abd pads and wraped kerlex to keep dressing intact. Pt olerated dressing change with no complaints. Pt given new scrub top to change due to blood saturation on shirt. Pt will call for transportation to take her home. Reminded pt that her next appt for flush/picc dressing is on Friday 8/10. Pt verbalized understanding.

## 2017-01-01 ENCOUNTER — Ambulatory Visit (HOSPITAL_BASED_OUTPATIENT_CLINIC_OR_DEPARTMENT_OTHER): Payer: Medicaid Other

## 2017-01-01 DIAGNOSIS — Z452 Encounter for adjustment and management of vascular access device: Secondary | ICD-10-CM | POA: Diagnosis present

## 2017-01-01 DIAGNOSIS — Z95828 Presence of other vascular implants and grafts: Secondary | ICD-10-CM

## 2017-01-01 DIAGNOSIS — D649 Anemia, unspecified: Secondary | ICD-10-CM | POA: Diagnosis not present

## 2017-01-01 DIAGNOSIS — C50911 Malignant neoplasm of unspecified site of right female breast: Secondary | ICD-10-CM

## 2017-01-01 DIAGNOSIS — C7800 Secondary malignant neoplasm of unspecified lung: Secondary | ICD-10-CM

## 2017-01-01 DIAGNOSIS — C50412 Malignant neoplasm of upper-outer quadrant of left female breast: Secondary | ICD-10-CM | POA: Diagnosis not present

## 2017-01-01 MED ORDER — SODIUM CHLORIDE 0.9% FLUSH
10.0000 mL | Freq: Once | INTRAVENOUS | Status: AC
  Administered 2017-01-01: 10 mL
  Filled 2017-01-01: qty 10

## 2017-01-01 MED ORDER — HEPARIN SOD (PORK) LOCK FLUSH 100 UNIT/ML IV SOLN
250.0000 [IU] | Freq: Once | INTRAVENOUS | Status: AC
  Administered 2017-01-01: 250 [IU]
  Filled 2017-01-01: qty 5

## 2017-01-01 NOTE — Progress Notes (Signed)
Pt came in today for picc line dressing change and wound care change. Pt previous dressing was saturated. Removed dressing with saline flushes and washed wound site. Minimal bleeding noted. Applied fresh xeroform dressing and covered wet to dry 4x4 gauzes and ABD pads. Secured dressing with metapor tape and kerlex.

## 2017-01-02 ENCOUNTER — Telehealth: Payer: Self-pay

## 2017-01-02 NOTE — Telephone Encounter (Signed)
10/26 appts moved to 10/25 as dr [name] will be out of the office,will have patient get a new schedule while here 01/04/17   Ann Oliver

## 2017-01-03 ENCOUNTER — Emergency Department (HOSPITAL_COMMUNITY)
Admission: EM | Admit: 2017-01-03 | Discharge: 2017-01-03 | Disposition: A | Discharge status: Home / Self Care | Payer: Medicaid Other | Attending: Emergency Medicine | Admitting: Emergency Medicine

## 2017-01-03 ENCOUNTER — Encounter (HOSPITAL_COMMUNITY): Payer: Self-pay

## 2017-01-03 DIAGNOSIS — Z853 Personal history of malignant neoplasm of breast: Secondary | ICD-10-CM | POA: Insufficient documentation

## 2017-01-03 DIAGNOSIS — N6321 Unspecified lump in the left breast, upper outer quadrant: Secondary | ICD-10-CM | POA: Diagnosis not present

## 2017-01-03 DIAGNOSIS — N63 Unspecified lump in unspecified breast: Secondary | ICD-10-CM

## 2017-01-03 DIAGNOSIS — Z4800 Encounter for change or removal of nonsurgical wound dressing: Secondary | ICD-10-CM | POA: Diagnosis present

## 2017-01-03 NOTE — ED Provider Notes (Signed)
[facility] DEPT Provider Note   CSN: [id number] Arrival date & time: 01/03/17  1025     History   Chief Complaint No chief complaint on file.   HPI Ann Oliver is a 61 y.o. female history of fungating breast mass, here presenting with dressing change. Patient was seen in the ED multiple times over the last several weeks for dressing change. Most recently, patient was here 5 days ago and dressing was changed. Patient states that there is some drainage from the breast mass and in particular, she has some mild bleeding from it. Denies any chest pain or shortness of breath. Patient has been following up with oncology. She has seen wound care center several weeks ago and has appointment in 2 weeks.   The history is provided by the patient.    Past Medical History:  Diagnosis Date  . Cancer ([place])   . Sinusitis   . Tumor cells     Patient Active Problem List   Diagnosis Date Noted  . Port catheter in place 12/02/2016  . Necrotizing soft tissue infection 11/21/2016  . Cellulitis 10/28/2016  . Wound infection 10/28/2016  . Cellulitis of left breast 10/28/2016  . Encounter for palliative care   . Goals of care, counseling/discussion   . Breast cancer metastasized to lung, right ([place]) 09/25/2016  . Malnutrition of moderate degree 09/25/2016  . Breast cancer of upper-outer quadrant of left female breast ([place]) 10/22/2015  . THYROID NODULE, RIGHT 05/31/2007  . DEPRESSION 05/31/2007  . GERD 05/31/2007  . HOT FLASHES 05/31/2007  . HEADACHE 05/31/2007    Past Surgical History:  Procedure Laterality Date  . BIOPSY BREAST    . IR FLUORO GUIDE CV LINE RIGHT  11/06/2016  . IR US GUIDE VASC ACCESS RIGHT  11/06/2016    OB History    No data available       Home Medications    Prior to Admission medications   Medication Sig Start Date End Date Taking? Authorizing Provider  ciprofloxacin (CIPRO) 500 MG tablet Take 1 tablet (500 mg total) by mouth every 12 (twelve) hours.  12/29/16 01/08/17 Yes Duffy Bruce, MD  clindamycin (CLEOCIN) 300 MG capsule Take 1 capsule (300 mg total) by mouth 3 (three) times daily. 12/29/16 01/08/17 Yes Duffy Bruce, MD  feeding supplement, ENSURE ENLIVE, (ENSURE ENLIVE) LIQD Take 237 mLs by mouth 3 (three) times daily between meals. 09/26/16  Yes [name], MD  ibuprofen (ADVIL,MOTRIN) 200 MG tablet Take 400-600 mg by mouth every 6 (six) hours as needed for mild pain.    Yes [provider]  ondansetron (ZOFRAN) 8 MG tablet Take 1 tablet (8 mg total) by mouth every 8 (eight) hours as needed for nausea or vomiting. 12/29/16  Yes Nicholas Lose, MD  oxyCODONE-acetaminophen (PERCOCET/ROXICET) 5-325 MG tablet Take 1 tablet by mouth every 6 (six) hours as needed for severe pain. May take 2 tablets PO q 6 hours for severe pain - Do not take with Tylenol as this tablet already contains tylenol Patient taking differently: Take 2 tablets by mouth every 6 (six) hours as needed for severe pain. May take 2 tablets PO q 6 hours for severe pain - Do not take with Tylenol as this tablet already contains tylenol 12/29/16  Yes Nicholas Lose, MD  potassium chloride SA (K-DUR,KLOR-CON) 20 MEQ tablet Take 1 tablet (20 mEq total) by mouth 2 (two) times daily. Patient taking differently: Take 20 mEq by mouth daily.  12/29/16 01/03/18 Yes Duffy Bruce, MD  Family History Family History  Problem Relation Age of Onset  . Breast cancer Neg Hx     Social History Social History  Substance Use Topics  . Smoking status: Never Smoker  . Smokeless tobacco: Never Used  . Alcohol use No     Allergies   Aspirin   Review of Systems Review of Systems  Musculoskeletal:       Breast mass   All other systems reviewed and are negative.    Physical Exam Updated Vital Signs BP (!) 146/88 (BP Location: Left Arm)   Pulse 94   Temp 98.3 F (36.8 C) (Oral)   Resp 12   Ht 5\' 1"  (1.549 m)   Wt 52.2 kg (115 lb)   SpO2 100%   BMI 21.73 kg/m    Physical Exam  Constitutional:  Chronically ill   HENT:  Head: Normocephalic.  Eyes: Pupils are equal, round, and reactive to light. EOM are normal.  Neck: Normal range of motion.  Cardiovascular: Normal rate, regular rhythm and normal heart sounds.   Pulmonary/Chest: Effort normal and breath sounds normal.  Large fungating L breast mass with dressing. Dressing removed, there is small areas of punctate bleeding on the superior aspect.   Abdominal: Soft. Bowel sounds are normal.  Musculoskeletal: Normal range of motion.  Neurological: She is alert.  Skin: Skin is warm.  R PICC line in place   Psychiatric: She has a normal mood and affect.  Nursing note and vitals reviewed.    ED Treatments / Results  Labs (all labs ordered are listed, but only abnormal results are displayed) Labs Reviewed - No data to display  EKG  EKG Interpretation None       Radiology No results found.  Procedures Procedures (including critical care time)  The wound is cleansed, debrided of foreign material as much as possible, and dressed. The patient is alerted to watch for any signs of infection (redness, pus, pain, increased swelling or fever) and call if such occurs. Home wound care instructions are provided.     Medications Ordered in ED Medications - No data to display   Initial Impression / Assessment and Plan / ED Course  I have reviewed the triage vital signs and the nursing notes.  Pertinent labs & imaging results that were available during my care of the patient were reviewed by me and considered in my medical decision making (see chart for details).    Ann Oliver is a 61 y.o. female here with breast mass requesting dressing change. Has seen wound care recently and has appointment in about 2 weeks. She has been here multiple times since then for dressing change. I removed the dressing, small amount of bleeding noted. I put some surgicel and more petroleum dressing and ABD and  curlix and bleeding controlled. Ordered CBC but she refused, stating that she has multiple labs that were fine and only had a little bleeding. Encouraged her to follow up with oncology and wound care center.    Final Clinical Impressions(s) / ED Diagnoses   Final diagnoses:  None    New Prescriptions New Prescriptions   No medications on file     Drenda Freeze, MD 01/03/17 1151

## 2017-01-03 NOTE — ED Notes (Signed)
This nurse assisted Dr Darl Householder in redressing the patient's wound using xeroform guaze, ABD pads, guaze pads, kerlex, and coban.

## 2017-01-03 NOTE — ED Triage Notes (Signed)
Patient presents with c/o left breast wound. Pt is currently undergoing chemotherapy and radiation. Pt reported that her wound is getting larger and she needed dressing change done today and doctor to look at it.

## 2017-01-03 NOTE — Discharge Instructions (Signed)
Continue your current meds.   Please see your oncologist and see wound care center   Return to ER if the dressing is soaked with blood, severe pain, trouble breathing, purulent drainage from the wound

## 2017-01-03 NOTE — ED Notes (Signed)
Bed: WA05 Expected date:  Expected time:  Means of arrival:  Comments: 

## 2017-01-04 ENCOUNTER — Ambulatory Visit (HOSPITAL_BASED_OUTPATIENT_CLINIC_OR_DEPARTMENT_OTHER): Payer: Medicaid Other

## 2017-01-04 DIAGNOSIS — C7800 Secondary malignant neoplasm of unspecified lung: Secondary | ICD-10-CM

## 2017-01-04 DIAGNOSIS — Z452 Encounter for adjustment and management of vascular access device: Secondary | ICD-10-CM | POA: Diagnosis present

## 2017-01-04 DIAGNOSIS — Z95828 Presence of other vascular implants and grafts: Secondary | ICD-10-CM

## 2017-01-04 DIAGNOSIS — C50412 Malignant neoplasm of upper-outer quadrant of left female breast: Secondary | ICD-10-CM | POA: Diagnosis not present

## 2017-01-04 DIAGNOSIS — D649 Anemia, unspecified: Secondary | ICD-10-CM | POA: Diagnosis not present

## 2017-01-04 DIAGNOSIS — C50911 Malignant neoplasm of unspecified site of right female breast: Secondary | ICD-10-CM

## 2017-01-04 MED ORDER — HEPARIN SOD (PORK) LOCK FLUSH 100 UNIT/ML IV SOLN
250.0000 [IU] | Freq: Once | INTRAVENOUS | Status: AC
  Administered 2017-01-04: 250 [IU]
  Filled 2017-01-04: qty 5

## 2017-01-04 MED ORDER — SODIUM CHLORIDE 0.9% FLUSH
10.0000 mL | Freq: Once | INTRAVENOUS | Status: AC
  Administered 2017-01-04: 10 mL
  Filled 2017-01-04: qty 10

## 2017-01-05 ENCOUNTER — Telehealth: Payer: Self-pay | Admitting: *Deleted

## 2017-01-05 NOTE — Telephone Encounter (Signed)
This RN received call back from [place] per need for arranging of transportation for this Thursday for scans at [facility] radiology department.  Date, time with expected length and location verified.   Note per discussion with Baldo Ash this RN inquired how to best communicate for continuity for arranging of transportation.  Baldo Ash stated best to call - may leave detailed message but ideally if pt has recurring appointments standing order can be made if appointment times are consistantly at the same time.  This RN reviewed current request which is in place up to scheduled appointments on 8/24.  This RN requested for pt to have a standing order for transportation M/W/F at 1pm for PICC line and dressing care.  This RN will place note on face page with [place]'s number for contact.

## 2017-01-05 NOTE — Telephone Encounter (Signed)
Late entry per dressing change on 01/04/2017.  Post PICC line care- this RN was requested by pt to have her dressing changed to her left breast.  Dressing removed with use of saline washes to decrease bleeding - noted petroleum guaze adjacent to fungating wound - with minimal bleeding upon removal.  Overall wound intact with bleeding at top of mass.  Wound redressed with petroleum guaze - 4x4's - kerlex - abd pad - telfa - tapped and wrapped with cobane for secure of dressing.  Pt tolerated procedure without difficulty.

## 2017-01-06 ENCOUNTER — Telehealth: Payer: Self-pay | Admitting: *Deleted

## 2017-01-06 ENCOUNTER — Telehealth: Payer: Self-pay

## 2017-01-06 ENCOUNTER — Encounter (HOSPITAL_BASED_OUTPATIENT_CLINIC_OR_DEPARTMENT_OTHER): Payer: Medicaid Other | Attending: Surgery

## 2017-01-06 ENCOUNTER — Ambulatory Visit (HOSPITAL_BASED_OUTPATIENT_CLINIC_OR_DEPARTMENT_OTHER): Payer: Medicaid Other

## 2017-01-06 DIAGNOSIS — R197 Diarrhea, unspecified: Secondary | ICD-10-CM

## 2017-01-06 DIAGNOSIS — C7800 Secondary malignant neoplasm of unspecified lung: Secondary | ICD-10-CM

## 2017-01-06 DIAGNOSIS — L03313 Cellulitis of chest wall: Secondary | ICD-10-CM | POA: Diagnosis not present

## 2017-01-06 DIAGNOSIS — Z95828 Presence of other vascular implants and grafts: Secondary | ICD-10-CM

## 2017-01-06 DIAGNOSIS — C50911 Malignant neoplasm of unspecified site of right female breast: Secondary | ICD-10-CM

## 2017-01-06 DIAGNOSIS — D0502 Lobular carcinoma in situ of left breast: Secondary | ICD-10-CM | POA: Insufficient documentation

## 2017-01-06 DIAGNOSIS — C50412 Malignant neoplasm of upper-outer quadrant of left female breast: Secondary | ICD-10-CM

## 2017-01-06 MED ORDER — HEPARIN SOD (PORK) LOCK FLUSH 100 UNIT/ML IV SOLN
250.0000 [IU] | Freq: Once | INTRAVENOUS | Status: AC
  Administered 2017-01-06: 250 [IU]
  Filled 2017-01-06: qty 5

## 2017-01-06 MED ORDER — SODIUM CHLORIDE 0.9% FLUSH
10.0000 mL | Freq: Once | INTRAVENOUS | Status: AC
  Administered 2017-01-06: 10 mL
  Filled 2017-01-06: qty 10

## 2017-01-06 MED ORDER — LOPERAMIDE HCL 2 MG PO CAPS: 2.0000 mg | ORAL_CAPSULE | ORAL | 0 refills | Status: DC | PRN

## 2017-01-06 MED ORDER — OXYCODONE-ACETAMINOPHEN 5-325 MG PO TABS
2.0000 | ORAL_TABLET | Freq: Four times a day (QID) | ORAL | 0 refills | Status: DC | PRN
Start: 2017-01-06 — End: 2017-01-15

## 2017-01-06 MED FILL — OXYCODONE-ACETAMINOPHEN 5-3: 5-325 | 7 days supply | Qty: 60 | Fill #0

## 2017-01-06 NOTE — Telephone Encounter (Signed)
Called pt back, she refused to have labs drawn, she refused fluids. rx for imodium sent to [facility]. Informed pt the rx for percocet ready for pickup.  She has appt at 2 for picc flush, pt is aware.

## 2017-01-06 NOTE — Telephone Encounter (Signed)
Pt called saying she has had diarrhea for the last 3 days. Any time she eats she has diarrhea, and at times she is incontinent. She is a little nauseated. She is able to eat - just had diarrhea afterwards. She has no abdominal pain. Denies fever. She is on cipro and will complete that on the 17th.   She is also asking for pain med refill. She is using about 8 tabs /day. She has 3-4 left.   S/w Dr Jana Hakim, s/w Beth RN, set pt up for labs and iv fluids today. Rx prepared for signature  Called pt back and lvm

## 2017-01-06 NOTE — Progress Notes (Signed)
Patient reported to flush room for Picc line care and scheduled to have dressing changed during flush appt. Patient reported she has an appointment following the flush at the wound center. Confirmed appt for Wound Center. Patient's Picc was flushed, caps changed. Prescriptions were given to patient, educated patient on use of immodium per last triage call. Patient verbalized an understanding and repeated back directions. Patient was escorted by writer to wound center to check in.

## 2017-01-06 NOTE — Telephone Encounter (Signed)
Triage paged for prescription pick up.   "Has anyone turned in a prescription?  I was here, dressing changed and flushed.  The nurse gave me the prescription but I do not have it now.  It was in these papers but now it's gone.   (Anna schedule and wound center forms in hand.) No mislaid prescriptions returned to Triage.  This nurse checked [facility] flush room, infusion areas, remaining nurse staff unsuccessfully.  "I believe I had it in while at the [facility].  I better get my purse before someone takes, it.  I'm waiting for my ride."   Messages left at wound center inquiring misplaced prescription.  Recommended try wound center before 5:00 close. 4:40 pm Patient to Valet area to obtain purse and transportation.

## 2017-01-07 ENCOUNTER — Encounter (HOSPITAL_COMMUNITY): Payer: Medicaid Other

## 2017-01-07 ENCOUNTER — Ambulatory Visit (HOSPITAL_COMMUNITY): Admission: RE | Admit: 2017-01-07 | Payer: Medicaid Other | Source: Ambulatory Visit

## 2017-01-07 NOTE — Telephone Encounter (Signed)
Ann Oliver with [facility] denies Pharmacy receipt of 01-06-2017 oxycodone-APAP 5-325 mg script.  Shared with Ann Oliver patient concern of yesterday's possible prescription loss.

## 2017-01-08 ENCOUNTER — Ambulatory Visit (HOSPITAL_BASED_OUTPATIENT_CLINIC_OR_DEPARTMENT_OTHER): Payer: Medicaid Other

## 2017-01-08 ENCOUNTER — Encounter: Payer: Self-pay | Admitting: Emergency Medicine

## 2017-01-08 VITALS — BP 130/58 | HR 90 | Temp 98.3°F | Resp 18 | Ht 61.0 in | Wt 118.2 lb

## 2017-01-08 DIAGNOSIS — C7800 Secondary malignant neoplasm of unspecified lung: Secondary | ICD-10-CM | POA: Diagnosis not present

## 2017-01-08 DIAGNOSIS — C773 Secondary and unspecified malignant neoplasm of axilla and upper limb lymph nodes: Secondary | ICD-10-CM

## 2017-01-08 DIAGNOSIS — C50911 Malignant neoplasm of unspecified site of right female breast: Secondary | ICD-10-CM

## 2017-01-08 DIAGNOSIS — Z452 Encounter for adjustment and management of vascular access device: Secondary | ICD-10-CM | POA: Diagnosis not present

## 2017-01-08 DIAGNOSIS — C50412 Malignant neoplasm of upper-outer quadrant of left female breast: Secondary | ICD-10-CM

## 2017-01-08 MED ORDER — HEPARIN SOD (PORK) LOCK FLUSH 100 UNIT/ML IV SOLN
250.0000 [IU] | Freq: Once | INTRAVENOUS | Status: AC
  Administered 2017-01-08: 250 [IU] via INTRAVENOUS
  Filled 2017-01-08: qty 5

## 2017-01-08 MED ORDER — SODIUM CHLORIDE 0.9% FLUSH
10.0000 mL | Freq: Once | INTRAVENOUS | Status: AC
  Administered 2017-01-08: 10 mL via INTRAVENOUS
  Filled 2017-01-08: qty 10

## 2017-01-11 ENCOUNTER — Ambulatory Visit (HOSPITAL_BASED_OUTPATIENT_CLINIC_OR_DEPARTMENT_OTHER): Payer: Medicaid Other

## 2017-01-11 ENCOUNTER — Other Ambulatory Visit: Payer: Self-pay

## 2017-01-11 DIAGNOSIS — C7800 Secondary malignant neoplasm of unspecified lung: Secondary | ICD-10-CM | POA: Diagnosis not present

## 2017-01-11 DIAGNOSIS — C773 Secondary and unspecified malignant neoplasm of axilla and upper limb lymph nodes: Secondary | ICD-10-CM | POA: Diagnosis not present

## 2017-01-11 DIAGNOSIS — C50412 Malignant neoplasm of upper-outer quadrant of left female breast: Secondary | ICD-10-CM | POA: Diagnosis not present

## 2017-01-11 DIAGNOSIS — Z452 Encounter for adjustment and management of vascular access device: Secondary | ICD-10-CM

## 2017-01-11 DIAGNOSIS — C50911 Malignant neoplasm of unspecified site of right female breast: Secondary | ICD-10-CM

## 2017-01-11 DIAGNOSIS — Z95828 Presence of other vascular implants and grafts: Secondary | ICD-10-CM

## 2017-01-11 MED ORDER — HEPARIN SOD (PORK) LOCK FLUSH 100 UNIT/ML IV SOLN
250.0000 [IU] | Freq: Once | INTRAVENOUS | Status: AC
  Administered 2017-01-11: 250 [IU]
  Filled 2017-01-11: qty 5

## 2017-01-11 MED ORDER — DIPHENOXYLATE-ATROPINE 2.5-0.025 MG PO TABS: 1.0000 | ORAL_TABLET | Freq: Four times a day (QID) | ORAL | 0 refills | Status: DC | PRN

## 2017-01-11 MED ORDER — SODIUM CHLORIDE 0.9% FLUSH
10.0000 mL | Freq: Once | INTRAVENOUS | Status: AC
  Administered 2017-01-11: 10 mL
  Filled 2017-01-11: qty 10

## 2017-01-11 MED FILL — DIPHENOXYLATE/ATROPINE TAB: 2.5-0.025 | 7 days supply | Qty: 30 | Fill #0

## 2017-01-11 NOTE — Patient Instructions (Signed)
PICC Home Guide °A peripherally inserted central catheter (PICC) is a long, thin, flexible tube that is inserted into a vein in the upper arm. It is a form of intravenous (IV) access. It is considered to be a "central" line because the tip of the PICC ends in a large vein in your chest. This large vein is called the superior vena cava (SVC). The PICC tip ends in the SVC because there is a lot of blood flow in the SVC. This allows medicines and IV fluids to be quickly distributed throughout the body. The PICC is inserted using a sterile technique by a specially trained nurse or physician. After the PICC is inserted, a chest X-ray exam is done to be sure it is in the correct place. °A PICC may be placed for different reasons, such as: °· To give medicines and liquid nutrition that can only be given through a central line. Examples are: °? Certain antibiotic treatments. °? Chemotherapy. °? Total parenteral nutrition (TPN). °· To take frequent blood samples. °· To give IV fluids and blood products. °· If there is difficulty placing a peripheral intravenous (PIV) catheter. ° °If taken care of properly, a PICC can remain in place for several months. A PICC can also allow a person to go home from the hospital early. Medicine and PICC care can be managed at home by a family member or home health care team. °What problems can happen when I have a PICC? °Problems with a PICC can occasionally occur. These may include the following: °· A blood clot (thrombus) forming in or at the tip of the PICC. This can cause the PICC to become clogged. A clot-dissolving medicine called tissue plasminogen activator (tPA) can be given through the PICC to help break up the clot. °· Inflammation of the vein (phlebitis) in which the PICC is placed. Signs of inflammation may include redness, pain at the insertion site, red streaks, or being able to feel a "cord" in the vein where the PICC is located. °· Infection in the PICC or at the insertion  site. Signs of infection may include fever, chills, redness, swelling, or pus drainage from the PICC insertion site. °· PICC movement (malposition). The PICC tip may move from its original position due to excessive physical activity, forceful coughing, sneezing, or vomiting. °· A break or cut in the PICC. It is important to not use scissors near the PICC. °· Nerve or tendon irritation or injury during PICC insertion. ° °What should I keep in mind about activities when I have a PICC? °· You may bend your arm and move it freely. If your PICC is near or at the bend of your elbow, avoid activity with repeated motion at the elbow. °· Rest at home for the remainder of the day following PICC line insertion. °· Avoid lifting heavy objects as instructed by your health care provider. °· Avoid using a crutch with the arm on the same side as your PICC. You may need to use a walker. °What should I know about my PICC dressing? °· Keep your PICC bandage (dressing) clean and dry to prevent infection. °? Ask your health care provider when you may shower. Ask your health care provider to teach you how to wrap the PICC when you do take a shower. °· Change the PICC dressing as instructed by your health care provider. °· Change your PICC dressing if it becomes loose or wet. °What should I know about PICC care? °· Check the PICC insertion   site daily for leakage, redness, swelling, or pain. °· Do not take a bath, swim, or use hot tubs when you have a PICC. Cover PICC line with clear plastic wrap and tape to keep it dry while showering. °· Flush the PICC as directed by your health care provider. Let your health care provider know right away if the PICC is difficult to flush or does not flush. Do not use force to flush the PICC. °· Do not use a syringe that is less than 10 mL to flush the PICC. °· Never pull or tug on the PICC. °· Avoid blood pressure checks on the arm with the PICC. °· Keep your PICC identification card with you at all  times. °· Do not take the PICC out yourself. Only a trained clinical professional should remove the PICC. °Get help right away if: °· Your PICC is accidentally pulled all the way out. If this happens, cover the insertion site with a bandage or gauze dressing. Do not throw the PICC away. Your health care provider will need to inspect it. °· Your PICC was tugged or pulled and has partially come out. Do not  push the PICC back in. °· There is any type of drainage, redness, or swelling where the PICC enters the skin. °· You cannot flush the PICC, it is difficult to flush, or the PICC leaks around the insertion site when it is flushed. °· You hear a "flushing" sound when the PICC is flushed. °· You have pain, discomfort, or numbness in your arm, shoulder, or jaw on the same side as the PICC. °· You feel your heart "racing" or skipping beats. °· You notice a hole or tear in the PICC. °· You develop chills or a fever. °This information is not intended to replace advice given to you by your health care provider. Make sure you discuss any questions you have with your health care provider. °Document Released: 11/15/2002 Document Revised: 11/29/2015 Document Reviewed: 03/03/2013 °Elsevier Interactive Patient Education © 2017 [employer] ° °

## 2017-01-11 NOTE — Telephone Encounter (Signed)
This nurse encountered patient in [facility] lobby today.  "My prescription was at the wound center.  When you asked me where I'd been, that helped.  Thanks."

## 2017-01-15 ENCOUNTER — Telehealth: Payer: Self-pay

## 2017-01-15 ENCOUNTER — Ambulatory Visit (HOSPITAL_BASED_OUTPATIENT_CLINIC_OR_DEPARTMENT_OTHER): Payer: Medicaid Other

## 2017-01-15 ENCOUNTER — Other Ambulatory Visit: Payer: Self-pay

## 2017-01-15 ENCOUNTER — Other Ambulatory Visit (HOSPITAL_BASED_OUTPATIENT_CLINIC_OR_DEPARTMENT_OTHER): Payer: Medicaid Other

## 2017-01-15 ENCOUNTER — Ambulatory Visit: Payer: Medicaid Other

## 2017-01-15 ENCOUNTER — Encounter: Payer: Self-pay | Admitting: Hematology and Oncology

## 2017-01-15 ENCOUNTER — Ambulatory Visit (HOSPITAL_BASED_OUTPATIENT_CLINIC_OR_DEPARTMENT_OTHER): Payer: Medicaid Other | Admitting: Hematology and Oncology

## 2017-01-15 ENCOUNTER — Encounter: Payer: Self-pay | Admitting: *Deleted

## 2017-01-15 DIAGNOSIS — Z5111 Encounter for antineoplastic chemotherapy: Secondary | ICD-10-CM | POA: Diagnosis not present

## 2017-01-15 DIAGNOSIS — C773 Secondary and unspecified malignant neoplasm of axilla and upper limb lymph nodes: Secondary | ICD-10-CM

## 2017-01-15 DIAGNOSIS — R197 Diarrhea, unspecified: Secondary | ICD-10-CM

## 2017-01-15 DIAGNOSIS — Z170 Estrogen receptor positive status [ER+]: Principal | ICD-10-CM

## 2017-01-15 DIAGNOSIS — C50412 Malignant neoplasm of upper-outer quadrant of left female breast: Secondary | ICD-10-CM

## 2017-01-15 DIAGNOSIS — C50911 Malignant neoplasm of unspecified site of right female breast: Secondary | ICD-10-CM

## 2017-01-15 DIAGNOSIS — N611 Abscess of the breast and nipple: Secondary | ICD-10-CM | POA: Diagnosis not present

## 2017-01-15 DIAGNOSIS — C7800 Secondary malignant neoplasm of unspecified lung: Secondary | ICD-10-CM

## 2017-01-15 DIAGNOSIS — D242 Benign neoplasm of left breast: Secondary | ICD-10-CM

## 2017-01-15 LAB — COMPREHENSIVE METABOLIC PANEL
ALBUMIN: 3 g/dL — AB (ref 3.5–5.0)
ALK PHOS: 92 U/L (ref 40–150)
ALT: 13 U/L (ref 0–55)
AST: 21 U/L (ref 5–34)
Anion Gap: 9 mEq/L (ref 3–11)
BILIRUBIN TOTAL: 0.24 mg/dL (ref 0.20–1.20)
BUN: 12.9 mg/dL (ref 7.0–26.0)
CALCIUM: 9.6 mg/dL (ref 8.4–10.4)
CO2: 26 mEq/L (ref 22–29)
CREATININE: 0.8 mg/dL (ref 0.6–1.1)
Chloride: 104 mEq/L (ref 98–109)
EGFR: >90 mL/min/{1.73_m2} (ref >90)
GLUCOSE: 91 mg/dL (ref 70–140)
POTASSIUM: 4.2 meq/L (ref 3.5–5.1)
Sodium: 139 mEq/L (ref 136–145)
TOTAL PROTEIN: 6.8 g/dL (ref 6.4–8.3)

## 2017-01-15 LAB — CBC WITH DIFFERENTIAL/PLATELET
BASO%: 0.6 % (ref 0.0–2.0)
Basophils Absolute: 0.1 10*3/uL (ref 0.0–0.1)
EOS%: 1.6 % (ref 0.0–7.0)
Eosinophils Absolute: 0.2 10*3/uL (ref 0.0–0.5)
HCT: 27.6 % — ABNORMAL LOW (ref 34.8–46.6)
HGB: 8.8 g/dL — ABNORMAL LOW (ref 11.6–15.9)
LYMPH%: 5.9 % — AB (ref 14.0–49.7)
MCH: 25.6 pg (ref 25.1–34.0)
MCHC: 32 g/dL (ref 31.5–36.0)
MCV: 79.9 fL (ref 79.5–101.0)
MONO#: 0.7 10*3/uL (ref 0.1–0.9)
MONO%: 6 % (ref 0.0–14.0)
NEUT%: 85.9 % — ABNORMAL HIGH (ref 38.4–76.8)
NEUTROS ABS: 10.7 10*3/uL — AB (ref 1.5–6.5)
Platelets: 569 10*3/uL — ABNORMAL HIGH (ref 145–400)
RBC: 3.45 10*6/uL — AB (ref 3.70–5.45)
RDW: 17.3 % — ABNORMAL HIGH (ref 11.2–14.5)
WBC: 12.5 10*3/uL — AB (ref 3.9–10.3)
lymph#: 0.7 10*3/uL — ABNORMAL LOW (ref 0.9–3.3)

## 2017-01-15 LAB — MAGNESIUM: MAGNESIUM: 1.9 mg/dL (ref 1.5–2.5)

## 2017-01-15 MED ORDER — SODIUM CHLORIDE 0.9 % IV SOLN
Freq: Once | INTRAVENOUS | Status: AC
  Administered 2017-01-15: 13:00:00 via INTRAVENOUS

## 2017-01-15 MED ORDER — SODIUM CHLORIDE 0.9 % IJ SOLN
10.0000 mL | Freq: Once | INTRAMUSCULAR | Status: AC
  Administered 2017-01-15: 3 mL via INTRAVENOUS
  Filled 2017-01-15: qty 10

## 2017-01-15 MED ORDER — DEXAMETHASONE SODIUM PHOSPHATE 10 MG/ML IJ SOLN
10.0000 mg | Freq: Once | INTRAMUSCULAR | Status: AC
  Administered 2017-01-15: 10 mg via INTRAVENOUS

## 2017-01-15 MED ORDER — OXYCODONE-ACETAMINOPHEN 5-325 MG PO TABS
ORAL_TABLET | ORAL | Status: AC
  Filled 2017-01-15: qty 1

## 2017-01-15 MED ORDER — DEXTROSE 5 % IV SOLN
175.0000 mg/m2 | Freq: Once | INTRAVENOUS | Status: AC
  Administered 2017-01-15: 270 mg via INTRAVENOUS
  Filled 2017-01-15: qty 45

## 2017-01-15 MED ORDER — METRONIDAZOLE BENZOATE POWD: 500.0000 mg | 0 refills | Status: DC

## 2017-01-15 MED ORDER — OXYCODONE-ACETAMINOPHEN 5-325 MG PO TABS
1.0000 | ORAL_TABLET | Freq: Once | ORAL | Status: AC
  Administered 2017-01-15: 1 via ORAL

## 2017-01-15 MED ORDER — DIPHENHYDRAMINE HCL 50 MG/ML IJ SOLN
50.0000 mg | Freq: Once | INTRAMUSCULAR | Status: AC
  Administered 2017-01-15: 50 mg via INTRAVENOUS

## 2017-01-15 MED ORDER — DEXAMETHASONE SODIUM PHOSPHATE 10 MG/ML IJ SOLN
INTRAMUSCULAR | Status: AC
  Filled 2017-01-15: qty 1

## 2017-01-15 MED ORDER — FAMOTIDINE IN NACL 20-0.9 MG/50ML-% IV SOLN
INTRAVENOUS | Status: AC
  Filled 2017-01-15: qty 50

## 2017-01-15 MED ORDER — DIPHENHYDRAMINE HCL 50 MG/ML IJ SOLN
INTRAMUSCULAR | Status: AC
  Filled 2017-01-15: qty 1

## 2017-01-15 MED ORDER — FAMOTIDINE IN NACL 20-0.9 MG/50ML-% IV SOLN
20.0000 mg | Freq: Once | INTRAVENOUS | Status: AC
  Administered 2017-01-15: 20 mg via INTRAVENOUS

## 2017-01-15 MED ORDER — OXYCODONE-ACETAMINOPHEN 5-325 MG PO TABS: 2.0000 | ORAL_TABLET | Freq: Four times a day (QID) | ORAL | 0 refills | Status: DC | PRN

## 2017-01-15 MED ORDER — HEPARIN SOD (PORK) LOCK FLUSH 100 UNIT/ML IV SOLN
250.0000 [IU] | Freq: Once | INTRAVENOUS | Status: AC | PRN
Start: 2017-01-15 — End: 2017-01-15
  Administered 2017-01-15: 250 [IU]
  Filled 2017-01-15: qty 5

## 2017-01-15 MED ORDER — SODIUM CHLORIDE 0.9% FLUSH
10.0000 mL | INTRAVENOUS | Status: DC | PRN
  Administered 2017-01-15: 10 mL
  Filled 2017-01-15: qty 10

## 2017-01-15 NOTE — Patient Instructions (Signed)

## 2017-01-15 NOTE — Progress Notes (Signed)
[facility] Work  Clinical Social Work was referred by need for CSW follow up and to re-assess psychosocial needs.  Clinical Social Worker met with patient at [facility] during chemo to offer support and assess for needs. Pt connected with Lisette Abu, [place] RN today as well. CSW had made referral on behalf of pt for additional support.  It appears pt is not sure if she is interested in their services at this time, as she would need to travel to [place] to see them. CSW again discussed PCS CNA through Medicaid, [place] will consider, but not open to referral currently. CSW provided bag of food today as requested and updated pt on CSW coverage plans for after next week. CSW team to follow.   Clinical Social Work interventions:  Resource coordination  Loren Racer, [employer], OSW-C Clinical Social Worker [place]  [place] Phone: [phone] Fax: [phone]

## 2017-01-15 NOTE — Progress Notes (Signed)
Patient Care Team: Center, [facility] as PCP - General  DIAGNOSIS:  Encounter Diagnosis  Name Primary?  . Malignant neoplasm of upper-outer quadrant of left breast in female, estrogen receptor positive ([place])     SUMMARY OF ONCOLOGIC HISTORY:   Breast cancer of upper-outer quadrant of left female breast ([place])   09/26/2015 Mammogram    Left breast 2:00 position suspicious mass 3.1 x 1.8 x 2.8 cm, indeterminate group of calcifications UIQ left breast needs stereotactic biopsy      10/22/2015 Initial Diagnosis    Left breast biopsy UOQ: Invasive adenocarcinoma, grade 2-3, EF 20%, PR 0%, Ki-67 30%, left breast UIQ: Fibroadenoma with extensive calcifications       Miscellaneous    Patient decided not to get any interventions and was lost to follow-up      05/06/2016 Imaging    CT chest: Heterogeneous necrotic mass left chest wall and UOQ 6.2 cm extending into axilla, ext into pectoralis major and to skin multiple enlarged left axillary LN, multiple more than 15 lung nodules, 1.5 cm lesion right lobe of the liver      09/25/2016 - 09/27/2016 Hospital Admission    Breast abscess (fungating tumor) 12.5 cm with axillary LN mets, mets to lungs      11/18/2016 -  Chemotherapy    Taxol every 3 weeks palliative chemotherapy        CHIEF COMPLIANT: Taxol cycle 3  INTERVAL HISTORY: Ann Oliver is a 61 year old with above-mentioned history of fungating tumor involving the left breast who is currently on palliative chemotherapy with every 3 week Taxol. Today is cycle 3. After 2 cycles it appears of the breast tumor has shrunken size. She is getting wound care dressings by home health. Today she tells me that the wound dressings to be changed.  REVIEW OF SYSTEMS:   Constitutional: Denies fevers, chills or abnormal weight loss Eyes: Denies blurriness of vision Ears, nose, mouth, throat, and face: Denies mucositis or sore throat Respiratory: Denies cough, dyspnea or  wheezes Cardiovascular: Denies palpitation, chest discomfort Gastrointestinal:  Denies nausea, heartburn or change in bowel habits Skin: Denies abnormal skin rashes Lymphatics: Denies new lymphadenopathy or easy bruising Neurological:Denies numbness, tingling or new weaknesses Behavioral/Psych: Mood is stable, no new changes  Extremities: No lower extremity edema Breast: Fungating tumor of the left breast All other systems were reviewed with the patient and are negative.  I have reviewed the past medical history, past surgical history, social history and family history with the patient and they are unchanged from previous note.  ALLERGIES:  is allergic to aspirin.  MEDICATIONS:  Current Outpatient Prescriptions  Medication Sig Dispense Refill  . diphenoxylate-atropine (LOMOTIL) 2.5-0.025 MG tablet Take 1 tablet by mouth 4 (four) times daily as needed for diarrhea or loose stools. 30 tablet 0  . feeding supplement, ENSURE ENLIVE, (ENSURE ENLIVE) LIQD Take 237 mLs by mouth 3 (three) times daily between meals. 30 Bottle 0  . ibuprofen (ADVIL,MOTRIN) 200 MG tablet Take 400-600 mg by mouth every 6 (six) hours as needed for mild pain.     Marland Kitchen loperamide (IMODIUM) 2 MG capsule Take 1 capsule (2 mg total) by mouth as needed for diarrhea or loose stools. Take as directed on package 30 capsule 0  . Metronidazole Benzoate POWD 500 mg by Does not apply route 3 (three) times a week. Apply powder topically directly to wound with each dressing change three times a week. 1 Bottle 0  . ondansetron (ZOFRAN) 8 MG tablet  Take 1 tablet (8 mg total) by mouth every 8 (eight) hours as needed for nausea or vomiting. 20 tablet 0  . oxyCODONE-acetaminophen (PERCOCET/ROXICET) 5-325 MG tablet Take 2 tablets by mouth every 6 (six) hours as needed for severe pain. Use for severe pain - Do not take with Tylenol as this tablet already contains tylenol 60 tablet 0  . potassium chloride SA (K-DUR,KLOR-CON) 20 MEQ tablet Take 1  tablet (20 mEq total) by mouth 2 (two) times daily. (Patient taking differently: Take 20 mEq by mouth daily. ) 6 tablet 0   No current facility-administered medications for this visit.    Facility-Administered Medications Ordered in Other Visits  Medication Dose Route Frequency Provider Last Rate Last Dose  . dexamethasone (DECADRON) injection 10 mg  10 mg Intravenous Once Nicholas Lose, MD   10 mg at 01/15/17 1351  . famotidine (PEPCID) IVPB 20 mg premix  20 mg Intravenous Once Nicholas Lose, MD   20 mg at 01/15/17 1356  . heparin lock flush 100 unit/mL  250 Units Intracatheter Once PRN Nicholas Lose, MD      . oxyCODONE-acetaminophen (PERCOCET/ROXICET) 5-325 MG per tablet 1 tablet  1 tablet Oral Once Nicholas Lose, MD      . PACLitaxel (TAXOL) 270 mg in dextrose 5 % 250 mL chemo infusion (> 48m/m2)  175 mg/m2 (Treatment Plan Recorded) Intravenous Once GNicholas Lose MD      . sodium chloride flush (NS) 0.9 % injection 10 mL  10 mL Intracatheter PRN GNicholas Lose MD   10 mL at 12/25/16 1702  . sodium chloride flush (NS) 0.9 % injection 10 mL  10 mL Intracatheter PRN GNicholas Lose MD        PHYSICAL EXAMINATION: ECOG PERFORMANCE STATUS: 2 - Symptomatic, <50% confined to bed  Vitals:   01/15/17 1212  BP: 138/78  Pulse: 81  Resp: 18  Temp: 98 F (36.7 C)  SpO2: 100%   Filed Weights   01/15/17 1212  Weight: 113 lb 3.2 oz (51.3 kg)    GENERAL:alert, no distress and comfortable SKIN: skin color, texture, turgor are normal, no rashes or significant lesions EYES: normal, Conjunctiva are pink and non-injected, sclera clear OROPHARYNX:no exudate, no erythema and lips, buccal mucosa, and tongue normal  NECK: supple, thyroid normal size, non-tender, without nodularity LYMPH:  no palpable lymphadenopathy in the cervical, axillary or inguinal LUNGS: clear to auscultation and percussion with normal breathing effort HEART: regular rate & rhythm and no murmurs and no lower extremity  edema ABDOMEN:abdomen soft, non-tender and normal bowel sounds MUSCULOSKELETAL:no cyanosis of digits and no clubbing  NEURO: alert & oriented x 3 with fluent speech, no focal motor/sensory deficits EXTREMITIES: No lower extremity edema   LABORATORY DATA:  I have reviewed the data as listed   Chemistry      Component Value Date/Time   NA 139 01/15/2017 1045   K 4.2 01/15/2017 1045   CL 105 12/29/2016 1902   CO2 26 01/15/2017 1045   BUN 12.9 01/15/2017 1045   CREATININE 0.8 01/15/2017 1045      Component Value Date/Time   CALCIUM 9.6 01/15/2017 1045   ALKPHOS 92 01/15/2017 1045   AST 21 01/15/2017 1045   ALT 13 01/15/2017 1045   BILITOT 0.24 01/15/2017 1045       Lab Results  Component Value Date   WBC 12.5 (H) 01/15/2017   HGB 8.8 (L) 01/15/2017   HCT 27.6 (L) 01/15/2017   MCV 79.9 01/15/2017   PLT 569 (H)  01/15/2017   NEUTROABS 10.7 (H) 01/15/2017    ASSESSMENT & PLAN:  Breast cancer of upper-outer quadrant of left female breast ([place]) 09/26/2015 Left breast 2:00 position suspicious mass 3.1 x 1.8 x 2.8 cm, indeterminate group of calcifications UIQ left breast needs stereotactic biopsy Left breast biopsy UOQ 10/22/2015: Invasive adenocarcinoma, grade 2-3, ER 20%, PR 0%, Ki-67 30%, left breast UIQ: Fibroadenoma with extensive calcifications Clinical stage in May 2017: T2 N0 stage II a Clinical stage 05/08/2016: Stage IV  Hospitalization 5/5-5/7: Breast abscess Patient was lost to follow up Refused treatment previously opting for homeopathic treatments. She returned back to see [place] in June.  Plan: 1. Palliative treatment with Taxol every 3 weeks started 11/18/2016 -------------------------------------------------------------------- Current treatment: Cycle 3 Taxol Chemotherapy toxicities:  Wound infection issues: Patient is currently getting wound care at home.  But she continues to come to the emergency department as well as to our clinic for wound dressing  changes. [facility] and is working to get her more assistance at home.  Patient has a very high risk of infection.  Return to clinic in 3 weeks for cycle 4   I spent 25 minutes talking to the patient of which more than half was spent in counseling and coordination of care.  No orders of the defined types were placed in this encounter.  The patient has a good understanding of the overall plan. she agrees with it. she will call with any problems that may develop before the next visit here.   Rulon Eisenmenger, MD 01/15/17

## 2017-01-15 NOTE — Assessment & Plan Note (Addendum)
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 She returned back to see [place] in June.  Plan: 1. Palliative treatment with Taxol every 3 weeks started 11/18/2016 -------------------------------------------------------------------- Current treatment: Cycle 3 Taxol Chemotherapy toxicities:  Wound infection issues: Patient is currently getting wound care at home.  But she continues to come to the emergency department as well as to our clinic for wound dressing changes. [facility] and is working to get her more assistance at home.  Patient has a very high risk of infection.  Return to clinic in 3 weeks for cycle 4

## 2017-01-15 NOTE — Telephone Encounter (Signed)
Pt in the clinic today to see Dr.Gudena and to have 3rd taxol treatment. Pt requesting for dressing change today (Wet-dry dressing with petroleum gauze,  Pt last dressing change was on Tuesday by home health (visits 1-2 times per week). Pt states she will be following up with wound care clinic in 2-3 weeks. Pt also would like to notify Dr.Gudena that she was not able to fill her pain prescription because she had lost it and unable to locate it after she came from the wound care clinic. Will fill pain script per Dr.Gudena. Also, metronidazole powder ordered for pt to apply during dressing changes, directly on her wounds to help with odor and infection control. Pt will need to get her pain medication and antibiotic powder filled at her local pharmacy today. Pt verbalized understanding.No further questions or concerns at this time.

## 2017-01-15 NOTE — Telephone Encounter (Signed)
picc line flush and dressing changes added per 01/14/17 inbasket,patient will get a new schedule at todays visit

## 2017-01-15 NOTE — Patient Instructions (Signed)
[facility] Discharge Instructions for Patients Receiving Chemotherapy  Today you received the following chemotherapy agents:  paclitaxel (Taxol).  To help prevent nausea and vomiting after your treatment, we encourage you to take your nausea medication as directed.   If you develop nausea and vomiting that is not controlled by your nausea medication, call the clinic.   BELOW ARE SYMPTOMS THAT SHOULD BE REPORTED IMMEDIATELY:  *FEVER GREATER THAN 100.5 F  *CHILLS WITH OR WITHOUT FEVER  NAUSEA AND VOMITING THAT IS NOT CONTROLLED WITH YOUR NAUSEA MEDICATION  *UNUSUAL SHORTNESS OF BREATH  *UNUSUAL BRUISING OR BLEEDING  TENDERNESS IN MOUTH AND THROAT WITH OR WITHOUT PRESENCE OF ULCERS  *URINARY PROBLEMS  *BOWEL PROBLEMS  UNUSUAL RASH Items with * indicate a potential emergency and should be followed up as soon as possible.  Feel free to call the clinic you have any questions or concerns. The clinic phone number is [phone].  Please show the [facility] at check-in to the Emergency Department and triage nurse.

## 2017-01-18 ENCOUNTER — Encounter (HOSPITAL_COMMUNITY): Payer: Self-pay

## 2017-01-18 ENCOUNTER — Inpatient Hospital Stay (HOSPITAL_COMMUNITY)
Admission: EM | Admit: 2017-01-18 | Discharge: 2017-01-24 | DRG: 597 | Disposition: A | Discharge status: Home Health | Payer: Medicaid Other | Attending: Internal Medicine | Admitting: Internal Medicine

## 2017-01-18 ENCOUNTER — Telehealth: Payer: Self-pay | Admitting: Hematology and Oncology

## 2017-01-18 DIAGNOSIS — C7800 Secondary malignant neoplasm of unspecified lung: Secondary | ICD-10-CM | POA: Diagnosis present

## 2017-01-18 DIAGNOSIS — D638 Anemia in other chronic diseases classified elsewhere: Secondary | ICD-10-CM | POA: Diagnosis present

## 2017-01-18 DIAGNOSIS — Z9221 Personal history of antineoplastic chemotherapy: Secondary | ICD-10-CM

## 2017-01-18 DIAGNOSIS — C787 Secondary malignant neoplasm of liver and intrahepatic bile duct: Secondary | ICD-10-CM | POA: Diagnosis present

## 2017-01-18 DIAGNOSIS — R58 Hemorrhage, not elsewhere classified: Secondary | ICD-10-CM

## 2017-01-18 DIAGNOSIS — C50412 Malignant neoplasm of upper-outer quadrant of left female breast: Principal | ICD-10-CM | POA: Diagnosis present

## 2017-01-18 DIAGNOSIS — E43 Unspecified severe protein-calorie malnutrition: Secondary | ICD-10-CM | POA: Diagnosis present

## 2017-01-18 DIAGNOSIS — D500 Iron deficiency anemia secondary to blood loss (chronic): Secondary | ICD-10-CM | POA: Diagnosis not present

## 2017-01-18 DIAGNOSIS — E440 Moderate protein-calorie malnutrition: Secondary | ICD-10-CM | POA: Diagnosis present

## 2017-01-18 DIAGNOSIS — D649 Anemia, unspecified: Secondary | ICD-10-CM

## 2017-01-18 DIAGNOSIS — K529 Noninfective gastroenteritis and colitis, unspecified: Secondary | ICD-10-CM | POA: Diagnosis present

## 2017-01-18 DIAGNOSIS — D62 Acute posthemorrhagic anemia: Secondary | ICD-10-CM | POA: Diagnosis present

## 2017-01-18 DIAGNOSIS — C50912 Malignant neoplasm of unspecified site of left female breast: Secondary | ICD-10-CM

## 2017-01-18 DIAGNOSIS — R000 Tachycardia, unspecified: Secondary | ICD-10-CM | POA: Diagnosis present

## 2017-01-18 DIAGNOSIS — C50911 Malignant neoplasm of unspecified site of right female breast: Secondary | ICD-10-CM | POA: Diagnosis present

## 2017-01-18 DIAGNOSIS — Z6820 Body mass index (BMI) 20.0-20.9, adult: Secondary | ICD-10-CM

## 2017-01-18 DIAGNOSIS — Z170 Estrogen receptor positive status [ER+]: Secondary | ICD-10-CM

## 2017-01-18 LAB — CBC
HCT: 19 % — ABNORMAL LOW (ref 36.0–46.0)
Hemoglobin: 6.1 g/dL — CL (ref 12.0–15.0)
MCH: 25.8 pg — AB (ref 26.0–34.0)
MCHC: 32.1 g/dL (ref 30.0–36.0)
MCV: 80.5 fL (ref 78.0–100.0)
PLATELETS: 338 10*3/uL (ref 150–400)
RBC: 2.36 MIL/uL — ABNORMAL LOW (ref 3.87–5.11)
RDW: 16.4 % — AB (ref 11.5–15.5)
WBC: 12.9 10*3/uL — ABNORMAL HIGH (ref 4.0–10.5)

## 2017-01-18 LAB — BASIC METABOLIC PANEL
ANION GAP: 7 (ref 5–15)
BUN: 16 mg/dL (ref 6–20)
CO2: 25 mmol/L (ref 22–32)
Calcium: 8.2 mg/dL — ABNORMAL LOW (ref 8.9–10.3)
Chloride: 107 mmol/L (ref 101–111)
Creatinine, Ser: 0.64 mg/dL (ref 0.44–1.00)
GFR calc Af Amer: >60 mL/min (ref >60)
GLUCOSE: 148 mg/dL — AB (ref 65–99)
POTASSIUM: 3.5 mmol/L (ref 3.5–5.1)
SODIUM: 139 mmol/L (ref 135–145)

## 2017-01-18 LAB — PROTIME-INR
INR: 0.99
PROTHROMBIN TIME: 13.1 s (ref 11.4–15.2)

## 2017-01-18 LAB — PREPARE RBC (CROSSMATCH)

## 2017-01-18 MED ORDER — HEPARIN SOD (PORK) LOCK FLUSH 100 UNIT/ML IV SOLN: 250.0000 [IU] | INTRAVENOUS | Status: DC | PRN

## 2017-01-18 MED ORDER — HEPARIN SOD (PORK) LOCK FLUSH 100 UNIT/ML IV SOLN: 500.0000 [IU] | Freq: Every day | INTRAVENOUS | Status: DC | PRN

## 2017-01-18 MED ORDER — ACETAMINOPHEN 325 MG PO TABS: 650.0000 mg | ORAL_TABLET | Freq: Four times a day (QID) | ORAL | Status: DC | PRN

## 2017-01-18 MED ORDER — MORPHINE SULFATE (PF) 4 MG/ML IV SOLN
4.0000 mg | Freq: Once | INTRAVENOUS | Status: AC
  Administered 2017-01-18: 4 mg via INTRAVENOUS
  Filled 2017-01-18: qty 1

## 2017-01-18 MED ORDER — LOPERAMIDE HCL 2 MG PO CAPS: 2.0000 mg | ORAL_CAPSULE | ORAL | Status: DC | PRN

## 2017-01-18 MED ORDER — ACETAMINOPHEN 650 MG RE SUPP: 650.0000 mg | Freq: Four times a day (QID) | RECTAL | Status: DC | PRN

## 2017-01-18 MED ORDER — SODIUM CHLORIDE 0.9 % IV SOLN
10.0000 mL/h | Freq: Once | INTRAVENOUS | Status: AC
  Administered 2017-01-18: 10 mL/h via INTRAVENOUS

## 2017-01-18 MED ORDER — OXYCODONE-ACETAMINOPHEN 5-325 MG PO TABS
2.0000 | ORAL_TABLET | Freq: Four times a day (QID) | ORAL | Status: DC | PRN
  Administered 2017-01-18 – 2017-01-24 (×23): 2 via ORAL
  Filled 2017-01-18 (×23): qty 2

## 2017-01-18 MED ORDER — ENSURE ENLIVE PO LIQD
237.0000 mL | Freq: Three times a day (TID) | ORAL | Status: DC
  Administered 2017-01-18 – 2017-01-22 (×11): 237 mL via ORAL

## 2017-01-18 MED ORDER — MORPHINE SULFATE (PF) 2 MG/ML IV SOLN
1.0000 mg | INTRAVENOUS | Status: DC | PRN
  Administered 2017-01-18: 1 mg via INTRAVENOUS
  Filled 2017-01-18: qty 1

## 2017-01-18 MED ORDER — SODIUM CHLORIDE 0.9% FLUSH: 10.0000 mL | INTRAVENOUS | Status: DC | PRN

## 2017-01-18 MED ORDER — HYDROMORPHONE HCL-NACL 0.5-0.9 MG/ML-% IV SOSY
0.5000 mg | PREFILLED_SYRINGE | INTRAVENOUS | Status: DC | PRN
  Administered 2017-01-18 – 2017-01-19 (×4): 0.5 mg via INTRAVENOUS
  Filled 2017-01-18 (×4): qty 1

## 2017-01-18 MED ORDER — SODIUM CHLORIDE 0.9% FLUSH
3.0000 mL | INTRAVENOUS | Status: DC | PRN
Start: 2017-01-18 — End: 2017-01-24

## 2017-01-18 MED ORDER — ONDANSETRON HCL 8 MG PO TABS
8.0000 mg | ORAL_TABLET | Freq: Three times a day (TID) | ORAL | Status: DC | PRN
  Administered 2017-01-19 – 2017-01-24 (×6): 8 mg via ORAL
  Filled 2017-01-18 (×6): qty 1

## 2017-01-18 MED ORDER — DIPHENOXYLATE-ATROPINE 2.5-0.025 MG PO TABS
1.0000 | ORAL_TABLET | Freq: Four times a day (QID) | ORAL | Status: DC | PRN
  Administered 2017-01-24: 1 via ORAL
  Filled 2017-01-18: qty 1

## 2017-01-18 MED ORDER — ONDANSETRON HCL 4 MG/2ML IJ SOLN
4.0000 mg | Freq: Once | INTRAMUSCULAR | Status: AC
  Administered 2017-01-18: 4 mg via INTRAVENOUS
  Filled 2017-01-18: qty 2

## 2017-01-18 NOTE — H&P (Signed)
History and Physical    Ann Oliver ZDG:[id number] DOB: May 26, 1955 DOA: 01/18/2017  PCP: [facility]  Patient coming from: Home  Chief Complaint: Bleeding   HPI: Ann Oliver is a 61 y.o. female with medical history significant of left-sided fungating breast cancer, currently receiving palliative chemotherapy, who presents to the hospital due to ongoing bleeding of the left breast mass since Saturday. She states that it had stopped on its own, but restarted to bleed day before admission. She denies any lightheadedness or dizziness, no fevers or chills, no other chest pain or shortness of breath, no abdominal pain, or previous nausea or vomiting prior to admission.   Of note, she has been evaluated in the emergency department multiple times for multiple dressing changes. She also follows with wound care center and wound care at home.  ED Course: Laceration repair by EDP with 3 sutures placed and compressive dressing placed. Bleeding was controlled. 2u pRBC transfusion ordered due to Hgb 6.1. TRH asked for observation.   Review of Systems: As per HPI otherwise 10 point review of systems negative.   Past Medical History:  Diagnosis Date  . Cancer ([place])   . Sinusitis   . Tumor cells     Past Surgical History:  Procedure Laterality Date  . BIOPSY BREAST    . IR FLUORO GUIDE CV LINE RIGHT  11/06/2016  . IR US GUIDE VASC ACCESS RIGHT  11/06/2016     reports that she has never smoked. She has never used smokeless tobacco. She reports that she does not drink alcohol or use drugs.  Allergies  Allergen Reactions  . Aspirin Palpitations    Family History  Problem Relation Age of Onset  . Breast cancer Neg Hx     Prior to Admission medications   Medication Sig Start Date End Date Taking? Authorizing Provider  diphenoxylate-atropine (LOMOTIL) 2.5-0.025 MG tablet Take 1 tablet by mouth 4 (four) times daily as needed for diarrhea or loose stools. 01/11/17  Yes  Causey, Charlestine Massed, NP  feeding supplement, ENSURE ENLIVE, (ENSURE ENLIVE) LIQD Take 237 mLs by mouth 3 (three) times daily between meals. 09/26/16  Yes [name], MD  ibuprofen (ADVIL,MOTRIN) 200 MG tablet Take 400-600 mg by mouth every 6 (six) hours as needed for mild pain.    Yes [provider]  loperamide (IMODIUM) 2 MG capsule Take 1 capsule (2 mg total) by mouth as needed for diarrhea or loose stools. Take as directed on package 01/06/17  Yes Magrinat, Virgie Dad, MD  Metronidazole Benzoate POWD 500 mg by Does not apply route 3 (three) times a week. Apply powder topically directly to wound with each dressing change three times a week. 01/15/17  Yes Nicholas Lose, MD  ondansetron (ZOFRAN) 8 MG tablet Take 1 tablet (8 mg total) by mouth every 8 (eight) hours as needed for nausea or vomiting. 12/29/16  Yes Nicholas Lose, MD  oxyCODONE-acetaminophen (PERCOCET/ROXICET) 5-325 MG tablet Take 2 tablets by mouth every 6 (six) hours as needed for severe pain. Use for severe pain - Do not take with Tylenol as this tablet already contains tylenol 01/15/17  Yes Nicholas Lose, MD  potassium chloride SA (K-DUR,KLOR-CON) 20 MEQ tablet Take 1 tablet (20 mEq total) by mouth 2 (two) times daily. Patient not taking: Reported on 01/18/2017 12/29/16 01/03/18  Duffy Bruce, MD    Physical Exam: Vitals:   01/18/17 9518 01/18/17 0644 01/18/17 0645 01/18/17 0807  BP:  124/75  124/75  Pulse: 78 93 96  Resp:  (!) 24 (!) 25 (!) 23  Temp:  98.2 F (36.8 C)  98.4 F (36.9 C)  TempSrc:  Oral  Oral  SpO2: 100% 96%  100%     Constitutional: NAD, calm, comfortable Eyes: PERRL, lids and conjunctivae normal  ENMT: Mucous membranes are moist. Posterior pharynx clear of any exudate or lesions. Poor dentition.  Neck: normal, supple, no masses, no thyromegaly Respiratory: clear to auscultation bilaterally, no wheezing, no crackles. Normal respiratory effort. No accessory muscle use.  Cardiovascular:  Tachycardic rate and regular rhythm, no murmurs / rubs / gallops. No extremity edema. Abdomen: no tenderness, no masses palpated. No hepatosplenomegaly. Bowel sounds positive.  Musculoskeletal: no clubbing / cyanosis. No joint deformity upper and lower extremities. Normal muscle tone.  Skin: +compressive dressing in place over left breast mass  Neurologic: CN 2-12 grossly intact. Strength 5/5 in all 4.  Psychiatric: Normal judgment and insight. Alert and oriented x 3. Normal mood.   Labs on Admission: I have personally reviewed following labs and imaging studies  CBC:  Recent Labs Lab 01/15/17 1045 01/18/17 0543  WBC 12.5* 12.9*  NEUTROABS 10.7*  --   HGB 8.8* 6.1*  HCT 27.6* 19.0*  MCV 79.9 80.5  PLT 569* 272   Basic Metabolic Panel:  Recent Labs Lab 01/15/17 1045 01/18/17 0431  NA 139 139  K 4.2 3.5  CL  --  107  CO2 26 25  GLUCOSE 91 148*  BUN 12.9 16  CREATININE 0.8 0.64  CALCIUM 9.6 8.2*  MG 1.9  --    GFR: Estimated Creatinine Clearance: 55 mL/min (by C-G formula based on SCr of 0.64 mg/dL). Liver Function Tests:  Recent Labs Lab 01/15/17 1045  AST 21  ALT 13  ALKPHOS 92  BILITOT 0.24  PROT 6.8  ALBUMIN 3.0*   No results for input(s): LIPASE, AMYLASE in the last 168 hours. No results for input(s): AMMONIA in the last 168 hours. Coagulation Profile:  Recent Labs Lab 01/18/17 0431  INR 0.99   Cardiac Enzymes: No results for input(s): CKTOTAL, CKMB, CKMBINDEX, TROPONINI in the last 168 hours. BNP (last 3 results) No results for input(s): PROBNP in the last 8760 hours. HbA1C: No results for input(s): HGBA1C in the last 72 hours. CBG: No results for input(s): GLUCAP in the last 168 hours. Lipid Profile: No results for input(s): CHOL, HDL, LDLCALC, TRIG, CHOLHDL, LDLDIRECT in the last 72 hours. Thyroid Function Tests: No results for input(s): TSH, T4TOTAL, FREET4, T3FREE, THYROIDAB in the last 72 hours. Anemia Panel: No results for input(s):  VITAMINB12, FOLATE, FERRITIN, TIBC, IRON, RETICCTPCT in the last 72 hours. Urine analysis:    Component Value Date/Time   COLORURINE YELLOW 09/25/2016 0230   APPEARANCEUR CLEAR 09/25/2016 0230   LABSPEC 1.019 09/25/2016 0230   PHURINE 5.0 09/25/2016 0230   GLUCOSEU NEGATIVE 09/25/2016 0230   HGBUR NEGATIVE 09/25/2016 0230   BILIRUBINUR NEGATIVE 09/25/2016 0230   KETONESUR 5 (A) 09/25/2016 0230   PROTEINUR NEGATIVE 09/25/2016 0230   NITRITE NEGATIVE 09/25/2016 0230   LEUKOCYTESUR NEGATIVE 09/25/2016 0230   Sepsis Labs: !!!!!!!!!!!!!!!!!!!!!!!!!!!!!!!!!!!!!!!!!!!! @LABRCNTIP (procalcitonin:4,lacticidven:4) )No results found for this or any previous visit (from the past 240 hour(s)).   Radiological Exams on Admission: No results found.  Assessment/Plan Principal Problem:   Blood loss anemia   Acute blood loss anemia on chronic anemia of chronic disease -Acute anemia due to uncontrolled bleeding in left breast mass; 3 sutures and compressive dressing placed in ED  -Baseline hemoglobin levels around 8.8 -Transfuse  2u pRBC -Trend CBC   Large fungating left breast mass -Follows with [name], currently receiving palliative Taxol every 3 weeks -Receives wound care at home and also presents to the ED frequently for dressing changes  -Wound care starting tomorrow. For today continue compressive dressing   Chronic diarrhea  -Continue home lomotil, imodium   DVT prophylaxis: SCD Code Status: full, confirmed with patient  Family Communication: No family at bedside Disposition Plan: Pending stabilization of hemoglobin as well as local bleeding control, return home when stable Consults called:  Will add [name] to treatment care team  Admission status: Observation   Severity of Illness: The appropriate patient status for this patient is OBSERVATION. Observation status is judged to be reasonable and necessary in order to provide the required intensity of service to ensure the  patient's safety. The patient's presenting symptoms, physical exam findings, and initial radiographic and laboratory data in the context of their medical condition is felt to place them at decreased risk for further clinical deterioration. Furthermore, it is anticipated that the patient will be medically stable for discharge from the hospital within 2 midnights of admission. The following factors support the patient status of observation.   " The patient's presenting symptoms include bleeding of the left breast mass. " The physical exam findings include mild tachycardia. " The initial radiographic and laboratory data showed acute blood loss anemia.  Dessa Phi, DO [facility] [email or web address] Password [email or web address] 01/18/2017, 8:11 AM

## 2017-01-18 NOTE — ED Notes (Signed)
No respiratory or acute distress noted alert and oriented call light in reach no active bleeding noted.

## 2017-01-18 NOTE — ED Provider Notes (Signed)
[facility] DEPT Provider Note   CSN: [id number] Arrival date & time: 01/18/17  0350     History   Chief Complaint Chief Complaint  Patient presents with  . Bleeding at the cancer site    HPI Ann Oliver is a 61 y.o. female.  HPI Patient is a 61 year old female has a history of fungating left breast cancer who is receiving palliative chemotherapy at this time.  She presents the emergency department secondary to ongoing bleeding of the left breast mass since approximately noon.  She feels somewhat lightheaded at this time.she denies syncope.  No chest pain or chest tightness.  No shortness of breath.   Past Medical History:  Diagnosis Date  . Cancer ([place])   . Sinusitis   . Tumor cells     Patient Active Problem List   Diagnosis Date Noted  . Port catheter in place 12/02/2016  . Necrotizing soft tissue infection 11/21/2016  . Cellulitis 10/28/2016  . Wound infection 10/28/2016  . Cellulitis of left breast 10/28/2016  . Encounter for palliative care   . Goals of care, counseling/discussion   . 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 Malnutrition of moderate degree 09/25/2016  . Breast cancer of upper-outer quadrant of left female breast ([place]) 10/22/2015  . THYROID NODULE, RIGHT 05/31/2007  . DEPRESSION 05/31/2007  . GERD 05/31/2007  . HOT FLASHES 05/31/2007  . HEADACHE 05/31/2007    Past Surgical History:  Procedure Laterality Date  . BIOPSY BREAST    . IR FLUORO GUIDE CV LINE RIGHT  11/06/2016  . IR US GUIDE VASC ACCESS RIGHT  11/06/2016    OB History    No data available       Home Medications    Prior to Admission medications   Medication Sig Start Date End Date Taking? Authorizing Provider  diphenoxylate-atropine (LOMOTIL) 2.5-0.025 MG tablet Take 1 tablet by mouth 4 (four) times daily as needed for diarrhea or loose stools. 01/11/17  Yes Causey, Charlestine Massed, NP  feeding supplement, ENSURE ENLIVE, (ENSURE ENLIVE) LIQD  Take 237 mLs by mouth 3 (three) times daily between meals. 09/26/16  Yes [name], MD  ibuprofen (ADVIL,MOTRIN) 200 MG tablet Take 400-600 mg by mouth every 6 (six) hours as needed for mild pain.    Yes [provider]  loperamide (IMODIUM) 2 MG capsule Take 1 capsule (2 mg total) by mouth as needed for diarrhea or loose stools. Take as directed on package 01/06/17  Yes Magrinat, Virgie Dad, MD  Metronidazole Benzoate POWD 500 mg by Does not apply route 3 (three) times a week. Apply powder topically directly to wound with each dressing change three times a week. 01/15/17  Yes Nicholas Lose, MD  ondansetron (ZOFRAN) 8 MG tablet Take 1 tablet (8 mg total) by mouth every 8 (eight) hours as needed for nausea or vomiting. 12/29/16  Yes Nicholas Lose, MD  oxyCODONE-acetaminophen (PERCOCET/ROXICET) 5-325 MG tablet Take 2 tablets by mouth every 6 (six) hours as needed for severe pain. Use for severe pain - Do not take with Tylenol as this tablet already contains tylenol 01/15/17  Yes Nicholas Lose, MD  potassium chloride SA (K-DUR,KLOR-CON) 20 MEQ tablet Take 1 tablet (20 mEq total) by mouth 2 (two) times daily. Patient not taking: Reported on 01/18/2017 12/29/16 01/03/18  Duffy Bruce, MD    Family History Family History  Problem Relation Age of Onset  . Breast cancer Neg Hx     Social History Social History  Substance  Use Topics  . Smoking status: Never Smoker  . Smokeless tobacco: Never Used  . Alcohol use No     Allergies   Aspirin   Review of Systems Review of Systems  All other systems reviewed and are negative.    Physical Exam Updated Vital Signs BP 124/75 (BP Location: Left Arm)   Pulse 93   Temp 98.2 F (36.8 C) (Oral)   Resp (!) 24   SpO2 96%   Physical Exam  Constitutional: She is oriented to person, place, and time. She appears well-developed and well-nourished.  HENT:  Head: Normocephalic.  Eyes: EOM are normal.  Neck: Normal range of motion.    Cardiovascular: Normal rate.   Pulmonary/Chest: Effort normal.  Abdominal: Soft. She exhibits no distension.  Musculoskeletal:  Large fungating breast mass on the left lateral side of the breasts.  There is 2 areas of active bleeding  Neurological: She is alert and oriented to person, place, and time.  Psychiatric: She has a normal mood and affect.  Nursing note and vitals reviewed.    ED Treatments / Results  Labs (all labs ordered are listed, but only abnormal results are displayed) Labs Reviewed  BASIC METABOLIC PANEL - Abnormal; Notable for the following:       Result Value   Glucose, Bld 148 (*)    Calcium 8.2 (*)    All other components within normal limits  CBC - Abnormal; Notable for the following:    WBC 12.9 (*)    RBC 2.36 (*)    Hemoglobin 6.1 (*)    HCT 19.0 (*)    MCH 25.8 (*)    RDW 16.4 (*)    All other components within normal limits  PROTIME-INR  TYPE AND SCREEN  PREPARE RBC (CROSSMATCH)    Hemoglobin  Date Value Ref Range Status  01/18/2017 6.1 (LL) 12.0 - 15.0 g/dL Final    Comment:    REPEATED TO VERIFY SPECIMEN CHECKED FOR CLOTS RESULTS VERIFIED VIA RECOLLECT CRITICAL RESULT CALLED TO, READ BACK BY AND VERIFIED WITH: T.DOSTER RN [id number] A.QUIZON   12/29/2016 8.8 (L) 12.0 - 15.0 g/dL Final  11/21/2016 9.1 (L) 12.0 - 15.0 g/dL Final   HGB  Date Value Ref Range Status  01/15/2017 8.8 (L) 11.6 - 15.9 g/dL Final  12/29/2016 6.8 (LL) 11.6 - 15.9 g/dL Final  12/25/2016 7.1 (L) 11.6 - 15.9 g/dL Final  11/18/2016 8.7 (L) 11.6 - 15.9 g/dL Final     EKG  EKG Interpretation None       Radiology No results found.   ++++++++++++++++++++++++++++++++++++++++++  Procedures .Critical Care Performed by: Jola Schmidt Authorized by: Jola Schmidt    Total critical care time: 32 minutes Critical care time was exclusive of separately billable procedures and treating other patients. Critical care was necessary to treat or prevent  imminent or life-threatening deterioration. Critical care was time spent personally by me on the following activities: development of treatment plan with patient and/or surrogate as well as nursing, discussions with consultants, evaluation of patient's response to treatment, examination of patient, obtaining history from patient or surrogate, ordering and performing treatments and interventions, ordering and review of laboratory studies, ordering and review of radiographic studies, pulse oximetry and re-evaluation of patient's condition.     LACERATION REPAIR Performed by: Hoy Morn Consent: Verbal consent obtained. Risks and benefits: risks, benefits and alternatives were discussed Patient identity confirmed: provided demographic data Time out performed prior to procedure Prepped and Draped in normal sterile fashion Wound  explored Laceration Location: left breast mass Laceration Length: 0.25cm ISkin closure: 4-0 vicryl rapide Number of sutures or staples: 3 Technique: figure 8 Patient tolerance: Patient tolerated the procedure well with no immediate complications.     +++++++++++++++++++++++++++++++++++++++++++++++++   Medications Ordered in ED Medications  0.9 %  sodium chloride infusion (not administered)  ondansetron (ZOFRAN) injection 4 mg (4 mg Intravenous Given 01/18/17 0453)  morphine 4 MG/ML injection 4 mg (4 mg Intravenous Given 01/18/17 0454)  morphine 4 MG/ML injection 4 mg (4 mg Intravenous Given 01/18/17 0607)     Initial Impression / Assessment and Plan / ED Course  I have reviewed the triage vital signs and the nursing notes.  Pertinent labs & imaging results that were available during my care of the patient were reviewed by me and considered in my medical decision making (see chart for details).     Unable to control bleeding with direct pressure and therefore 3 figure-of-eight stitches were placed in 2 areas of bleeding.  One was at the inferior aspect  of the fungating mass and one was on the superior aspect of the fungating mass.  Bleeding was controlled.  Because applied along with a pressure dressing.  Patient has not bled through these dressings.  Patient is anemic here with hemoglobin of 6.1.  She will require admission for blood transfusion.  Final Clinical Impressions(s) / ED Diagnoses   Final diagnoses:  Acute bleeding  Blood loss anemia  Malignant neoplasm of left female breast, unspecified estrogen receptor status, unspecified site of breast [facility])    New Prescriptions New Prescriptions   No medications on file     Jola Schmidt, MD 01/18/17 [id number]

## 2017-01-18 NOTE — ED Triage Notes (Signed)
Per EMS pt from home with c/o heavy bleeding at the cancer site (leftr breast). Per EMS pt already had a dressing on which they have reinforced. Denies pain, or any other symptoms at this time.

## 2017-01-18 NOTE — ED Notes (Signed)
Patient arrives by EMS with uncontrolled bleeding from left chest mass-necrotic from breast cancer. Dr. Venora Maples at bedside to evaluate patient. 6 layers of dressing removed from left necrotic chest mass/alongside left lateral breast area. Moderate bleeding-at areas noon and 1800. Those areas sutured by Dr. Venora Maples and quick clot dressings applied and bulky dressings over quick clot and 2x2's and 4x4's. Patient cleaned and gown and warm blankets applied-PICC line accessed and patient medicated for nausea and pain. Patient had 2 episodes of dry heaving after sutures applied.

## 2017-01-18 NOTE — Telephone Encounter (Signed)
No 8/24 los  

## 2017-01-18 NOTE — ED Notes (Signed)
No respiratory or acute distress noted alert and oriented x 3 call light in reach no active bleeding noted.

## 2017-01-18 NOTE — ED Notes (Signed)
Bed: YJ09 Expected date:  Expected time:  Means of arrival:  Comments: EMS breast cancer/bleeding

## 2017-01-18 NOTE — ED Notes (Signed)
No respiratory or acute distress noted alert and oriented x 3 no reaction to medication noted no active bleeding noted call light in reach.

## 2017-01-19 DIAGNOSIS — C50412 Malignant neoplasm of upper-outer quadrant of left female breast: Principal | ICD-10-CM

## 2017-01-19 DIAGNOSIS — Z170 Estrogen receptor positive status [ER+]: Secondary | ICD-10-CM | POA: Diagnosis not present

## 2017-01-19 DIAGNOSIS — C7800 Secondary malignant neoplasm of unspecified lung: Secondary | ICD-10-CM | POA: Diagnosis present

## 2017-01-19 DIAGNOSIS — Z6820 Body mass index (BMI) 20.0-20.9, adult: Secondary | ICD-10-CM | POA: Diagnosis not present

## 2017-01-19 DIAGNOSIS — C787 Secondary malignant neoplasm of liver and intrahepatic bile duct: Secondary | ICD-10-CM | POA: Diagnosis present

## 2017-01-19 DIAGNOSIS — Z9221 Personal history of antineoplastic chemotherapy: Secondary | ICD-10-CM | POA: Diagnosis not present

## 2017-01-19 DIAGNOSIS — R000 Tachycardia, unspecified: Secondary | ICD-10-CM | POA: Diagnosis present

## 2017-01-19 DIAGNOSIS — Z79899 Other long term (current) drug therapy: Secondary | ICD-10-CM | POA: Diagnosis not present

## 2017-01-19 DIAGNOSIS — E440 Moderate protein-calorie malnutrition: Secondary | ICD-10-CM | POA: Diagnosis not present

## 2017-01-19 DIAGNOSIS — C50911 Malignant neoplasm of unspecified site of right female breast: Secondary | ICD-10-CM

## 2017-01-19 DIAGNOSIS — D500 Iron deficiency anemia secondary to blood loss (chronic): Secondary | ICD-10-CM | POA: Diagnosis not present

## 2017-01-19 DIAGNOSIS — C50512 Malignant neoplasm of lower-outer quadrant of left female breast: Secondary | ICD-10-CM | POA: Diagnosis not present

## 2017-01-19 DIAGNOSIS — E43 Unspecified severe protein-calorie malnutrition: Secondary | ICD-10-CM | POA: Diagnosis present

## 2017-01-19 DIAGNOSIS — D638 Anemia in other chronic diseases classified elsewhere: Secondary | ICD-10-CM | POA: Diagnosis present

## 2017-01-19 DIAGNOSIS — K529 Noninfective gastroenteritis and colitis, unspecified: Secondary | ICD-10-CM | POA: Diagnosis present

## 2017-01-19 DIAGNOSIS — D62 Acute posthemorrhagic anemia: Secondary | ICD-10-CM | POA: Diagnosis present

## 2017-01-19 DIAGNOSIS — R42 Dizziness and giddiness: Secondary | ICD-10-CM | POA: Diagnosis not present

## 2017-01-19 DIAGNOSIS — N644 Mastodynia: Secondary | ICD-10-CM | POA: Diagnosis present

## 2017-01-19 LAB — CBC
HCT: 28.4 % — ABNORMAL LOW (ref 36.0–46.0)
HEMOGLOBIN: 9.3 g/dL — AB (ref 12.0–15.0)
MCH: 26.6 pg (ref 26.0–34.0)
MCHC: 32.7 g/dL (ref 30.0–36.0)
MCV: 81.1 fL (ref 78.0–100.0)
PLATELETS: 348 10*3/uL (ref 150–400)
RBC: 3.5 MIL/uL — AB (ref 3.87–5.11)
RDW: 15.4 % (ref 11.5–15.5)
WBC: 13.7 10*3/uL — AB (ref 4.0–10.5)

## 2017-01-19 LAB — BPAM RBC
BLOOD PRODUCT EXPIRATION DATE: 201809062359
Blood Product Expiration Date: 201809132359
ISSUE DATE / TIME: 201808271002
ISSUE DATE / TIME: 201808270750
UNIT TYPE AND RH: 6200
Unit Type and Rh: 6200

## 2017-01-19 LAB — BASIC METABOLIC PANEL
ANION GAP: 5 (ref 5–15)
BUN: 11 mg/dL (ref 6–20)
CALCIUM: 8.6 mg/dL — AB (ref 8.9–10.3)
CO2: 28 mmol/L (ref 22–32)
CREATININE: 0.74 mg/dL (ref 0.44–1.00)
Chloride: 103 mmol/L (ref 101–111)
Glucose, Bld: 102 mg/dL — ABNORMAL HIGH (ref 65–99)
Potassium: 4.2 mmol/L (ref 3.5–5.1)
SODIUM: 136 mmol/L (ref 135–145)

## 2017-01-19 LAB — TYPE AND SCREEN
ABO/RH(D): A POS
Antibody Screen: NEGATIVE
UNIT DIVISION: 0
Unit division: 0

## 2017-01-19 MED ORDER — HYDROMORPHONE HCL-NACL 0.5-0.9 MG/ML-% IV SOSY
1.0000 mg | PREFILLED_SYRINGE | INTRAVENOUS | Status: DC | PRN
  Administered 2017-01-19 – 2017-01-22 (×13): 1 mg via INTRAVENOUS
  Filled 2017-01-19 (×13): qty 2

## 2017-01-19 MED ORDER — PROMETHAZINE HCL 25 MG/ML IJ SOLN
12.5000 mg | Freq: Four times a day (QID) | INTRAMUSCULAR | Status: DC | PRN
  Administered 2017-01-19: 12.5 mg via INTRAVENOUS
  Filled 2017-01-19: qty 1

## 2017-01-19 MED ORDER — SILVER NITRATE-POT NITRATE 75-25 % EX MISC
1.0000 | Freq: Once | CUTANEOUS | Status: DC
  Filled 2017-01-19: qty 1

## 2017-01-19 MED ORDER — HYDROMORPHONE HCL-NACL 0.5-0.9 MG/ML-% IV SOSY
1.0000 mg | PREFILLED_SYRINGE | Freq: Once | INTRAVENOUS | Status: AC
  Administered 2017-01-19: 1 mg via INTRAVENOUS
  Filled 2017-01-19: qty 2

## 2017-01-19 NOTE — Progress Notes (Signed)
Thanks for the FYI

## 2017-01-19 NOTE — Consult Note (Signed)
[place] Nurse wound consult note Reason for Consult:Fungating breast tumor in left axillae; s/p suture placement in ED yesterday with placement of a pressure dressing.  Order received for first dressing change today. Wound type: Neoplastic Pressure Injury POA: N/A Measurement:16cm x 16cm with elevation of 7cm Wound ONG:[id number] with slough, eschar, tumor evident. Vascular, friable. Drainage (amount, consistency, odor) Frank bleeding. Periwound: intact, dry. Evidence of medical adhesive related skin injury (MARSI) on back from tape. Patient requests minimal tape be used to secure dressings. Dressing procedure/placement/frequency: Upon take down of pressure dressing and after saturation of gauze with NS, patient began bleeding and direct pressure held for 10 minutes, then 20 minutes without looking did not stop it.  CCS PA contacedt for urgent albeit not emergent assistance and that provider brought coagulation dressing from OR. Dr. Zella Richer in to assess and directs [place] to leave wound contact layer gauze in place and to top that with the coagulation dressing. Three pieces of coagulation dressing placed over and circumferentially around tumor. Bleeding stops. We cover with dry gauze 4x4s, ABD pads and secure with Kerlix roll gauze to hold in place without placing medical adhesive on skin. Dr. Zella Richer requests two sizes of breast binders (Medium and Large) to be applied to chest. Bedside cautery pen is another option for the management of continued bleeding. Silver nitrate sticks are also at bedside.  CCS PA and I will plan to change this dressing again tomorrow morning. That provider and Dr. Bertrum Sol assistance and expertise are appreciated.  [facility] nursing team will follow, and will remain available to this patient, the nursing, surgical and medical teams.   Thanks, Maudie Flakes, MSN, RN, [place], Arther Abbott  Pager# [phone]

## 2017-01-19 NOTE — Progress Notes (Addendum)
PROGRESS NOTE    Ann Oliver  FTD:[id number] DOB: January 31, 1956 DOA: 01/18/2017 PCP: [facility]  Brief Narrative: Ann Oliver is a 61 y.o. 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 Of note, she has been evaluated in the emergency department multiple times for multiple dressing changes. She also follows with wound care center and wound care at home. Due to ongoing bleeding, EDP was unable to control bleeding with direct pressure and therefore 3 figure-of-eight stitches were placed in 2 areas of bleeding after this compressive dressing placed. Bleeding was controlled. 2u pRBC transfusion ordered due to Hgb 6.1 and admitted to [facility].  Assessment & Plan:    Large Fungating breast mass with bleeding -clinical stage 4 per Dr.Gudena's note on palliative Chemo-taxol every 3 weeks -followed at the wound center -with local bleeding/oozing -WOund RN following -CCS consult greatly appreciate-may need cautery in OR if doesn't improve with this -s/p 2units PRBC, may need more, check CBC q12  ACute blood loss anemia -as above  Severe malnutrition -supplements as tol  Chronic diarrhea -on imodium PRN  DVT prophylaxis: SCds Code Status: Full Code Family Communication: none at bedside Disposition Plan: Home when improved/bleeding subsides  Consultants:   CCS  Wound RN      Subjective: Was doing ok this morning, dressing soaked and changed overnight by RN and compressive dressing applied, awaiting Wound RN eval  Objective: Vitals:   01/18/17 1242 01/18/17 1252 01/18/17 2120 01/19/17 0508  BP: (!) 141/50 (!) 144/80 (!) 149/71 140/79  Pulse: 86 85 89 78  Resp: 16  17 18    Temp: 98.7 F (37.1 C) 98 F (36.7 C) 98.1 F (36.7 C) 98 F (36.7 C)  TempSrc: Oral Oral Oral Oral  SpO2: 100% 100% 100% 100%  Weight:      Height:        Intake/Output Summary (Last 24 hours) at 01/19/17 1218 Last data filed at 01/18/17 1500  Gross per 24 hour  Intake              605 ml  Output                0 ml  Net              605 ml   Filed Weights   01/18/17 0951  Weight: 50 kg (110 lb 3.7 oz)    Examination:  General exam: cachectic thinly built female Respiratory system: CTAB Chest: L breast with large grapefruit sized fungating mass with blood oozing-seen with Wound RN Cardiovascular system: S1 & S2 heard, RRR. No JVD, murmurs Gastrointestinal system: Abdomen is nondistended, soft and nontender. No organomegaly or masses felt. Normal bowel sounds heard. Central nervous system: Alert and oriented. No focal neurological deficits. Extremities: Symmetric 5 x 5 power. Psychiatry: flat affect    Data Reviewed:   CBC:  Recent Labs Lab 01/15/17 1045 01/18/17 0543 01/19/17 0414  WBC 12.5* 12.9* 11.4*  NEUTROABS 10.7*  --   --   HGB 8.8* 6.1* 8.8*  HCT 27.6* 19.0* 26.3*  MCV 79.9 80.5 81.2  PLT 569* 338 062   Basic Metabolic Panel:  Recent Labs Lab 01/15/17 1045 01/18/17 0431 01/19/17 0414  NA 139 139 136  K 4.2 3.5  4.2  CL  --  107 103  CO2 26 25 28   GLUCOSE 91 148* 102*  BUN 12.9 16 11   CREATININE 0.8 0.64 0.74  CALCIUM 9.6 8.2* 8.6*  MG 1.9  --   --    GFR: Estimated Creatinine Clearance: 55 mL/min (by C-G formula based on SCr of 0.74 mg/dL). Liver Function Tests:  Recent Labs Lab 01/15/17 1045  AST 21  ALT 13  ALKPHOS 92  BILITOT 0.24  PROT 6.8  ALBUMIN 3.0*   No results for input(s): LIPASE, AMYLASE in the last 168 hours. No results for input(s): AMMONIA in the last 168 hours. Coagulation Profile:  Recent Labs Lab 01/18/17 0431  INR 0.99   Cardiac Enzymes: No results for input(s): CKTOTAL, CKMB, CKMBINDEX,  TROPONINI in the last 168 hours. BNP (last 3 results) No results for input(s): PROBNP in the last 8760 hours. HbA1C: No results for input(s): HGBA1C in the last 72 hours. CBG: No results for input(s): GLUCAP in the last 168 hours. Lipid Profile: No results for input(s): CHOL, HDL, LDLCALC, TRIG, CHOLHDL, LDLDIRECT in the last 72 hours. Thyroid Function Tests: No results for input(s): TSH, T4TOTAL, FREET4, T3FREE, THYROIDAB in the last 72 hours. Anemia Panel: No results for input(s): VITAMINB12, FOLATE, FERRITIN, TIBC, IRON, RETICCTPCT in the last 72 hours. Urine analysis:    Component Value Date/Time   COLORURINE YELLOW 09/25/2016 0230   APPEARANCEUR CLEAR 09/25/2016 0230   LABSPEC 1.019 09/25/2016 0230   PHURINE 5.0 09/25/2016 0230   GLUCOSEU NEGATIVE 09/25/2016 0230   HGBUR NEGATIVE 09/25/2016 0230   BILIRUBINUR NEGATIVE 09/25/2016 0230   KETONESUR 5 (A) 09/25/2016 0230   PROTEINUR NEGATIVE 09/25/2016 0230   NITRITE NEGATIVE 09/25/2016 0230   LEUKOCYTESUR NEGATIVE 09/25/2016 0230   Sepsis Labs: @LABRCNTIP (procalcitonin:4,lacticidven:4)  )No results found for this or any previous visit (from the past 240 hour(s)).       Radiology Studies: No results found.      Scheduled Meds: . feeding supplement (ENSURE ENLIVE)  237 mL Oral TID BM  . silver nitrate applicators  1 Stick Topical Once   Continuous Infusions:   LOS: 0 days    Time spent: 59min    Domenic Polite, MD [facility] Pager [phone]  If 7PM-7AM, please contact night-coverage [email or web address] Password [facility] 01/19/2017, 12:18 PM

## 2017-01-19 NOTE — Consult Note (Signed)
[facility] Consult Note  Ann Oliver [birth year]  [id number].    Requesting MD: Domenic Polite Chief Complaint/Reason for Consult: bleeding fungating breast mass  HPI:  Ann Oliver is a 61yo female known to our service with breast cancer diagnosed 09/2015 on mammogram. Unfortunately Ann Oliver neglected her diagnosis and the cancer progressed to stage IV with pulmonary and liver mets. She started palliative treatment with Taxol every 3 weeks 11/18/2016 to try and shrink the mass. She has been receiving home health for wound care, but continues to come to the ED due to bleeding from breast mass. Ann Oliver returned to ED yesterday where EDP was unable to stop bleeding with direct pressure, therefore decided to apply 3 figure-of-eight sutures and a pressure dressing. Hemoglobin was 6.1 so she was admitted and received 2 uPRBC. During dressing change today with WOC bleeding was unable to be stopped and general surgery was consulted.  ROS: Review of Systems  Constitutional: Negative.   HENT: Negative.   Eyes: Negative.   Respiratory: Negative.   Cardiovascular: Negative.   Gastrointestinal: Negative.   Genitourinary: Negative.   Musculoskeletal: Negative.   Skin:       Left breast mass bleeding  Neurological: Negative.   All systems reviewed and otherwise negative except for as above  Family History  Problem Relation Age of Onset  . Breast cancer Neg Hx     Past Medical History:  Diagnosis Date  . Cancer ([place])   . Sinusitis   . Tumor cells     Past Surgical History:  Procedure Laterality Date  . BIOPSY BREAST    . IR FLUORO GUIDE CV LINE RIGHT  11/06/2016  . IR US GUIDE VASC ACCESS RIGHT  11/06/2016    Social History:  reports that she has never smoked. She has never used smokeless tobacco. She reports that she does not drink alcohol or use drugs.  Allergies:  Allergies  Allergen Reactions  . Aspirin Palpitations    Medications Prior to Admission   Medication Sig Dispense Refill  . diphenoxylate-atropine (LOMOTIL) 2.5-0.025 MG tablet Take 1 tablet by mouth 4 (four) times daily as needed for diarrhea or loose stools. 30 tablet 0  . feeding supplement, ENSURE ENLIVE, (ENSURE ENLIVE) LIQD Take 237 mLs by mouth 3 (three) times daily between meals. 30 Bottle 0  . ibuprofen (ADVIL,MOTRIN) 200 MG tablet Take 400-600 mg by mouth every 6 (six) hours as needed for mild pain.     Marland Kitchen loperamide (IMODIUM) 2 MG capsule Take 1 capsule (2 mg total) by mouth as needed for diarrhea or loose stools. Take as directed on package 30 capsule 0  . Metronidazole Benzoate POWD 500 mg by Does not apply route 3 (three) times a week. Apply powder topically directly to wound with each dressing change three times a week. 1 Bottle 0  . ondansetron (ZOFRAN) 8 MG tablet Take 1 tablet (8 mg total) by mouth every 8 (eight) hours as needed for nausea or vomiting. 20 tablet 0  . oxyCODONE-acetaminophen (PERCOCET/ROXICET) 5-325 MG tablet Take 2 tablets by mouth every 6 (six) hours as needed for severe pain. Use for severe pain - Do not take with Tylenol as this tablet already contains tylenol 60 tablet 0    Prior to Admission medications   Medication Sig Start Date End Date Taking? Authorizing Provider  diphenoxylate-atropine (LOMOTIL) 2.5-0.025 MG tablet Take 1 tablet by mouth 4 (four) times daily as needed for diarrhea or loose stools. 01/11/17  Yes Causey, Charlestine Massed, NP  feeding supplement, ENSURE ENLIVE, (ENSURE ENLIVE) LIQD Take 237 mLs by mouth 3 (three) times daily between meals. 09/26/16  Yes [name], MD  ibuprofen (ADVIL,MOTRIN) 200 MG tablet Take 400-600 mg by mouth every 6 (six) hours as needed for mild pain.    Yes [provider]  loperamide (IMODIUM) 2 MG capsule Take 1 capsule (2 mg total) by mouth as needed for diarrhea or loose stools. Take as directed on package 01/06/17  Yes Magrinat, Virgie Dad, MD  Metronidazole Benzoate POWD 500 mg by Does  not apply route 3 (three) times a week. Apply powder topically directly to wound with each dressing change three times a week. 01/15/17  Yes Nicholas Lose, MD  ondansetron (ZOFRAN) 8 MG tablet Take 1 tablet (8 mg total) by mouth every 8 (eight) hours as needed for nausea or vomiting. 12/29/16  Yes Nicholas Lose, MD  oxyCODONE-acetaminophen (PERCOCET/ROXICET) 5-325 MG tablet Take 2 tablets by mouth every 6 (six) hours as needed for severe pain. Use for severe pain - Do not take with Tylenol as this tablet already contains tylenol 01/15/17  Yes Nicholas Lose, MD    Blood pressure 140/79, pulse 78, temperature 98 F (36.7 C), temperature source Oral, resp. rate 18, height 5' 1"  (1.549 m), weight 110 lb 3.7 oz (50 kg), SpO2 100 %. Physical Exam: General: pleasant, chronically ill appearing AA female who is sitting up in bed in NAD HEENT: head is normocephalic, atraumatic.  Sclera are noninjected.  Pupils equal and round.  Ears and nose without any masses or lesions.  Mouth is pink and moist. Dentition fair Heart: regular, rate, and rhythm.  No obvious murmurs, gallops, or rubs noted.  Palpable pedal pulses bilaterally Lungs: CTAB, no wheezes, rhonchi, or rales noted.  Respiratory effort nonlabored Abd: soft, NT/ND, +BS, no masses, hernias, or organomegaly MS: all 4 extremities are symmetrical with no cyanosis, clubbing, or edema. +muscle wasting of all 4 extremities Skin: warm and dry with no masses, lesions, or rashes Psych: A&Ox3 with an appropriate affect. Neuro: cranial nerves grossly intact, extremity CSM intact bilaterally, normal speech Chest: Large left chest wall/axillary mass that is firm and necrotic. Active bleeding noted from superomedial and superolateral areas, unable to fully control with direct pressure. No signs of cellulitis. No foul smell.  Results for orders placed or performed during the hospital encounter of 01/18/17 (from the past 48 hour(s))  Basic metabolic panel     Status:  Abnormal   Collection Time: 01/18/17  4:31 AM  Result Value Ref Range   Sodium 139 135 - 145 mmol/L   Potassium 3.5 3.5 - 5.1 mmol/L   Chloride 107 101 - 111 mmol/L   CO2 25 22 - 32 mmol/L   Glucose, Bld 148 (H) 65 - 99 mg/dL   BUN 16 6 - 20 mg/dL   Creatinine, Ser 0.64 0.44 - 1.00 mg/dL   Calcium 8.2 (L) 8.9 - 10.3 mg/dL   GFR calc non Af Amer >60 >60 mL/min   GFR calc Af Amer >60 >60 mL/min    Comment: (NOTE) The eGFR has been calculated using the CKD EPI equation. This calculation has not been validated in all clinical situations. eGFR's persistently <60 mL/min signify possible Chronic Kidney Disease.    Anion gap 7 5 - 15  Protime-INR     Status: None   Collection Time: 01/18/17  4:31 AM  Result Value Ref Range   Prothrombin Time 13.1 11.4 - 15.2 seconds   INR 0.99  Type and screen [place]     Status: None   Collection Time: 01/18/17  4:31 AM  Result Value Ref Range   ABO/RH(D) A POS    Antibody Screen NEG    Sample Expiration 01/21/2017    Unit Number [id number]    Blood Component Type RED CELLS,LR    Unit division 00    Status of Unit ISSUED,FINAL    Transfusion Status OK TO TRANSFUSE    Crossmatch Result Compatible    Unit Number [id number]    Blood Component Type RED CELLS,LR    Unit division 00    Status of Unit ISSUED,FINAL    Transfusion Status OK TO TRANSFUSE    Crossmatch Result Compatible   CBC     Status: Abnormal   Collection Time: 01/18/17  5:43 AM  Result Value Ref Range   WBC 12.9 (H) 4.0 - 10.5 K/uL   RBC 2.36 (L) 3.87 - 5.11 MIL/uL   Hemoglobin 6.1 (LL) 12.0 - 15.0 g/dL    Comment: REPEATED TO VERIFY SPECIMEN CHECKED FOR CLOTS RESULTS VERIFIED VIA RECOLLECT CRITICAL RESULT CALLED TO, READ BACK BY AND VERIFIED WITH: T.DOSTER RN [id number] A.QUIZON    HCT 19.0 (L) 36.0 - 46.0 %   MCV 80.5 78.0 - 100.0 fL   MCH 25.8 (L) 26.0 - 34.0 pg   MCHC 32.1 30.0 - 36.0 g/dL   RDW 16.4 (H) 11.5 - 15.5 %   Platelets 338  150 - 400 K/uL  Prepare RBC     Status: None   Collection Time: 01/18/17  7:00 AM  Result Value Ref Range   Order Confirmation ORDER PROCESSED BY BLOOD BANK   CBC     Status: Abnormal   Collection Time: 01/19/17  4:14 AM  Result Value Ref Range   WBC 11.4 (H) 4.0 - 10.5 K/uL   RBC 3.24 (L) 3.87 - 5.11 MIL/uL   Hemoglobin 8.8 (L) 12.0 - 15.0 g/dL    Comment: POST TRANSFUSION SPECIMEN   HCT 26.3 (L) 36.0 - 46.0 %   MCV 81.2 78.0 - 100.0 fL   MCH 27.2 26.0 - 34.0 pg   MCHC 33.5 30.0 - 36.0 g/dL   RDW 15.5 11.5 - 15.5 %   Platelets 328 150 - 400 K/uL  Basic metabolic panel     Status: Abnormal   Collection Time: 01/19/17  4:14 AM  Result Value Ref Range   Sodium 136 135 - 145 mmol/L   Potassium 4.2 3.5 - 5.1 mmol/L   Chloride 103 101 - 111 mmol/L   CO2 28 22 - 32 mmol/L   Glucose, Bld 102 (H) 65 - 99 mg/dL   BUN 11 6 - 20 mg/dL   Creatinine, Ser 0.74 0.44 - 1.00 mg/dL   Calcium 8.6 (L) 8.9 - 10.3 mg/dL   GFR calc non Af Amer >60 >60 mL/min   GFR calc Af Amer >60 >60 mL/min    Comment: (NOTE) The eGFR has been calculated using the CKD EPI equation. This calculation has not been validated in all clinical situations. eGFR's persistently <60 mL/min signify possible Chronic Kidney Disease.    Anion gap 5 5 - 15   No results found.  Anti-infectives    None       Assessment/Plan Metastatic breast cancer with fungating left breast mass - diagnosed 09/2015 on mammogram; biopsy 10/22/2015 proved breast mass to be invasive adenocarcinoma grade 2-3 with a Ki-67 30% that was ER positive PR negative - Ann Oliver  neglected her diagnosis, and now her cancer has progressed to stage IV with pulmonary and liver mets - started palliative treatment with Taxol every 3 weeks 11/18/2016 to try and shrink the mass, followed by [name] - getting wound care at home, but having to come to ED frequently due to bleeding  ABL anemia - Hg 6.1 on arrival 8/27 and Ann Oliver received 2 uPRBC. Hg now 8.8.  Transfuse PRN per primary  ID - none VTE - SCDs, no chemical DVT prophylaxis due to active bleeding FEN - regular diet Foley - none Follow up - TBD  Plan - Seen and evaluated with Dr. Zella Richer. Surgicel was applied over entirety of mass followed by compressive dressing with gauze and kerlex. I have ordered a breast binder to apply over top of dressing. Plan to re-evalute wound tomorrow with Ann Oliver WOC.  Wellington Hampshire, [facility] Surgery 01/19/2017, 12:42 PM Pager: [phone] Consults: [phone] Mon-Fri 7:00 am-4:30 pm Sat-Sun 7:00 am-11:30 am

## 2017-01-20 ENCOUNTER — Other Ambulatory Visit: Payer: Self-pay

## 2017-01-20 ENCOUNTER — Ambulatory Visit: Payer: Self-pay

## 2017-01-20 ENCOUNTER — Ambulatory Visit: Payer: Self-pay | Admitting: Hematology and Oncology

## 2017-01-20 LAB — CBC
HCT: 26 % — ABNORMAL LOW (ref 36.0–46.0)
HEMATOCRIT: 26.3 % — AB (ref 36.0–46.0)
Hemoglobin: 8.8 g/dL — ABNORMAL LOW (ref 12.0–15.0)
Hemoglobin: 8.6 g/dL — ABNORMAL LOW (ref 12.0–15.0)
MCH: 27.2 pg (ref 26.0–34.0)
MCH: 27 pg (ref 26.0–34.0)
MCHC: 33.5 g/dL (ref 30.0–36.0)
MCHC: 33.1 g/dL (ref 30.0–36.0)
MCV: 81.2 fL (ref 78.0–100.0)
MCV: 81.8 fL (ref 78.0–100.0)
PLATELETS: 343 10*3/uL (ref 150–400)
Platelets: 328 10*3/uL (ref 150–400)
RBC: 3.24 MIL/uL — ABNORMAL LOW (ref 3.87–5.11)
RBC: 3.18 MIL/uL — ABNORMAL LOW (ref 3.87–5.11)
RDW: 15.5 % (ref 11.5–15.5)
RDW: 15.6 % — AB (ref 11.5–15.5)
WBC: 11.4 10*3/uL — AB (ref 4.0–10.5)
WBC: 11.8 10*3/uL — ABNORMAL HIGH (ref 4.0–10.5)

## 2017-01-20 NOTE — Progress Notes (Signed)
Patient ID: Ann Oliver, female   DOB: [birth year], 61 y.o.   MRN: [id number]  [facility] Surgery Progress Note     Subjective: CC- bleeding breast mass Patient states that the dressing did get saturated with purulent drainage/minimal blood over night and dressing was reinforced. Continues to have pain from this area.  Tolerating regular diet.  Hg 8.6 this morning from 9.3 yesterday, she did not require subsequent transfusions today.   Objective: Vital signs in last 24 hours: Temp:  [98.4 F (36.9 C)-98.9 F (37.2 C)] 98.9 F (37.2 C) (08/29 0605) Pulse Rate:  [85-91] 85 (08/29 0605) Resp:  [16] 16 (08/29 0605) BP: (123-128)/(81-83) 123/81 (08/29 0605) SpO2:  [100 %] 100 % (08/29 0605) Last BM Date: 01/17/17  Intake/Output from previous day: 08/28 0701 - 08/29 0700 In: 240 [P.O.:240] Out: -  Intake/Output this shift: No intake/output data recorded.  PE: Gen:  Alert, NAD, pleasant HEENT: EOM's intact, pupils equal and round Card:  RRR, no M/G/R heard Pulm:  CTAB, no W/R/R, effort normal Abd: Soft, NT/ND, +BS, no HSM, no hernia Ext:  No erythema, edema, or tenderness BUE/BLE. +muscle wasting all 4 extremities Psych: A&Ox3  Skin: no rashes noted, warm and dry Chest: Large left chest wall/axillary mass that is firm and necrotic. Upon removing dressing there was active bleeding noted from superomedial and superolateral areas. No signs of cellulitis but there was a foul odor. >> new dressing applied with Surgical powder, 4 sheets of Surgicel, several gauze/kerlex, and breast binder  Lab Results:   Recent Labs  01/19/17 1450 01/20/17 0440  WBC 13.7* 11.8*  HGB 9.3* 8.6*  HCT 28.4* 26.0*  PLT 348 343   BMET  Recent Labs  01/18/17 0431 01/19/17 0414  NA 139 136  K 3.5 4.2  CL 107 103  CO2 25 28  GLUCOSE 148* 102*  BUN 16 11  CREATININE 0.64 0.74  CALCIUM 8.2* 8.6*   PT/INR  Recent Labs  01/18/17 0431  LABPROT 13.1  INR 0.99   CMP      Component Value Date/Time   NA 136 01/19/2017 0414   NA 139 01/15/2017 1045   K 4.2 01/19/2017 0414   K 4.2 01/15/2017 1045   CL 103 01/19/2017 0414   CO2 28 01/19/2017 0414   CO2 26 01/15/2017 1045   GLUCOSE 102 (H) 01/19/2017 0414   GLUCOSE 91 01/15/2017 1045   BUN 11 01/19/2017 0414   BUN 12.9 01/15/2017 1045   CREATININE 0.74 01/19/2017 0414   CREATININE 0.8 01/15/2017 1045   CALCIUM 8.6 (L) 01/19/2017 0414   CALCIUM 9.6 01/15/2017 1045   PROT 6.8 01/15/2017 1045   ALBUMIN 3.0 (L) 01/15/2017 1045   AST 21 01/15/2017 1045   ALT 13 01/15/2017 1045   ALKPHOS 92 01/15/2017 1045   BILITOT 0.24 01/15/2017 1045   GFRNONAA >60 01/19/2017 0414   GFRAA >60 01/19/2017 0414   Lipase     Component Value Date/Time   LIPASE 29 10/28/2016 1644       Studies/Results: No results found.  Anti-infectives: Anti-infectives    None       Assessment/Plan Metastatic breast cancer with fungating left breast mass - diagnosed 09/2015 on mammogram; biopsy 10/22/2015 proved breast massto be invasive adenocarcinoma grade 2-3 with a Ki-67 30% that was ER positive PR negative - patient neglected her diagnosis, and now her cancer has progressed to stage IV with pulmonary and liver mets - started palliative treatment with Taxol every 3 weeks 11/18/2016 to  try and shrink the mass, followed by [name] - getting wound care at home, but having to come to ED frequently due to bleeding  ABL anemia - s/p 2 [facility] 8/27. Hg now 8.6, stable. Transfuse PRN per primary  ID - none VTE - SCDs, no chemical DVT prophylaxis due to active bleeding FEN - regular diet Foley - none Follow up - TBD  Plan - Dressing changed with Dr. Zella Richer. Applied Surgicel powder and 4 sheets of Surgicel followed by gauze, kerlex, and breast binder. Reinforce dressing as needed for saturation. I will plan to change dressing again in 24 hours (leaving Surgicel and changing gauze/kerlex).   LOS: 1 day    Wellington Hampshire , [facility] 01/20/2017, 3:34 PM Pager: [phone] Consults: [phone] Mon-Fri 7:00 am-4:30 pm Sat-Sun 7:00 am-11:30 am

## 2017-01-20 NOTE — Progress Notes (Addendum)
PROGRESS NOTE    [name]  TKW:[id number] DOB: [birth year] DOA: 01/18/2017 PCP: Center, [facility]     Brief Narrative:  Ann Oliver is a 61 y.o. female with medical history significant of left-sided fungating breast cancer, currently receiving palliative chemotherapy, who presents to the hospital due to ongoing bleeding of the left breast mass since Saturday. She states that it had stopped on its own, but restarted to bleed day before admission. In the ED, 3 sutures placed and compressive dressing placed, bleeding was controlled. 2 units of packed red blood cells were ordered. During dressing change yesterday, patient had significant bleeding from site despite direct pressure for 30 minutes. Gen. surgery was consulted.  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, currently receiving palliative Taxol every 3 weeks -Receives wound care at home and also presents to the ED frequently for dressing changes  -3 sutures and compressive dressing placed in ED, Surgicel applied followed by compressive dressing and breast binder by CCS 8/28  -Appreciate CCS and wound RN, await further dressing changes   Chronic diarrhea  -Continue home imodium   DVT prophylaxis: SCD Code Status: Full Family Communication: No family at bedside  Disposition Plan: Pending improvement    Consultants:   CCS  Procedures:   None   Antimicrobials:  Anti-infectives    None        Subjective: Patient without  any complaints today. Has some pain around her breast mass, no acute events overnight.  Objective: Vitals:   01/19/17 0508 01/19/17 1400 01/19/17 2108 01/20/17 0605  BP: 140/79 135/78 128/83 123/81  Pulse: 78 82 91 85  Resp: 18 18 16 16   Temp: 98 F (36.7 C) 98.2 F (36.8 C) 98.4 F (36.9 C) 98.9 F (37.2 C)  TempSrc: Oral Oral Oral Oral  SpO2: 100% 100% 100% 100%  Weight:      Height:       No intake or output data in the 24 hours ending 01/20/17 1259 Filed Weights   01/18/17 0951  Weight: 50 kg (110 lb 3.7 oz)    Examination:  General exam: Appears calm and comfortable  Respiratory system: Clear to auscultation. Respiratory effort normal. Cardiovascular system: S1 & S2 heard, RRR. No JVD, murmurs, rubs, gallops or clicks. No pedal edema. Gastrointestinal system: Abdomen is nondistended, soft and nontender. No organomegaly or masses felt. Normal bowel sounds heard. Central nervous system: Alert and oriented. No focal neurological deficits. Extremities: Symmetric 5 x 5 power. Skin: +large mass left breast and axilla. Dressing is clean and dry with breast binder in place  Psychiatry: Judgement and insight appear normal. Mood & affect appropriate.   Data Reviewed: I have personally reviewed following labs and imaging studies  CBC:  Recent Labs Lab 01/15/17 1045 01/18/17 0543 01/19/17 0414 01/19/17 1450 01/20/17 0440  WBC 12.5* 12.9* 11.4* 13.7* 11.8*  NEUTROABS 10.7*  --   --   --   --   HGB 8.8* 6.1* 8.8* 9.3* 8.6*  HCT 27.6* 19.0* 26.3* 28.4* 26.0*  MCV 79.9 80.5 81.2 81.1 81.8  PLT 569* 338 328 348 614   Basic Metabolic Panel:  Recent Labs Lab 01/15/17 1045 01/18/17 0431 01/19/17 0414  NA 139 139 136  K 4.2 3.5 4.2  CL  --  107 103  CO2 26 25 28   GLUCOSE 91 148* 102*  BUN 12.9 16 11   CREATININE 0.8 0.64 0.74  CALCIUM 9.6 8.2* 8.6*  MG 1.9  --   --    GFR: Estimated Creatinine Clearance: 55 mL/min (by C-G formula based on SCr of 0.74  mg/dL). Liver Function Tests:  Recent Labs Lab 01/15/17 1045  AST 21  ALT 13  ALKPHOS 92  BILITOT 0.24  PROT 6.8  ALBUMIN 3.0*   No results for input(s): LIPASE, AMYLASE in the last 168 hours. No results for input(s): AMMONIA in the last 168 hours. Coagulation Profile:  Recent Labs Lab 01/18/17 0431  INR 0.99   Cardiac Enzymes: No results for input(s): CKTOTAL, CKMB, CKMBINDEX, TROPONINI in the last 168 hours. BNP (last 3 results) No results for input(s): PROBNP in the last 8760 hours. HbA1C: No results for input(s): HGBA1C in the last 72 hours. CBG: No results for input(s): GLUCAP in the last 168 hours. Lipid Profile: No results for input(s): CHOL, HDL, LDLCALC, TRIG, CHOLHDL, LDLDIRECT in the last 72 hours. Thyroid Function Tests: No results for input(s): TSH, T4TOTAL, FREET4, T3FREE, THYROIDAB in the last 72 hours. Anemia Panel: No results for input(s): VITAMINB12, FOLATE, FERRITIN, TIBC, IRON, RETICCTPCT in the last 72 hours. Sepsis Labs: No results for input(s): PROCALCITON, LATICACIDVEN in the last 168 hours.  No results found for this or any previous visit (from the past 240 hour(s)).     Radiology Studies: No results found.    Scheduled Meds: . feeding supplement (ENSURE ENLIVE)  237 mL Oral TID BM  . silver nitrate applicators  1 Stick Topical Once   Continuous Infusions:   LOS: 1 day    Time spent: 30 minutes   Dessa Phi, DO [facility] [email or web address] Password [facility] 01/20/2017, 12:59 PM

## 2017-01-20 NOTE — Consult Note (Signed)
[place] Nurse wound follow up Wound type:Fungating breast tumor with recent bleeding, seen yesterday with Dr. Zella Richer and CCS PA [facility] (see note). Discussed with Ms. Meuth and it is not yet decided if the next dressing change will be this afternoon with she and Dr. Zella Richer performing or tomorrow morning due to desire to have coagulation dressing in place for 24 hours or longer.  I will stand by for direction on my involvement tomorrow as needed. Note: yesterday's dressing change did not involve [place] taking the ED dressing down to the wound bed due to bleeding and the need for placement of coagulation dressing (SurgiCel).  [facility] nursing team will follow along with you, and will remain available to this patient, the nursing, surgical and medical teams.   Thanks, Maudie Flakes, MSN, RN, [place], Arther Abbott  Pager# [phone]

## 2017-01-21 LAB — CBC
HEMATOCRIT: 26.3 % — AB (ref 36.0–46.0)
Hemoglobin: 8.7 g/dL — ABNORMAL LOW (ref 12.0–15.0)
MCH: 27.2 pg (ref 26.0–34.0)
MCHC: 33.1 g/dL (ref 30.0–36.0)
MCV: 82.2 fL (ref 78.0–100.0)
PLATELETS: 385 10*3/uL (ref 150–400)
RBC: 3.2 MIL/uL — ABNORMAL LOW (ref 3.87–5.11)
RDW: 15.8 % — AB (ref 11.5–15.5)
WBC: 8.9 10*3/uL (ref 4.0–10.5)

## 2017-01-21 NOTE — Progress Notes (Signed)
PROGRESS NOTE    Ann Oliver  JOI:[id number] DOB: [birth year] DOA: 01/18/2017 PCP: [facility], [facility]     Brief Narrative:  Ann Oliver is a 61 y.o. female with medical history significant of left-sided fungating breast cancer, currently receiving palliative chemotherapy, who presents to the hospital due to ongoing bleeding of the left breast mass since Saturday. She states that it had stopped on its own, but restarted to bleed day before admission. In the ED, 3 sutures placed and compressive dressing placed, bleeding was controlled. 2 units of packed red blood cells were ordered. During dressing change yesterday, patient had significant bleeding from site despite direct pressure for 30 minutes. Gen. surgery was consulted.  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, currently receiving palliative Taxol every 3 weeks -Receives wound care at home and also presents to the ED frequently for dressing changes  -3 sutures and compressive dressing placed in ED, Surgicel applied followed by compressive dressing and breast binder by CCS 8/28  -Appreciate CCS and wound RN, await further dressing changes   Chronic diarrhea  -Continue home imodium   DVT prophylaxis: SCD Code Status: Full Family Communication: No family at bedside  Disposition Plan: Pending improvement    Consultants:   CCS  Procedures:   None   Antimicrobials:  Anti-infectives    None       Subjective: Has some pain  around her breast mass, no acute events overnight. Some nausea, awaiting antiemetic prior to breakfast.   Objective: Vitals:   01/20/17 0605 01/20/17 1430 01/20/17 2223 01/21/17 0637  BP: 123/81 100/79 131/68 123/64  Pulse: 85 72 92 92  Resp: 16 18 16 16   Temp: 98.9 F (37.2 C) 98.3 F (36.8 C) 98.4 F (36.9 C) 98.2 F (36.8 C)  TempSrc: Oral Oral Oral Oral  SpO2: 100% 100% 100% 100%  Weight:      Height:        Intake/Output Summary (Last 24 hours) at 01/21/17 1152 Last data filed at 01/21/17 0910  Gross per 24 hour  Intake              600 ml  Output                0 ml  Net              600 ml   Filed Weights   01/18/17 0951  Weight: 50 kg (110 lb 3.7 oz)    Examination:  General exam: Appears calm and comfortable  Respiratory system: Clear to auscultation. Respiratory effort normal. Cardiovascular system: S1 & S2 heard, RRR. No JVD, murmurs, rubs, gallops or clicks. No pedal edema. Gastrointestinal system: Abdomen is nondistended, soft and nontender. No organomegaly or masses felt. Normal bowel sounds heard. Central nervous system: Alert and oriented. No focal neurological deficits. Extremities: Symmetric 5 x 5 power. Skin: +large mass left breast and axilla. Dressing is clean and dry with breast binder in place  Psychiatry: Judgement and insight appear normal. Mood & affect appropriate.  Data Reviewed: I have personally reviewed following labs and imaging studies  CBC:  Recent Labs Lab 01/15/17 1045 01/18/17 0543 01/19/17 0414 01/19/17 1450 01/20/17 0440 01/21/17 0600  WBC 12.5* 12.9* 11.4* 13.7* 11.8* 8.9  NEUTROABS 10.7*  --   --   --   --   --   HGB 8.8* 6.1* 8.8* 9.3* 8.6* 8.7*  HCT 27.6* 19.0* 26.3* 28.4* 26.0* 26.3*  MCV 79.9 80.5 81.2 81.1 81.8 82.2  PLT 569* 338 328 348 343 025   Basic Metabolic Panel:  Recent Labs Lab 01/15/17 1045 01/18/17 0431 01/19/17 0414  NA 139 139 136  K 4.2 3.5 4.2  CL  --  107 103  CO2 26 25 28   GLUCOSE 91  148* 102*  BUN 12.9 16 11   CREATININE 0.8 0.64 0.74  CALCIUM 9.6 8.2* 8.6*  MG 1.9  --   --    GFR: Estimated Creatinine Clearance: 55 mL/min (by C-G formula based on SCr of 0.74 mg/dL). Liver Function Tests:  Recent Labs Lab 01/15/17 1045  AST 21  ALT 13  ALKPHOS 92  BILITOT 0.24  PROT 6.8  ALBUMIN 3.0*   No results for input(s): LIPASE, AMYLASE in the last 168 hours. No results for input(s): AMMONIA in the last 168 hours. Coagulation Profile:  Recent Labs Lab 01/18/17 0431  INR 0.99   Cardiac Enzymes: No results for input(s): CKTOTAL, CKMB, CKMBINDEX, TROPONINI in the last 168 hours. BNP (last 3 results) No results for input(s): PROBNP in the last 8760 hours. HbA1C: No results for input(s): HGBA1C in the last 72 hours. CBG: No results for input(s): GLUCAP in the last 168 hours. Lipid Profile: No results for input(s): CHOL, HDL, LDLCALC, TRIG, CHOLHDL, LDLDIRECT in the last 72 hours. Thyroid Function Tests: No results for input(s): TSH, T4TOTAL, FREET4, T3FREE, THYROIDAB in the last 72 hours. Anemia Panel: No results for input(s): VITAMINB12, FOLATE, FERRITIN, TIBC, IRON, RETICCTPCT in the last 72 hours. Sepsis Labs: No results for input(s): PROCALCITON, LATICACIDVEN in the last 168 hours.  No results found for this or any previous visit (from the past 240 hour(s)).     Radiology Studies: No results found.    Scheduled Meds: . feeding supplement (ENSURE ENLIVE)  237 mL Oral TID BM  . silver nitrate applicators  1 Stick Topical Once   Continuous Infusions:   LOS: 2 days    Time spent: 20 minutes   Dessa Phi, DO [facility] [email or web address] Password [email or web address] 01/21/2017, 11:52 AM

## 2017-01-21 NOTE — Progress Notes (Signed)
Assessment Metastatic breast cancer with fungating left breast mass - diagnosed 09/2015 on mammogram; biopsy 10/22/2015 proved breast massto be invasive adenocarcinoma grade 2-3 with a Ki-67 30% that was ER positive PR negative - patient neglected her diagnosis, and now her cancer has progressed to stage IV with pulmonary and liver mets - started palliative treatment with Taxol every 3 weeks 11/18/2016 to try and shrink the mass, followed by [name] - getting wound care at home, but having to come to ED frequently due to bleeding ---wound without significant bleeding  ABL anemia -hemoglobin stable today  Plan:  Continue daily dry dressing change.  Leave Surgicel on wound.   LOS: 2 days        Chief Complaint/Subjective: Some drainage from wound but no significant bleeding.  Objective: Vital signs in last 24 hours: Temp:  [98.2 F (36.8 C)-98.4 F (36.9 C)] 98.2 F (36.8 C) (08/30 0637) Pulse Rate:  [92] 92 (08/30 0637) Resp:  [16] 16 (08/30 0637) BP: (123-131)/(64-68) 123/64 (08/30 0637) SpO2:  [100 %] 100 % (08/30 0637) Last BM Date: 01/19/17  Intake/Output from previous day: 08/29 0701 - 08/30 0700 In: 720 [P.O.:720] Out: -  Intake/Output this shift: Total I/O In: 120 [P.O.:120] Out: -   PE: General- In NAD.  Awake and alert. Left breast with fungating mass and minimal bleeding during dressing change.  Lab Results:   Recent Labs  01/20/17 0440 01/21/17 0600  WBC 11.8* 8.9  HGB 8.6* 8.7*  HCT 26.0* 26.3*  PLT 343 385   BMET  Recent Labs  01/19/17 0414  NA 136  K 4.2  CL 103  CO2 28  GLUCOSE 102*  BUN 11  CREATININE 0.74  CALCIUM 8.6*   PT/INR No results for input(s): LABPROT, INR in the last 72 hours. Comprehensive Metabolic Panel:    Component Value Date/Time   NA 136 01/19/2017 0414   NA 139 01/18/2017 0431   NA 139 01/15/2017 1045   NA 139 12/29/2016 1111   K 4.2 01/19/2017 0414   K 3.5 01/18/2017 0431   K 4.2 01/15/2017 1045   K 3.2 (L) 12/29/2016 1111   CL 103 01/19/2017 0414   CL 107 01/18/2017 0431   CO2 28 01/19/2017 0414   CO2 25 01/18/2017 0431   CO2 26 01/15/2017 1045   CO2 24 12/29/2016 1111   BUN 11 01/19/2017 0414   BUN 16 01/18/2017 0431   BUN 12.9 01/15/2017 1045   BUN 13.1 12/29/2016 1111   CREATININE 0.74 01/19/2017 0414   CREATININE 0.64 01/18/2017 0431   CREATININE 0.8 01/15/2017 1045   CREATININE 0.8 12/29/2016 1111   GLUCOSE 102 (H) 01/19/2017 0414   GLUCOSE 148 (H) 01/18/2017 0431   GLUCOSE 91 01/15/2017 1045   GLUCOSE 182 (H) 12/29/2016 1111   CALCIUM 8.6 (L) 01/19/2017 0414   CALCIUM 8.2 (L) 01/18/2017 0431   CALCIUM 9.6 01/15/2017 1045   CALCIUM 9.2 12/29/2016 1111   AST 21 01/15/2017 1045   AST 22 12/29/2016 1902   AST 19 12/29/2016 1111   ALT 13 01/15/2017 1045   ALT 15 12/29/2016 1902   ALT 15 12/29/2016 1111   ALKPHOS 92 01/15/2017 1045   ALKPHOS 75 12/29/2016 1902   ALKPHOS 82 12/29/2016 1111   BILITOT 0.24 01/15/2017 1045   BILITOT 0.5 12/29/2016 1902   BILITOT 0.25 12/29/2016 1111   PROT 6.8 01/15/2017 1045   PROT 6.5 12/29/2016 1902   PROT 6.5 12/29/2016 1111   ALBUMIN 3.0 (L) 01/15/2017  1045   ALBUMIN 3.1 (L) 12/29/2016 1902   ALBUMIN 2.9 (L) 12/29/2016 1111     Studies/Results: No results found.  Anti-infectives: Anti-infectives    None       Charmaine Placido J 01/21/2017

## 2017-01-22 LAB — CBC
HCT: 26.2 % — ABNORMAL LOW (ref 36.0–46.0)
HEMOGLOBIN: 8.5 g/dL — AB (ref 12.0–15.0)
MCH: 26.9 pg (ref 26.0–34.0)
MCHC: 32.4 g/dL (ref 30.0–36.0)
MCV: 82.9 fL (ref 78.0–100.0)
Platelets: 436 10*3/uL — ABNORMAL HIGH (ref 150–400)
RBC: 3.16 MIL/uL — ABNORMAL LOW (ref 3.87–5.11)
RDW: 15.8 % — AB (ref 11.5–15.5)
WBC: 8.5 10*3/uL (ref 4.0–10.5)

## 2017-01-22 MED ORDER — HYDROMORPHONE HCL-NACL 0.5-0.9 MG/ML-% IV SOSY
1.0000 mg | PREFILLED_SYRINGE | INTRAVENOUS | Status: DC | PRN
  Administered 2017-01-22 – 2017-01-24 (×13): 1 mg via INTRAVENOUS
  Filled 2017-01-22 (×13): qty 2

## 2017-01-22 NOTE — Progress Notes (Signed)
PROGRESS NOTE    Ann Oliver  ZOX:[id number] DOB: 07-14-1955 DOA: 01/18/2017 PCP: [facility], [facility]     Brief Narrative:  Ann Oliver is a 61 y.o. female with medical history significant of left-sided fungating breast cancer, currently receiving palliative chemotherapy, who presents to the hospital due to ongoing bleeding of the left breast mass since Saturday. She states that it had stopped on its own, but restarted to bleed day before admission. In the ED, 3 sutures placed and compressive dressing placed, bleeding was controlled. 2 units of packed red blood cells were ordered. During dressing change, patient had significant bleeding from site despite direct pressure for 30 minutes. Gen. surgery was consulted.  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, currently receiving palliative Taxol every 3 weeks -Receives wound care at home and also presents to the ED frequently for dressing changes  -3 sutures and compressive dressing placed in ED, Surgicel applied followed by compressive dressing and breast binder by CCS 8/28  -Appreciate CCS and wound RN   Chronic diarrhea  -Continue home imodium   DVT prophylaxis: SCD Code Status: Full Family Communication: No family at bedside  Disposition Plan: Pending improvement    Consultants:   CCS  Procedures:   None   Antimicrobials:  Anti-infectives    None       Subjective: Continues to have pain in the breast. Some nausea without  vomiting. Per RN, patient has been picking at her wound   Objective: Vitals:   01/21/17 0637 01/21/17 1310 01/21/17 2208 01/22/17 0505  BP: 123/64 118/65 120/74 124/75  Pulse: 92 86 (!) 109 92  Resp: 16 16 16 16   Temp: 98.2 F (36.8 C) 98.7 F (37.1 C) 98.4 F (36.9 C) 98.7 F (37.1 C)  TempSrc: Oral Oral Oral Oral  SpO2: 100% 99% 100% 99%  Weight:      Height:        Intake/Output Summary (Last 24 hours) at 01/22/17 1213 Last data filed at 01/21/17 1900  Gross per 24 hour  Intake              360 ml  Output                0 ml  Net              360 ml   Filed Weights   01/18/17 0951  Weight: 50 kg (110 lb 3.7 oz)    Examination:  General exam: Appears calm and comfortable  Respiratory system: Clear to auscultation. Respiratory effort normal. Cardiovascular system: S1 & S2 heard, RRR. No JVD, murmurs, rubs, gallops or clicks. No pedal edema. Gastrointestinal system: Abdomen is nondistended, soft and nontender. No organomegaly or masses felt. Normal bowel sounds heard. Central nervous system: Alert and oriented. No focal neurological deficits. Extremities: Symmetric 5 x 5 power. Skin: +large mass left breast and axilla. Dressing is wet with dark sanguinous fluid, breast binder in place  Psychiatry: Judgement and insight appear normal. Mood & affect appropriate.  Data Reviewed: I have personally reviewed following labs and imaging studies  CBC:  Recent Labs Lab 01/19/17 0414 01/19/17 1450 01/20/17 0440 01/21/17 0600 01/22/17 0500  WBC 11.4* 13.7* 11.8* 8.9 8.5  HGB 8.8* 9.3* 8.6* 8.7* 8.5*  HCT 26.3* 28.4* 26.0* 26.3* 26.2*  MCV 81.2 81.1 81.8 82.2 82.9  PLT 328 348 343 385 829*   Basic Metabolic Panel:  Recent Labs Lab 01/18/17 0431 01/19/17 0414  NA 139 136  K 3.5 4.2  CL 107 103  CO2 25 28  GLUCOSE 148* 102*  BUN 16 11  CREATININE 0.64 0.74  CALCIUM 8.2* 8.6*   GFR: Estimated Creatinine Clearance: 55 mL/min (by C-G formula based on SCr of  0.74 mg/dL). Liver Function Tests: No results for input(s): AST, ALT, ALKPHOS, BILITOT, PROT, ALBUMIN in the last 168 hours. No results for input(s): LIPASE, AMYLASE in the last 168 hours. No results for input(s): AMMONIA in the last 168 hours. Coagulation Profile:  Recent Labs Lab 01/18/17 0431  INR 0.99   Cardiac Enzymes: No results for input(s): CKTOTAL, CKMB, CKMBINDEX, TROPONINI in the last 168 hours. BNP (last 3 results) No results for input(s): PROBNP in the last 8760 hours. HbA1C: No results for input(s): HGBA1C in the last 72 hours. CBG: No results for input(s): GLUCAP in the last 168 hours. Lipid Profile: No results for input(s): CHOL, HDL, LDLCALC, TRIG, CHOLHDL, LDLDIRECT in the last 72 hours. Thyroid Function Tests: No results for input(s): TSH, T4TOTAL, FREET4, T3FREE, THYROIDAB in the last 72 hours. Anemia Panel: No results for input(s): VITAMINB12, FOLATE, FERRITIN, TIBC, IRON, RETICCTPCT in the last 72 hours. Sepsis Labs: No results for input(s): PROCALCITON, LATICACIDVEN in the last 168 hours.  No results found for this or any previous visit (from the past 240 hour(s)).     Radiology Studies: No results found.    Scheduled Meds: . feeding supplement (ENSURE ENLIVE)  237 mL Oral TID BM  . silver nitrate applicators  1 Stick Topical Once   Continuous Infusions:   LOS: 3 days    Time spent: 30 minutes   Dessa Phi, DO [facility] [email or web address] Password [email or web address] 01/22/2017, 12:13 PM

## 2017-01-22 NOTE — Care Management Note (Signed)
Case Management Note  Patient Details  Name: Ann Oliver MRN: [id number] Date of Birth: 02-Aug-1955  Subjective/Objective:   61 yo admitted with Blood loss anemia. Hx of Large fungating left breast mass                 Action/Plan: Pt is active with [facility] for RN and SW. [facility] has been assisting with dressing changes and pt has also been going to the [place]. Pt would like to continue home care with [facility]. [facility] rep alerted that pt would like to continue services at discharge. Will need resumption orders at DC.  Expected Discharge Date:   (UNKNOWN)               Expected Discharge Plan:  [place]  In-House Referral:     Discharge planning Services  CM Consult  Post Acute Care Choice:  Resumption of Svcs/PTA Provider Choice offered to:  Patient  DME Arranged:    DME Agency:     HH Arranged:  RN, Social Work [employer] Agency:  [place]  Status of Service:  In process, will continue to follow  If discussed at Long Length of Stay Meetings, dates discussed:    Additional CommentsLynnell Catalan, RN 01/22/2017, 1:38 PM [phone]

## 2017-01-22 NOTE — Progress Notes (Signed)
Assessment Metastatic breast cancer with fungating left breast mass - diagnosed 09/2015 on mammogram; biopsy 10/22/2015 proved breast massto be invasive adenocarcinoma grade 2-3 with a Ki-67 30% that was ER positive PR negative - patient neglected her diagnosis, and now her cancer has progressed to stage IV with pulmonary and liver mets - started palliative treatment with Taxol every 3 weeks 11/18/2016 to try and shrink the mass, followed by [name] - getting wound care at home, but having to come to ED frequently due to bleeding ---wound with no bleeding today  ABL anemia -hemoglobin 8.5 today, 8.7 yesterday  Plan:  Continue daily dry dressing change.  Leave Surgicel on wound.   LOS: 3 days        Chief Complaint/Subjective: A little nausea this AM.  Objective: Vital signs in last 24 hours: Temp:  [98.4 F (36.9 C)-98.7 F (37.1 C)] 98.7 F (37.1 C) (08/31 0505) Pulse Rate:  [86-109] 92 (08/31 0505) Resp:  [16] 16 (08/31 0505) BP: (118-124)/(65-75) 124/75 (08/31 0505) SpO2:  [99 %-100 %] 99 % (08/31 0505) Last BM Date: 01/19/17  Intake/Output from previous day: 08/30 0701 - 08/31 0700 In: 480 [P.O.:480] Out: -  Intake/Output this shift: No intake/output data recorded.  PE: General- In NAD.  Awake and alert. Left breast with fungating mass and no bleeding during dressing change.  Lab Results:   Recent Labs  01/21/17 0600 01/22/17 0500  WBC 8.9 8.5  HGB 8.7* 8.5*  HCT 26.3* 26.2*  PLT 385 436*   BMET No results for input(s): NA, K, CL, CO2, GLUCOSE, BUN, CREATININE, CALCIUM in the last 72 hours. PT/INR No results for input(s): LABPROT, INR in the last 72 hours. Comprehensive Metabolic Panel:    Component Value Date/Time   NA 136 01/19/2017 0414   NA 139 01/18/2017 0431   NA 139 01/15/2017 1045   NA 139 12/29/2016 1111   K 4.2 01/19/2017 0414   K 3.5 01/18/2017 0431   K 4.2 01/15/2017 1045   K 3.2 (L) 12/29/2016 1111   CL 103 01/19/2017 0414   CL  107 01/18/2017 0431   CO2 28 01/19/2017 0414   CO2 25 01/18/2017 0431   CO2 26 01/15/2017 1045   CO2 24 12/29/2016 1111   BUN 11 01/19/2017 0414   BUN 16 01/18/2017 0431   BUN 12.9 01/15/2017 1045   BUN 13.1 12/29/2016 1111   CREATININE 0.74 01/19/2017 0414   CREATININE 0.64 01/18/2017 0431   CREATININE 0.8 01/15/2017 1045   CREATININE 0.8 12/29/2016 1111   GLUCOSE 102 (H) 01/19/2017 0414   GLUCOSE 148 (H) 01/18/2017 0431   GLUCOSE 91 01/15/2017 1045   GLUCOSE 182 (H) 12/29/2016 1111   CALCIUM 8.6 (L) 01/19/2017 0414   CALCIUM 8.2 (L) 01/18/2017 0431   CALCIUM 9.6 01/15/2017 1045   CALCIUM 9.2 12/29/2016 1111   AST 21 01/15/2017 1045   AST 22 12/29/2016 1902   AST 19 12/29/2016 1111   ALT 13 01/15/2017 1045   ALT 15 12/29/2016 1902   ALT 15 12/29/2016 1111   ALKPHOS 92 01/15/2017 1045   ALKPHOS 75 12/29/2016 1902   ALKPHOS 82 12/29/2016 1111   BILITOT 0.24 01/15/2017 1045   BILITOT 0.5 12/29/2016 1902   BILITOT 0.25 12/29/2016 1111   PROT 6.8 01/15/2017 1045   PROT 6.5 12/29/2016 1902   PROT 6.5 12/29/2016 1111   ALBUMIN 3.0 (L) 01/15/2017 1045   ALBUMIN 3.1 (L) 12/29/2016 1902   ALBUMIN 2.9 (L) 12/29/2016 1111  Studies/Results: No results found.  Anti-infectives: Anti-infectives    None       Zyion Leidner J 01/22/2017

## 2017-01-23 LAB — CBC
HCT: 25.7 % — ABNORMAL LOW (ref 36.0–46.0)
Hemoglobin: 8.2 g/dL — ABNORMAL LOW (ref 12.0–15.0)
MCH: 26.5 pg (ref 26.0–34.0)
MCHC: 31.9 g/dL (ref 30.0–36.0)
MCV: 82.9 fL (ref 78.0–100.0)
Platelets: 456 10*3/uL — ABNORMAL HIGH (ref 150–400)
RBC: 3.1 MIL/uL — AB (ref 3.87–5.11)
RDW: 16 % — AB (ref 11.5–15.5)
WBC: 9.3 10*3/uL (ref 4.0–10.5)

## 2017-01-23 NOTE — Progress Notes (Signed)
PROGRESS NOTE    Ann Oliver  HBZ:[id number] DOB: Oct 02, 1955 DOA: 01/18/2017 PCP: [facility]     Brief Narrative:  Ann Oliver is a 61 y.o. female with medical history significant of left-sided fungating breast cancer, currently receiving palliative chemotherapy, who presents to the hospital due to ongoing bleeding of the left breast mass since Saturday. She states that it had stopped on its own, but restarted to bleed day before admission. In the ED, 3 sutures placed and compressive dressing placed, bleeding was controlled. 2 units of packed red blood cells were ordered. During dressing change, patient had significant bleeding from site despite direct pressure for 30 minutes. Gen. surgery was consulted.  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, currently receiving palliative Taxol every 3 weeks -Receives wound care at home and also presents to the ED frequently for dressing changes  -3 sutures and compressive dressing placed in ED, Surgicel applied followed by compressive dressing and breast binder by [facility] 8/28, [facility] continues to follow for dressing changes   Chronic diarrhea  -Continue home imodium   DVT prophylaxis: SCD Code Status: Full Family Communication: No family at bedside  Disposition Plan: Pending improvement. Discharge when cleared by [facility]    Consultants:   CCS  Procedures:   None   Antimicrobials:  Anti-infectives    None       Subjective: Continues to  have pain in the breast. Some nausea without vomiting. No acute events.   Objective: Vitals:   01/22/17 0505 01/22/17 1430 01/22/17 1952 01/23/17 0539  BP: 124/75 110/62 120/62 121/63  Pulse: 92 90 92 98  Resp: 16 18 16 16   Temp: 98.7 F (37.1 C) 98.3 F (36.8 C) 98 F (36.7 C) 97.9 F (36.6 C)  TempSrc: Oral Oral Oral Oral  SpO2: 99% 100% 100% 99%  Weight:      Height:        Intake/Output Summary (Last 24 hours) at 01/23/17 1201 Last data filed at 01/23/17 0539  Gross per 24 hour  Intake              960 ml  Output                0 ml  Net              960 ml   Filed Weights   01/18/17 0951  Weight: 50 kg (110 lb 3.7 oz)    Examination:  General exam: Appears calm and comfortable  Respiratory system: Clear to auscultation. Respiratory effort normal. Cardiovascular system: S1 & S2 heard, RRR. No JVD, murmurs, rubs, gallops or clicks. No pedal edema. Gastrointestinal system: Abdomen is nondistended, soft and nontender. No organomegaly or masses felt. Normal bowel sounds heard. Central nervous system: Alert and oriented. No focal neurological deficits. Extremities: Symmetric 5 x 5 power. Skin: +large mass left breast and axilla. Dressing is wet with brown discharge, breast binder in place  Psychiatry: Judgement and insight appear normal. Mood & affect appropriate.   Data Reviewed:  I have personally reviewed following labs and imaging studies  CBC:  Recent Labs Lab 01/19/17 1450 01/20/17 0440 01/21/17 0600 01/22/17 0500 01/23/17 0500  WBC 13.7* 11.8* 8.9 8.5 9.3  HGB 9.3* 8.6* 8.7* 8.5* 8.2*  HCT 28.4* 26.0* 26.3* 26.2* 25.7*  MCV 81.1 81.8 82.2 82.9 82.9  PLT 348 343 385 436* 706*   Basic Metabolic Panel:  Recent Labs Lab 01/18/17 0431 01/19/17 0414  NA 139 136  K 3.5 4.2  CL 107 103  CO2 25 28  GLUCOSE 148* 102*  BUN 16 11  CREATININE 0.64 0.74  CALCIUM 8.2* 8.6*   GFR: Estimated Creatinine Clearance: 55 mL/min (by C-G formula based on SCr of  0.74 mg/dL). Liver Function Tests: No results for input(s): AST, ALT, ALKPHOS, BILITOT, PROT, ALBUMIN in the last 168 hours. No results for input(s): LIPASE, AMYLASE in the last 168 hours. No results for input(s): AMMONIA in the last 168 hours. Coagulation Profile:  Recent Labs Lab 01/18/17 0431  INR 0.99   Cardiac Enzymes: No results for input(s): CKTOTAL, CKMB, CKMBINDEX, TROPONINI in the last 168 hours. BNP (last 3 results) No results for input(s): PROBNP in the last 8760 hours. HbA1C: No results for input(s): HGBA1C in the last 72 hours. CBG: No results for input(s): GLUCAP in the last 168 hours. Lipid Profile: No results for input(s): CHOL, HDL, LDLCALC, TRIG, CHOLHDL, LDLDIRECT in the last 72 hours. Thyroid Function Tests: No results for input(s): TSH, T4TOTAL, FREET4, T3FREE, THYROIDAB in the last 72 hours. Anemia Panel: No results for input(s): VITAMINB12, FOLATE, FERRITIN, TIBC, IRON, RETICCTPCT in the last 72 hours. Sepsis Labs: No results for input(s): PROCALCITON, LATICACIDVEN in the last 168 hours.  No results found for this or any previous visit (from the past 240 hour(s)).     Radiology Studies: No results found.    Scheduled Meds: . feeding supplement (ENSURE ENLIVE)  237 mL Oral TID BM  . silver nitrate applicators  1 Stick Topical Once   Continuous Infusions:   LOS: 4 days    Time spent: 20 minutes   Dessa Phi, DO [facility] [email or web address] Password [email or web address] 01/23/2017, 12:01 PM

## 2017-01-24 ENCOUNTER — Encounter (HOSPITAL_COMMUNITY): Payer: Self-pay | Admitting: Emergency Medicine

## 2017-01-24 ENCOUNTER — Emergency Department (HOSPITAL_COMMUNITY)
Admission: EM | Admit: 2017-01-24 | Discharge: 2017-01-25 | Disposition: A | Discharge status: Home / Self Care | Payer: Medicaid Other | Attending: Emergency Medicine | Admitting: Emergency Medicine

## 2017-01-24 DIAGNOSIS — N644 Mastodynia: Secondary | ICD-10-CM | POA: Insufficient documentation

## 2017-01-24 DIAGNOSIS — C50512 Malignant neoplasm of lower-outer quadrant of left female breast: Secondary | ICD-10-CM

## 2017-01-24 DIAGNOSIS — Z79899 Other long term (current) drug therapy: Secondary | ICD-10-CM | POA: Insufficient documentation

## 2017-01-24 LAB — DIFFERENTIAL
BASOS ABS: 0 10*3/uL (ref 0.0–0.1)
Basophils Relative: 0 %
EOS ABS: 0.2 10*3/uL (ref 0.0–0.7)
Eosinophils Relative: 3 %
LYMPHS ABS: 0.8 10*3/uL (ref 0.7–4.0)
Lymphocytes Relative: 8 %
MONOS PCT: 9 %
Monocytes Absolute: 0.9 10*3/uL (ref 0.1–1.0)
Neutro Abs: 7.7 10*3/uL (ref 1.7–7.7)
Neutrophils Relative %: 80 %

## 2017-01-24 LAB — CBC
HEMATOCRIT: 26.3 % — AB (ref 36.0–46.0)
HEMOGLOBIN: 8.6 g/dL — AB (ref 12.0–15.0)
MCH: 27 pg (ref 26.0–34.0)
MCHC: 32.7 g/dL (ref 30.0–36.0)
MCV: 82.4 fL (ref 78.0–100.0)
Platelets: 520 10*3/uL — ABNORMAL HIGH (ref 150–400)
RBC: 3.19 MIL/uL — ABNORMAL LOW (ref 3.87–5.11)
RDW: 15.9 % — ABNORMAL HIGH (ref 11.5–15.5)
WBC: 9.5 10*3/uL (ref 4.0–10.5)

## 2017-01-24 MED ORDER — HEPARIN SOD (PORK) LOCK FLUSH 100 UNIT/ML IV SOLN
250.0000 [IU] | INTRAVENOUS | Status: AC | PRN
  Administered 2017-01-24: 250 [IU]

## 2017-01-24 MED ORDER — HYDROMORPHONE HCL 1 MG/ML IJ SOLN
1.0000 mg | Freq: Once | INTRAMUSCULAR | Status: AC
  Administered 2017-01-25: 1 mg via INTRAVENOUS
  Filled 2017-01-24: qty 1

## 2017-01-24 MED ORDER — ONDANSETRON HCL 8 MG PO TABS: 8.0000 mg | ORAL_TABLET | Freq: Three times a day (TID) | ORAL | 0 refills | Status: DC | PRN

## 2017-01-24 MED ORDER — ACETAMINOPHEN 325 MG PO TABS
650.0000 mg | ORAL_TABLET | Freq: Once | ORAL | Status: DC
  Filled 2017-01-24: qty 2

## 2017-01-24 NOTE — Care Management Note (Signed)
Case Management Note  Patient Details  Name: Ann Oliver MRN: [id number] Date of Birth: 1956/05/13  Subjective/Objective:   Blood loss anemia, large fungating left breast mass with bleeding, chronic diarrhea               Action/Plan: Discharge Planning: Please see previous NCM notes. Contacted [facility] to make aware of dc home today. Pt will need transportation home. CSW referral for transportation.    Expected Discharge Date:  01/24/17               Expected Discharge Plan:  [place]  In-House Referral:  Clinical Social Work  Discharge planning Services  CM Consult  Post Acute Care Choice:  Resumption of Svcs/PTA Provider, Home Health Choice offered to:  Patient  DME Arranged:  N/A DME Agency:  NA  HH Arranged:  RN, Social Work [employer] Agency:  [place]  Status of Service:  Completed, signed off  If discussed at [employer], dates discussed:    Additional Comments:  Erenest Rasher, RN 01/24/2017, 11:21 AM

## 2017-01-24 NOTE — ED Triage Notes (Signed)
Brought in by EMS from home with c/o uncontrolled pain to left breast.  Pt was discharged home from hospitalization for her malignant left breast cancer with wound earlier today.  Pt reports that her pain has not been relieved with her pain medications since her discharge.

## 2017-01-24 NOTE — Discharge Summary (Signed)
Physician Discharge Summary  [name] KVQ:[id number] DOB: 07-20-1955 DOA: 01/18/2017  PCP: [facility]  Admit date: 01/18/2017 Discharge date: 01/24/2017  Admitted From: Home Disposition:  Home  Recommendations for Outpatient Follow-up:  1. Follow up with PCP in 1 week 2. Follow up with Wound care as outpatient next week  3. Please obtain CBC in 1 week   Home Health: RN, SW  Equipment/Devices: None   Discharge Condition: Stable CODE STATUS: Full  Diet recommendation: Heart healthy   Brief/Interim Summary: Ann Oliver a 61 y.o.femalewith medical history significant of left-sided fungating breast cancer, currently receiving palliative chemotherapy,who presents to the hospital due to ongoing bleeding of the left breast mass since Saturday. She states that it had stopped on its own, but restarted to bleed day before admission. In the ED, 3 sutures placed and compressive dressing placed, bleeding was controlled. 2 units of packed red blood cells were ordered. During dressing change, patient had significant bleeding from site despite direct pressure for 30 minutes. Gen. surgery was consulted. Surgicel was applied over the entirety of mass, followed by compressive dressing with gauze and Kerlix. Breast binder was also applied on top of dressing. They have recommended daily dry dressing changes and to leave Surgicel in place. Patient is to follow-up with outpatient wound care center as well as home health RN for complex wound care.  Discharge Diagnoses:  Principal Problem:   Blood loss anemia Active Problems:   Breast cancer of upper-outer quadrant of left female breast (HCC)   Breast cancer metastasized to lung, right (HCC)   Malnutrition of moderate degree  Acute blood loss anemia on chronic anemia of chronic disease -Acute anemia due to uncontrolled bleeding in left breast mass  -Baseline hemoglobin levels around 8.8, received 2u pRBC on 8/27 -Trend  CBC, stable   Large fungating left breast mass with bleeding -Follows with [name], currently receiving palliative Taxol every 3 weeks -Receives wound care at home and also presents to the ED frequently for dressing changes  -3 sutures and compressive dressing placed in ED, Surgicel applied followed by compressive dressing and breast binder by [facility] 8/28 -Spoke with [facility] this morning. Gen. surgery has recommended to continue daily dry dressing changes, and to leave Surgicel on wound until follow up by outpatient wound care and with [name].  Chronic diarrhea -Continue home imodium    Discharge Instructions  Discharge Instructions    Call MD for:  difficulty breathing, headache or visual disturbances    Complete by:  As directed    Call MD for:  extreme fatigue    Complete by:  As directed    Call MD for:  hives    Complete by:  As directed    Call MD for:  persistant dizziness or light-headedness    Complete by:  As directed    Call MD for:  persistant nausea and vomiting    Complete by:  As directed    Call MD for:  severe uncontrolled pain    Complete by:  As directed    Call MD for:  temperature >100.4    Complete by:  As directed    Diet - low sodium heart healthy    Complete by:  As directed    Discharge instructions    Complete by:  As directed    You were cared for by a hospitalist during your hospital stay. If you have any questions about your discharge medications or the care you received while you  were in the hospital after you are discharged, you can call the unit and asked to speak with the hospitalist on call if the hospitalist that took care of you is not available. Once you are discharged, your primary care physician will handle any further medical issues. Please note that NO REFILLS for any discharge medications will be authorized once you are discharged, as it is imperative that you return to your primary care physician (or establish a relationship with a  primary care physician if you do not have one) for your aftercare needs so that they can reassess your need for medications and monitor your lab values.   Discharge wound care:    Complete by:  As directed    Continue daily dry dressing change. Leave Surgicel on wound. You must follow up with outpatient wound care center and to continue home health wound nurse.   Increase activity slowly    Complete by:  As directed      Allergies as of 01/24/2017      Reactions   Aspirin Palpitations      Medication List    STOP taking these medications   ibuprofen 200 MG tablet Commonly known as:  ADVIL,MOTRIN   Metronidazole Benzoate Powd     TAKE these medications   diphenoxylate-atropine 2.5-0.025 MG tablet Commonly known as:  LOMOTIL Take 1 tablet by mouth 4 (four) times daily as needed for diarrhea or loose stools.   feeding supplement (ENSURE ENLIVE) Liqd Take 237 mLs by mouth 3 (three) times daily between meals.   loperamide 2 MG capsule Commonly known as:  IMODIUM Take 1 capsule (2 mg total) by mouth as needed for diarrhea or loose stools. Take as directed on package   ondansetron 8 MG tablet Commonly known as:  ZOFRAN Take 1 tablet (8 mg total) by mouth every 8 (eight) hours as needed for nausea or vomiting.   oxyCODONE-acetaminophen 5-325 MG tablet Commonly known as:  PERCOCET/ROXICET Take 2 tablets by mouth every 6 (six) hours as needed for severe pain. Use for severe pain - Do not take with Tylenol as this tablet already contains tylenol            Discharge Care Instructions        Start     Ordered   01/24/17 0000  ondansetron (ZOFRAN) 8 MG tablet  Every 8 hours PRN     01/24/17 1105   01/24/17 0000  Increase activity slowly     01/24/17 1105   01/24/17 0000  Discharge instructions    Comments:  You were cared for by a hospitalist during your hospital stay. If you have any questions about your discharge medications or the care you received while you were in the  hospital after you are discharged, you can call the unit and asked to speak with the hospitalist on call if the hospitalist that took care of you is not available. Once you are discharged, your primary care physician will handle any further medical issues. Please note that NO REFILLS for any discharge medications will be authorized once you are discharged, as it is imperative that you return to your primary care physician (or establish a relationship with a primary care physician if you do not have one) for your aftercare needs so that they can reassess your need for medications and monitor your lab values.   01/24/17 1105   01/24/17 0000  Diet - low sodium heart healthy     01/24/17 1105   01/24/17 0000  Call MD for:  extreme fatigue     01/24/17 1105   01/24/17 0000  Call MD for:  persistant dizziness or light-headedness     01/24/17 1105   01/24/17 0000  Call MD for:  hives     01/24/17 1105   01/24/17 0000  Call MD for:  difficulty breathing, headache or visual disturbances     01/24/17 1105   01/24/17 0000  Call MD for:  severe uncontrolled pain     01/24/17 1105   01/24/17 0000  Call MD for:  persistant nausea and vomiting     01/24/17 1105   01/24/17 0000  Call MD for:  temperature >100.4     01/24/17 1105   01/24/17 0000  Discharge wound care:    Comments:  Continue daily dry dressing change. Leave Surgicel on wound. You must follow up with outpatient wound care center and to continue home health wound nurse.   01/24/17 [place], [facility]. Schedule an appointment as soon as possible for a visit in 1 week(s).   Contact information: [place] [address] [facility]             . Call on 01/26/2017.   Why:  To make hospital follow up appointment for wound care  Contact information: [address]  [id number] [place], [facility] Follow up.   Why:  Home Health RN and Social Web designer information: [place] [address] [phone]          Allergies  Allergen Reactions  . Aspirin Palpitations    Consultations:  CCS   Procedures/Studies: No results found.     Discharge Exam: Vitals:   01/23/17 2010 01/24/17 0620  BP: 93/71 139/69  Pulse: 98 98  Resp: 16 16  Temp: 98.6 F (37 C) 98.5 F (36.9 C)  SpO2: 100% 100%   Vitals:   01/23/17 0539 01/23/17 1410 01/23/17 2010 01/24/17 0620  BP: 121/63 132/80 93/71 139/69  Pulse: 98 100 98 98  Resp: 16 16 16 16   Temp: 97.9 F (36.6 C) 98.2 F (36.8 C) 98.6 F (37 C) 98.5 F (36.9 C)  TempSrc: Oral Oral Oral Oral  SpO2: 99% 100% 100% 100%  Weight:      Height:        General: Pt is alert, awake, not in acute distress Cardiovascular: RRR, S1/S2 +, no rubs, no gallops Respiratory: CTA bilaterally, no wheezing, no rhonchi Abdominal: Soft, NT, ND, bowel sounds + Extremities: no edema, no cyanosis Skin: +dressing in place on left breast, breast binder in place, malodorous.     The results of significant diagnostics from this hospitalization (including imaging, microbiology, ancillary and laboratory) are listed below for reference.     Microbiology: No results found for this or any previous visit (from the past 240 hour(s)).   Labs: BNP (last 3 results) No results for input(s): BNP in the last 8760 hours. Basic Metabolic Panel:  Recent Labs Lab 01/18/17 0431 01/19/17 0414  NA 139 136  K 3.5 4.2  CL 107 103  CO2 25 28  GLUCOSE 148* 102*  BUN 16 11  CREATININE 0.64 0.74  CALCIUM 8.2* 8.6*   Liver Function Tests: No results for input(s): AST, ALT, ALKPHOS, BILITOT, PROT,  ALBUMIN in the last 168 hours. No results for input(s): LIPASE, AMYLASE in the last 168 hours. No results for input(s): AMMONIA in the last 168 hours. CBC:  Recent  Labs Lab 01/20/17 0440 01/21/17 0600 01/22/17 0500 01/23/17 0500 01/24/17 0909  WBC 11.8* 8.9 8.5 9.3 9.5  NEUTROABS  --   --   --   --  7.7  HGB 8.6* 8.7* 8.5* 8.2* 8.6*  HCT 26.0* 26.3* 26.2* 25.7* 26.3*  MCV 81.8 82.2 82.9 82.9 82.4  PLT 343 385 436* 456* 520*   Cardiac Enzymes: No results for input(s): CKTOTAL, CKMB, CKMBINDEX, TROPONINI in the last 168 hours. BNP: Invalid input(s): POCBNP CBG: No results for input(s): GLUCAP in the last 168 hours. D-Dimer No results for input(s): DDIMER in the last 72 hours. Hgb A1c No results for input(s): HGBA1C in the last 72 hours. Lipid Profile No results for input(s): CHOL, HDL, LDLCALC, TRIG, CHOLHDL, LDLDIRECT in the last 72 hours. Thyroid function studies No results for input(s): TSH, T4TOTAL, T3FREE, THYROIDAB in the last 72 hours.  Invalid input(s): FREET3 Anemia work up No results for input(s): VITAMINB12, FOLATE, FERRITIN, TIBC, IRON, RETICCTPCT in the last 72 hours. Urinalysis    Component Value Date/Time   COLORURINE YELLOW 09/25/2016 0230   APPEARANCEUR CLEAR 09/25/2016 0230   LABSPEC 1.019 09/25/2016 0230   PHURINE 5.0 09/25/2016 0230   GLUCOSEU NEGATIVE 09/25/2016 0230   HGBUR NEGATIVE 09/25/2016 0230   BILIRUBINUR NEGATIVE 09/25/2016 0230   KETONESUR 5 (A) 09/25/2016 0230   PROTEINUR NEGATIVE 09/25/2016 0230   NITRITE NEGATIVE 09/25/2016 0230   LEUKOCYTESUR NEGATIVE 09/25/2016 0230   Sepsis Labs Invalid input(s): PROCALCITONIN,  WBC,  LACTICIDVEN Microbiology No results found for this or any previous visit (from the past 240 hour(s)).   Time coordinating discharge: 40 minutes  SIGNED:  Dessa Phi, DO [facility] Pager [phone]  If 7PM-7AM, please contact night-coverage [email or web address] Password [email or web address] 01/24/2017, 11:31 AM

## 2017-01-24 NOTE — ED Provider Notes (Signed)
[facility] DEPT Provider Note   CSN: [id number] Arrival date & time: 01/24/17  2235     History   Chief Complaint Chief Complaint  Patient presents with  . Breast Pain    HPI Ann Oliver is a 61 y.o. female.  HPI  This is a 61 year old female with a history of breast cancer and ulcerating breast mass who presents with breast pain. Patient was discharged from hospital earlier today after being admitted for anemia and ongoing bleeding from the breast mass. Her pain medications were changed she was told to stop taking ibuprofen with concerns that that may be worsening her bleeding. She states that she has taken oxycodone with no relief of pain. Current pain is 9 out of 10. She has also noted drainage on the bandage. She is to follow-up wound care. Denies fevers or any other symptoms.  Past Medical History:  Diagnosis Date  . Cancer ([place])   . Sinusitis   . Tumor cells     Patient Active Problem List   Diagnosis Date Noted  . Blood loss anemia 01/18/2017  . Port catheter in place 12/02/2016  . Necrotizing soft tissue infection 11/21/2016  . Cellulitis 10/28/2016  . Wound infection 10/28/2016  . Cellulitis of left breast 10/28/2016  . Encounter for palliative care   . Goals of care, counseling/discussion   . Breast cancer metastasized to lung, right ([place]) 09/25/2016  . Malnutrition of moderate degree 09/25/2016  . Breast cancer of upper-outer quadrant of left female breast ([place]) 10/22/2015  . THYROID NODULE, RIGHT 05/31/2007  . DEPRESSION 05/31/2007  . GERD 05/31/2007  . HOT FLASHES 05/31/2007  . HEADACHE 05/31/2007    Past Surgical History:  Procedure Laterality Date  . BIOPSY BREAST    . IR FLUORO GUIDE CV LINE RIGHT  11/06/2016  . IR US GUIDE VASC ACCESS RIGHT  11/06/2016    OB History    No data available       Home Medications    Prior to Admission medications   Medication Sig Start Date End Date Taking? Authorizing Provider  feeding supplement,  ENSURE ENLIVE, (ENSURE ENLIVE) LIQD Take 237 mLs by mouth 3 (three) times daily between meals. 09/26/16  Yes [name], MD  oxyCODONE-acetaminophen (PERCOCET/ROXICET) 5-325 MG tablet Take 2 tablets by mouth every 6 (six) hours as needed for severe pain. Use for severe pain - Do not take with Tylenol as this tablet already contains tylenol 01/15/17  Yes Nicholas Lose, MD  diphenoxylate-atropine (LOMOTIL) 2.5-0.025 MG tablet Take 1 tablet by mouth 4 (four) times daily as needed for diarrhea or loose stools. Patient not taking: Reported on 01/25/2017 01/11/17   Gardenia Phlegm, NP  loperamide (IMODIUM) 2 MG capsule Take 1 capsule (2 mg total) by mouth as needed for diarrhea or loose stools. Take as directed on package Patient not taking: Reported on 01/25/2017 01/06/17   Magrinat, Virgie Dad, MD  ondansetron (ZOFRAN) 8 MG tablet Take 1 tablet (8 mg total) by mouth every 8 (eight) hours as needed for nausea or vomiting. 01/24/17   Shon Millet, DO    Family History Family History  Problem Relation Age of Onset  . Breast cancer Neg Hx     Social History Social History  Substance Use Topics  . Smoking status: Never Smoker  . Smokeless tobacco: Never Used  . Alcohol use No     Allergies   Aspirin   Review of Systems Review of Systems  Constitutional: Negative for fever.  Respiratory: Negative for shortness of breath.   Cardiovascular: Negative for chest pain.  Gastrointestinal: Negative for abdominal pain.  Genitourinary: Negative for dysuria.  Skin:       Breast pain and mass  All other systems reviewed and are negative.    Physical Exam Updated Vital Signs BP 130/75 (BP Location: Left Arm)   Pulse (!) 106   Temp 98.4 F (36.9 C) (Oral)   Resp 20   Ht 5\' 1"  (1.549 m)   Wt 49.9 kg (110 lb)   SpO2 98%   BMI 20.78 kg/m   Physical Exam  Constitutional: She is oriented to person, place, and time.  Thin, chronically ill-appearing  HENT:  Head:  Normocephalic and atraumatic.  Cardiovascular: Normal rate, regular rhythm and normal heart sounds.   No murmur heard. Pulmonary/Chest: Effort normal and breath sounds normal. No respiratory distress. She has no wheezes. Left breast exhibits mass, skin change and tenderness.    Large ulcerating breast mass over the left lateral chest just lateral to the left breast, discharge and bleeding noted, foul odor  Abdominal: Soft. Bowel sounds are normal.  Genitourinary: There is breast discharge and bleeding.  Neurological: She is alert and oriented to person, place, and time.  Skin: Skin is warm and dry.  Psychiatric: She has a normal mood and affect.  Nursing note and vitals reviewed.    ED Treatments / Results  Labs (all labs ordered are listed, but only abnormal results are displayed) Labs Reviewed - No data to display  EKG  EKG Interpretation None       Radiology No results found.  Procedures Procedures (including critical care time)  Medications Ordered in ED Medications  acetaminophen (TYLENOL) tablet 650 mg (650 mg Oral Not Given 01/25/17 0004)  HYDROmorphone (DILAUDID) injection 1 mg (1 mg Intravenous Given 01/25/17 0002)     Initial Impression / Assessment and Plan / ED Course  I have reviewed the triage vital signs and the nursing notes.  Pertinent labs & imaging results that were available during my care of the patient were reviewed by me and considered in my medical decision making (see chart for details).     Patient presents with large ulcerating breast mass. Reports increasing pain. She discontinued ibuprofen secondary to recent increased bleeding of the mass. She takes oxycodone every 6 hours home. She is nontoxic appearing. She has mass as described above. Dressing was changed. Patient was given 1 dose of IV pain medication. Patient instructed to increase pain medication every 4 hours if needed. Follow-up with her primary care team.  After history, exam, and  medical workup I feel the patient has been appropriately medically screened and is safe for discharge home. Pertinent diagnoses were discussed with the patient. Patient was given return precautions.   Final Clinical Impressions(s) / ED Diagnoses   Final diagnoses:  Breast pain  Malignant neoplasm of lower-outer quadrant of left female breast, unspecified estrogen receptor status ([place])    New Prescriptions New Prescriptions   No medications on file     Merryl Hacker, MD 01/25/17 [phone]

## 2017-01-24 NOTE — Progress Notes (Signed)
Patient is refusing labs this am. Patient reports that she wants to wait for doctor to arrive before labs are drawn.Roderick Pee

## 2017-01-24 NOTE — Progress Notes (Signed)
Patient discharged to home, all discharge medications and instructions reviewed and questions answered.  Patient to go home via cab, to be assisted to vehicle by wheelchair.

## 2017-01-25 NOTE — Discharge Instructions (Signed)
You were seen today for breast pain. Take your outpatient medication as prescribed. Follow-up with your primary physician.

## 2017-01-26 ENCOUNTER — Encounter: Payer: Self-pay | Admitting: Emergency Medicine

## 2017-01-26 ENCOUNTER — Ambulatory Visit (HOSPITAL_BASED_OUTPATIENT_CLINIC_OR_DEPARTMENT_OTHER): Payer: Medicaid Other

## 2017-01-26 ENCOUNTER — Other Ambulatory Visit: Payer: Self-pay

## 2017-01-26 DIAGNOSIS — C50412 Malignant neoplasm of upper-outer quadrant of left female breast: Secondary | ICD-10-CM | POA: Diagnosis not present

## 2017-01-26 DIAGNOSIS — C7800 Secondary malignant neoplasm of unspecified lung: Secondary | ICD-10-CM | POA: Diagnosis not present

## 2017-01-26 DIAGNOSIS — C50911 Malignant neoplasm of unspecified site of right female breast: Secondary | ICD-10-CM

## 2017-01-26 DIAGNOSIS — Z95828 Presence of other vascular implants and grafts: Secondary | ICD-10-CM

## 2017-01-26 DIAGNOSIS — C773 Secondary and unspecified malignant neoplasm of axilla and upper limb lymph nodes: Secondary | ICD-10-CM | POA: Diagnosis not present

## 2017-01-26 DIAGNOSIS — Z452 Encounter for adjustment and management of vascular access device: Secondary | ICD-10-CM

## 2017-01-26 MED ORDER — ONDANSETRON HCL 8 MG PO TABS
8.0000 mg | ORAL_TABLET | Freq: Three times a day (TID) | ORAL | 0 refills | Status: DC | PRN
Start: 2017-01-26 — End: 2017-02-04

## 2017-01-26 MED ORDER — HEPARIN SOD (PORK) LOCK FLUSH 100 UNIT/ML IV SOLN
250.0000 [IU] | Freq: Once | INTRAVENOUS | Status: AC
  Administered 2017-01-26: 500 [IU]
  Filled 2017-01-26: qty 5

## 2017-01-26 MED ORDER — SODIUM CHLORIDE 0.9% FLUSH
10.0000 mL | Freq: Once | INTRAVENOUS | Status: AC
  Administered 2017-01-26: 10 mL
  Filled 2017-01-26: qty 10

## 2017-01-26 NOTE — Progress Notes (Signed)
Wound care completed to patients left breast wound. Patient is to be seen in the wound center tomorrow 01/27/17.

## 2017-01-27 ENCOUNTER — Encounter (HOSPITAL_BASED_OUTPATIENT_CLINIC_OR_DEPARTMENT_OTHER): Payer: Medicaid Other | Attending: Surgery

## 2017-01-27 DIAGNOSIS — Z9221 Personal history of antineoplastic chemotherapy: Secondary | ICD-10-CM | POA: Diagnosis not present

## 2017-01-27 DIAGNOSIS — L03313 Cellulitis of chest wall: Secondary | ICD-10-CM | POA: Insufficient documentation

## 2017-01-27 DIAGNOSIS — D0502 Lobular carcinoma in situ of left breast: Secondary | ICD-10-CM | POA: Insufficient documentation

## 2017-01-27 MED FILL — ONDANSETRON HCL 8 MG TAB: 8 | 7 days supply | Qty: 20 | Fill #0

## 2017-01-29 ENCOUNTER — Other Ambulatory Visit: Payer: Self-pay

## 2017-01-29 ENCOUNTER — Other Ambulatory Visit (HOSPITAL_BASED_OUTPATIENT_CLINIC_OR_DEPARTMENT_OTHER): Payer: Medicaid Other

## 2017-01-29 ENCOUNTER — Ambulatory Visit (HOSPITAL_BASED_OUTPATIENT_CLINIC_OR_DEPARTMENT_OTHER): Payer: Medicaid Other

## 2017-01-29 DIAGNOSIS — C50412 Malignant neoplasm of upper-outer quadrant of left female breast: Secondary | ICD-10-CM

## 2017-01-29 DIAGNOSIS — C50911 Malignant neoplasm of unspecified site of right female breast: Secondary | ICD-10-CM

## 2017-01-29 DIAGNOSIS — C7800 Secondary malignant neoplasm of unspecified lung: Secondary | ICD-10-CM

## 2017-01-29 DIAGNOSIS — Z95828 Presence of other vascular implants and grafts: Secondary | ICD-10-CM

## 2017-01-29 DIAGNOSIS — Z170 Estrogen receptor positive status [ER+]: Principal | ICD-10-CM

## 2017-01-29 LAB — COMPREHENSIVE METABOLIC PANEL
ALK PHOS: 191 U/L — AB (ref 40–150)
ALT: 32 U/L (ref 0–55)
AST: 30 U/L (ref 5–34)
Albumin: 2.7 g/dL — ABNORMAL LOW (ref 3.5–5.0)
Anion Gap: 12 mEq/L — ABNORMAL HIGH (ref 3–11)
BUN: 20.2 mg/dL (ref 7.0–26.0)
CHLORIDE: 98 meq/L (ref 98–109)
CO2: 25 mEq/L (ref 22–29)
Calcium: 9.7 mg/dL (ref 8.4–10.4)
Creatinine: 0.8 mg/dL (ref 0.6–1.1)
EGFR: >90 mL/min/{1.73_m2} (ref >90)
GLUCOSE: 135 mg/dL (ref 70–140)
POTASSIUM: 3.9 meq/L (ref 3.5–5.1)
SODIUM: 134 meq/L — AB (ref 136–145)
Total Bilirubin: <0.22 mg/dL (ref 0.20–1.20)
Total Protein: 7 g/dL (ref 6.4–8.3)

## 2017-01-29 LAB — CBC WITH DIFFERENTIAL/PLATELET
BASO%: 0.1 % (ref 0.0–2.0)
BASOS ABS: 0 10*3/uL (ref 0.0–0.1)
EOS ABS: 0.3 10*3/uL (ref 0.0–0.5)
EOS%: 2.5 % (ref 0.0–7.0)
HCT: 28.1 % — ABNORMAL LOW (ref 34.8–46.6)
HEMOGLOBIN: 8.8 g/dL — AB (ref 11.6–15.9)
LYMPH%: 8.5 % — AB (ref 14.0–49.7)
MCH: 25.9 pg (ref 25.1–34.0)
MCHC: 31.3 g/dL — AB (ref 31.5–36.0)
MCV: 82.6 fL (ref 79.5–101.0)
MONO#: 1 10*3/uL — ABNORMAL HIGH (ref 0.1–0.9)
MONO%: 7.5 % (ref 0.0–14.0)
NEUT#: 10.3 10*3/uL — ABNORMAL HIGH (ref 1.5–6.5)
NEUT%: 81.4 % — AB (ref 38.4–76.8)
Platelets: 599 10*3/uL — ABNORMAL HIGH (ref 145–400)
RBC: 3.4 10*6/uL — AB (ref 3.70–5.45)
RDW: 16.2 % — AB (ref 11.2–14.5)
WBC: 12.6 10*3/uL — ABNORMAL HIGH (ref 3.9–10.3)
lymph#: 1.1 10*3/uL (ref 0.9–3.3)

## 2017-01-29 MED ORDER — OXYCODONE-ACETAMINOPHEN 5-325 MG PO TABS: 2.0000 | ORAL_TABLET | Freq: Four times a day (QID) | ORAL | 0 refills | Status: DC | PRN

## 2017-01-29 MED ORDER — ALTEPLASE 2 MG IJ SOLR
2.0000 mg | Freq: Once | INTRAMUSCULAR | Status: DC
  Filled 2017-01-29: qty 2

## 2017-01-29 MED ORDER — HEPARIN SOD (PORK) LOCK FLUSH 100 UNIT/ML IV SOLN
250.0000 [IU] | Freq: Once | INTRAVENOUS | Status: AC
  Administered 2017-01-29: 250 [IU]
  Filled 2017-01-29: qty 5

## 2017-01-29 MED ORDER — SODIUM CHLORIDE 0.9% FLUSH
10.0000 mL | Freq: Once | INTRAVENOUS | Status: AC
  Administered 2017-01-29: 10 mL
  Filled 2017-01-29: qty 10

## 2017-01-29 MED FILL — OXYCOD/ACETAMINOPHEN 5-325M: 5-325 | 7 days supply | Qty: 60 | Fill #0

## 2017-01-29 NOTE — Progress Notes (Signed)
Pt came in for flush and lab today. Labs unable to be taken from picc d/t no blood return, per flush nurse. Pt went to lab for peripheral stick. Attempted to flush picc line and was able to obtain blood return. Pt picc positional. Flushed with 10cc of saline and 250units of heparin. Pt to return next Monday for flush. Pt hg stable today. Noted WBC and platelet elevation. Lindsey,NP notified. Pt asymptomatic at this time. Home care nurse, Ailene Ravel RN notified as well.

## 2017-02-01 ENCOUNTER — Ambulatory Visit (HOSPITAL_BASED_OUTPATIENT_CLINIC_OR_DEPARTMENT_OTHER): Payer: Medicaid Other

## 2017-02-01 ENCOUNTER — Encounter (HOSPITAL_COMMUNITY): Payer: Self-pay

## 2017-02-01 ENCOUNTER — Other Ambulatory Visit: Payer: Self-pay

## 2017-02-01 ENCOUNTER — Emergency Department (HOSPITAL_COMMUNITY)
Admission: EM | Admit: 2017-02-01 | Discharge: 2017-02-01 | Disposition: A | Discharge status: Home / Self Care | Payer: Medicaid Other | Attending: Emergency Medicine | Admitting: Emergency Medicine

## 2017-02-01 DIAGNOSIS — Z95828 Presence of other vascular implants and grafts: Secondary | ICD-10-CM

## 2017-02-01 DIAGNOSIS — Z5189 Encounter for other specified aftercare: Secondary | ICD-10-CM

## 2017-02-01 DIAGNOSIS — C50412 Malignant neoplasm of upper-outer quadrant of left female breast: Secondary | ICD-10-CM

## 2017-02-01 DIAGNOSIS — C50911 Malignant neoplasm of unspecified site of right female breast: Secondary | ICD-10-CM

## 2017-02-01 DIAGNOSIS — D500 Iron deficiency anemia secondary to blood loss (chronic): Secondary | ICD-10-CM | POA: Insufficient documentation

## 2017-02-01 DIAGNOSIS — C7800 Secondary malignant neoplasm of unspecified lung: Secondary | ICD-10-CM

## 2017-02-01 LAB — CBC WITH DIFFERENTIAL/PLATELET
Basophils Absolute: 0 10*3/uL (ref 0.0–0.1)
Basophils Relative: 0 %
EOS ABS: 0.2 10*3/uL (ref 0.0–0.7)
Eosinophils Relative: 2 %
HEMATOCRIT: 27.3 % — AB (ref 36.0–46.0)
HEMOGLOBIN: 8.8 g/dL — AB (ref 12.0–15.0)
LYMPHS ABS: 0.9 10*3/uL (ref 0.7–4.0)
Lymphocytes Relative: 7 %
MCH: 25.8 pg — AB (ref 26.0–34.0)
MCHC: 32.2 g/dL (ref 30.0–36.0)
MCV: 80.1 fL (ref 78.0–100.0)
MONOS PCT: 9 %
Monocytes Absolute: 1.3 10*3/uL — ABNORMAL HIGH (ref 0.1–1.0)
NEUTROS ABS: 11.6 10*3/uL — AB (ref 1.7–7.7)
NEUTROS PCT: 82 %
Platelets: 626 10*3/uL — ABNORMAL HIGH (ref 150–400)
RBC: 3.41 MIL/uL — AB (ref 3.87–5.11)
RDW: 16.5 % — ABNORMAL HIGH (ref 11.5–15.5)
WBC: 14.1 10*3/uL — AB (ref 4.0–10.5)

## 2017-02-01 LAB — BASIC METABOLIC PANEL
ANION GAP: 10 (ref 5–15)
BUN: 16 mg/dL (ref 6–20)
CALCIUM: 9.2 mg/dL (ref 8.9–10.3)
CO2: 27 mmol/L (ref 22–32)
Chloride: 100 mmol/L — ABNORMAL LOW (ref 101–111)
Creatinine, Ser: 0.71 mg/dL (ref 0.44–1.00)
GFR calc Af Amer: >60 mL/min (ref >60)
GFR calc non Af Amer: >60 mL/min (ref >60)
GLUCOSE: 125 mg/dL — AB (ref 65–99)
POTASSIUM: 4.3 mmol/L (ref 3.5–5.1)
Sodium: 137 mmol/L (ref 135–145)

## 2017-02-01 LAB — TYPE AND SCREEN
ABO/RH(D): A POS
Antibody Screen: NEGATIVE

## 2017-02-01 MED ORDER — OXYCODONE-ACETAMINOPHEN 5-325 MG PO TABS
1.0000 | ORAL_TABLET | Freq: Once | ORAL | Status: AC
  Administered 2017-02-01: 1 via ORAL
  Filled 2017-02-01: qty 1

## 2017-02-01 MED ORDER — METRONIDAZOLE 500 MG PO TABS: 500.0000 mg | ORAL_TABLET | Freq: Three times a day (TID) | ORAL | 0 refills | Status: DC

## 2017-02-01 MED ORDER — HEPARIN SOD (PORK) LOCK FLUSH 100 UNIT/ML IV SOLN
250.0000 [IU] | Freq: Once | INTRAVENOUS | Status: AC
  Administered 2017-02-01: 250 [IU]
  Filled 2017-02-01: qty 5

## 2017-02-01 MED ORDER — SODIUM CHLORIDE 0.9% FLUSH
10.0000 mL | Freq: Once | INTRAVENOUS | Status: AC
  Administered 2017-02-01: 10 mL
  Filled 2017-02-01: qty 10

## 2017-02-01 NOTE — Progress Notes (Signed)
Pt came in for flush and picc line dressing change today. Pt requested for breast wound dressing to be changed. Flush nurse attempted to change dressing but was unable to remove dressing on the wound due to tissue stuck to abd pad and potential for bleeding. Nurse had reinforced dressing with 4x4's and kerlex. Pt was sent to collab nurse to speak with pt and to assess wound.   This RN evaluated already reinforced wound, and attempted to remove dressing on top of wound itself. Normal saline was poured and flushed over the old abd pad,and saline flushed underneath abd pad/ actual wound. There were tissue build up and  unable to remove old dressing from wound due to profuse bleeding. Applied pressure for 5 minutes on the wound.Did not attempt to remove any more of the old dressing, instead, reinforced with multiple 4x4's and advised pt to get dressing changed in the ED, where they could use special dressing to stop wound bleeding. Pt verbalized understanding and will head over to the emergency room now.   Called and spoke with wound care center. Informed that dressing used was a calcium alginate silver and carboflex, which means that the dressing should come off in gel form when wet. This should make for easy removal of dressing from the actual wound. Pt was last seen at the wound care center last week. Was advised by wound care nurse to have pt go to ED at this point due to difficulty with wound tissue and bleeding.

## 2017-02-01 NOTE — ED Provider Notes (Signed)
[facility] DEPT Provider Note   CSN: [id number] Arrival date & time: 02/01/17  1602     History   Chief Complaint Chief Complaint  Patient presents with  . bleeding from port    HPI Addis Bennie is a 61 y.o. female.  61 year old female with history of left breast CA presents from the [facility] with bleeding to the site after the attempted to change her dressing. Was hospitalized over week ago due to excessive bleeding from this necrotic area that required blood transfusion. Denies any current weakness, fever, skin changes. Bleeding of the cancer center was controlled with direct pressure. Denied any bleeding prior to today. Was told to come in for further management.      Past Medical History:  Diagnosis Date  . Cancer ([place])   . Sinusitis   . Tumor cells     Patient Active Problem List   Diagnosis Date Noted  . Blood loss anemia 01/18/2017  . Port catheter in place 12/02/2016  . Necrotizing soft tissue infection 11/21/2016  . Cellulitis 10/28/2016  . Wound infection 10/28/2016  . Cellulitis of left breast 10/28/2016  . Encounter for palliative care   . Goals of care, counseling/discussion   . Breast cancer metastasized to lung, right ([place]) 09/25/2016  . Malnutrition of moderate degree 09/25/2016  . Breast cancer of upper-outer quadrant of left female breast ([place] [place]) 10/22/2015  . THYROID NODULE, RIGHT 05/31/2007  . DEPRESSION 05/31/2007  . GERD 05/31/2007  . HOT FLASHES 05/31/2007  . HEADACHE 05/31/2007    Past Surgical History:  Procedure Laterality Date  . BIOPSY BREAST    . IR FLUORO GUIDE CV LINE RIGHT  11/06/2016  . IR US GUIDE VASC ACCESS RIGHT  11/06/2016    OB History    No data available       Home Medications    Prior to Admission medications   Medication Sig Start Date End Date Taking? Authorizing Provider  feeding supplement, ENSURE ENLIVE, (ENSURE ENLIVE) LIQD Take 237 mLs by mouth 3 (three) times daily between meals. 09/26/16  Yes  [name], MD  diphenoxylate-atropine (LOMOTIL) 2.5-0.025 MG tablet Take 1 tablet by mouth 4 (four) times daily as needed for diarrhea or loose stools. Patient not taking: Reported on 01/25/2017 01/11/17   Gardenia Phlegm, NP  loperamide (IMODIUM) 2 MG capsule Take 1 capsule (2 mg total) by mouth as needed for diarrhea or loose stools. Take as directed on package Patient not taking: Reported on 01/25/2017 01/06/17   Magrinat, Virgie Dad, MD  ondansetron (ZOFRAN) 8 MG tablet Take 1 tablet (8 mg total) by mouth every 8 (eight) hours as needed for nausea or vomiting. 01/26/17   Nicholas Lose, MD  oxyCODONE-acetaminophen (PERCOCET/ROXICET) 5-325 MG tablet Take 2 tablets by mouth every 6 (six) hours as needed for severe pain. Use for severe pain - Do not take with Tylenol as this tablet already contains tylenol 01/29/17   Causey, Charlestine Massed, NP    Family History Family History  Problem Relation Age of Onset  . Breast cancer Neg Hx     Social History Social History  Substance Use Topics  . Smoking status: Never Smoker  . Smokeless tobacco: Never Used  . Alcohol use No     Allergies   Aspirin   Review of Systems Review of Systems  All other systems reviewed and are negative.    Physical Exam Updated Vital Signs BP 140/70 (BP Location: Left Arm)   Pulse (!) 112  Temp 98.4 F (36.9 C) (Oral)   Resp 20   Ht 1.549 m (5\' 1" )   Wt 46.7 kg (103 lb)   SpO2 100%   BMI 19.46 kg/m   Physical Exam  Constitutional: She is oriented to person, place, and time. She appears well-developed and well-nourished.  Non-toxic appearance. No distress.  HENT:  Head: Normocephalic and atraumatic.  Eyes: Pupils are equal, round, and reactive to light. Conjunctivae, EOM and lids are normal.  Neck: Normal range of motion. Neck supple. No tracheal deviation present. No thyroid mass present.  Cardiovascular: Normal rate, regular rhythm and normal heart sounds.  Exam reveals no gallop.   No  murmur heard. Pulmonary/Chest: Effort normal and breath sounds normal. No stridor. No respiratory distress. She has no decreased breath sounds. She has no wheezes. She has no rhonchi. She has no rales.    Abdominal: Soft. Normal appearance and bowel sounds are normal. She exhibits no distension. There is no tenderness. There is no rebound and no CVA tenderness.  Musculoskeletal: Normal range of motion. She exhibits no edema or tenderness.  Neurological: She is alert and oriented to person, place, and time. She has normal strength. No cranial nerve deficit or sensory deficit. GCS eye subscore is 4. GCS verbal subscore is 5. GCS motor subscore is 6.  Skin: Skin is warm and dry. No abrasion and no rash noted.  Psychiatric: She has a normal mood and affect. Her speech is normal and behavior is normal.  Nursing note and vitals reviewed.    ED Treatments / Results  Labs (all labs ordered are listed, but only abnormal results are displayed) Labs Reviewed  CBC WITH DIFFERENTIAL/PLATELET  BASIC METABOLIC PANEL  TYPE AND SCREEN    EKG  EKG Interpretation None       Radiology No results found.  Procedures Procedures (including critical care time)  Medications Ordered in ED Medications - No data to display   Initial Impression / Assessment and Plan / ED Course  I have reviewed the triage vital signs and the nursing notes.  Pertinent labs & imaging results that were available during my care of the patient were reviewed by me and considered in my medical decision making (see chart for details).     Patient's hemoglobin is stable and her bleeding is controlled. Spoke with the wound care clinic and they will see her tomorrow.  Final Clinical Impressions(s) / ED Diagnoses   Final diagnoses:  None    New Prescriptions New Prescriptions   No medications on file     Lacretia Leigh, MD 02/01/17 [phone]

## 2017-02-01 NOTE — ED Notes (Signed)
Pt requested pain medication and Dr. Madison Hickman provided an order.

## 2017-02-01 NOTE — Discharge Instructions (Signed)
Follow-up in the wound care clinic tomorrow at 1:45 PM

## 2017-02-01 NOTE — ED Triage Notes (Signed)
Patient was having her port dressing changed and staff noted blood from the port. Patient was sent from the cancer center.

## 2017-02-01 NOTE — ED Notes (Signed)
Bandage patients wound with assistance by Reyes Ivan RN. Applied 2 ABD Gauzes, secured with kerlix x 2 and brown coban (patients request). Pt tolerated it well.

## 2017-02-02 DIAGNOSIS — L03313 Cellulitis of chest wall: Secondary | ICD-10-CM | POA: Diagnosis not present

## 2017-02-03 ENCOUNTER — Telehealth: Payer: Self-pay

## 2017-02-03 ENCOUNTER — Encounter (HOSPITAL_COMMUNITY): Payer: Self-pay | Admitting: Emergency Medicine

## 2017-02-03 ENCOUNTER — Ambulatory Visit (HOSPITAL_BASED_OUTPATIENT_CLINIC_OR_DEPARTMENT_OTHER): Payer: Medicaid Other

## 2017-02-03 ENCOUNTER — Encounter: Payer: Self-pay | Admitting: *Deleted

## 2017-02-03 ENCOUNTER — Emergency Department (HOSPITAL_COMMUNITY)
Admission: EM | Admit: 2017-02-03 | Discharge: 2017-02-03 | Disposition: A | Discharge status: Home / Self Care | Payer: Medicaid Other | Attending: Emergency Medicine | Admitting: Emergency Medicine

## 2017-02-03 DIAGNOSIS — C50911 Malignant neoplasm of unspecified site of right female breast: Secondary | ICD-10-CM

## 2017-02-03 DIAGNOSIS — C7800 Secondary malignant neoplasm of unspecified lung: Secondary | ICD-10-CM

## 2017-02-03 DIAGNOSIS — Z452 Encounter for adjustment and management of vascular access device: Secondary | ICD-10-CM | POA: Diagnosis present

## 2017-02-03 DIAGNOSIS — G8918 Other acute postprocedural pain: Secondary | ICD-10-CM | POA: Insufficient documentation

## 2017-02-03 DIAGNOSIS — Z95828 Presence of other vascular implants and grafts: Secondary | ICD-10-CM

## 2017-02-03 DIAGNOSIS — Z79899 Other long term (current) drug therapy: Secondary | ICD-10-CM | POA: Insufficient documentation

## 2017-02-03 DIAGNOSIS — C773 Secondary and unspecified malignant neoplasm of axilla and upper limb lymph nodes: Secondary | ICD-10-CM

## 2017-02-03 DIAGNOSIS — C50412 Malignant neoplasm of upper-outer quadrant of left female breast: Secondary | ICD-10-CM | POA: Diagnosis not present

## 2017-02-03 DIAGNOSIS — Z5189 Encounter for other specified aftercare: Secondary | ICD-10-CM

## 2017-02-03 MED ORDER — OXYCODONE-ACETAMINOPHEN 5-325 MG PO TABS
1.0000 | ORAL_TABLET | Freq: Once | ORAL | Status: AC
  Administered 2017-02-03: 1 via ORAL
  Filled 2017-02-03: qty 1

## 2017-02-03 MED ORDER — SODIUM CHLORIDE 0.9% FLUSH
10.0000 mL | Freq: Once | INTRAVENOUS | Status: AC
  Administered 2017-02-03: 10 mL
  Filled 2017-02-03: qty 10

## 2017-02-03 MED ORDER — HEPARIN SOD (PORK) LOCK FLUSH 100 UNIT/ML IV SOLN
250.0000 [IU] | Freq: Once | INTRAVENOUS | Status: AC
  Administered 2017-02-03: 250 [IU]
  Filled 2017-02-03: qty 5

## 2017-02-03 NOTE — ED Notes (Signed)
New dressing applied to wound. EDP evaluated wound prior to new dressing being applied

## 2017-02-03 NOTE — ED Triage Notes (Signed)
Pt was seen here on Monday for a bleeding area of necrotic tissue on her left breast  Pt has hx of breast ca  Pt states she was seen at the wound care center yesterday and was told to come here for a dressing change/wound check

## 2017-02-03 NOTE — Patient Instructions (Signed)
PICC Home Guide °A peripherally inserted central catheter (PICC) is a long, thin, flexible tube that is inserted into a vein in the upper arm. It is a form of intravenous (IV) access. It is considered to be a "central" line because the tip of the PICC ends in a large vein in your chest. This large vein is called the superior vena cava (SVC). The PICC tip ends in the SVC because there is a lot of blood flow in the SVC. This allows medicines and IV fluids to be quickly distributed throughout the body. The PICC is inserted using a sterile technique by a specially trained nurse or physician. After the PICC is inserted, a chest X-ray exam is done to be sure it is in the correct place. °A PICC may be placed for different reasons, such as: °· To give medicines and liquid nutrition that can only be given through a central line. Examples are: °? Certain antibiotic treatments. °? Chemotherapy. °? Total parenteral nutrition (TPN). °· To take frequent blood samples. °· To give IV fluids and blood products. °· If there is difficulty placing a peripheral intravenous (PIV) catheter. ° °If taken care of properly, a PICC can remain in place for several months. A PICC can also allow a person to go home from the hospital early. Medicine and PICC care can be managed at home by a family member or home health care team. °What problems can happen when I have a PICC? °Problems with a PICC can occasionally occur. These may include the following: °· A blood clot (thrombus) forming in or at the tip of the PICC. This can cause the PICC to become clogged. A clot-dissolving medicine called tissue plasminogen activator (tPA) can be given through the PICC to help break up the clot. °· Inflammation of the vein (phlebitis) in which the PICC is placed. Signs of inflammation may include redness, pain at the insertion site, red streaks, or being able to feel a "cord" in the vein where the PICC is located. °· Infection in the PICC or at the insertion  site. Signs of infection may include fever, chills, redness, swelling, or pus drainage from the PICC insertion site. °· PICC movement (malposition). The PICC tip may move from its original position due to excessive physical activity, forceful coughing, sneezing, or vomiting. °· A break or cut in the PICC. It is important to not use scissors near the PICC. °· Nerve or tendon irritation or injury during PICC insertion. ° °What should I keep in mind about activities when I have a PICC? °· You may bend your arm and move it freely. If your PICC is near or at the bend of your elbow, avoid activity with repeated motion at the elbow. °· Rest at home for the remainder of the day following PICC line insertion. °· Avoid lifting heavy objects as instructed by your health care provider. °· Avoid using a crutch with the arm on the same side as your PICC. You may need to use a walker. °What should I know about my PICC dressing? °· Keep your PICC bandage (dressing) clean and dry to prevent infection. °? Ask your health care provider when you may shower. Ask your health care provider to teach you how to wrap the PICC when you do take a shower. °· Change the PICC dressing as instructed by your health care provider. °· Change your PICC dressing if it becomes loose or wet. °What should I know about PICC care? °· Check the PICC insertion   site daily for leakage, redness, swelling, or pain. °· Do not take a bath, swim, or use hot tubs when you have a PICC. Cover PICC line with clear plastic wrap and tape to keep it dry while showering. °· Flush the PICC as directed by your health care provider. Let your health care provider know right away if the PICC is difficult to flush or does not flush. Do not use force to flush the PICC. °· Do not use a syringe that is less than 10 mL to flush the PICC. °· Never pull or tug on the PICC. °· Avoid blood pressure checks on the arm with the PICC. °· Keep your PICC identification card with you at all  times. °· Do not take the PICC out yourself. Only a trained clinical professional should remove the PICC. °Get help right away if: °· Your PICC is accidentally pulled all the way out. If this happens, cover the insertion site with a bandage or gauze dressing. Do not throw the PICC away. Your health care provider will need to inspect it. °· Your PICC was tugged or pulled and has partially come out. Do not  push the PICC back in. °· There is any type of drainage, redness, or swelling where the PICC enters the skin. °· You cannot flush the PICC, it is difficult to flush, or the PICC leaks around the insertion site when it is flushed. °· You hear a "flushing" sound when the PICC is flushed. °· You have pain, discomfort, or numbness in your arm, shoulder, or jaw on the same side as the PICC. °· You feel your heart "racing" or skipping beats. °· You notice a hole or tear in the PICC. °· You develop chills or a fever. °This information is not intended to replace advice given to you by your health care provider. Make sure you discuss any questions you have with your health care provider. °Document Released: 11/15/2002 Document Revised: 11/29/2015 Document Reviewed: 03/03/2013 °Elsevier Interactive Patient Education © 2017 [employer] ° °

## 2017-02-03 NOTE — Progress Notes (Signed)
[place] Work  Patient visited [place] office requesting bus pass to get home and for upcoming treatment.  CSW provided patient with bus pass for this afternoon as well as all day pass for Friday (treatment and MD visit) and Monday (flush visit).  CSW also provided patient with food bag.  Maryjean Morn, MSW, LCSW, OSW-C Clinical Social Worker [facility] [phone]
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
## 2017-02-03 NOTE — ED Provider Notes (Signed)
[facility] DEPT Provider Note   CSN: [id number] Arrival date & time: 02/03/17  1445     History   Chief Complaint Chief Complaint  Patient presents with  . Wound Check    HPI Ann Oliver is a 61 y.o. female with hx of breast CA, presents to the ED for a wound check and dressing change. Patient reports that she was sent here 2 days ago from the cancer center for excessive bleeding from the necrotic area of her wound that required a transfusion. On the visit 2 days ago the patient did not feel dizzy and the bleeding was controlled with pressure. Patient's hemoglobin was stable. Patient was to f/u with the wound care center the following day.  Patient reports she did f/u with the wound center yesterday and today she called home health but they told her to come to the ED.    The history is provided by the patient. No language interpreter was used.  Wound Check     Past Medical History:  Diagnosis Date  . Cancer ([place])   . Sinusitis   . Tumor cells     Patient Active Problem List   Diagnosis Date Noted  . Blood loss anemia 01/18/2017  . Port catheter in place 12/02/2016  . Necrotizing soft tissue infection 11/21/2016  . Cellulitis 10/28/2016  . Wound infection 10/28/2016  . Cellulitis of left breast 10/28/2016  . Encounter for palliative care   . Goals of care, counseling/discussion   . Breast cancer metastasized to lung, right ([place]) 09/25/2016  . Malnutrition of moderate degree 09/25/2016  . Breast cancer of upper-outer quadrant of left female breast ([place]) 10/22/2015  . THYROID NODULE, RIGHT 05/31/2007  . DEPRESSION 05/31/2007  . GERD 05/31/2007  . HOT FLASHES 05/31/2007  . HEADACHE 05/31/2007    Past Surgical History:  Procedure Laterality Date  . BIOPSY BREAST    . IR FLUORO GUIDE CV LINE RIGHT  11/06/2016  . IR US GUIDE VASC ACCESS RIGHT  11/06/2016    OB History    No data available       Home Medications    Prior to Admission medications     Medication Sig Start Date End Date Taking? Authorizing Provider  diphenoxylate-atropine (LOMOTIL) 2.5-0.025 MG tablet Take 1 tablet by mouth 4 (four) times daily as needed for diarrhea or loose stools. Patient not taking: Reported on 01/25/2017 01/11/17   Gardenia Phlegm, NP  feeding supplement, ENSURE ENLIVE, (ENSURE ENLIVE) LIQD Take 237 mLs by mouth 3 (three) times daily between meals. 09/26/16   [name], MD  loperamide (IMODIUM) 2 MG capsule Take 1 capsule (2 mg total) by mouth as needed for diarrhea or loose stools. Take as directed on package Patient not taking: Reported on 01/25/2017 01/06/17   Magrinat, Virgie Dad, MD  ondansetron (ZOFRAN) 8 MG tablet Take 1 tablet (8 mg total) by mouth every 8 (eight) hours as needed for nausea or vomiting. 01/26/17   Nicholas Lose, MD  oxyCODONE-acetaminophen (PERCOCET/ROXICET) 5-325 MG tablet Take 2 tablets by mouth every 6 (six) hours as needed for severe pain. Use for severe pain - Do not take with Tylenol as this tablet already contains tylenol 01/29/17   Causey, Charlestine Massed, NP    Family History Family History  Problem Relation Age of Onset  . Breast cancer Neg Hx     Social History Social History  Substance Use Topics  . Smoking status: Never Smoker  . Smokeless tobacco: Never Used  .  Alcohol use No     Allergies   Aspirin   Review of Systems Review of Systems  Constitutional: Negative for chills and fever.  Skin: Positive for wound.     Physical Exam Updated Vital Signs BP 133/74 (BP Location: Left Arm)   Pulse 94   Temp 98 F (36.7 C) (Oral)   Resp 18   Wt 46.7 kg (103 lb)   SpO2 100%   BMI 19.46 kg/m   Physical Exam  Constitutional: No distress.  Neck: Neck supple.  Cardiovascular: Normal rate.   Pulmonary/Chest: Effort normal.  Dressing taken off down to the area where the patient states the doctor says do not remove. Small amount of drainage on the dressing. Dressing reapplied by RN  Neurological:  She is alert.  Skin:  Wound left breast  Psychiatric: She has a normal mood and affect.     ED Treatments / Results  Labs (all labs ordered are listed, but only abnormal results are displayed) Labs Reviewed - No data to display  Radiology No results found.  Procedures Procedures (including critical care time)  Medications Ordered in ED Medications  oxyCODONE-acetaminophen (PERCOCET/ROXICET) 5-325 MG per tablet 1 tablet (not administered)   61 y.o. female with breast cancer and here for dressing change stable for d/c without fever or heavy bleeding from the site. She is scheduled for home health tomorrow and the cancer center the following day. Encouraged patient to keep all appointments.   Initial Impression / Assessment and Plan / ED Course  I have reviewed the triage vital signs and the nursing notes.   Final Clinical Impressions(s) / ED Diagnoses   Final diagnoses:  Visit for wound check    New Prescriptions New Prescriptions   No medications on file     Ashley Murrain, NP 02/03/17 2132    Isla Pence, MD 02/03/17 2235

## 2017-02-03 NOTE — Discharge Instructions (Signed)
We have changed your dressing today. Have the home health nurse change it tomorrow or the wound center. Keep your appointment with the Cancer center on the 14th. Return here as needed.

## 2017-02-04 ENCOUNTER — Encounter (HOSPITAL_COMMUNITY): Payer: Self-pay | Admitting: Emergency Medicine

## 2017-02-04 ENCOUNTER — Emergency Department (HOSPITAL_COMMUNITY)
Admission: EM | Admit: 2017-02-04 | Discharge: 2017-02-04 | Disposition: A | Discharge status: Home / Self Care | Payer: Medicaid Other | Attending: Emergency Medicine | Admitting: Emergency Medicine

## 2017-02-04 ENCOUNTER — Other Ambulatory Visit: Payer: Self-pay

## 2017-02-04 ENCOUNTER — Other Ambulatory Visit: Payer: Self-pay | Admitting: Oncology

## 2017-02-04 DIAGNOSIS — C50412 Malignant neoplasm of upper-outer quadrant of left female breast: Secondary | ICD-10-CM

## 2017-02-04 DIAGNOSIS — Z170 Estrogen receptor positive status [ER+]: Principal | ICD-10-CM

## 2017-02-04 DIAGNOSIS — Z79899 Other long term (current) drug therapy: Secondary | ICD-10-CM | POA: Insufficient documentation

## 2017-02-04 DIAGNOSIS — R0789 Other chest pain: Secondary | ICD-10-CM | POA: Diagnosis not present

## 2017-02-04 DIAGNOSIS — R079 Chest pain, unspecified: Secondary | ICD-10-CM | POA: Diagnosis present

## 2017-02-04 LAB — BASIC METABOLIC PANEL
Anion gap: 9 (ref 5–15)
BUN: 14 mg/dL (ref 6–20)
CO2: 27 mmol/L (ref 22–32)
Calcium: 9.2 mg/dL (ref 8.9–10.3)
Chloride: 101 mmol/L (ref 101–111)
Creatinine, Ser: 0.75 mg/dL (ref 0.44–1.00)
GFR calc Af Amer: >60 mL/min (ref >60)
GFR calc non Af Amer: >60 mL/min (ref >60)
Glucose, Bld: 95 mg/dL (ref 65–99)
Potassium: 4 mmol/L (ref 3.5–5.1)
Sodium: 137 mmol/L (ref 135–145)

## 2017-02-04 LAB — CBC WITH DIFFERENTIAL/PLATELET
Basophils Absolute: 0 10*3/uL (ref 0.0–0.1)
Basophils Relative: 0 %
Eosinophils Absolute: 0.1 10*3/uL (ref 0.0–0.7)
Eosinophils Relative: 1 %
HCT: 26.9 % — ABNORMAL LOW (ref 36.0–46.0)
Hemoglobin: 8.6 g/dL — ABNORMAL LOW (ref 12.0–15.0)
Lymphocytes Relative: 6 %
Lymphs Abs: 0.9 10*3/uL (ref 0.7–4.0)
MCH: 25.3 pg — ABNORMAL LOW (ref 26.0–34.0)
MCHC: 32 g/dL (ref 30.0–36.0)
MCV: 79.1 fL (ref 78.0–100.0)
Monocytes Absolute: 1.4 10*3/uL — ABNORMAL HIGH (ref 0.1–1.0)
Monocytes Relative: 9 %
Neutro Abs: 12.5 10*3/uL — ABNORMAL HIGH (ref 1.7–7.7)
Neutrophils Relative %: 84 %
Platelets: 595 10*3/uL — ABNORMAL HIGH (ref 150–400)
RBC: 3.4 MIL/uL — ABNORMAL LOW (ref 3.87–5.11)
RDW: 17.1 % — ABNORMAL HIGH (ref 11.5–15.5)
WBC: 14.9 10*3/uL — ABNORMAL HIGH (ref 4.0–10.5)

## 2017-02-04 MED ORDER — METRONIDAZOLE 500 MG PO TABS
500.0000 mg | ORAL_TABLET | Freq: Once | ORAL | Status: AC
  Administered 2017-02-04: 500 mg via ORAL
  Filled 2017-02-04: qty 1

## 2017-02-04 MED ORDER — ONDANSETRON HCL 8 MG PO TABS: 8.0000 mg | ORAL_TABLET | Freq: Three times a day (TID) | ORAL | 0 refills | Status: DC | PRN

## 2017-02-04 MED ORDER — OXYCODONE-ACETAMINOPHEN 5-325 MG PO TABS: 1.0000 | ORAL_TABLET | Freq: Once | ORAL | Status: DC

## 2017-02-04 MED ORDER — MORPHINE SULFATE (PF) 2 MG/ML IV SOLN
2.0000 mg | Freq: Once | INTRAVENOUS | Status: AC
  Administered 2017-02-04: 2 mg via INTRAVENOUS
  Filled 2017-02-04: qty 1

## 2017-02-04 MED ORDER — VANCOMYCIN HCL IN DEXTROSE 1-5 GM/200ML-% IV SOLN
1000.0000 mg | Freq: Once | INTRAVENOUS | Status: AC
  Administered 2017-02-04: 1000 mg via INTRAVENOUS
  Filled 2017-02-04: qty 200

## 2017-02-04 MED ORDER — OXYCODONE-ACETAMINOPHEN 5-325 MG PO TABS
ORAL_TABLET | ORAL | Status: AC
  Filled 2017-02-04: qty 1

## 2017-02-04 MED ORDER — HYDROMORPHONE HCL 2 MG PO TABS: 2.0000 mg | ORAL_TABLET | ORAL | 0 refills | Status: DC | PRN

## 2017-02-04 MED ORDER — FENTANYL 25 MCG/HR TD PT72: 25.0000 ug | MEDICATED_PATCH | TRANSDERMAL | 0 refills | Status: DC

## 2017-02-04 NOTE — ED Notes (Signed)
Nils Flack, PA made aware pt requesting something else for pain.

## 2017-02-04 NOTE — ED Notes (Signed)
ED Provider at bedside. 

## 2017-02-04 NOTE — Discharge Instructions (Signed)
Continue taking the antibiotic given to you by [name] twice daily as prescribed for 2 weeks total. Go to his office today for medication management and pain management. Follow up with the wound clinic for management of your wound. Return to the ED if you have any concerning signs or symptoms.

## 2017-02-04 NOTE — ED Provider Notes (Signed)
[facility] DEPT Provider Note   CSN: [id number] Arrival date & time: 02/04/17  0553     History   Chief Complaint Chief Complaint  Patient presents with  . Abscess    HPI Ann Oliver is a 61 y.o. female with history of breast cancer with fungating tumor to the left breast who presents today with chief complaint acute onset, intermittent left-sided chest pain with radiation "all over" since yesterday evening. She was seen and evaluated yesterday for a wound check. She was hospitalized 2.5 weeks ago due to excessive bleeding from a necrotic area on her chest wall that required a blood transfusion. Dressing was changed yesterday and she was found to be stable for discharge home. She has home health scheduled today and a [facility] appointment the following day. She states that chest pain is sharp, associated with nausea and shortness of breath when intensifies. No aggravating or alleviating factors noted. She has not tried anything for her symptoms and states that she is out of her pain medication prescribed by her oncologist. She does state that this pain feels exactly the same as how her wound typically feels and states "I don't think it's my heart". Endorses subjective fevers and chills. Denies abdominal pain, vomiting. Her oncologist is Dr. Altamese Dilling and she has a PICC line in place in the right before meals. She undergoes Taxol treatment every 3 weeks.  The history is provided by the patient.    Past Medical History:  Diagnosis Date  . Cancer ([place])   . Sinusitis   . Tumor cells     Patient Active Problem List   Diagnosis Date Noted  . Blood loss anemia 01/18/2017  . Port catheter in place 12/02/2016  . Necrotizing soft tissue infection 11/21/2016  . Cellulitis 10/28/2016  . Wound infection 10/28/2016  . Cellulitis of left breast 10/28/2016  . Encounter for palliative care   . Goals of care, counseling/discussion   . Breast cancer metastasized to lung, right ([place])  09/25/2016  . Malnutrition of moderate degree 09/25/2016  . Breast cancer of upper-outer quadrant of left female breast ([place]) 10/22/2015  . THYROID NODULE, RIGHT 05/31/2007  . DEPRESSION 05/31/2007  . GERD 05/31/2007  . HOT FLASHES 05/31/2007  . HEADACHE 05/31/2007    Past Surgical History:  Procedure Laterality Date  . BIOPSY BREAST    . IR FLUORO GUIDE CV LINE RIGHT  11/06/2016  . IR US GUIDE VASC ACCESS RIGHT  11/06/2016    OB History    No data available       Home Medications    Prior to Admission medications   Medication Sig Start Date End Date Taking? Authorizing Provider  feeding supplement, ENSURE ENLIVE, (ENSURE ENLIVE) LIQD Take 237 mLs by mouth 3 (three) times daily between meals. 09/26/16  Yes [name], MD  metroNIDAZOLE (FLAGYL) 500 MG tablet Take 1 tablet by mouth 3 (three) times daily. Started 09/10 for 14 days 02/01/17  Yes [provider]  ondansetron (ZOFRAN) 8 MG tablet Take 1 tablet (8 mg total) by mouth every 8 (eight) hours as needed for nausea or vomiting. 01/26/17  Yes Nicholas Lose, MD  oxyCODONE-acetaminophen (PERCOCET/ROXICET) 5-325 MG tablet Take 2 tablets by mouth every 6 (six) hours as needed for severe pain. Use for severe pain - Do not take with Tylenol as this tablet already contains tylenol 01/29/17  Yes Causey, Charlestine Massed, NP  diphenoxylate-atropine (LOMOTIL) 2.5-0.025 MG tablet Take 1 tablet by mouth 4 (four) times daily as  needed for diarrhea or loose stools. Patient not taking: Reported on 01/25/2017 01/11/17   Gardenia Phlegm, NP  loperamide (IMODIUM) 2 MG capsule Take 1 capsule (2 mg total) by mouth as needed for diarrhea or loose stools. Take as directed on package Patient not taking: Reported on 01/25/2017 01/06/17   Magrinat, Virgie Dad, MD    Family History Family History  Problem Relation Age of Onset  . Breast cancer Neg Hx     Social History Social History  Substance Use Topics  . Smoking status: Never Smoker    . Smokeless tobacco: Never Used  . Alcohol use No     Allergies   Aspirin   Review of Systems Review of Systems  Constitutional: Positive for chills and fever.  Respiratory: Positive for shortness of breath.   Cardiovascular: Positive for chest pain.  Gastrointestinal: Positive for nausea. Negative for abdominal pain and vomiting.  Skin: Positive for wound.  All other systems reviewed and are negative.    Physical Exam Updated Vital Signs BP 118/71   Pulse 92   Temp 98.9 F (37.2 C) (Oral)   Resp 18   Ht 5\' 1"  (1.549 m)   Wt 52.2 kg (115 lb)   SpO2 100%   BMI 21.73 kg/m   Physical Exam  Constitutional: She appears well-developed and well-nourished. No distress.  Thin and chronically ill-appearing  HENT:  Head: Normocephalic and atraumatic.  Eyes: Conjunctivae are normal. Right eye exhibits no discharge. Left eye exhibits no discharge.  Neck: Normal range of motion. Neck supple. No JVD present. No tracheal deviation present.  Cardiovascular: Normal rate, regular rhythm, normal heart sounds and intact distal pulses.   2+ radial and DP/PT pulses bl, negative Homan's bl   Pulmonary/Chest: Effort normal. She exhibits tenderness.  Diffuse chest wall tenderness to palpation, parasternally and overlying the left breast more than laterally. Equal rise and fall of chest. Bandage in place circumferentially around the chest wall, fungating tumor to the left breast, appears to be draining foul-smelling brown-yellow drainage. No surrounding erythema or swelling. Mass is covered by gauze.   Abdominal: Soft. She exhibits no distension. There is no tenderness.  Musculoskeletal: She exhibits no edema.  Neurological: She is alert.  Skin: Skin is warm and dry. No erythema.  Fungating tumor to the left breast  Psychiatric: She has a normal mood and affect. Her behavior is normal.  Nursing note and vitals reviewed.    ED Treatments / Results  Labs (all labs ordered are listed,  but only abnormal results are displayed) Labs Reviewed  CBC WITH DIFFERENTIAL/PLATELET - Abnormal; Notable for the following:       Result Value   WBC 14.9 (*)    RBC 3.40 (*)    Hemoglobin 8.6 (*)    HCT 26.9 (*)    MCH 25.3 (*)    RDW 17.1 (*)    Platelets 595 (*)    Neutro Abs 12.5 (*)    Monocytes Absolute 1.4 (*)    All other components within normal limits  BASIC METABOLIC PANEL    EKG  EKG Interpretation None       Radiology No results found.  Procedures Procedures (including critical care time)  Medications Ordered in ED Medications  morphine 2 MG/ML injection 2 mg (2 mg Intravenous Given 02/04/17 0802)  vancomycin (VANCOCIN) IVPB 1000 mg/200 mL premix (0 mg Intravenous Stopped 02/04/17 0957)  metroNIDAZOLE (FLAGYL) tablet 500 mg (500 mg Oral Given 02/04/17 0857)     Initial  Impression / Assessment and Plan / ED Course  I have reviewed the triage vital signs and the nursing notes.  Pertinent labs & imaging results that were available during my care of the patient were reviewed by me and considered in my medical decision making (see chart for details).     Patient with chest wall pain and fungating tumor of the left breast. Pain reproducible on palpation. Afebrile, vital signs are stable. She states pain is similar to her usual pain with her wound. She has been coming to the ED for dressing changes and pain management frequently.  leukocytosis is stable from last drawing 3 days ago, as is anemia. Doubt cardiac etiology of her symptoms. Chest pain is not pleuritic, and she is not hypoxic or tachycardic, so suspicion of PE is low. Spoke with Dr. Jana Hakim with med onc, who states he will contact Dr. Geralyn Flash office. Spoke with [name], who states that he will see the patient in his office today, and assume care of pain management.  He states that patient should not continue using the emergency department for pain management or wound care. He also recommends 1 g IV  vancomycin and Flagyl for potential infection of her chronic wound. Patient seen and evaluated by Dr. Roxanne Mins, who agrees assessment and plan at this time. On her evaluation, patient is resting comfortably. She stable for discharge and transport to the [facility] for follow-up with  [name]. Discussed indications for return to the ED.  Pt verbalized understanding of and agreement with plan and is safe for discharge home at this time.  Final Clinical Impressions(s) / ED Diagnoses   Final diagnoses:  Chest wall pain    New Prescriptions New Prescriptions   No medications on file     Debroah Baller 00/76/22 [id number]    Delora Fuel, MD 54/56/25 [phone]

## 2017-02-04 NOTE — ED Notes (Addendum)
Pt complains of SOB and ULQ pain radiating down left arm.  Cannot perform EKG due to bandage  covering torso.  PT was seen yesterday.  RN aware.

## 2017-02-04 NOTE — Progress Notes (Signed)
Pt was sent to the cancer center from the emergency room for further pain management. Discussed with Dr.Gudena that pt ran out of percocet due to increased pain and that she had been taking her pain medication every 4 hrs instead of every 6. Pt having left chest/breast pain that ended up in the ED this morning. No intervention was done in ED and pt was referred back here at the cancer center.   Obtained pain medication script for dilaudid 2mg  every 4 hrs and fentanyl  Patch 78mcg every 72hrs. In addition, obtained order for palliative consult for pain management at home. Dr.Gudena okay for patient to have picc line flushes, wound dressing changes and labs at home with [facility]. Will notify Ailene Ravel, RN [facility]). Pt states that she received some bus passes and will be here tomorrow for her 4th taxol cycle.   Pt appears and reports severe left breast pain. Requesting for pain relief prior to leaving the cancer center lobby today. Will discuss with Dr.Gudena and update pt.  1125- Obtained order for 1 tab percocet from [facility] for pt to take now. Administered percocet 1 tab for 9/10 left breast pain. Pt rides the bus home. Told pt to remain in the lobby for a while prior to going home. Pt has not other symptoms at this time. Pt verbalized understanding.  1215pm- Pt had left the waiting room. Unable to reassess pain. Will follow up with pt tomorrow.

## 2017-02-04 NOTE — ED Triage Notes (Signed)
Pt coming from home complaining of pain associated with a tumor on her left side that she has been receiving radiation treatment for. Pt states radiation was yesterday and area was dressed. Area has foul smelling odor. Pt was seen on Monday for same.

## 2017-02-05 ENCOUNTER — Ambulatory Visit: Payer: Self-pay | Admitting: Hematology and Oncology

## 2017-02-05 ENCOUNTER — Other Ambulatory Visit: Payer: Self-pay

## 2017-02-05 ENCOUNTER — Ambulatory Visit: Payer: Self-pay

## 2017-02-05 ENCOUNTER — Telehealth: Payer: Self-pay

## 2017-02-05 ENCOUNTER — Encounter (HOSPITAL_COMMUNITY): Payer: Self-pay | Admitting: Emergency Medicine

## 2017-02-05 ENCOUNTER — Emergency Department (HOSPITAL_COMMUNITY)
Admission: EM | Admit: 2017-02-05 | Discharge: 2017-02-05 | Disposition: A | Discharge status: Home / Self Care | Payer: Medicaid Other | Attending: Emergency Medicine | Admitting: Emergency Medicine

## 2017-02-05 DIAGNOSIS — Z79899 Other long term (current) drug therapy: Secondary | ICD-10-CM | POA: Insufficient documentation

## 2017-02-05 DIAGNOSIS — C50412 Malignant neoplasm of upper-outer quadrant of left female breast: Secondary | ICD-10-CM | POA: Diagnosis not present

## 2017-02-05 DIAGNOSIS — C7801 Secondary malignant neoplasm of right lung: Secondary | ICD-10-CM | POA: Diagnosis not present

## 2017-02-05 DIAGNOSIS — R222 Localized swelling, mass and lump, trunk: Secondary | ICD-10-CM | POA: Insufficient documentation

## 2017-02-05 DIAGNOSIS — Z5189 Encounter for other specified aftercare: Secondary | ICD-10-CM | POA: Diagnosis not present

## 2017-02-05 NOTE — ED Provider Notes (Signed)
[facility] DEPT Provider Note   CSN: [id number] Arrival date & time: 02/05/17  1321     History   Chief Complaint Chief Complaint  Patient presents with  . Wound Check    HPI   Blood pressure 122/90, pulse 78, temperature 98.7 F (37.1 C), temperature source Oral, resp. rate 18.  Ann Oliver is a 61 y.o. female presenting to the emergency department for wound check. Patient states that she was advised by her oncologist and wound care unit nurse to watch out for fevers or excessive bleeding of her known left-sided breast cancer, functioning breast mass. She was seen in the emergency department [facility] yesterday, her oncologist also evaluated her yesterday. She has pain medication at home. She denies any fever but states that she needs to have her temperature checked that she does not have a thermometer at home and her wound care nurse could not come to evaluate her today.    Past Medical History:  Diagnosis Date  . Cancer ([place])   . Sinusitis   . Tumor cells     Patient Active Problem List   Diagnosis Date Noted  . Blood loss anemia 01/18/2017  . Port catheter in place 12/02/2016  . Necrotizing soft tissue infection 11/21/2016  . Cellulitis 10/28/2016  . Wound infection 10/28/2016  . Cellulitis of left breast 10/28/2016  . Encounter for palliative care   . Goals of care, counseling/discussion   . Breast cancer metastasized to lung, right ([place]) 09/25/2016  . Malnutrition of moderate degree 09/25/2016  . Breast cancer of upper-outer quadrant of left female breast ([place]) 10/22/2015  . THYROID NODULE, RIGHT 05/31/2007  . DEPRESSION 05/31/2007  . GERD 05/31/2007  . HOT FLASHES 05/31/2007  . HEADACHE 05/31/2007    Past Surgical History:  Procedure Laterality Date  . BIOPSY BREAST    . IR FLUORO GUIDE CV LINE RIGHT  11/06/2016  . IR US GUIDE VASC ACCESS RIGHT  11/06/2016    OB History    No data available       Home Medications    Prior to Admission  medications   Medication Sig Start Date End Date Taking? Authorizing Provider  feeding supplement, ENSURE ENLIVE, (ENSURE ENLIVE) LIQD Take 237 mLs by mouth 3 (three) times daily between meals. 09/26/16  Yes [name], MD  fentaNYL (DURAGESIC - DOSED MCG/HR) 25 MCG/HR patch Place 1 patch (25 mcg total) onto the skin every 3 (three) days. 02/04/17  Yes Nicholas Lose, MD  HYDROmorphone (DILAUDID) 2 MG tablet Take 1 tablet (2 mg total) by mouth every 4 (four) hours as needed for severe pain. 02/04/17  Yes Nicholas Lose, MD  loperamide (IMODIUM) 2 MG capsule Take 1 capsule (2 mg total) by mouth as needed for diarrhea or loose stools. Take as directed on package 01/06/17  Yes Magrinat, Virgie Dad, MD  metroNIDAZOLE (FLAGYL) 500 MG tablet Take 1 tablet by mouth 3 (three) times daily. Started 09/10 for 14 days 02/01/17  Yes [provider]  ondansetron (ZOFRAN) 8 MG tablet Take 1 tablet (8 mg total) by mouth every 8 (eight) hours as needed for nausea or vomiting. 02/04/17  Yes Nicholas Lose, MD  diphenoxylate-atropine (LOMOTIL) 2.5-0.025 MG tablet Take 1 tablet by mouth 4 (four) times daily as needed for diarrhea or loose stools. Patient not taking: Reported on 01/25/2017 01/11/17   Gardenia Phlegm, NP    Family History Family History  Problem Relation Age of Onset  . Breast cancer Neg Hx  Social History Social History  Substance Use Topics  . Smoking status: Never Smoker  . Smokeless tobacco: Never Used  . Alcohol use No     Allergies   Aspirin   Review of Systems Review of Systems  A complete review of systems was obtained and all systems are negative except as noted in the HPI and PMH.   Physical Exam Updated Vital Signs BP 122/90   Pulse 78   Temp 98.7 F (37.1 C) (Oral)   Resp 18   Physical Exam  Constitutional: She is oriented to person, place, and time. She appears well-developed and well-nourished. No distress.  HENT:  Head: Normocephalic and atraumatic.   Mouth/Throat: Oropharynx is clear and moist.  Eyes: Pupils are equal, round, and reactive to light. Conjunctivae and EOM are normal.  Neck: Normal range of motion.  Cardiovascular: Normal rate, regular rhythm and intact distal pulses.   Pulmonary/Chest: Effort normal and breath sounds normal.  Mass to left chest wall, scant amount of foul-smelling discharge with no significant bleeding.  Abdominal: Soft. There is no tenderness.  Musculoskeletal: Normal range of motion.  Neurological: She is alert and oriented to person, place, and time.  Skin: She is not diaphoretic.  Psychiatric: She has a normal mood and affect.  Nursing note and vitals reviewed.    ED Treatments / Results  Labs (all labs ordered are listed, but only abnormal results are displayed) Labs Reviewed - No data to display  EKG  EKG Interpretation None       Radiology No results found.  Procedures Procedures (including critical care time)  Medications Ordered in ED Medications - No data to display   Initial Impression / Assessment and Plan / ED Course  I have reviewed the triage vital signs and the nursing notes.  Pertinent labs & imaging results that were available during my care of the patient were reviewed by me and considered in my medical decision making (see chart for details).     Vitals:   02/05/17 1349  BP: 122/90  Pulse: 78  Resp: 18  Temp: 98.7 F (37.1 C)  TempSrc: Oral    Ann Oliver is 61 y.o. female presenting For wound check and temperature check. Her home healthcould not help her with her wound today. She was seen yesterday by both emergency and her oncologist. Wound with no overt signs of infection. Wound redressed and advise her to follow closely with oncology.  This is a shared visit with the attending physician who personally evaluated the patient and agrees with the care plan.   Evaluation does not show pathology that would require ongoing emergent intervention or  inpatient treatment. Pt is hemodynamically stable and mentating appropriately. Discussed findings and plan with patient/guardian, who agrees with care plan. All questions answered. Return precautions discussed and outpatient follow up given.    Final Clinical Impressions(s) / ED Diagnoses   Final diagnoses:  Visit for wound check    New Prescriptions New Prescriptions   No medications on file     Waynetta Pean 02/05/17 1652    Tegeler, Gwenyth Allegra, MD 02/06/17 [phone]

## 2017-02-05 NOTE — Discharge Instructions (Addendum)
Please follow with your oncologist and wound care specialist as soon as possible.  Do not hesitate to return to the emergency department for any new, worsening or concerning symptoms.

## 2017-02-05 NOTE — Telephone Encounter (Signed)
Attempted to call pt x3 today without success. Spoke with [facility] and was not able to get a hold of pt either yesterday. Pt was well aware that she had an appt today. Reviewed her appts times/date yesterday. Pt confirmed that she had transportation for today. Will follow up with updates.

## 2017-02-05 NOTE — Assessment & Plan Note (Deleted)
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 She returnedback to see [place] in June.  Plan: 1. Palliative treatment with Taxol every 3 weeks started 11/18/2016 -------------------------------------------------------------------- Current treatment: Cycle 4 Taxol Chemotherapy toxicities:  Wound infection issues: Patient is currently getting wound care at home. But she continues to come to the emergency department as well as to our clinic for wound dressing changes.  Patient has a very high risk of infection. Patient was started on Flagyl yesterday from emergency room.  Severe pain related to fungating tumor: Yesterday we changed her treatment regimen to fentanyl 25 g patch along with Dilaudid 2 mg every 6 hours as needed.  Return to clinic in 3 weeks for cycle 5

## 2017-02-05 NOTE — ED Triage Notes (Signed)
Pt seen here the 10th and the 13th for pain related to her wound to left breast related to tumor with known drainage/bleeding. Pt has wound dressed with wrap and ace wrap at triage. Pt states she came here again today for wound check. Pt was given new prescription for pain patches and pills. Pt missed her appointment today at oncology center and states "i had to come here first for another wound check." See provider notes from recent visits. No acute distress noted.

## 2017-02-09 ENCOUNTER — Telehealth: Payer: Self-pay | Admitting: Hematology and Oncology

## 2017-02-09 ENCOUNTER — Telehealth: Payer: Self-pay

## 2017-02-09 NOTE — Telephone Encounter (Signed)
Called pt and lvm with call back number to notify her of her chemo appt on Thursday 9/20 starting at [place] transportation arrangement and lvm with Leveda Anna (pt case worker for transportation). Notified Ailene Ravel, RN [facility]) nurse. Followed up with palliative care to see if they got a hold of pt and still currently pending.

## 2017-02-09 NOTE — Telephone Encounter (Signed)
Spoke with patient regarding her appts that were added per 9/17 sch msg

## 2017-02-10 ENCOUNTER — Emergency Department (HOSPITAL_COMMUNITY)
Admission: EM | Admit: 2017-02-10 | Discharge: 2017-02-10 | Disposition: A | Discharge status: Home / Self Care | Payer: Medicaid Other | Attending: Emergency Medicine | Admitting: Emergency Medicine

## 2017-02-10 ENCOUNTER — Encounter (HOSPITAL_COMMUNITY): Payer: Self-pay | Admitting: *Deleted

## 2017-02-10 ENCOUNTER — Telehealth: Payer: Self-pay

## 2017-02-10 DIAGNOSIS — N644 Mastodynia: Secondary | ICD-10-CM | POA: Insufficient documentation

## 2017-02-10 DIAGNOSIS — C50412 Malignant neoplasm of upper-outer quadrant of left female breast: Secondary | ICD-10-CM | POA: Insufficient documentation

## 2017-02-10 DIAGNOSIS — R222 Localized swelling, mass and lump, trunk: Secondary | ICD-10-CM | POA: Insufficient documentation

## 2017-02-10 DIAGNOSIS — C7800 Secondary malignant neoplasm of unspecified lung: Secondary | ICD-10-CM | POA: Diagnosis not present

## 2017-02-10 DIAGNOSIS — R0789 Other chest pain: Secondary | ICD-10-CM | POA: Diagnosis present

## 2017-02-10 DIAGNOSIS — Z79899 Other long term (current) drug therapy: Secondary | ICD-10-CM | POA: Diagnosis not present

## 2017-02-10 DIAGNOSIS — Z5189 Encounter for other specified aftercare: Secondary | ICD-10-CM | POA: Insufficient documentation

## 2017-02-10 MED ORDER — HYDROMORPHONE HCL 1 MG/ML IJ SOLN
1.0000 mg | Freq: Once | INTRAMUSCULAR | Status: DC
  Filled 2017-02-10: qty 1

## 2017-02-10 MED ORDER — HYDROMORPHONE HCL 1 MG/ML IJ SOLN
1.0000 mg | Freq: Once | INTRAMUSCULAR | Status: AC
  Administered 2017-02-10: 1 mg via INTRAMUSCULAR

## 2017-02-10 NOTE — Telephone Encounter (Signed)
Pt called in to report that she had met with the palliative care nurse yesterday. Pt states that the fentanyl patch and dilaudid is not helping with her pain management at home. Told pt that this Rn will contact the palliative nurse to get updates on what was discussed in yesterday's appt.  Reminded pt of rescheduled chemo appt tomorrow and to be here at [facility] pt that transportation was set up for tomorrow and that she will need to answer her phone when they attempt to call her. Pt verbalized understanding and would like to request a different kind of pain medication for her breast pain. Pt confirmed that [facility] RN had been coming a few times a week to change her picc line dressing and her wound dressing.  Pt confirmed that she will be coming in tomorrow at 1 as long as Dr.Gudena could give her something different to manage her pain. Told pt that she can discuss this issue with MD tomorrow. No further questions or concerns.

## 2017-02-10 NOTE — ED Provider Notes (Signed)
[facility] DEPT Provider Note   CSN: [id number] Arrival date & time: 02/10/17  1353     History   Chief Complaint Chief Complaint  Patient presents with  . Wound Infection    HPI Ann Oliver is a 61 y.o. female.  HPI  61 y.o. female with a hx of Cancer, presents to the Emergency Department today due to left sided chest pain with wound noted for several months. Pt seen on 01-24-17, 02-01-17, 02-03-17, 02-04-17, 02-05-17 for same. Pt has home health nurse manageng wound. Pt called EMS today due to pain. Pt seen by Palliative Care and is taking Dilaudid and fentanyl patch with minimal relief. Pt has appointment with physician tomorrow as well as chemotherapy. Chart review shows that pt follows [name]. Hx breast cancer. Receving palliative Taxol every 3 weeks. Known large left fungating breast mass. No change in pain status since before. No new symptoms. No CP/SOB/ABD pain. No n/V. No diaphoresis. No fevers. No numbness/tingling. No headaches. No other symptoms noted.    Past Medical History:  Diagnosis Date  . Cancer ([place])   . Sinusitis   . Tumor cells     Patient Active Problem List   Diagnosis Date Noted  . Blood loss anemia 01/18/2017  . Port catheter in place 12/02/2016  . Necrotizing soft tissue infection 11/21/2016  . Cellulitis 10/28/2016  . Wound infection 10/28/2016  . Cellulitis of left breast 10/28/2016  . Encounter for palliative care   . Goals of care, counseling/discussion   . Breast cancer metastasized to lung, right ([place]) 09/25/2016  . Malnutrition of moderate degree 09/25/2016  . Breast cancer of upper-outer quadrant of left female breast ([place]) 10/22/2015  . THYROID NODULE, RIGHT 05/31/2007  . DEPRESSION 05/31/2007  . GERD 05/31/2007  . HOT FLASHES 05/31/2007  . HEADACHE 05/31/2007    Past Surgical History:  Procedure Laterality Date  . BIOPSY BREAST    . IR FLUORO GUIDE CV LINE RIGHT  11/06/2016  . IR US GUIDE VASC ACCESS RIGHT  11/06/2016     OB History    No data available       Home Medications    Prior to Admission medications   Medication Sig Start Date End Date Taking? Authorizing Provider  diphenoxylate-atropine (LOMOTIL) 2.5-0.025 MG tablet Take 1 tablet by mouth 4 (four) times daily as needed for diarrhea or loose stools. Patient not taking: Reported on 01/25/2017 01/11/17   Gardenia Phlegm, NP  feeding supplement, ENSURE ENLIVE, (ENSURE ENLIVE) LIQD Take 237 mLs by mouth 3 (three) times daily between meals. 09/26/16   [name], MD  fentaNYL (DURAGESIC - DOSED MCG/HR) 25 MCG/HR patch Place 1 patch (25 mcg total) onto the skin every 3 (three) days. 02/04/17   Nicholas Lose, MD  HYDROmorphone (DILAUDID) 2 MG tablet Take 1 tablet (2 mg total) by mouth every 4 (four) hours as needed for severe pain. 02/04/17   Nicholas Lose, MD  loperamide (IMODIUM) 2 MG capsule Take 1 capsule (2 mg total) by mouth as needed for diarrhea or loose stools. Take as directed on package 01/06/17   Magrinat, Virgie Dad, MD  metroNIDAZOLE (FLAGYL) 500 MG tablet Take 1 tablet by mouth 3 (three) times daily. Started 09/10 for 14 days 02/01/17   [provider]  ondansetron (ZOFRAN) 8 MG tablet Take 1 tablet (8 mg total) by mouth every 8 (eight) hours as needed for nausea or vomiting. 02/04/17   Nicholas Lose, MD    Family History Family History  Problem Relation Age of Onset  . Breast cancer Neg Hx     Social History Social History  Substance Use Topics  . Smoking status: Never Smoker  . Smokeless tobacco: Never Used  . Alcohol use No     Allergies   Aspirin   Review of Systems Review of Systems ROS reviewed and all are negative for acute change except as noted in the HPI.  Physical Exam Updated Vital Signs BP 95/78 (BP Location: Left Arm)   Pulse 98   Temp 98.7 F (37.1 C) (Oral)   Resp 14   SpO2 96%   Physical Exam  Constitutional: She is oriented to person, place, and time. She appears well-developed  and well-nourished. No distress.  HENT:  Head: Normocephalic and atraumatic.  Right Ear: Tympanic membrane, external ear and ear canal normal.  Left Ear: Tympanic membrane, external ear and ear canal normal.  Nose: Nose normal.  Mouth/Throat: Uvula is midline, oropharynx is clear and moist and mucous membranes are normal. No trismus in the jaw. No oropharyngeal exudate, posterior oropharyngeal erythema or tonsillar abscesses.  Eyes: Pupils are equal, round, and reactive to light. EOM are normal.  Neck: Normal range of motion. Neck supple. No tracheal deviation present.  Cardiovascular: Normal rate, regular rhythm, S1 normal, S2 normal, normal heart sounds, intact distal pulses and normal pulses.   Pulmonary/Chest: Effort normal and breath sounds normal. No respiratory distress. She has no decreased breath sounds. She has no wheezes. She has no rhonchi. She has no rales.  See image below. Left fungating breast mass. Foul odor. Mild bleeding. No erythema. Wound dehiscence noted   Abdominal: Normal appearance and bowel sounds are normal. There is no tenderness.  Musculoskeletal: Normal range of motion.  Neurological: She is alert and oriented to person, place, and time.  Skin: Skin is warm and dry.  Psychiatric: She has a normal mood and affect. Her speech is normal and behavior is normal. Thought content normal.       ED Treatments / Results  Labs (all labs ordered are listed, but only abnormal results are displayed) Labs Reviewed - No data to display  EKG  EKG Interpretation None       Radiology No results found.  Procedures Procedures (including critical care time)  Medications Ordered in ED Medications - No data to display   Initial Impression / Assessment and Plan / ED Course  I have reviewed the triage vital signs and the nursing notes.  Pertinent labs & imaging results that were available during my care of the patient were reviewed by me and considered in my medical  decision making (see chart for details).  Final Clinical Impressions(s) / ED Diagnoses  {I have reviewed and evaluated the relevant laboratory values. {I have reviewed and evaluated the relevant imaging studies.  {I have reviewed the relevant previous healthcare records. {I have reviewed EMS Documentation. {I obtained HPI from historian. {Patient discussed with supervising physician.  ED Course:  Assessment: Pt is a 61 y.o. female with a hx of Cancer, presents to the Emergency Department today due to left sided chest pain with wound noted for several months. Pt seen on 01-24-17, 02-01-17, 02-03-17, 02-04-17, 02-05-17 for same. Pt has home health nurse manageng wound. Pt called EMS today due to pain. Pt seen by Palliative Care and is taking Dilaudid and fentanyl patch with minimal relief. Pt has appointment with physician tomorrow as well as chemotherapy. Chart review shows that pt follows [name]. Hx breast cancer. Receving  palliative Taxol every 3 weeks. Known large left fungating breast mass. No change in pain status since before. No new symptoms. No CP/SOB/ABD pain. No n/V. No diaphoresis. No fevers. No numbness/tingling. No headaches. On exam, pt in NAD. Nontoxic/nonseptic appearing. VSS. Afebrile. Lungs CTA. Heart RRR. Abdomen nontender soft. Left fungating breast mass. Foul odor. Mild bleeding. No erythema. Wound dehiscence noted. No overt signs of infection. Given 1 dose dilaudid to allow for wound check. Dressing changed in ED. Pt followed closely by Oncology as well as home health nursing staff tomorrow at appointment. Plan is to DC home with follow up to PCP tomorrow. At time of discharge, Patient is in no acute distress. Vital Signs are stable. Patient is able to ambulate. Patient able to tolerate PO.   Disposition/Plan:  DC Home Additional Verbal discharge instructions given and discussed with patient.  Pt Instructed to f/u with PCP in the next week for evaluation and treatment of  symptoms. Return precautions given Pt acknowledges and agrees with plan  Supervising Physician Orlie Dakin, MD  Final diagnoses:  Breast pain  Visit for wound check    New Prescriptions New Prescriptions   No medications on file     Conni Slipper 02/10/17 2212    Orlie Dakin, MD 02/10/17 2354

## 2017-02-10 NOTE — ED Triage Notes (Signed)
Pt arrived  By [facility] from home. Has would to left side of chest that she is being treated for. Is also currently getting chemo. Was seen on 9/14 for wound and notes report no signs of infection. At triage, dressing is soiled and foul odor noted. VSS. Pt reports doing the best she can with keeping it clean and dry. Pt called ems today due to pain. She is taking dilaudid and has fentanyl patch with no relief. Has appt tomorrow.

## 2017-02-10 NOTE — Discharge Instructions (Signed)
Please read and follow all provided instructions.  Your diagnoses today include:  1. Breast pain   2. Visit for wound check    Tests performed today include: Vital signs. See below for your results today.   Medications prescribed:  Take as prescribed   Home care instructions:  Follow any educational materials contained in this packet.  Follow-up instructions: Please follow-up with your primary care provider for further evaluation of symptoms and treatment   Return instructions:  Please return to the Emergency Department if you do not get better, if you get worse, or new symptoms OR  - Fever (temperature greater than 101.63F)  - Bleeding that does not stop with holding pressure to the area    -Severe pain (please note that you may be more sore the day after your accident)  - Chest Pain  - Difficulty breathing  - Severe nausea or vomiting  - Inability to tolerate food and liquids  - Passing out  - Skin becoming red around your wounds  - Change in mental status (confusion or lethargy)  - New numbness or weakness    Please return if you have any other emergent concerns.  Additional Information:  Your vital signs today were: BP (!) 115/46 (BP Location: Left Arm)    Pulse 94    Temp 98.7 F (37.1 C) (Oral)    Resp (!) 21    SpO2 98%  If your blood pressure (BP) was elevated above 135/85 this visit, please have this repeated by your doctor within one month. ---------------

## 2017-02-11 ENCOUNTER — Encounter: Payer: Self-pay | Admitting: Hematology and Oncology

## 2017-02-11 ENCOUNTER — Encounter: Payer: Self-pay | Admitting: General Practice

## 2017-02-11 ENCOUNTER — Ambulatory Visit (HOSPITAL_BASED_OUTPATIENT_CLINIC_OR_DEPARTMENT_OTHER): Payer: Medicaid Other

## 2017-02-11 ENCOUNTER — Other Ambulatory Visit: Payer: Self-pay | Admitting: Hematology and Oncology

## 2017-02-11 ENCOUNTER — Other Ambulatory Visit (HOSPITAL_BASED_OUTPATIENT_CLINIC_OR_DEPARTMENT_OTHER): Payer: Medicaid Other

## 2017-02-11 ENCOUNTER — Ambulatory Visit: Payer: Medicaid Other

## 2017-02-11 ENCOUNTER — Other Ambulatory Visit: Payer: Self-pay

## 2017-02-11 ENCOUNTER — Telehealth: Payer: Self-pay | Admitting: Hematology and Oncology

## 2017-02-11 ENCOUNTER — Ambulatory Visit (HOSPITAL_BASED_OUTPATIENT_CLINIC_OR_DEPARTMENT_OTHER): Payer: Medicaid Other | Admitting: Hematology and Oncology

## 2017-02-11 VITALS — BP 114/64 | HR 85 | Resp 18

## 2017-02-11 DIAGNOSIS — C50911 Malignant neoplasm of unspecified site of right female breast: Secondary | ICD-10-CM

## 2017-02-11 DIAGNOSIS — N644 Mastodynia: Secondary | ICD-10-CM

## 2017-02-11 DIAGNOSIS — Z95828 Presence of other vascular implants and grafts: Secondary | ICD-10-CM

## 2017-02-11 DIAGNOSIS — C773 Secondary and unspecified malignant neoplasm of axilla and upper limb lymph nodes: Secondary | ICD-10-CM

## 2017-02-11 DIAGNOSIS — Z170 Estrogen receptor positive status [ER+]: Secondary | ICD-10-CM

## 2017-02-11 DIAGNOSIS — C50412 Malignant neoplasm of upper-outer quadrant of left female breast: Secondary | ICD-10-CM

## 2017-02-11 DIAGNOSIS — C7800 Secondary malignant neoplasm of unspecified lung: Secondary | ICD-10-CM | POA: Diagnosis not present

## 2017-02-11 DIAGNOSIS — N611 Abscess of the breast and nipple: Secondary | ICD-10-CM

## 2017-02-11 DIAGNOSIS — Z5111 Encounter for antineoplastic chemotherapy: Secondary | ICD-10-CM | POA: Diagnosis not present

## 2017-02-11 LAB — COMPREHENSIVE METABOLIC PANEL
ALBUMIN: 2.7 g/dL — AB (ref 3.5–5.0)
ALK PHOS: 105 U/L (ref 40–150)
ALT: 19 U/L (ref 0–55)
AST: 33 U/L (ref 5–34)
Anion Gap: 8 mEq/L (ref 3–11)
BILIRUBIN TOTAL: 0.22 mg/dL (ref 0.20–1.20)
BUN: 13.8 mg/dL (ref 7.0–26.0)
CO2: 28 meq/L (ref 22–29)
CREATININE: 0.7 mg/dL (ref 0.6–1.1)
Calcium: 9.2 mg/dL (ref 8.4–10.4)
Chloride: 100 mEq/L (ref 98–109)
EGFR: >90 mL/min/{1.73_m2} (ref >90)
GLUCOSE: 102 mg/dL (ref 70–140)
Potassium: 4.1 mEq/L (ref 3.5–5.1)
SODIUM: 136 meq/L (ref 136–145)
Total Protein: 6.7 g/dL (ref 6.4–8.3)

## 2017-02-11 LAB — CBC WITH DIFFERENTIAL/PLATELET
BASO%: 0.1 % (ref 0.0–2.0)
Basophils Absolute: 0 10*3/uL (ref 0.0–0.1)
EOS ABS: 0.2 10*3/uL (ref 0.0–0.5)
EOS%: 0.9 % (ref 0.0–7.0)
HCT: 25.6 % — ABNORMAL LOW (ref 34.8–46.6)
HEMOGLOBIN: 8 g/dL — AB (ref 11.6–15.9)
LYMPH#: 0.8 10*3/uL — AB (ref 0.9–3.3)
LYMPH%: 4.5 % — ABNORMAL LOW (ref 14.0–49.7)
MCH: 25.5 pg (ref 25.1–34.0)
MCHC: 31.3 g/dL — ABNORMAL LOW (ref 31.5–36.0)
MCV: 81.5 fL (ref 79.5–101.0)
MONO#: 0.9 10*3/uL (ref 0.1–0.9)
MONO%: 5 % (ref 0.0–14.0)
NEUT%: 89.5 % — ABNORMAL HIGH (ref 38.4–76.8)
NEUTROS ABS: 15.4 10*3/uL — AB (ref 1.5–6.5)
NRBC: 0 % (ref 0–0)
Platelets: 403 10*3/uL — ABNORMAL HIGH (ref 145–400)
RBC: 3.14 10*6/uL — ABNORMAL LOW (ref 3.70–5.45)
RDW: 17.1 % — AB (ref 11.2–14.5)
WBC: 17.2 10*3/uL — AB (ref 3.9–10.3)

## 2017-02-11 MED ORDER — OXYCODONE-ACETAMINOPHEN 5-325 MG PO TABS: 1.0000 | ORAL_TABLET | Freq: Once | ORAL | Status: DC

## 2017-02-11 MED ORDER — SODIUM CHLORIDE 0.9% FLUSH
10.0000 mL | INTRAVENOUS | Status: DC | PRN
Start: 2017-02-11 — End: 2017-02-11
  Administered 2017-02-11: 10 mL
  Filled 2017-02-11: qty 10

## 2017-02-11 MED ORDER — GABAPENTIN 300 MG PO CAPS: 300.0000 mg | ORAL_CAPSULE | Freq: Every day | ORAL | 3 refills | Status: DC

## 2017-02-11 MED ORDER — FAMOTIDINE IN NACL 20-0.9 MG/50ML-% IV SOLN
20.0000 mg | Freq: Once | INTRAVENOUS | Status: AC
Start: 2017-02-11 — End: 2017-02-11
  Administered 2017-02-11: 20 mg via INTRAVENOUS

## 2017-02-11 MED ORDER — SODIUM CHLORIDE 0.9% FLUSH
10.0000 mL | Freq: Once | INTRAVENOUS | Status: AC
  Administered 2017-02-11: 10 mL
  Filled 2017-02-11: qty 10

## 2017-02-11 MED ORDER — SODIUM CHLORIDE 0.9 % IV SOLN
Freq: Once | INTRAVENOUS | Status: AC
  Administered 2017-02-11: 14:00:00 via INTRAVENOUS

## 2017-02-11 MED ORDER — OXYCODONE-ACETAMINOPHEN 5-325 MG PO TABS
ORAL_TABLET | ORAL | Status: AC
  Filled 2017-02-11: qty 2

## 2017-02-11 MED ORDER — FAMOTIDINE IN NACL 20-0.9 MG/50ML-% IV SOLN
INTRAVENOUS | Status: AC
  Filled 2017-02-11: qty 50

## 2017-02-11 MED ORDER — HEPARIN SOD (PORK) LOCK FLUSH 100 UNIT/ML IV SOLN
250.0000 [IU] | Freq: Once | INTRAVENOUS | Status: AC | PRN
  Administered 2017-02-11: 250 [IU]
  Filled 2017-02-11: qty 5

## 2017-02-11 MED ORDER — DIPHENHYDRAMINE HCL 50 MG/ML IJ SOLN
INTRAMUSCULAR | Status: AC
  Filled 2017-02-11: qty 1

## 2017-02-11 MED ORDER — PACLITAXEL CHEMO INJECTION 300 MG/50ML
175.0000 mg/m2 | Freq: Once | INTRAVENOUS | Status: AC
  Administered 2017-02-11: 270 mg via INTRAVENOUS
  Filled 2017-02-11: qty 45

## 2017-02-11 MED ORDER — DEXAMETHASONE SODIUM PHOSPHATE 10 MG/ML IJ SOLN
10.0000 mg | Freq: Once | INTRAMUSCULAR | Status: AC
  Administered 2017-02-11: 10 mg via INTRAVENOUS

## 2017-02-11 MED ORDER — DIPHENHYDRAMINE HCL 50 MG/ML IJ SOLN
50.0000 mg | Freq: Once | INTRAMUSCULAR | Status: AC
  Administered 2017-02-11: 50 mg via INTRAVENOUS

## 2017-02-11 MED ORDER — OXYCODONE-ACETAMINOPHEN 5-325 MG PO TABS
1.0000 | ORAL_TABLET | Freq: Once | ORAL | Status: AC
  Administered 2017-02-11: 2 via ORAL

## 2017-02-11 MED ORDER — DEXAMETHASONE SODIUM PHOSPHATE 10 MG/ML IJ SOLN
INTRAMUSCULAR | Status: AC
  Filled 2017-02-11: qty 1

## 2017-02-11 NOTE — Telephone Encounter (Signed)
No 9/20 los.  

## 2017-02-11 NOTE — Assessment & Plan Note (Signed)
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 She returnedback to see [place] in June.  Plan: 1. Palliative treatment with Taxol every 3 weeks started 11/18/2016 -------------------------------------------------------------------- Current treatment: Cycle 4 Taxol Chemotherapy toxicities: Denies any nausea vomiting or any other side effects from chemotherapy. Denies neuropathy.  Wound infection issues: Patient is currently getting wound care at home. But she continues to come to the emergency department as well as to our clinic for wound dressing changes.  Patient has a very high risk of infection. Patient was started on Flagyl yesterday from emergency room.  Severe pain related to fungating tumor: Yesterday we changed her treatment regimen to fentanyl 25 g patch along with Dilaudid 2 mg every 6 hours as needed.  Return to clinic in 3 weeks for cycle 5

## 2017-02-11 NOTE — Progress Notes (Signed)
Per Dr.Gudena, pt may have 1-2 tabs of percocet during infusion today. Orders placed in MAR. Notified infusion RN and is aware.

## 2017-02-11 NOTE — Progress Notes (Signed)
[place] Note  Checked in with Dakisha in infusion per referral from [place] [facility]/MDiv and in consultation with May Armel/RN.  [place] had noticed that [place] usually wears a new color of paper scrubs to her treatments.  In consultation with Mairany, provided her with lightweight pants and sweater, tshirt, and new pair of underwear from [facility] clothes closet.  Shannyn is aware of ongoing Support Team availability, but please page as needs arise or circumstances change.  Thank you.   [employer], [place], [facility] Pager [phone] Voicemail [phone]

## 2017-02-11 NOTE — Patient Instructions (Signed)
[facility] Discharge Instructions for Patients Receiving Chemotherapy  Today you received the following chemotherapy agents Taxol   To help prevent nausea and vomiting after your treatment, we encourage you to take your nausea medication as directed.   If you develop nausea and vomiting that is not controlled by your nausea medication, call the clinic.   BELOW ARE SYMPTOMS THAT SHOULD BE REPORTED IMMEDIATELY:  *FEVER GREATER THAN 100.5 F  *CHILLS WITH OR WITHOUT FEVER  NAUSEA AND VOMITING THAT IS NOT CONTROLLED WITH YOUR NAUSEA MEDICATION  *UNUSUAL SHORTNESS OF BREATH  *UNUSUAL BRUISING OR BLEEDING  TENDERNESS IN MOUTH AND THROAT WITH OR WITHOUT PRESENCE OF ULCERS  *URINARY PROBLEMS  *BOWEL PROBLEMS  UNUSUAL RASH Items with * indicate a potential emergency and should be followed up as soon as possible.  Feel free to call the clinic you have any questions or concerns. The clinic phone number is [phone].  Please show the CHEMO ALERT CARD at check-in to the Emergency Department and triage nurse.   

## 2017-02-11 NOTE — Progress Notes (Signed)
Patient Care Team: [facility] as PCP - General  DIAGNOSIS:  Encounter Diagnosis  Name Primary?  . Malignant neoplasm of upper-outer quadrant of left breast in female, estrogen receptor positive ([place])     SUMMARY OF ONCOLOGIC HISTORY:   Breast cancer of upper-outer quadrant of left female breast ([place])   09/26/2015 Mammogram    Left breast 2:00 position suspicious mass 3.1 x 1.8 x 2.8 cm, indeterminate group of calcifications UIQ left breast needs stereotactic biopsy      10/22/2015 Initial Diagnosis    Left breast biopsy UOQ: Invasive adenocarcinoma, grade 2-3, EF 20%, PR 0%, Ki-67 30%, left breast UIQ: Fibroadenoma with extensive calcifications       Miscellaneous    Patient decided not to get any interventions and was lost to follow-up      05/06/2016 Imaging    CT chest: Heterogeneous necrotic mass left chest wall and UOQ 6.2 cm extending into axilla, ext into pectoralis major and to skin multiple enlarged left axillary LN, multiple more than 15 lung nodules, 1.5 cm lesion right lobe of the liver      09/25/2016 - 09/27/2016 Hospital Admission    Breast abscess (fungating tumor) 12.5 cm with axillary LN mets, mets to lungs      11/18/2016 -  Chemotherapy    Taxol every 3 weeks palliative chemotherapy       CHIEF COMPLIANT: Cycle 4 of Taxol  INTERVAL HISTORY: Ann Oliver is a 61 year old with above-mentioned history metastatic breast cancer to fungating tumor of the breast who is here for cycle 4 of Taxol. She has missed chemotherapy last week. We had requested palliative care to see the patient. She is currently on Dilaudid and apparently taking twice the dose appeared prescribed. We had prescribed fentanyl patch but she has not filled the prescription. She was responding to chemotherapy as her tumor was getting smaller.  REVIEW OF SYSTEMS:   Constitutional: Denies fevers, chills or abnormal weight loss Eyes: Denies blurriness of vision Ears,  nose, mouth, throat, and face: Denies mucositis or sore throat Respiratory: Denies cough, dyspnea or wheezes Cardiovascular: Denies palpitation, chest discomfort Gastrointestinal:  Denies nausea, heartburn or change in bowel habits Skin: Denies abnormal skin rashes Lymphatics: Denies new lymphadenopathy or easy bruising Neurological:Denies numbness, tingling or new weaknesses Behavioral/Psych: Mood is stable, no new changes  Extremities: No lower extremity edema Breast: Fungating tumor of the breast with  metastatic disease All other systems were reviewed with the patient and are negative.  I have reviewed the past medical history, past surgical history, social history and family history with the patient and they are unchanged from previous note.  ALLERGIES:  is allergic to aspirin.  MEDICATIONS:  Current Outpatient Prescriptions  Medication Sig Dispense Refill  . diphenoxylate-atropine (LOMOTIL) 2.5-0.025 MG tablet Take 1 tablet by mouth 4 (four) times daily as needed for diarrhea or loose stools. (Patient not taking: Reported on 01/25/2017) 30 tablet 0  . feeding supplement, ENSURE ENLIVE, (ENSURE ENLIVE) LIQD Take 237 mLs by mouth 3 (three) times daily between meals. 30 Bottle 0  . fentaNYL (DURAGESIC - DOSED MCG/HR) 25 MCG/HR patch Place 1 patch (25 mcg total) onto the skin every 3 (three) days. 5 patch 0  . HYDROmorphone (DILAUDID) 2 MG tablet Take 1 tablet (2 mg total) by mouth every 4 (four) hours as needed for severe pain. 120 tablet 0  . loperamide (IMODIUM) 2 MG capsule Take 1 capsule (2 mg total) by mouth as needed for diarrhea  or loose stools. Take as directed on package 30 capsule 0  . metroNIDAZOLE (FLAGYL) 500 MG tablet Take 1 tablet by mouth 3 (three) times daily. Started 09/10 for 14 days  0  . ondansetron (ZOFRAN) 8 MG tablet Take 1 tablet (8 mg total) by mouth every 8 (eight) hours as needed for nausea or vomiting. 20 tablet 0   No current facility-administered medications  for this visit.    Facility-Administered Medications Ordered in Other Visits  Medication Dose Route Frequency Provider Last Rate Last Dose  . alteplase (CATHFLO ACTIVASE) injection 2 mg  2 mg Intracatheter Once Nicholas Lose, MD      . sodium chloride flush (NS) 0.9 % injection 10 mL  10 mL Intracatheter PRN Nicholas Lose, MD   10 mL at 12/25/16 1702    PHYSICAL EXAMINATION: ECOG PERFORMANCE STATUS: 1 - Symptomatic but completely ambulatory  There were no vitals filed for this visit. There were no vitals filed for this visit.  GENERAL:alert, no distress and comfortable SKIN: skin color, texture, turgor are normal, no rashes or significant lesions EYES: normal, Conjunctiva are pink and non-injected, sclera clear OROPHARYNX:no exudate, no erythema and lips, buccal mucosa, and tongue normal  NECK: supple, thyroid normal size, non-tender, without nodularity LYMPH:  no palpable lymphadenopathy in the cervical, axillary or inguinal LUNGS: clear to auscultation and percussion with normal breathing effort HEART: regular rate & rhythm and no murmurs and no lower extremity edema ABDOMEN:abdomen soft, non-tender and normal bowel sounds MUSCULOSKELETAL:no cyanosis of digits and no clubbing  NEURO: alert & oriented x 3 with fluent speech, no focal motor/sensory deficits EXTREMITIES: No lower extremity edema  LABORATORY DATA:  I have reviewed the data as listed   Chemistry      Component Value Date/Time   NA 137 02/04/2017 0758   NA 134 (L) 01/29/2017 1442   K 4.0 02/04/2017 0758   K 3.9 01/29/2017 1442   CL 101 02/04/2017 0758   CO2 27 02/04/2017 0758   CO2 25 01/29/2017 1442   BUN 14 02/04/2017 0758   BUN 20.2 01/29/2017 1442   CREATININE 0.75 02/04/2017 0758   CREATININE 0.8 01/29/2017 1442      Component Value Date/Time   CALCIUM 9.2 02/04/2017 0758   CALCIUM 9.7 01/29/2017 1442   ALKPHOS 191 (H) 01/29/2017 1442   AST 30 01/29/2017 1442   ALT 32 01/29/2017 1442   BILITOT <0.22  01/29/2017 1442       Lab Results  Component Value Date   WBC 17.2 (H) 02/11/2017   HGB 8.0 (L) 02/11/2017   HCT 25.6 (L) 02/11/2017   MCV 81.5 02/11/2017   PLT 403 (H) 02/11/2017   NEUTROABS 15.4 (H) 02/11/2017    ASSESSMENT & PLAN:  Breast cancer of upper-outer quadrant of left female breast ([place]) 09/26/2015 Left breast 2:00 position suspicious mass 3.1 x 1.8 x 2.8 cm, indeterminate group of calcifications UIQ left breast needs stereotactic biopsy Left breast biopsy UOQ 10/22/2015: Invasive adenocarcinoma, grade 2-3, ER 20%, PR 0%, Ki-67 30%, left breast UIQ: Fibroadenoma with extensive calcifications Clinical stage in May 2017: T2 N0 stage II a Clinical stage 05/08/2016: Stage IV  Hospitalization 5/5-5/7: Breast abscess Patient was lost to follow up Refused treatment previously opting for homeopathic treatments. She returnedback to see [place] in June.  Plan: 1. Palliative treatment with Taxol every 3 weeks started 11/18/2016 -------------------------------------------------------------------- Current treatment: Cycle 4 Taxol Chemotherapy toxicities: Denies any nausea vomiting or any other side effects from chemotherapy. Denies neuropathy.  Wound infection issues: Patient is currently getting wound care at home. But she continues to come to the emergency department as well as to our clinic for wound dressing changes.  Patient has a very high risk of infection. Patient was started on Flagyl  from emergency room.  Severe pain related to fungating tumor: Yesterday we changed her treatment regimen to fentanyl 25 g patch along with Dilaudid 2 mg every 6 hours as needed.Palliative care saw the patient and recommended gabapentin. I'm sending a prescription for gabapentin 300 mg to be taken at bedtime. I instructed the patient that she cannot take twice the dose of the pain medicine that he had prescribed. She does feel that it is not the pain that is bothering her but it's  achiness in the muscles that is bothering her the most.  Return to clinic in 3 weeks for cycle 5    I spent 25 minutes talking to the patient of which more than half was spent in counseling and coordination of care.  No orders of the defined types were placed in this encounter.  The patient has a good understanding of the overall plan. she agrees with it. she will call with any problems that may develop before the next visit here.   Rulon Eisenmenger, MD 02/11/17

## 2017-02-15 ENCOUNTER — Telehealth: Payer: Self-pay

## 2017-02-15 NOTE — Telephone Encounter (Signed)
Called pt and lvm with call back number.  

## 2017-02-15 NOTE — Telephone Encounter (Signed)
Pt states that her dilaudid and fentanyl are not working. She is stating the gabapentin is not helping and wants to go up on the dosage. She stated when she took 2 percocet at treatment on 9/20 it helped her pain. She is asking for an rx for percocet.   She is asking for a call.

## 2017-02-16 ENCOUNTER — Other Ambulatory Visit: Payer: Self-pay

## 2017-02-16 ENCOUNTER — Telehealth: Payer: Self-pay | Admitting: Hematology and Oncology

## 2017-02-16 MED ORDER — GABAPENTIN 300 MG PO CAPS: 300.0000 mg | ORAL_CAPSULE | Freq: Three times a day (TID) | ORAL | 3 refills | Status: DC

## 2017-02-16 MED ORDER — OXYCODONE-ACETAMINOPHEN 5-325 MG PO TABS: 2.0000 | ORAL_TABLET | ORAL | 0 refills | Status: DC | PRN

## 2017-02-16 NOTE — Telephone Encounter (Signed)
Left voicemail for patient regarding the changes in her appts per 9/25 sch msg.

## 2017-02-16 NOTE — Progress Notes (Addendum)
Pt in the lobby requesting to be placed back on percocet, since dilaudid was not helping pt. Pt refused to fill the script for fentanyl patch. Pt is afraid of overdosing on pain meds. Discussed with Dr.Gudena about pt concern and that pt feels 2 percocet per every 4 hrs controls her breast pain much better. Obtained percocet order and discontinue fentanyl and dilaudid. Also, increased gabapentin to 300TID per MD orders. Changed and sent script to pt pharmacy.   Pt forgot to bring her dilaudid bottle in to waste. Told pt to make sure she gives her dilaudid bottle to [facility] when she fills her percocet today. Contacted [facility] RN) to make sure pt have returned unused pills [facility]. Pt requesting for a bus pass for today. Contacted [facility] and spoke with Vernie Shanks to obtain some bus passes for pt. Explained to pt the importance of taking the right dose of her narcotics. No further refills will be given if pt uses up all the medication prior to next refill. Pt verbalized understanding and has no further questions at this time.

## 2017-02-17 ENCOUNTER — Telehealth: Payer: Self-pay

## 2017-02-17 NOTE — Telephone Encounter (Signed)
Heather from [employer] [facility] called to inquire more about pt condition and plan of care. Gave Heather information and will try to reach pt for an appt to offer their services.

## 2017-02-19 ENCOUNTER — Telehealth: Payer: Self-pay

## 2017-02-19 NOTE — Telephone Encounter (Signed)
Spoke with Hospice NP, Sheryl regarding [place]'s new pain medication regiment. She stated she will make another home visit next week to see how Simmie is doing on this change.  Cyndia Bent RN

## 2017-02-26 ENCOUNTER — Ambulatory Visit: Payer: Self-pay | Admitting: Hematology and Oncology

## 2017-02-26 ENCOUNTER — Ambulatory Visit: Payer: Self-pay

## 2017-02-26 ENCOUNTER — Other Ambulatory Visit: Payer: Self-pay

## 2017-03-02 ENCOUNTER — Encounter (HOSPITAL_BASED_OUTPATIENT_CLINIC_OR_DEPARTMENT_OTHER): Payer: Medicaid Other | Attending: Surgery

## 2017-03-02 DIAGNOSIS — D0502 Lobular carcinoma in situ of left breast: Secondary | ICD-10-CM | POA: Insufficient documentation

## 2017-03-02 DIAGNOSIS — Z9221 Personal history of antineoplastic chemotherapy: Secondary | ICD-10-CM | POA: Diagnosis not present

## 2017-03-02 DIAGNOSIS — L03313 Cellulitis of chest wall: Secondary | ICD-10-CM | POA: Diagnosis present

## 2017-03-03 ENCOUNTER — Other Ambulatory Visit: Payer: Self-pay | Admitting: Hematology and Oncology

## 2017-03-04 ENCOUNTER — Telehealth: Payer: Self-pay | Admitting: *Deleted

## 2017-03-04 NOTE — Telephone Encounter (Signed)
Determined that pt is with palliative care & not hospice.  Message left for pt to p/u her script for pain at office since unable to fax to pharmacy.

## 2017-03-04 NOTE — Telephone Encounter (Signed)
Received vm call from pt asking about medication & ride.  Returned call & pt reports that she doesn't have a ride for tomorrow's appt.  Message left for [employer] regarding appt times for tomorrow & req. Call back.  Found script for oxycodone & had Dr [name] to sign & faxed to [place] informed pt that this was done.

## 2017-03-05 ENCOUNTER — Telehealth: Payer: Self-pay

## 2017-03-05 ENCOUNTER — Ambulatory Visit (HOSPITAL_BASED_OUTPATIENT_CLINIC_OR_DEPARTMENT_OTHER): Payer: Medicaid Other | Admitting: Adult Health

## 2017-03-05 ENCOUNTER — Telehealth: Payer: Self-pay | Admitting: Adult Health

## 2017-03-05 ENCOUNTER — Other Ambulatory Visit: Payer: Self-pay

## 2017-03-05 ENCOUNTER — Ambulatory Visit: Payer: Self-pay

## 2017-03-05 ENCOUNTER — Ambulatory Visit: Payer: Medicaid Other

## 2017-03-05 ENCOUNTER — Telehealth: Payer: Self-pay | Admitting: *Deleted

## 2017-03-05 ENCOUNTER — Encounter: Payer: Self-pay | Admitting: Adult Health

## 2017-03-05 DIAGNOSIS — R110 Nausea: Secondary | ICD-10-CM | POA: Diagnosis not present

## 2017-03-05 DIAGNOSIS — N611 Abscess of the breast and nipple: Secondary | ICD-10-CM

## 2017-03-05 DIAGNOSIS — C7800 Secondary malignant neoplasm of unspecified lung: Secondary | ICD-10-CM | POA: Diagnosis not present

## 2017-03-05 DIAGNOSIS — C50412 Malignant neoplasm of upper-outer quadrant of left female breast: Secondary | ICD-10-CM

## 2017-03-05 DIAGNOSIS — N644 Mastodynia: Secondary | ICD-10-CM | POA: Diagnosis not present

## 2017-03-05 DIAGNOSIS — K644 Residual hemorrhoidal skin tags: Secondary | ICD-10-CM | POA: Diagnosis not present

## 2017-03-05 DIAGNOSIS — R197 Diarrhea, unspecified: Secondary | ICD-10-CM | POA: Diagnosis not present

## 2017-03-05 DIAGNOSIS — C773 Secondary and unspecified malignant neoplasm of axilla and upper limb lymph nodes: Secondary | ICD-10-CM | POA: Diagnosis not present

## 2017-03-05 MED ORDER — OXYCODONE-ACETAMINOPHEN 5-325 MG PO TABS
ORAL_TABLET | ORAL | Status: AC
  Filled 2017-03-05: qty 2

## 2017-03-05 MED ORDER — ONDANSETRON HCL 8 MG PO TABS: 8.0000 mg | ORAL_TABLET | Freq: Three times a day (TID) | ORAL | 0 refills | Status: DC | PRN

## 2017-03-05 MED ORDER — HYDROCORTISONE ACETATE 25 MG RE SUPP: 25.0000 mg | Freq: Two times a day (BID) | RECTAL | 0 refills | Status: DC | PRN

## 2017-03-05 MED ORDER — PRAMOXINE HCL 1 % RE FOAM: 1.0000 "application " | Freq: Three times a day (TID) | RECTAL | 0 refills | Status: DC | PRN

## 2017-03-05 MED ORDER — OXYCODONE-ACETAMINOPHEN 5-325 MG PO TABS: 2.0000 | ORAL_TABLET | Freq: Once | ORAL | Status: DC

## 2017-03-05 NOTE — Telephone Encounter (Signed)
Voicemail: "I'm on the bus.  Do you all close at 3:00 pm.  Can the prescriptions be left somewhere?  I also need something for nausea and diarrhea and PICC flushed."  Message left on patient's voicemail [facility] providers end at 5:00 pm and to expect to talk with a provider when she arrives today.  3:40 [id number] arrived.  Identified by this nurse who has met her in person previously.  Advised to bring picture ID for future prescription pick up.  [facility] nurse to lobby escorting patient to speak with APP.

## 2017-03-05 NOTE — Patient Instructions (Signed)
Hemorrhoids    Hemorrhoids are swollen veins in and around the rectum or anus. Hemorrhoids can cause pain, itching, or bleeding. Most of the time, they do not cause serious problems. They usually get better with diet changes, lifestyle changes, and other home treatments.  Follow these instructions at home:  Eating and drinking  · Eat foods that have fiber, such as whole grains, beans, nuts, fruits, and vegetables. Ask your doctor about taking products that have added fiber (fiber supplements).  · Drink enough fluid to keep your pee (urine) clear or pale yellow.  For Pain and Swelling  · Take a warm-water bath (sitz bath) for 20 minutes to ease pain. Do this 3-4 times a day.  · If directed, put ice on the painful area. It may be helpful to use ice between your warm baths.  ¨ Put ice in a plastic bag.  ¨ Place a towel between your skin and the bag.  ¨ Leave the ice on for 20 minutes, 2-3 times a day.  General instructions  · Take over-the-counter and prescription medicines only as told by your doctor.  ¨ Medicated creams and medicines that are inserted into the anus (suppositories) may be used or applied as told.  · Exercise often.  · Go to the bathroom when you have the urge to poop (to have a bowel movement). Do not wait.  · Avoid pushing too hard (straining) when you poop.  · Keep the butt area dry and clean. Use wet toilet paper or moist paper towels.  · Do not sit on the toilet for a long time.  Contact a doctor if:  · You have any of these:  ¨ Pain and swelling that do not get better with treatment or medicine.  ¨ Bleeding that will not stop.  ¨ Trouble pooping or you cannot poop.  ¨ Pain or swelling outside the area of the hemorrhoids.  This information is not intended to replace advice given to you by your health care provider. Make sure you discuss any questions you have with your health care provider.  Document Released: 02/18/2008 Document Revised: 10/17/2015 Document Reviewed: 01/23/2015  Elsevier  Interactive Patient Education © 2018 Elsevier Inc.   

## 2017-03-05 NOTE — Progress Notes (Signed)
[facility] Follow up:    Center, [facility] [address] [place] [address]   DIAGNOSIS: Cancer Staging No matching staging information was found for the patient.  SUMMARY OF ONCOLOGIC HISTORY:   Breast cancer of upper-outer quadrant of left female breast ([place])   09/26/2015 Mammogram    Left breast 2:00 position suspicious mass 3.1 x 1.8 x 2.8 cm, indeterminate group of calcifications UIQ left breast needs stereotactic biopsy      10/22/2015 Initial Diagnosis    Left breast biopsy UOQ: Invasive adenocarcinoma, grade 2-3, EF 20%, PR 0%, Ki-67 30%, left breast UIQ: Fibroadenoma with extensive calcifications       Miscellaneous    Patient decided not to get any interventions and was lost to follow-up      05/06/2016 Imaging    CT chest: Heterogeneous necrotic mass left chest wall and UOQ 6.2 cm extending into axilla, ext into pectoralis major and to skin multiple enlarged left axillary LN, multiple more than 15 lung nodules, 1.5 cm lesion right lobe of the liver      09/25/2016 - 09/27/2016 Hospital Admission    Breast abscess (fungating tumor) 12.5 cm with axillary LN mets, mets to lungs      11/18/2016 -  Chemotherapy    Taxol every 3 weeks palliative chemotherapy        CURRENT THERAPY: Palliative Taxol  INTERVAL HISTORY: Ann Oliver 61 y.o. female returns for evalation of her metastatic breast cancer.  She missed her appt this morning again due to lack of transportation.  When she was told that she had to pick up her pain medication prescription, it couldn't be called in, she found a way to get here late this afternoon.  She is having diarrhea and notes blood when she wipes.  She has loperamide and Lomotil, but she isn't taking it.  She says she doesn't need those.  She is requesting a refill of nausea medication.     Patient Active Problem List   Diagnosis Date Noted  . Blood loss anemia 01/18/2017  . Port catheter in  place 12/02/2016  . Necrotizing soft tissue infection 11/21/2016  . Cellulitis 10/28/2016  . Wound infection 10/28/2016  . Cellulitis of left breast 10/28/2016  . Encounter for palliative care   . Goals of care, counseling/discussion   . 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 Malnutrition of moderate degree 09/25/2016  . Breast cancer of upper-outer quadrant of left female breast ([place]) 10/22/2015  . THYROID NODULE, RIGHT 05/31/2007  . DEPRESSION 05/31/2007  . GERD 05/31/2007  . HOT FLASHES 05/31/2007  . HEADACHE 05/31/2007    is allergic to aspirin.  MEDICAL HISTORY: Past Medical History:  Diagnosis Date  . Cancer ([place])   . Sinusitis   . Tumor cells     SURGICAL HISTORY: Past Surgical History:  Procedure Laterality Date  . BIOPSY BREAST    . IR FLUORO GUIDE CV LINE RIGHT  11/06/2016  . IR US GUIDE VASC ACCESS RIGHT  11/06/2016    SOCIAL HISTORY: Social History   Social History  . Marital status: Single    Spouse name: N/A  . Number of children: N/A  . Years of education: N/A   Occupational History  . Not on file.   Social History Main Topics  . Smoking status: Never Smoker  . Smokeless tobacco: Never Used  . Alcohol use No  . Drug use: No  . Sexual activity: Not  on file   Other Topics Concern  . Not on file   Social History Narrative  . No narrative on file    FAMILY HISTORY: Family History  Problem Relation Age of Onset  . Breast cancer Neg Hx     Review of Systems  Constitutional: Negative for appetite change, chills, fatigue, fever and unexpected weight change.  HENT:   Negative for hearing loss and lump/mass.   Eyes: Negative for eye problems and icterus.  Respiratory: Negative for chest tightness, cough and shortness of breath.   Cardiovascular: Negative for chest pain, leg swelling and palpitations.  Gastrointestinal: Positive for nausea. Negative for abdominal distention, abdominal pain, constipation, diarrhea and  vomiting.  Endocrine: Negative for hot flashes.  Genitourinary: Negative for difficulty urinating.   Musculoskeletal: Negative for arthralgias.  Skin: Negative for itching and rash.  Neurological: Negative for dizziness, extremity weakness, headaches and numbness.  Hematological: Negative for adenopathy. Does not bruise/bleed easily.  Psychiatric/Behavioral: Negative for depression. The patient is not nervous/anxious.       PHYSICAL EXAMINATION  ECOG PERFORMANCE STATUS: 2 - Symptomatic, <50% confined to bed  There were no vitals filed for this visit.  Physical Exam  Constitutional: She is oriented to person, place, and time and well-developed, well-nourished, and in no distress.  HENT:  Head: Normocephalic and atraumatic.  Right Ear: External ear normal.  Neck: Neck supple.  Cardiovascular: Normal rate, regular rhythm and normal heart sounds.   Pulmonary/Chest: Effort normal and breath sounds normal.  Abdominal: Soft. Bowel sounds are normal.  Genitourinary:  Genitourinary Comments: 3-4 external hemorrhoids noted, about 1cm each  Lymphadenopathy:    She has no cervical adenopathy.  Neurological: She is alert and oriented to person, place, and time.  Skin: Skin is warm and dry.  Psychiatric: Mood and affect normal.    LABORATORY DATA:  CBC    Component Value Date/Time   WBC 17.2 (H) 02/11/2017 1300   WBC 14.9 (H) 02/04/2017 0758   RBC 3.14 (L) 02/11/2017 1300   RBC 3.40 (L) 02/04/2017 0758   HGB 8.0 (L) 02/11/2017 1300   HCT 25.6 (L) 02/11/2017 1300   PLT 403 (H) 02/11/2017 1300   MCV 81.5 02/11/2017 1300   MCH 25.5 02/11/2017 1300   MCH 25.3 (L) 02/04/2017 0758   MCHC 31.3 (L) 02/11/2017 1300   MCHC 32.0 02/04/2017 0758   RDW 17.1 (H) 02/11/2017 1300   LYMPHSABS 0.8 (L) 02/11/2017 1300   MONOABS 0.9 02/11/2017 1300   EOSABS 0.2 02/11/2017 1300   BASOSABS 0.0 02/11/2017 1300    CMP     Component Value Date/Time   NA 136 02/11/2017 1300   K 4.1 02/11/2017  1300   CL 101 02/04/2017 0758   CO2 28 02/11/2017 1300   GLUCOSE 102 02/11/2017 1300   BUN 13.8 02/11/2017 1300   CREATININE 0.7 02/11/2017 1300   CALCIUM 9.2 02/11/2017 1300   PROT 6.7 02/11/2017 1300   ALBUMIN 2.7 (L) 02/11/2017 1300   AST 33 02/11/2017 1300   ALT 19 02/11/2017 1300   ALKPHOS 105 02/11/2017 1300   BILITOT 0.22 02/11/2017 1300   GFRNONAA >60 02/04/2017 0758   GFRAA >60 02/04/2017 0758            ASSESSMENT and PLAN:   Breast cancer of upper-outer quadrant of left female breast ([place]) 09/26/2015 Left breast 2:00 position suspicious mass 3.1 x 1.8 x 2.8 cm, indeterminate group of calcifications UIQ left breast needs stereotactic biopsy Left breast biopsy UOQ 10/22/2015:  Invasive adenocarcinoma, grade 2-3, ER 20%, PR 0%, Ki-67 30%, left breast UIQ: Fibroadenoma with extensive calcifications Clinical stage in May 2017: T2 N0 stage II a Clinical stage 05/08/2016: Stage IV  Hospitalization 5/5-5/7: Breast abscess Patient was lost to follow up Refused treatment previously opting for homeopathic treatments. She returnedback to see [place] in June.  Plan: 1. Palliative treatment with Taxol every 3 weeks started 11/18/2016 -------------------------------------------------------------------- Current treatment: Cycle 5 Taxol Chemotherapy toxicities: Denies neuropathy.  She will return on Monday or Tuesday for Taxol.  She says we will need to provide transportation to return.    Wound infection issues: Patient is currently getting wound care at home. But she continues to come to the emergency department as well as to our clinic for wound dressing changes.  Patient is at very high risk of infection.  External hemorrhoids: I sent in a prescription for pramoxine cream along with anusol-hc suppository that she can use for her hemorrhoids.  I gave her a handout about hemorrhoids for her to read.  She assures me that she can read.    Nausea: I refilled Ondansetron for  her today.    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 on 10//10 that she picked up today.    Return to clinic early next week for cycle 5.  Unfortunately, due to the timing of today's visit, I cannot give her a date and time for this appointment and we will have to follow up on this early next week.     All questions were answered. The patient knows to call the clinic with any problems, questions or concerns. We can certainly see the patient much sooner if necessary.  A total of (30) minutes of face-to-face time was spent with this patient with greater than 50% of that time in counseling and care-coordination.  This note was electronically signed. Scot Dock, NP 03/08/2017

## 2017-03-05 NOTE — Assessment & Plan Note (Addendum)
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 She returnedback to see [place] in June.  Plan: 1. Palliative treatment with Taxol every 3 weeks started 11/18/2016 -------------------------------------------------------------------- Current treatment: Cycle 5 Taxol Chemotherapy toxicities: Denies neuropathy.  She will return on Monday or Tuesday for Taxol.  She says we will need to provide transportation to return.    Wound infection issues: Patient is currently getting wound care at home. But she continues to come to the emergency department as well as to our clinic for wound dressing changes.  Patient is at very high risk of infection.  External hemorrhoids: I sent in a prescription for pramoxine cream along with anusol-hc suppository that she can use for her hemorrhoids.  I gave her a handout about hemorrhoids for her to read.  She assures me that she can read.    Nausea: I refilled Ondansetron for her today.    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 on 10//10 that she picked up today.    Return to clinic early next week for cycle 5.  Unfortunately, due to the timing of today's visit, I cannot give her a date and time for this appointment and we will have to follow up on this early next week.

## 2017-03-05 NOTE — Telephone Encounter (Signed)
"  I've called [facility] for pain prescription and it has not been sent.  I'm in pain.  That's why I was not able to come in today." Informed patient the prescription is ready for pick up, cannot be faxed.   "Oh no, I do not have transportation.  I will work on transportation to get there as soon as I can.  Do not know when I can get there.  It may be 1:00 to 1:30.  If I cannot receive chemotherapy, I at least need my PICC dressing changed and flushed."   Routing call information to collaborative nurse and provider for review.  Further patient communication through collaborative nurse.

## 2017-03-05 NOTE — Telephone Encounter (Signed)
Gave patient AVs and calendar of upcoming October appointments.  °

## 2017-03-05 NOTE — Telephone Encounter (Signed)
Returning patient call.  Had to leave VM.  She was inquiring about picking up her prescription today after 3, and would any one be here. Told her she could come by today to pick up prescription and that per [facility] she needed to see about getting her chemo rescheduled.

## 2017-03-08 ENCOUNTER — Emergency Department (HOSPITAL_COMMUNITY)
Admission: EM | Admit: 2017-03-08 | Discharge: 2017-03-08 | Disposition: A | Discharge status: Home / Self Care | Payer: Medicaid Other | Attending: Emergency Medicine | Admitting: Emergency Medicine

## 2017-03-08 ENCOUNTER — Other Ambulatory Visit: Payer: Self-pay

## 2017-03-08 ENCOUNTER — Telehealth: Payer: Self-pay | Admitting: Hematology and Oncology

## 2017-03-08 ENCOUNTER — Encounter (HOSPITAL_COMMUNITY): Payer: Self-pay | Admitting: Emergency Medicine

## 2017-03-08 ENCOUNTER — Emergency Department (HOSPITAL_COMMUNITY): Payer: Medicaid Other

## 2017-03-08 DIAGNOSIS — C50412 Malignant neoplasm of upper-outer quadrant of left female breast: Secondary | ICD-10-CM | POA: Diagnosis not present

## 2017-03-08 DIAGNOSIS — C7801 Secondary malignant neoplasm of right lung: Secondary | ICD-10-CM | POA: Diagnosis not present

## 2017-03-08 DIAGNOSIS — R600 Localized edema: Secondary | ICD-10-CM | POA: Insufficient documentation

## 2017-03-08 DIAGNOSIS — R2243 Localized swelling, mass and lump, lower limb, bilateral: Secondary | ICD-10-CM | POA: Diagnosis present

## 2017-03-08 DIAGNOSIS — R609 Edema, unspecified: Secondary | ICD-10-CM

## 2017-03-08 LAB — COMPREHENSIVE METABOLIC PANEL
ALBUMIN: 2.7 g/dL — AB (ref 3.5–5.0)
ALK PHOS: 132 U/L — AB (ref 38–126)
ALT: 17 U/L (ref 14–54)
AST: 29 U/L (ref 15–41)
Anion gap: 9 (ref 5–15)
BUN: 15 mg/dL (ref 6–20)
CALCIUM: 9 mg/dL (ref 8.9–10.3)
CO2: 25 mmol/L (ref 22–32)
CREATININE: 0.7 mg/dL (ref 0.44–1.00)
Chloride: 102 mmol/L (ref 101–111)
GFR calc Af Amer: >60 mL/min (ref >60)
GFR calc non Af Amer: >60 mL/min (ref >60)
GLUCOSE: 128 mg/dL — AB (ref 65–99)
Potassium: 4.1 mmol/L (ref 3.5–5.1)
SODIUM: 136 mmol/L (ref 135–145)
Total Bilirubin: 0.4 mg/dL (ref 0.3–1.2)
Total Protein: 6.9 g/dL (ref 6.5–8.1)

## 2017-03-08 LAB — CBC WITH DIFFERENTIAL/PLATELET
BASOS PCT: 0 %
Basophils Absolute: 0 10*3/uL (ref 0.0–0.1)
EOS ABS: 0.1 10*3/uL (ref 0.0–0.7)
Eosinophils Relative: 0 %
HCT: 29.2 % — ABNORMAL LOW (ref 36.0–46.0)
HEMOGLOBIN: 9 g/dL — AB (ref 12.0–15.0)
LYMPHS ABS: 0.5 10*3/uL — AB (ref 0.7–4.0)
Lymphocytes Relative: 2 %
MCH: 24.7 pg — ABNORMAL LOW (ref 26.0–34.0)
MCHC: 30.8 g/dL (ref 30.0–36.0)
MCV: 80.2 fL (ref 78.0–100.0)
Monocytes Absolute: 1 10*3/uL (ref 0.1–1.0)
Monocytes Relative: 4 %
NEUTROS PCT: 94 %
Neutro Abs: 22.4 10*3/uL — ABNORMAL HIGH (ref 1.7–7.7)
Platelets: 563 10*3/uL — ABNORMAL HIGH (ref 150–400)
RBC: 3.64 MIL/uL — AB (ref 3.87–5.11)
RDW: 18.2 % — ABNORMAL HIGH (ref 11.5–15.5)
WBC: 24 10*3/uL — AB (ref 4.0–10.5)

## 2017-03-08 MED ORDER — FUROSEMIDE 20 MG PO TABS: 20.0000 mg | ORAL_TABLET | Freq: Every day | ORAL | 0 refills | Status: DC

## 2017-03-08 MED ORDER — FUROSEMIDE 10 MG/ML IJ SOLN
40.0000 mg | Freq: Once | INTRAMUSCULAR | Status: AC
  Administered 2017-03-08: 40 mg via INTRAVENOUS
  Filled 2017-03-08: qty 4

## 2017-03-08 MED ORDER — GABAPENTIN 300 MG PO CAPS
300.0000 mg | ORAL_CAPSULE | Freq: Once | ORAL | Status: AC
  Administered 2017-03-08: 300 mg via ORAL
  Filled 2017-03-08: qty 1

## 2017-03-08 NOTE — Telephone Encounter (Signed)
Left voicemail for patient regarding appts scheduled per 10/15 sch msg. Sending confirmation letter in the mail.

## 2017-03-08 NOTE — ED Triage Notes (Signed)
Per EMS pt here for leg swelling worsening over past few weeks. Pt complaint of chronic left side pain; sees wound care for same.

## 2017-03-08 NOTE — Discharge Instructions (Signed)
Stay on a low salt diet. Keep your feet elevated as much as possible.

## 2017-03-08 NOTE — Progress Notes (Signed)
Pt walked in the cancer center this morning around 1130am to request a picc line dressing change. Pt states that she had an appt today for lab/flush. Told pt her appt was not until 145 lab and flush 2pm. Pt states that she was in the ED waiting room and left because of her waiting time. Noticed that pt legs were +3-4 edema. She states that she has had this swelling for a week now and it is more difficult to walk and put on her shoes. Told pt that according to epic, she was under current admission in the ED [facility]. Told pt that she needs to get back to the ED to have her leg swelling checked. Pt chemo treatment rescheduled for Friday. Will call pt with update times/date to confirm. Wheeled pt back to [facility] ED and inquired about her waiting time and to see if she was still on their list. Confirmed with registration in ED that she was still pending to be called back. Told pt to stay until called back and she verbalized understanding.

## 2017-03-08 NOTE — ED Provider Notes (Signed)
[facility] DEPT Provider Note   CSN: [id number] Arrival date & time: 03/08/17  1001     History   Chief Complaint Chief Complaint  Patient presents with  . Leg Swelling  . Cancer    HPI Ann Oliver is a 61 y.o. female.  The history is provided by the patient.  She has a history of metastatic breast cancer with a chronic wound of the left breast. She complains of swelling in her legs intermittently over the last two weeks, getting worse. She is unable to put her shoes on because of the swelling. She denies pain in her legs, chest pain, dyspnea. She had been put on a low sodium diet in the hospital, but has been inconsistent in following it.  Past Medical History:  Diagnosis Date  . Cancer ([place])   . Sinusitis   . Tumor cells     Patient Active Problem List   Diagnosis Date Noted  . Blood loss anemia 01/18/2017  . Port catheter in place 12/02/2016  . Necrotizing soft tissue infection 11/21/2016  . Cellulitis 10/28/2016  . Wound infection 10/28/2016  . Cellulitis of left breast 10/28/2016  . Encounter for palliative care   . Goals of care, counseling/discussion   . Breast cancer metastasized to lung, right ([place]) 09/25/2016  . Malnutrition of moderate degree 09/25/2016  . Breast cancer of upper-outer quadrant of left female breast ([place]) 10/22/2015  . THYROID NODULE, RIGHT 05/31/2007  . DEPRESSION 05/31/2007  . GERD 05/31/2007  . HOT FLASHES 05/31/2007  . HEADACHE 05/31/2007    Past Surgical History:  Procedure Laterality Date  . BIOPSY BREAST    . IR FLUORO GUIDE CV LINE RIGHT  11/06/2016  . IR US GUIDE VASC ACCESS RIGHT  11/06/2016    OB History    No data available       Home Medications    Prior to Admission medications   Medication Sig Start Date End Date Taking? Authorizing Provider  feeding supplement, ENSURE ENLIVE, (ENSURE ENLIVE) LIQD Take 237 mLs by mouth 3 (three) times daily between meals. 09/26/16  Yes [name], MD  gabapentin (NEURONTIN) 300 MG capsule Take 1 capsule (300 mg total) by mouth 3 (three) times daily. 02/16/17  Yes Nicholas Lose, MD  ondansetron (ZOFRAN) 8 MG tablet Take 1 tablet (8 mg total) by mouth every 8 (eight) hours as needed for nausea or vomiting. 03/05/17  Yes Causey, Charlestine Massed, NP  oxyCODONE-acetaminophen (PERCOCET/ROXICET) 5-325 MG tablet take 2 tablets by mouth every 4 hours if needed for severe pain 03/03/17  Yes Nicholas Lose, MD  diphenoxylate-atropine (LOMOTIL) 2.5-0.025 MG tablet Take 1 tablet by mouth 4 (four) times daily as needed for diarrhea or loose stools. Patient not taking: Reported on 01/25/2017 01/11/17   Gardenia Phlegm, NP  hydrocortisone (ANUSOL-HC) 25 MG suppository Place 1 suppository (25 mg total) rectally 2 (two) times daily as needed for hemorrhoids or anal itching. 03/05/17   Gardenia Phlegm, NP  loperamide (IMODIUM) 2 MG capsule Take 1 capsule (2 mg total) by mouth as needed for diarrhea or loose stools. Take as directed on package 01/06/17   Magrinat, Virgie Dad, MD  pramoxine (PROCTOFOAM) 1 % foam Place 1 application rectally 3 (three) times daily as needed for anal itching. 03/05/17   Gardenia Phlegm, NP    Family History Family History  Problem Relation Age of Onset  . Breast cancer Neg Hx     Social History Social History  Substance Use Topics  . Smoking status: Never Smoker  . Smokeless tobacco: Never Used  . Alcohol use No     Allergies   Aspirin   Review of Systems Review of Systems  All other systems reviewed and are negative.    Physical Exam Updated Vital Signs BP (!) 147/116 (BP Location: Left Arm)   Pulse (!) 119   Temp 98.4 F (36.9 C) (Oral)   Resp 20   Ht 5\' 1"  (1.549 m)   Wt 54.4 kg (120 lb)   SpO2 95%   BMI 22.67 kg/m   Physical Exam  Nursing note and vitals reviewed.  61 year old female, resting comfortably and in no acute distress. Vital signs are significant for  hypertension and tachycardia. Oxygen saturation is 95%, which is normal. Head is normocephalic and atraumatic. PERRLA, EOMI. Oropharynx is clear. Neck is nontender and supple without adenopathy or JVD. Back is nontender and there is no CVA tenderness. Lungs are clear without rales, wheezes, or rhonchi. Chest: Dressing is in place over the left breast, and is not removed. Heart has regular rate and rhythm without murmur. Abdomen is soft, flat, nontender without masses or hepatosplenomegaly and peristalsis is normoactive. Extremities have 2-3+ edema, full range of motion is present. No erythema, warmth, tenderness or cord. Skin is warm and dry without rash. Neurologic: Mental status is normal, cranial nerves are intact, there are no motor or sensory deficits.  ED Treatments / Results  Labs (all labs ordered are listed, but only abnormal results are displayed) Labs Reviewed  CBC WITH DIFFERENTIAL/PLATELET - Abnormal; Notable for the following:       Result Value   WBC 24.0 (*)    RBC 3.64 (*)    Hemoglobin 9.0 (*)    HCT 29.2 (*)    MCH 24.7 (*)    RDW 18.2 (*)    Platelets 563 (*)    Neutro Abs 22.4 (*)    Lymphs Abs 0.5 (*)    All other components within normal limits  COMPREHENSIVE METABOLIC PANEL - Abnormal; Notable for the following:    Glucose, Bld 128 (*)    Albumin 2.7 (*)    Alkaline Phosphatase 132 (*)    All other components within normal limits  BRAIN NATRIURETIC PEPTIDE   Radiology Dg Chest 2 View  Result Date: 03/08/2017 CLINICAL DATA:  Leg swelling, increasing for 1 week. EXAM: CHEST  2 VIEW COMPARISON:  11/04/2016 FINDINGS: A new right PICC terminates over the lower SVC. The cardiomediastinal silhouette is within normal limits. Aortic atherosclerosis is noted. Multiple pulmonary nodules are again noted bilaterally, with some of the nodules less well seen than on the prior study suggesting an interval decrease in size. The lungs are slightly less well inflated than  on the prior study with minimal opacity in the left lung base, likely atelectasis. No lobar consolidation, edema, pleural effusion, or pneumothorax is identified. Large left breast/chest wall mass is partially visualized. No acute osseous abnormality is seen. IMPRESSION: 1. Mild hypoinflation with minimal left basilar atelectasis. 2. Decreased size of pulmonary metastases. Electronically Signed   By: Logan Bores M.D.   On: 03/08/2017 16:57    Procedures Procedures (including critical care time)  Medications Ordered in ED Medications  gabapentin (NEURONTIN) capsule 300 mg (not administered)  furosemide (LASIX) injection 40 mg (not administered)     Initial Impression / Assessment and Plan / ED Course  I have reviewed the triage vital signs and the nursing notes.  Pertinent  labs & imaging results that were available during my care of the patient were reviewed by me and considered in my medical decision making (see chart for details).  Peripheral edema in patient with metastatic breast cancer. No physical findings to suggest DVT. Old records are reviewed, and I see no mention of peripheral edema, including hospitalization 01/18/2017, and office visit 03/05/2017. Her chemotherapy is palliative, and she is also being managed for chronic pain. Prior renal function has been normal. Will check renal function, albumin, chest x-ray. Will give dose of furosemide. I anticipate she will need to be on furosemide at home.  Chest x-ray shows no cardiomegaly or pulmonary vascular congestion or pleural effusion. Function is normal. She is discharged with prescription for furosemide.  Final Clinical Impressions(s) / ED Diagnoses   Final diagnoses:  Peripheral edema    New Prescriptions New Prescriptions   FUROSEMIDE (LASIX) 20 MG TABLET    Take 1 tablet (20 mg total) by mouth daily.     Delora Fuel, MD 15/52/08 [phone]

## 2017-03-08 NOTE — ED Notes (Signed)
Spoke with Long, MD regarding pt status and complaint; verbal orders placed while pt waiting for available room.

## 2017-03-09 ENCOUNTER — Telehealth: Payer: Self-pay

## 2017-03-09 ENCOUNTER — Other Ambulatory Visit: Payer: Self-pay

## 2017-03-09 ENCOUNTER — Emergency Department (HOSPITAL_COMMUNITY)
Admission: EM | Admit: 2017-03-09 | Discharge: 2017-03-09 | Disposition: A | Discharge status: Home / Self Care | Payer: Medicaid Other | Attending: Emergency Medicine | Admitting: Emergency Medicine

## 2017-03-09 ENCOUNTER — Encounter (HOSPITAL_COMMUNITY): Payer: Self-pay

## 2017-03-09 DIAGNOSIS — Z5189 Encounter for other specified aftercare: Secondary | ICD-10-CM | POA: Insufficient documentation

## 2017-03-09 DIAGNOSIS — R600 Localized edema: Secondary | ICD-10-CM | POA: Diagnosis not present

## 2017-03-09 DIAGNOSIS — R609 Edema, unspecified: Secondary | ICD-10-CM

## 2017-03-09 DIAGNOSIS — Y939 Activity, unspecified: Secondary | ICD-10-CM | POA: Insufficient documentation

## 2017-03-09 DIAGNOSIS — X58XXXA Exposure to other specified factors, initial encounter: Secondary | ICD-10-CM | POA: Insufficient documentation

## 2017-03-09 DIAGNOSIS — C7801 Secondary malignant neoplasm of right lung: Secondary | ICD-10-CM | POA: Insufficient documentation

## 2017-03-09 DIAGNOSIS — Y999 Unspecified external cause status: Secondary | ICD-10-CM | POA: Insufficient documentation

## 2017-03-09 DIAGNOSIS — S21002A Unspecified open wound of left breast, initial encounter: Secondary | ICD-10-CM | POA: Diagnosis not present

## 2017-03-09 DIAGNOSIS — Z853 Personal history of malignant neoplasm of breast: Secondary | ICD-10-CM | POA: Diagnosis not present

## 2017-03-09 DIAGNOSIS — Y929 Unspecified place or not applicable: Secondary | ICD-10-CM | POA: Diagnosis not present

## 2017-03-09 DIAGNOSIS — Z79899 Other long term (current) drug therapy: Secondary | ICD-10-CM | POA: Diagnosis not present

## 2017-03-09 MED ORDER — LOPERAMIDE HCL 2 MG PO CAPS: 2.0000 mg | ORAL_CAPSULE | ORAL | 0 refills | Status: DC | PRN

## 2017-03-09 MED ORDER — GABAPENTIN 300 MG PO CAPS
300.0000 mg | ORAL_CAPSULE | Freq: Once | ORAL | Status: AC
  Administered 2017-03-09: 300 mg via ORAL
  Filled 2017-03-09: qty 1

## 2017-03-09 MED ORDER — FUROSEMIDE 20 MG PO TABS
20.0000 mg | ORAL_TABLET | Freq: Once | ORAL | Status: AC
  Administered 2017-03-09: 20 mg via ORAL
  Filled 2017-03-09: qty 1

## 2017-03-09 NOTE — Discharge Instructions (Signed)
Pick up the prescription for the fluid pills. Follow up with your primary care doctor for refill as needed.  Continue to follow up with your oncologist regarding your cancer.  Continue to take your medications as prescribed. Contact your home health nurse or the wound care clinic if you need management of your lesion.  Return to the ER if you develop worsening bleeding, chest pain, difficulty breathing, or any new or worsening symptoms.

## 2017-03-09 NOTE — ED Provider Notes (Signed)
[facility] EMERGENCY DEPARTMENT Provider Note   CSN: [id number] Arrival date & time: 03/09/17  1443     History   Chief Complaint No chief complaint on file.   HPI Ann Oliver is a 61 y.o. female presenting for wound check and BLE swelling.   Pt with a h/o breast CA and chronic L sided mass. Pt presenting today with request for dressing change and due to BLE swelling. Pt was evaluated yesterday at [facility] for the same, and she was given lasix and a lasix rx, but she has not picked up the rx.  She has been seen multiple times for the same. She has wound care at-home, and been counseled by ED, PCP, and oncology staff about inappropriate use of the ED. She denies fevers, chill, CP, or SOB. She reports decreasing bleeding of the mass since several wks ago. BLE swelling has been present x2 wks. She denies pain of her calves, unilateral pain, or dyspnea.   HPI  Past Medical History:  Diagnosis Date  . Cancer ([place])   . Sinusitis   . Tumor cells     Patient Active Problem List   Diagnosis Date Noted  . Blood loss anemia 01/18/2017  . Port catheter in place 12/02/2016  . Necrotizing soft tissue infection 11/21/2016  . Cellulitis 10/28/2016  . Wound infection 10/28/2016  . Cellulitis of left breast 10/28/2016  . Encounter for palliative care   . Goals of care, counseling/discussion   . Breast cancer metastasized to lung, right ([place]) 09/25/2016  . Malnutrition of moderate degree 09/25/2016  . Breast cancer of upper-outer quadrant of left female breast ([place]) 10/22/2015  . THYROID NODULE, RIGHT 05/31/2007  . DEPRESSION 05/31/2007  . GERD 05/31/2007  . HOT FLASHES 05/31/2007  . HEADACHE 05/31/2007    Past Surgical History:  Procedure Laterality Date  . BIOPSY BREAST    . IR FLUORO GUIDE CV LINE RIGHT  11/06/2016  . IR US GUIDE VASC ACCESS RIGHT  11/06/2016    OB History    No data available       Home Medications    Prior to Admission medications     Medication Sig Start Date End Date Taking? Authorizing Provider  feeding supplement, ENSURE ENLIVE, (ENSURE ENLIVE) LIQD Take 237 mLs by mouth 3 (three) times daily between meals. 09/26/16  Yes [name], MD  furosemide (LASIX) 20 MG tablet Take 1 tablet (20 mg total) by mouth daily. 69/67/89  Yes Delora Fuel, MD  gabapentin (NEURONTIN) 300 MG capsule Take 1 capsule (300 mg total) by mouth 3 (three) times daily. 02/16/17  Yes Nicholas Lose, MD  hydrocortisone (ANUSOL-HC) 25 MG suppository Place 1 suppository (25 mg total) rectally 2 (two) times daily as needed for hemorrhoids or anal itching. 03/05/17  Yes Causey, Charlestine Massed, NP  loperamide (IMODIUM) 2 MG capsule Take 1 capsule (2 mg total) by mouth as needed for diarrhea or loose stools. Take as directed on package 03/09/17  Yes Nicholas Lose, MD  ondansetron (ZOFRAN) 8 MG tablet Take 1 tablet (8 mg total) by mouth every 8 (eight) hours as needed for nausea or vomiting. 03/05/17  Yes Causey, Charlestine Massed, NP  oxyCODONE-acetaminophen (PERCOCET/ROXICET) 5-325 MG tablet take 2 tablets by mouth every 4 hours if needed for severe pain 03/03/17  Yes Nicholas Lose, MD  pramoxine (PROCTOFOAM) 1 % foam Place 1 application rectally 3 (three) times daily as needed for anal itching. Patient not taking: Reported on 03/09/2017 03/05/17   Delice Bison,  Charlestine Massed, NP    Family History Family History  Problem Relation Age of Onset  . Breast cancer Neg Hx     Social History Social History  Substance Use Topics  . Smoking status: Never Smoker  . Smokeless tobacco: Never Used  . Alcohol use No     Allergies   Aspirin   Review of Systems Review of Systems  Cardiovascular: Positive for leg swelling.  Skin: Positive for wound.  Allergic/Immunologic: Positive for immunocompromised state.  All other systems reviewed and are negative.    Physical Exam Updated Vital Signs BP (!) 102/58   Pulse 86   Temp 98.3 F (36.8 C) (Oral)    Resp 16   SpO2 100%   Physical Exam  Constitutional: She is oriented to person, place, and time. She appears well-developed and well-nourished. No distress.  HENT:  Head: Normocephalic and atraumatic.  Eyes: EOM are normal.  Neck: Normal range of motion.  Cardiovascular: Normal rate, regular rhythm and intact distal pulses.   Pulmonary/Chest: Effort normal and breath sounds normal. No respiratory distress. She has no wheezes.  Abdominal: She exhibits no distension.  Musculoskeletal: Normal range of motion. She exhibits edema (BLE 3+ pitting edema).  Neurological: She is alert and oriented to person, place, and time.  Skin: Skin is warm. No rash noted.  ~7 in mass of L thorax with foul odor. Minimal blood noted to superior aspect, no obvious active bleeding. Per chart review, no significant changes.   Psychiatric: She has a normal mood and affect.  Nursing note and vitals reviewed.    ED Treatments / Results  Labs (all labs ordered are listed, but only abnormal results are displayed) Labs Reviewed - No data to display  EKG  EKG Interpretation None       Radiology Dg Chest 2 View  Result Date: 03/08/2017 CLINICAL DATA:  Leg swelling, increasing for 1 week. EXAM: CHEST  2 VIEW COMPARISON:  11/04/2016 FINDINGS: A new right PICC terminates over the lower SVC. The cardiomediastinal silhouette is within normal limits. Aortic atherosclerosis is noted. Multiple pulmonary nodules are again noted bilaterally, with some of the nodules less well seen than on the prior study suggesting an interval decrease in size. The lungs are slightly less well inflated than on the prior study with minimal opacity in the left lung base, likely atelectasis. No lobar consolidation, edema, pleural effusion, or pneumothorax is identified. Large left breast/chest wall mass is partially visualized. No acute osseous abnormality is seen. IMPRESSION: 1. Mild hypoinflation with minimal left basilar atelectasis. 2.  Decreased size of pulmonary metastases. Electronically Signed   By: Logan Bores M.D.   On: 03/08/2017 16:57    Procedures Procedures (including critical care time)  Medications Ordered in ED Medications  furosemide (LASIX) tablet 20 mg (20 mg Oral Given 03/09/17 1752)  gabapentin (NEURONTIN) capsule 300 mg (300 mg Oral Given 03/09/17 1752)     Initial Impression / Assessment and Plan / ED Course  I have reviewed the triage vital signs and the nursing notes.  Pertinent labs & imaging results that were available during my care of the patient were reviewed by me and considered in my medical decision making (see chart for details).     Patient presenting for dressing change and bilateral leg swelling. Patient seen at [facility] for the same yesterday. Has not picked up her prescription for Lasix. Physical exam shows bilateral pitting edema and unchanged mass of left thorax. As patient had workup for lower  leg edema yesterday, and has not been taking medicines as prescribed, doubt PE, CHF, or other emergent cause for leg swelling. Case discussed with attending, Dr. Alvino Chapel agrees to plan. Will give at-home medication, lasix, and change dressing. Patient to be discharged with follow-up as scheduled with oncology. Patient appears safe for discharge. Return precautions given. Patient states she understands and agrees to plan.    Final Clinical Impressions(s) / ED Diagnoses   Final diagnoses:  Encounter for wound care  Peripheral edema    New Prescriptions Discharge Medication List as of 03/09/2017  7:43 PM       Franchot Heidelberg, PA-C 03/10/17 [id number]    Davonna Belling, MD 03/17/17 [phone]

## 2017-03-09 NOTE — ED Triage Notes (Addendum)
Patient here requesting wound check that has been ongoing to left side/breast. Reports some bleeding from same with no change in pain, arrived with dressing to same. Being seen at wound clinic. NAD. Patient has PICC to right arm. Also request that we provide her daily meds

## 2017-03-09 NOTE — Telephone Encounter (Signed)
Called pt and lvm with call back number to remind her of her rescheduled chemo appt on 03/15/17 Monday, starting at 11am with labs/fl (picc dressing), md and infusion. Called transportation set up for pt that day. Notified [place] nurse, Meredith,RN and is aware.

## 2017-03-10 ENCOUNTER — Telehealth: Payer: Self-pay

## 2017-03-10 NOTE — Telephone Encounter (Signed)
Attempted to return call x2 but no answer. LVM with callback number.

## 2017-03-15 ENCOUNTER — Ambulatory Visit (HOSPITAL_BASED_OUTPATIENT_CLINIC_OR_DEPARTMENT_OTHER): Payer: Medicaid Other | Admitting: Hematology and Oncology

## 2017-03-15 ENCOUNTER — Ambulatory Visit: Payer: Self-pay | Admitting: Hematology and Oncology

## 2017-03-15 ENCOUNTER — Other Ambulatory Visit (HOSPITAL_BASED_OUTPATIENT_CLINIC_OR_DEPARTMENT_OTHER): Payer: Medicaid Other

## 2017-03-15 ENCOUNTER — Telehealth: Payer: Self-pay | Admitting: Hematology and Oncology

## 2017-03-15 ENCOUNTER — Ambulatory Visit (HOSPITAL_BASED_OUTPATIENT_CLINIC_OR_DEPARTMENT_OTHER): Payer: Medicaid Other

## 2017-03-15 ENCOUNTER — Encounter (HOSPITAL_COMMUNITY): Payer: Self-pay

## 2017-03-15 ENCOUNTER — Ambulatory Visit: Payer: Medicaid Other

## 2017-03-15 ENCOUNTER — Emergency Department (HOSPITAL_COMMUNITY)
Admission: EM | Admit: 2017-03-15 | Discharge: 2017-03-15 | Disposition: A | Discharge status: Home / Self Care | Payer: Medicaid Other | Attending: Emergency Medicine | Admitting: Emergency Medicine

## 2017-03-15 DIAGNOSIS — Z95828 Presence of other vascular implants and grafts: Secondary | ICD-10-CM

## 2017-03-15 DIAGNOSIS — C773 Secondary and unspecified malignant neoplasm of axilla and upper limb lymph nodes: Secondary | ICD-10-CM

## 2017-03-15 DIAGNOSIS — C50412 Malignant neoplasm of upper-outer quadrant of left female breast: Secondary | ICD-10-CM

## 2017-03-15 DIAGNOSIS — Z170 Estrogen receptor positive status [ER+]: Secondary | ICD-10-CM | POA: Diagnosis not present

## 2017-03-15 DIAGNOSIS — G893 Neoplasm related pain (acute) (chronic): Secondary | ICD-10-CM | POA: Diagnosis not present

## 2017-03-15 DIAGNOSIS — Z5189 Encounter for other specified aftercare: Secondary | ICD-10-CM

## 2017-03-15 DIAGNOSIS — Z4801 Encounter for change or removal of surgical wound dressing: Secondary | ICD-10-CM | POA: Diagnosis not present

## 2017-03-15 DIAGNOSIS — C7800 Secondary malignant neoplasm of unspecified lung: Secondary | ICD-10-CM | POA: Diagnosis not present

## 2017-03-15 DIAGNOSIS — C50911 Malignant neoplasm of unspecified site of right female breast: Secondary | ICD-10-CM

## 2017-03-15 LAB — COMPREHENSIVE METABOLIC PANEL
ALT: 16 U/L (ref 0–55)
AST: 31 U/L (ref 5–34)
Albumin: 2.3 g/dL — ABNORMAL LOW (ref 3.5–5.0)
Alkaline Phosphatase: 165 U/L — ABNORMAL HIGH (ref 40–150)
Anion Gap: 10 mEq/L (ref 3–11)
BUN: 15 mg/dL (ref 7.0–26.0)
CALCIUM: 8.7 mg/dL (ref 8.4–10.4)
CHLORIDE: 103 meq/L (ref 98–109)
CO2: 24 meq/L (ref 22–29)
Creatinine: 0.7 mg/dL (ref 0.6–1.1)
EGFR: >60 mL/min/{1.73_m2} (ref >60)
Glucose: 110 mg/dl (ref 70–140)
POTASSIUM: 3.9 meq/L (ref 3.5–5.1)
SODIUM: 137 meq/L (ref 136–145)
Total Bilirubin: <0.22 mg/dL (ref 0.20–1.20)
Total Protein: 6.4 g/dL (ref 6.4–8.3)

## 2017-03-15 LAB — CBC WITH DIFFERENTIAL/PLATELET
BASO%: 0.3 % (ref 0.0–2.0)
BASOS ABS: 0 10*3/uL (ref 0.0–0.1)
EOS%: 1.5 % (ref 0.0–7.0)
Eosinophils Absolute: 0.2 10*3/uL (ref 0.0–0.5)
HEMATOCRIT: 26 % — AB (ref 34.8–46.6)
HGB: 8.1 g/dL — ABNORMAL LOW (ref 11.6–15.9)
LYMPH%: 5.2 % — AB (ref 14.0–49.7)
MCH: 23.9 pg — AB (ref 25.1–34.0)
MCHC: 31 g/dL — ABNORMAL LOW (ref 31.5–36.0)
MCV: 76.9 fL — ABNORMAL LOW (ref 79.5–101.0)
MONO#: 0.9 10*3/uL (ref 0.1–0.9)
MONO%: 6.1 % (ref 0.0–14.0)
NEUT#: 12.4 10*3/uL — ABNORMAL HIGH (ref 1.5–6.5)
NEUT%: 86.9 % — AB (ref 38.4–76.8)
Platelets: 601 10*3/uL — ABNORMAL HIGH (ref 145–400)
RBC: 3.38 10*6/uL — AB (ref 3.70–5.45)
RDW: 19.1 % — ABNORMAL HIGH (ref 11.2–14.5)
WBC: 14.3 10*3/uL — ABNORMAL HIGH (ref 3.9–10.3)
lymph#: 0.7 10*3/uL — ABNORMAL LOW (ref 0.9–3.3)

## 2017-03-15 MED ORDER — OXYCODONE-ACETAMINOPHEN 5-325 MG PO TABS
2.0000 | ORAL_TABLET | Freq: Once | ORAL | Status: AC
  Administered 2017-03-15: 2 via ORAL
  Filled 2017-03-15: qty 2

## 2017-03-15 MED ORDER — HEPARIN SOD (PORK) LOCK FLUSH 100 UNIT/ML IV SOLN
250.0000 [IU] | Freq: Once | INTRAVENOUS | Status: AC
  Administered 2017-03-15: 500 [IU]
  Filled 2017-03-15: qty 5

## 2017-03-15 MED ORDER — SODIUM CHLORIDE 0.9% FLUSH
10.0000 mL | Freq: Once | INTRAVENOUS | Status: AC
  Administered 2017-03-15: 10 mL
  Filled 2017-03-15: qty 10

## 2017-03-15 NOTE — Assessment & Plan Note (Signed)
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 She returnedback to see [place] in June.  Plan: 1. Palliative treatment with Taxol every 3 weeks started 11/18/2016 -------------------------------------------------------------------- Current treatment: Cycle 5Taxol Chemotherapy toxicities: Denies neuropathy.  Wound infection issues: Patient is currently getting wound care at home. But she continues to come to the emergency department as well as to [facility] for wound dressing changes.  Patient is at very high risk of infection.  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 on 10//10 that she picked up today.    Return to clinic in 3 weeks for cycle 6

## 2017-03-15 NOTE — Progress Notes (Signed)
Per Dr.Gudena, pt to receive dressing changes in the ED as we are not able to do dressing changes here at the cancer center due to lack of proper supply to stop bleeding on her wound. Pt is being seen by home care for home dressing change 2x per week and wound care clinic once a month. Pt states that she requires every other day dressing changes. Pt was late for her chemo treatment today and will be rescheduled tomorrow.   Contacted transportation ([place]) for tomorrow. Told pt that she needs to answer her phone when it rings as it is important calls, that needs confirmation for her appts and care. Pt verbalized understanding. Reached out to [facility] social worker to see if there are additional resources we can provide for pt.   Pt is currently being followed by palliative care as well for pain management at home. They do not do dressing changes and will need to refer pt back with home care at this time. Notified home care RN taking care of pt so she is aware of pt appointments at the cancer center, and that she is going to have her dressing changed in the ED today. Dr.Gudena advised pt to keep in close communication with all services/resources provided for pt , so she doesn't have to keep going to the ED for dressing changes in the future. Pt verbalized understanding and will try to make sure she communicates better.

## 2017-03-15 NOTE — Progress Notes (Signed)
Have made multiple attempts to contact social services today. LVM x2 on Charlotte's voicemail (case Freight forwarder) without any call back. Waited 15 minutes + wait on call. Pt has appt tomorrow at 11am for rescheduled chemo.

## 2017-03-15 NOTE — Telephone Encounter (Signed)
Per desk nurse patient not able to have tx today - reschedule for tomorrow - okayed by charge nurse. Added tx for 10/23 - patient given printout by desk nurse.

## 2017-03-15 NOTE — Telephone Encounter (Signed)
No 10/22 los  

## 2017-03-15 NOTE — Progress Notes (Signed)
Patient Care Team: [facility] as PCP - General Nicholas Lose, MD as Consulting Physician (Hematology and Oncology) Delice Bison, Charlestine Massed, NP as Nurse Practitioner (Hematology and Oncology)  DIAGNOSIS:  Encounter Diagnosis  Name Primary?  . Malignant neoplasm of upper-outer quadrant of left breast in female, estrogen receptor positive ([place])     SUMMARY OF ONCOLOGIC HISTORY:   Breast cancer of upper-outer quadrant of left female breast ([place])   09/26/2015 Mammogram    Left breast 2:00 position suspicious mass 3.1 x 1.8 x 2.8 cm, indeterminate group of calcifications UIQ left breast needs stereotactic biopsy      10/22/2015 Initial Diagnosis    Left breast biopsy UOQ: Invasive adenocarcinoma, grade 2-3, EF 20%, PR 0%, Ki-67 30%, left breast UIQ: Fibroadenoma with extensive calcifications       Miscellaneous    Patient decided not to get any interventions and was lost to follow-up      05/06/2016 Imaging    CT chest: Heterogeneous necrotic mass left chest wall and UOQ 6.2 cm extending into axilla, ext into pectoralis major and to skin multiple enlarged left axillary LN, multiple more than 15 lung nodules, 1.5 cm lesion right lobe of the liver      09/25/2016 - 09/27/2016 Hospital Admission    Breast abscess (fungating tumor) 12.5 cm with axillary LN mets, mets to lungs      11/18/2016 -  Chemotherapy    Taxol every 3 weeks palliative chemotherapy        CHIEF COMPLIANT: cycle 5 Taxol  INTERVAL HISTORY: Ann Oliver is a testicular with above-mentioned history of fungating left breast tumor who is currently on palliative chemotherapy with Taxol. Today is cycle 5 of treatment. She is tolerating chemotherapy extremely well. Previous reports have noted that the tumor in the left breast has shrunken size.Her biggest issue is related to wound care dressings.It appears that she does not respond to her phone calls and because of his home health has not been  able to do dressing changes. She is here today for cycle 6 of treatment subsequently she'll be going to the emergency room for dressing changes. Our nurses have set up everything for her home health visits but she does not answer her phone: This is led to difficulties in getting her wound dressing change done.  REVIEW OF SYSTEMS:   Constitutional: Denies fevers, chills or abnormal weight loss Eyes: Denies blurriness of vision Ears, nose, mouth, throat, and face: Denies mucositis or sore throat Respiratory: Denies cough, dyspnea or wheezes Cardiovascular: Denies palpitation, chest discomfort Gastrointestinal:  Denies nausea, heartburn or change in bowel habits Skin: Denies abnormal skin rashes Lymphatics: Denies new lymphadenopathy or easy bruising Neurological:Denies numbness, tingling or new weaknesses Behavioral/Psych: Mood is stable, no new changes  Extremities: No lower extremity edema Breast: fungating left breast cancer All other systems were reviewed with the patient and are negative.  I have reviewed the past medical history, past surgical history, social history and family history with the patient and they are unchanged from previous note.  ALLERGIES:  is allergic to aspirin.  MEDICATIONS:  Current Outpatient Prescriptions  Medication Sig Dispense Refill  . feeding supplement, ENSURE ENLIVE, (ENSURE ENLIVE) LIQD Take 237 mLs by mouth 3 (three) times daily between meals. 30 Bottle 0  . furosemide (LASIX) 20 MG tablet Take 1 tablet (20 mg total) by mouth daily. 30 tablet 0  . gabapentin (NEURONTIN) 300 MG capsule Take 1 capsule (300 mg total) by mouth 3 (three) times  daily. 90 capsule 3  . hydrocortisone (ANUSOL-HC) 25 MG suppository Place 1 suppository (25 mg total) rectally 2 (two) times daily as needed for hemorrhoids or anal itching. 12 suppository 0  . loperamide (IMODIUM) 2 MG capsule Take 1 capsule (2 mg total) by mouth as needed for diarrhea or loose stools. Take as  directed on package 30 capsule 0  . ondansetron (ZOFRAN) 8 MG tablet Take 1 tablet (8 mg total) by mouth every 8 (eight) hours as needed for nausea or vomiting. 20 tablet 0  . oxyCODONE-acetaminophen (PERCOCET/ROXICET) 5-325 MG tablet take 2 tablets by mouth every 4 hours if needed for severe pain 180 tablet 0  . pramoxine (PROCTOFOAM) 1 % foam Place 1 application rectally 3 (three) times daily as needed for anal itching. (Patient not taking: Reported on 03/09/2017) 15 g 0   No current facility-administered medications for this visit.    Facility-Administered Medications Ordered in Other Visits  Medication Dose Route Frequency Provider Last Rate Last Dose  . alteplase (CATHFLO ACTIVASE) injection 2 mg  2 mg Intracatheter Once Nicholas Lose, MD      . sodium chloride flush (NS) 0.9 % injection 10 mL  10 mL Intracatheter PRN Nicholas Lose, MD   10 mL at 12/25/16 1702    PHYSICAL EXAMINATION: ECOG PERFORMANCE STATUS: 2 - Symptomatic, <50% confined to bed  Vitals:   03/15/17 1355  BP: 108/72  Pulse: 94  Resp: 18  Temp: 97.9 F (36.6 C)  SpO2: 100%   Filed Weights   03/15/17 1355  Weight: 108 lb 4.8 oz (49.1 kg)    GENERAL:alert, no distress and comfortable SKIN: skin color, texture, turgor are normal, no rashes or significant lesions EYES: normal, Conjunctiva are pink and non-injected, sclera clear OROPHARYNX:no exudate, no erythema and lips, buccal mucosa, and tongue normal  NECK: supple, thyroid normal size, non-tender, without nodularity LYMPH:  no palpable lymphadenopathy in the cervical, axillary or inguinal LUNGS: clear to auscultation and percussion with normal breathing effort HEART: regular rate & rhythm and no murmurs and no lower extremity edema ABDOMEN:abdomen soft, non-tender and normal bowel sounds MUSCULOSKELETAL:no cyanosis of digits and no clubbing  NEURO: alert & oriented x 3 with fluent speech, no focal motor/sensory deficits EXTREMITIES: No lower extremity  edema BREAST:large fungating left breast cancer. (exam performed in the presence of a chaperone)  LABORATORY DATA:  I have reviewed the data as listed   Chemistry      Component Value Date/Time   NA 137 03/15/2017 1241   K 3.9 03/15/2017 1241   CL 102 03/08/2017 1625   CO2 24 03/15/2017 1241   BUN 15.0 03/15/2017 1241   CREATININE 0.7 03/15/2017 1241      Component Value Date/Time   CALCIUM 8.7 03/15/2017 1241   ALKPHOS 165 (H) 03/15/2017 1241   AST 31 03/15/2017 1241   ALT 16 03/15/2017 1241   BILITOT <0.22 03/15/2017 1241       Lab Results  Component Value Date   WBC 14.3 (H) 03/15/2017   HGB 8.1 (L) 03/15/2017   HCT 26.0 (L) 03/15/2017   MCV 76.9 (L) 03/15/2017   PLT 601 (H) 03/15/2017   NEUTROABS 12.4 (H) 03/15/2017    ASSESSMENT & PLAN:  Breast cancer of upper-outer quadrant of left female breast ([place]) 09/26/2015 Left breast 2:00 position suspicious mass 3.1 x 1.8 x 2.8 cm, indeterminate group of calcifications UIQ left breast needs stereotactic biopsy Left breast biopsy UOQ 10/22/2015: Invasive adenocarcinoma, grade 2-3, ER20%, PR 0%, Ki-67  30%, left breast UIQ: Fibroadenoma with extensive calcifications Clinical stage in May 2017: T2 N0 stage II a Clinical stage 05/08/2016: Stage IV  Hospitalization 5/5-5/7: Breast abscess Patient was lost to follow up Refused treatment previously opting for homeopathic treatments. She returnedback to see [place] in June.  Plan: 1. Palliative treatment with Taxol every 3 weeks started 11/18/2016 -------------------------------------------------------------------- Current treatment: Cycle 5Taxol Chemotherapy toxicities: Denies neuropathy.  Wound infection issues: Patient is currently getting wound care at home. But she continues to come to the emergency department as well as to [facility] for wound dressing changes.  Patient is at very high risk of infection. Unfortunately her clinic is not equipped to do proper dressing  changes for her.  Severe pain related to fungating tumor: Percocet PRN   Return to clinic in 3 weeks for cycle 6   I spent 25 minutes talking to the patient of which more than half was spent in counseling and coordination of care.  No orders of the defined types were placed in this encounter.  The patient has a good understanding of the overall plan. she agrees with it. she will call with any problems that may develop before the next visit here.   Rulon Eisenmenger, MD 03/15/17

## 2017-03-15 NOTE — ED Triage Notes (Signed)
Patient states she was sent by her physician to change the dressing on her left breast wound. Patient states the physician did not have the proper dressings available.  Patient has bilateral lower extremity edema. Patient states she is on fluid pills.

## 2017-03-15 NOTE — Progress Notes (Signed)
This nurse and the charge nurse have been paging this patient for an hour. She is not in the lobby, scheduling, Eula Fried, restroom. We will reschedule her chemotherapy til tomorrow.

## 2017-03-16 ENCOUNTER — Other Ambulatory Visit: Payer: Self-pay | Admitting: Hematology and Oncology

## 2017-03-16 ENCOUNTER — Ambulatory Visit: Payer: Self-pay

## 2017-03-16 NOTE — ED Provider Notes (Signed)
[facility] DEPT Provider Note   CSN: [id number] Arrival date & time: 03/15/17  1555     History   Chief Complaint Chief Complaint  Patient presents with  . wound care    HPI Ann Oliver is a 61 y.o. female.  HPI   61 yo F with known, large left chest wall tumor here for dressing change. Seen at oncologist today but sent here as pt has h/o bleeding during dressing changes. She is o/w without complaints. She has not noticed any increase in the odor or bleeding from her wound. Her pain is at baseline. No fevers. No cough or SOB. No other complaints.  Past Medical History:  Diagnosis Date  . Cancer ([place])   . Sinusitis   . Tumor cells     Patient Active Problem List   Diagnosis Date Noted  . Blood loss anemia 01/18/2017  . Port catheter in place 12/02/2016  . Necrotizing soft tissue infection 11/21/2016  . Cellulitis 10/28/2016  . Wound infection 10/28/2016  . Cellulitis of left breast 10/28/2016  . Encounter for palliative care   . Goals of care, counseling/discussion   . Breast cancer metastasized to lung, right ([place]) 09/25/2016  . Malnutrition of moderate degree 09/25/2016  . Breast cancer of upper-outer quadrant of left female breast ([place]) 10/22/2015  . THYROID NODULE, RIGHT 05/31/2007  . DEPRESSION 05/31/2007  . GERD 05/31/2007  . HOT FLASHES 05/31/2007  . HEADACHE 05/31/2007    Past Surgical History:  Procedure Laterality Date  . BIOPSY BREAST    . IR FLUORO GUIDE CV LINE RIGHT  11/06/2016  . IR US GUIDE VASC ACCESS RIGHT  11/06/2016    OB History    No data available       Home Medications    Prior to Admission medications   Medication Sig Start Date End Date Taking? Authorizing Provider  feeding supplement, ENSURE ENLIVE, (ENSURE ENLIVE) LIQD Take 237 mLs by mouth 3 (three) times daily between meals. 09/26/16  Yes [name], MD  furosemide (LASIX) 20 MG tablet Take 1 tablet (20 mg total) by mouth daily.  62/37/62  Yes Delora Fuel, MD  gabapentin (NEURONTIN) 300 MG capsule Take 1 capsule (300 mg total) by mouth 3 (three) times daily. 02/16/17  Yes Nicholas Lose, MD  oxyCODONE-acetaminophen (PERCOCET/ROXICET) 5-325 MG tablet take 2 tablets by mouth every 4 hours if needed for severe pain 03/03/17  Yes Nicholas Lose, MD  hydrocortisone (ANUSOL-HC) 25 MG suppository Place 1 suppository (25 mg total) rectally 2 (two) times daily as needed for hemorrhoids or anal itching. Patient not taking: Reported on 03/15/2017 03/05/17   Gardenia Phlegm, NP  loperamide (IMODIUM) 2 MG capsule Take 1 capsule (2 mg total) by mouth as needed for diarrhea or loose stools. Take as directed on package Patient not taking: Reported on 03/15/2017 03/09/17   Nicholas Lose, MD  ondansetron (ZOFRAN) 8 MG tablet Take 1 tablet (8 mg total) by mouth every 8 (eight) hours as needed for nausea or vomiting. Patient not taking: Reported on 03/15/2017 03/05/17   Gardenia Phlegm, NP  pramoxine (PROCTOFOAM) 1 % foam Place 1 application rectally 3 (three) times daily as needed for anal itching. Patient not taking: Reported on 03/09/2017 03/05/17   Gardenia Phlegm, NP    Family History Family History  Problem Relation Age of Onset  . Breast cancer Neg Hx     Social History Social History  Substance Use Topics  . Smoking status: Never  Smoker  . Smokeless tobacco: Never Used  . Alcohol use No     Allergies   Aspirin   Review of Systems Review of Systems  Constitutional: Negative for chills and fever.  HENT: Negative for congestion, rhinorrhea and sore throat.   Eyes: Negative for visual disturbance.  Respiratory: Negative for cough, shortness of breath and wheezing.   Cardiovascular: Positive for chest pain. Negative for leg swelling.  Gastrointestinal: Negative for abdominal pain, diarrhea, nausea and vomiting.  Genitourinary: Negative for dysuria, flank pain, vaginal bleeding and vaginal  discharge.  Musculoskeletal: Negative for neck pain.  Skin: Positive for wound. Negative for rash.  Allergic/Immunologic: Negative for immunocompromised state.  Neurological: Negative for syncope and headaches.  Hematological: Does not bruise/bleed easily.  All other systems reviewed and are negative.    Physical Exam Updated Vital Signs BP 98/67   Pulse 96   Temp 97.6 F (36.4 C) (Axillary)   Resp 18   Ht 5\' 1"  (1.549 m)   Wt 49 kg (108 lb)   SpO2 100%   BMI 20.41 kg/m   Physical Exam  Constitutional: She is oriented to person, place, and time. She appears well-developed and well-nourished. No distress.  HENT:  Head: Normocephalic and atraumatic.  Eyes: Conjunctivae are normal.  Neck: Neck supple.  Cardiovascular: Normal rate, regular rhythm and normal heart sounds.   Pulmonary/Chest: Effort normal. No respiratory distress. She has no wheezes.  Large fungating tumor mass to left lateral chest wall, with foul-smelling, chronic appearing fibrinous exudate and wound eschar overlying the entire mass. Several small areas of venous oozing but no pulsatile bleeding. Mild odor.   Abdominal: She exhibits no distension.  Musculoskeletal: She exhibits no edema.  Neurological: She is alert and oriented to person, place, and time. She exhibits normal muscle tone.  Skin: Skin is warm. Capillary refill takes less than 2 seconds. No rash noted.  Nursing note and vitals reviewed.    ED Treatments / Results  Labs (all labs ordered are listed, but only abnormal results are displayed) Labs Reviewed - No data to display  EKG  EKG Interpretation None       Radiology No results found.  Procedures Procedures (including critical care time)  Medications Ordered in ED Medications  oxyCODONE-acetaminophen (PERCOCET/ROXICET) 5-325 MG per tablet 2 tablet (2 tablets Oral Given 03/15/17 1822)     Initial Impression / Assessment and Plan / ED Course  I have reviewed the triage vital  signs and the nursing notes.  Pertinent labs & imaging results that were available during my care of the patient were reviewed by me and considered in my medical decision making (see chart for details).     61 yo F with PMHx metastatic breast CA with chronically infected, large left chest wall tumor here for dressing change. Wound appears similar to prior assessments by myself and per notes. No fever, no increased drainage/odor to suggest superinfection (chronic fungal infection noted however). Dressing changed by myself and nursing ,tolerated well. No other complaints at this time. D/c with at home dressing changes.  Final Clinical Impressions(s) / ED Diagnoses   Final diagnoses:  Encounter for wound care    New Prescriptions Discharge Medication List as of 03/15/2017  9:08 PM       Duffy Bruce, MD 03/16/17 1207

## 2017-03-18 ENCOUNTER — Other Ambulatory Visit: Payer: Self-pay

## 2017-03-18 ENCOUNTER — Ambulatory Visit: Payer: Self-pay | Admitting: Hematology and Oncology

## 2017-03-18 ENCOUNTER — Ambulatory Visit: Payer: Self-pay

## 2017-03-18 ENCOUNTER — Inpatient Hospital Stay (HOSPITAL_COMMUNITY)
Admission: EM | Admit: 2017-03-18 | Discharge: 2017-03-24 | DRG: 947 | Disposition: A | Discharge status: Home / Self Care | Payer: Medicaid Other | Attending: Internal Medicine | Admitting: Internal Medicine

## 2017-03-18 ENCOUNTER — Encounter (HOSPITAL_COMMUNITY): Payer: Self-pay | Admitting: Emergency Medicine

## 2017-03-18 ENCOUNTER — Emergency Department (HOSPITAL_COMMUNITY): Payer: Medicaid Other

## 2017-03-18 ENCOUNTER — Telehealth: Payer: Self-pay

## 2017-03-18 DIAGNOSIS — N644 Mastodynia: Secondary | ICD-10-CM | POA: Diagnosis present

## 2017-03-18 DIAGNOSIS — Z886 Allergy status to analgesic agent status: Secondary | ICD-10-CM | POA: Diagnosis not present

## 2017-03-18 DIAGNOSIS — D62 Acute posthemorrhagic anemia: Secondary | ICD-10-CM | POA: Diagnosis present

## 2017-03-18 DIAGNOSIS — Z170 Estrogen receptor positive status [ER+]: Secondary | ICD-10-CM | POA: Diagnosis not present

## 2017-03-18 DIAGNOSIS — C50412 Malignant neoplasm of upper-outer quadrant of left female breast: Secondary | ICD-10-CM | POA: Diagnosis present

## 2017-03-18 DIAGNOSIS — E43 Unspecified severe protein-calorie malnutrition: Secondary | ICD-10-CM | POA: Diagnosis present

## 2017-03-18 DIAGNOSIS — G893 Neoplasm related pain (acute) (chronic): Principal | ICD-10-CM | POA: Diagnosis present

## 2017-03-18 DIAGNOSIS — R45851 Suicidal ideations: Secondary | ICD-10-CM | POA: Diagnosis present

## 2017-03-18 DIAGNOSIS — Z79899 Other long term (current) drug therapy: Secondary | ICD-10-CM | POA: Diagnosis not present

## 2017-03-18 DIAGNOSIS — C50911 Malignant neoplasm of unspecified site of right female breast: Secondary | ICD-10-CM | POA: Diagnosis not present

## 2017-03-18 DIAGNOSIS — R58 Hemorrhage, not elsewhere classified: Secondary | ICD-10-CM | POA: Diagnosis not present

## 2017-03-18 DIAGNOSIS — F32A Depression, unspecified: Secondary | ICD-10-CM | POA: Diagnosis present

## 2017-03-18 DIAGNOSIS — F0631 Mood disorder due to known physiological condition with depressive features: Secondary | ICD-10-CM | POA: Diagnosis not present

## 2017-03-18 DIAGNOSIS — S21009A Unspecified open wound of unspecified breast, initial encounter: Secondary | ICD-10-CM | POA: Diagnosis present

## 2017-03-18 DIAGNOSIS — N63 Unspecified lump in unspecified breast: Secondary | ICD-10-CM

## 2017-03-18 DIAGNOSIS — C50912 Malignant neoplasm of unspecified site of left female breast: Secondary | ICD-10-CM | POA: Diagnosis not present

## 2017-03-18 DIAGNOSIS — C7801 Secondary malignant neoplasm of right lung: Secondary | ICD-10-CM | POA: Diagnosis present

## 2017-03-18 DIAGNOSIS — F329 Major depressive disorder, single episode, unspecified: Secondary | ICD-10-CM | POA: Diagnosis present

## 2017-03-18 DIAGNOSIS — D630 Anemia in neoplastic disease: Secondary | ICD-10-CM | POA: Diagnosis present

## 2017-03-18 DIAGNOSIS — N179 Acute kidney failure, unspecified: Secondary | ICD-10-CM | POA: Diagnosis present

## 2017-03-18 DIAGNOSIS — G4700 Insomnia, unspecified: Secondary | ICD-10-CM | POA: Diagnosis present

## 2017-03-18 DIAGNOSIS — D72829 Elevated white blood cell count, unspecified: Secondary | ICD-10-CM | POA: Diagnosis present

## 2017-03-18 DIAGNOSIS — D649 Anemia, unspecified: Secondary | ICD-10-CM

## 2017-03-18 DIAGNOSIS — N6459 Other signs and symptoms in breast: Secondary | ICD-10-CM | POA: Diagnosis present

## 2017-03-18 DIAGNOSIS — D72828 Other elevated white blood cell count: Secondary | ICD-10-CM | POA: Diagnosis not present

## 2017-03-18 DIAGNOSIS — C799 Secondary malignant neoplasm of unspecified site: Secondary | ICD-10-CM | POA: Diagnosis not present

## 2017-03-18 LAB — I-STAT CHEM 8, ED
BUN: 11 mg/dL (ref 6–20)
CALCIUM ION: 1.12 mmol/L — AB (ref 1.15–1.40)
CHLORIDE: 100 mmol/L — AB (ref 101–111)
CREATININE: 0.6 mg/dL (ref 0.44–1.00)
GLUCOSE: 102 mg/dL — AB (ref 65–99)
HCT: 22 % — ABNORMAL LOW (ref 36.0–46.0)
HEMOGLOBIN: 7.5 g/dL — AB (ref 12.0–15.0)
POTASSIUM: 4 mmol/L (ref 3.5–5.1)
Sodium: 137 mmol/L (ref 135–145)
TCO2: 28 mmol/L (ref 22–32)

## 2017-03-18 LAB — CBC WITH DIFFERENTIAL/PLATELET
BASOS ABS: 0 10*3/uL (ref 0.0–0.1)
Basophils Relative: 0 %
EOS PCT: 1 %
Eosinophils Absolute: 0.2 10*3/uL (ref 0.0–0.7)
HEMATOCRIT: 22.8 % — AB (ref 36.0–46.0)
Hemoglobin: 7.2 g/dL — ABNORMAL LOW (ref 12.0–15.0)
LYMPHS ABS: 1.1 10*3/uL (ref 0.7–4.0)
LYMPHS PCT: 6 %
MCH: 24.7 pg — AB (ref 26.0–34.0)
MCHC: 31.6 g/dL (ref 30.0–36.0)
MCV: 78.4 fL (ref 78.0–100.0)
MONO ABS: 1.4 10*3/uL — AB (ref 0.1–1.0)
MONOS PCT: 7 %
NEUTROS ABS: 15.7 10*3/uL — AB (ref 1.7–7.7)
Neutrophils Relative %: 86 %
PLATELETS: 503 10*3/uL — AB (ref 150–400)
RBC: 2.91 MIL/uL — ABNORMAL LOW (ref 3.87–5.11)
RDW: 18.1 % — AB (ref 11.5–15.5)
WBC: 18.3 10*3/uL — ABNORMAL HIGH (ref 4.0–10.5)

## 2017-03-18 LAB — HEMOGLOBIN AND HEMATOCRIT, BLOOD
HCT: 27.7 % — ABNORMAL LOW (ref 36.0–46.0)
HEMOGLOBIN: 9.1 g/dL — AB (ref 12.0–15.0)

## 2017-03-18 LAB — PREPARE RBC (CROSSMATCH)

## 2017-03-18 MED ORDER — VANCOMYCIN HCL IN DEXTROSE 1-5 GM/200ML-% IV SOLN
1000.0000 mg | Freq: Once | INTRAVENOUS | Status: AC
  Administered 2017-03-18: 1000 mg via INTRAVENOUS
  Filled 2017-03-18: qty 200

## 2017-03-18 MED ORDER — ALTEPLASE 2 MG IJ SOLR
2.0000 mg | Freq: Once | INTRAMUSCULAR | Status: AC
  Administered 2017-03-18: 2 mg
  Filled 2017-03-18: qty 2

## 2017-03-18 MED ORDER — PIPERACILLIN-TAZOBACTAM 3.375 G IVPB
3.3750 g | Freq: Three times a day (TID) | INTRAVENOUS | Status: DC
  Administered 2017-03-18 – 2017-03-22 (×12): 3.375 g via INTRAVENOUS
  Filled 2017-03-18 (×13): qty 50

## 2017-03-18 MED ORDER — PIPERACILLIN-TAZOBACTAM 3.375 G IVPB 30 MIN
3.3750 g | Freq: Once | INTRAVENOUS | Status: AC
  Administered 2017-03-18: 3.375 g via INTRAVENOUS
  Filled 2017-03-18: qty 50

## 2017-03-18 MED ORDER — LIDOCAINE-EPINEPHRINE (PF) 2 %-1:200000 IJ SOLN
INTRAMUSCULAR | Status: AC
  Filled 2017-03-18: qty 20

## 2017-03-18 MED ORDER — HYDROMORPHONE HCL 1 MG/ML IJ SOLN
1.0000 mg | INTRAMUSCULAR | Status: DC | PRN
Start: 2017-03-18 — End: 2017-03-24
  Administered 2017-03-18 – 2017-03-24 (×26): 1 mg via INTRAVENOUS
  Filled 2017-03-18 (×26): qty 1

## 2017-03-18 MED ORDER — FENTANYL CITRATE (PF) 100 MCG/2ML IJ SOLN
100.0000 ug | Freq: Once | INTRAMUSCULAR | Status: AC
  Administered 2017-03-18: 100 ug via INTRAVENOUS
  Filled 2017-03-18: qty 2

## 2017-03-18 MED ORDER — ACETAMINOPHEN 650 MG RE SUPP: 650.0000 mg | Freq: Four times a day (QID) | RECTAL | Status: DC | PRN

## 2017-03-18 MED ORDER — ACETAMINOPHEN 325 MG PO TABS: 650.0000 mg | ORAL_TABLET | Freq: Four times a day (QID) | ORAL | Status: DC | PRN

## 2017-03-18 MED ORDER — TRANEXAMIC ACID 1000 MG/10ML IV SOLN
500.0000 mg | Freq: Once | INTRAVENOUS | Status: AC
  Administered 2017-03-18: 500 mg via TOPICAL
  Filled 2017-03-18: qty 10

## 2017-03-18 MED ORDER — VANCOMYCIN HCL IN DEXTROSE 750-5 MG/150ML-% IV SOLN
750.0000 mg | Freq: Two times a day (BID) | INTRAVENOUS | Status: DC
  Administered 2017-03-18 – 2017-03-22 (×8): 750 mg via INTRAVENOUS
  Filled 2017-03-18 (×8): qty 150

## 2017-03-18 MED ORDER — FENTANYL CITRATE (PF) 100 MCG/2ML IJ SOLN
50.0000 ug | INTRAMUSCULAR | Status: AC | PRN
  Administered 2017-03-18 (×3): 50 ug via INTRAVENOUS
  Filled 2017-03-18 (×3): qty 2

## 2017-03-18 MED ORDER — SODIUM CHLORIDE 0.9% FLUSH
3.0000 mL | Freq: Two times a day (BID) | INTRAVENOUS | Status: DC
  Administered 2017-03-19 – 2017-03-24 (×7): 3 mL via INTRAVENOUS

## 2017-03-18 MED ORDER — SODIUM CHLORIDE 0.9 % IV SOLN: Freq: Once | INTRAVENOUS | Status: DC

## 2017-03-18 MED ORDER — VANCOMYCIN HCL 500 MG IV SOLR: 500.0000 mg | Freq: Two times a day (BID) | INTRAVENOUS | Status: DC

## 2017-03-18 MED ORDER — ONDANSETRON HCL 4 MG PO TABS
4.0000 mg | ORAL_TABLET | Freq: Four times a day (QID) | ORAL | Status: DC | PRN
  Administered 2017-03-22: 4 mg via ORAL
  Filled 2017-03-18: qty 1

## 2017-03-18 MED ORDER — ONDANSETRON HCL 4 MG/2ML IJ SOLN
4.0000 mg | Freq: Four times a day (QID) | INTRAMUSCULAR | Status: DC | PRN
  Administered 2017-03-20 – 2017-03-21 (×2): 4 mg via INTRAVENOUS
  Filled 2017-03-18 (×2): qty 2

## 2017-03-18 MED ORDER — FENTANYL CITRATE (PF) 100 MCG/2ML IJ SOLN
50.0000 ug | INTRAMUSCULAR | Status: DC | PRN
Start: 2017-03-18 — End: 2017-03-19
  Administered 2017-03-18 – 2017-03-19 (×4): 50 ug via INTRAVENOUS
  Filled 2017-03-18 (×4): qty 2

## 2017-03-18 MED ORDER — SODIUM CHLORIDE 0.9% FLUSH
10.0000 mL | INTRAVENOUS | Status: DC | PRN
Start: 2017-03-18 — End: 2017-03-24
  Administered 2017-03-19 – 2017-03-23 (×2): 10 mL
  Filled 2017-03-18 (×2): qty 40

## 2017-03-18 NOTE — ED Notes (Signed)
Bed: JQ49 Expected date:  Expected time:  Means of arrival:  Comments: Held for RES B

## 2017-03-18 NOTE — ED Provider Notes (Signed)
[facility] DEPT Provider Note   CSN: [id number] Arrival date & time: 03/18/17  0121     History   Chief Complaint Chief Complaint  Patient presents with  . left side pain  . Suicidal    HPI Ann Oliver is a 61 y.o. female.  The history is provided by the patient.  Illness  This is a chronic problem. The current episode started more than 1 week ago. The problem occurs constantly. The problem has been gradually worsening. Pertinent negatives include no headaches and no shortness of breath. Associated symptoms comments: Pain at site of fungating mass which is unresponsive to home pain medication.  Also is feeling depressed and reports suicidal thoughts secondary to hopelessness over the pain. Nothing aggravates the symptoms. Nothing relieves the symptoms. She has tried nothing for the symptoms. The treatment provided no relief.    Past Medical History:  Diagnosis Date  . Cancer ([place])   . Sinusitis   . Tumor cells     Patient Active Problem List   Diagnosis Date Noted  . Blood loss anemia 01/18/2017  . Port catheter in place 12/02/2016  . Necrotizing soft tissue infection 11/21/2016  . Cellulitis 10/28/2016  . Wound infection 10/28/2016  . Cellulitis of left breast 10/28/2016  . Encounter for palliative care   . Goals of care, counseling/discussion   . Breast cancer metastasized to lung, right ([place]) 09/25/2016  . Malnutrition of moderate degree 09/25/2016  . Breast cancer of upper-outer quadrant of left female breast ([place]) 10/22/2015  . THYROID NODULE, RIGHT 05/31/2007  . DEPRESSION 05/31/2007  . GERD 05/31/2007  . HOT FLASHES 05/31/2007  . HEADACHE 05/31/2007    Past Surgical History:  Procedure Laterality Date  . BIOPSY BREAST    . IR FLUORO GUIDE CV LINE RIGHT  11/06/2016  . IR US GUIDE VASC ACCESS RIGHT  11/06/2016    OB History    No data available       Home Medications    Prior to Admission medications     Medication Sig Start Date End Date Taking? Authorizing Provider  feeding supplement, ENSURE ENLIVE, (ENSURE ENLIVE) LIQD Take 237 mLs by mouth 3 (three) times daily between meals. 09/26/16  Yes [name], MD  furosemide (LASIX) 20 MG tablet Take 1 tablet (20 mg total) by mouth daily. 54/27/06  Yes Delora Fuel, MD  gabapentin (NEURONTIN) 300 MG capsule Take 1 capsule (300 mg total) by mouth 3 (three) times daily. 02/16/17  Yes Nicholas Lose, MD  oxyCODONE-acetaminophen (PERCOCET/ROXICET) 5-325 MG tablet take 2 tablets by mouth every 4 hours if needed for severe pain 03/03/17  Yes Nicholas Lose, MD  hydrocortisone (ANUSOL-HC) 25 MG suppository Place 1 suppository (25 mg total) rectally 2 (two) times daily as needed for hemorrhoids or anal itching. Patient not taking: Reported on 03/15/2017 03/05/17   Gardenia Phlegm, NP  loperamide (IMODIUM) 2 MG capsule Take 1 capsule (2 mg total) by mouth as needed for diarrhea or loose stools. Take as directed on package Patient not taking: Reported on 03/15/2017 03/09/17   Nicholas Lose, MD  ondansetron (ZOFRAN) 8 MG tablet Take 1 tablet (8 mg total) by mouth every 8 (eight) hours as needed for nausea or vomiting. Patient not taking: Reported on 03/15/2017 03/05/17   Gardenia Phlegm, NP  pramoxine (PROCTOFOAM) 1 % foam Place 1 application rectally 3 (three) times daily as needed for anal itching. Patient not taking: Reported on 03/09/2017 03/05/17   Gardenia Phlegm,  NP    Family History Family History  Problem Relation Age of Onset  . Breast cancer Neg Hx     Social History Social History  Substance Use Topics  . Smoking status: Never Smoker  . Smokeless tobacco: Never Used  . Alcohol use No     Allergies   Aspirin   Review of Systems Review of Systems  Respiratory: Negative for shortness of breath.   Cardiovascular: Negative for palpitations and leg swelling.       Tumor and chest wall pain  Neurological:  Negative for headaches.  All other systems reviewed and are negative.    Physical Exam Updated Vital Signs BP 122/79   Pulse 92   Temp 99.8 F (37.7 C) (Oral)   Resp 16   SpO2 100%   Physical Exam  Constitutional: She is oriented to person, place, and time. She appears well-developed and well-nourished. No distress.  HENT:  Head: Normocephalic and atraumatic.  Mouth/Throat: No oropharyngeal exudate.  Eyes: Pupils are equal, round, and reactive to light. EOM are normal.  pale  Neck: Normal range of motion. Neck supple. No JVD present.  Cardiovascular: Normal rate, regular rhythm, normal heart sounds and intact distal pulses.   Pulmonary/Chest: Effort normal and breath sounds normal. No stridor. She has no wheezes. She has no rales.    Abdominal: Soft. Bowel sounds are normal. She exhibits no mass. There is no tenderness. There is no guarding.  Musculoskeletal: Normal range of motion. She exhibits no edema.  Neurological: She is alert and oriented to person, place, and time. She displays normal reflexes.  Skin: Skin is warm and dry. Capillary refill takes less than 2 seconds. There is pallor.  Psychiatric: She has a normal mood and affect.     ED Treatments / Results  Labs (all labs ordered are listed, but only abnormal results are displayed) Labs Reviewed  CBC WITH DIFFERENTIAL/PLATELET - Abnormal; Notable for the following:       Result Value   WBC 18.3 (*)    RBC 2.91 (*)    Hemoglobin 7.2 (*)    HCT 22.8 (*)    MCH 24.7 (*)    RDW 18.1 (*)    Platelets 503 (*)    Neutro Abs 15.7 (*)    Monocytes Absolute 1.4 (*)    All other components within normal limits  I-STAT CHEM 8, ED - Abnormal; Notable for the following:    Chloride 100 (*)    Glucose, Bld 102 (*)    Calcium, Ion 1.12 (*)    Hemoglobin 7.5 (*)    HCT 22.0 (*)    All other components within normal limits    EKG  EKG Interpretation None       Radiology No results  found.  Procedures Cauterization Date/Time: 03/18/2017 6:49 AM Performed by: Veatrice Kells Authorized by: Veatrice Kells  Consent: Verbal consent obtained. Risks and benefits: risks, benefits and alternatives were discussed Patient identity confirmed: arm band Local anesthesia used: no  Anesthesia: Local anesthesia used: no  Sedation: Patient sedated: no Patient tolerance: Patient tolerated the procedure well with no immediate complications Comments: Electrocautery following failed chemical cautery with tranexamic acid failed, patient was bleeding from open defect approximately the diameter of a dime at the 6 oclock position on the left breast mass.      (including critical care time)  Medications Ordered in ED Medications  tranexamic acid (CYKLOKAPRON) injection 500 mg (not administered)  lidocaine-EPINEPHrine (XYLOCAINE W/EPI) 2 %-1:200000 (PF) injection (  not administered)  vancomycin (VANCOCIN) IVPB 1000 mg/200 mL premix (not administered)  piperacillin-tazobactam (ZOSYN) IVPB 3.375 g (not administered)  fentaNYL (SUBLIMAZE) injection 100 mcg (not administered)  fentaNYL (SUBLIMAZE) injection 100 mcg (100 mcg Intravenous Given 03/18/17 0522)       Final Clinical Impressions(s) / ED Diagnoses   Will admit for pain control and wound infection.  Patient is also feeling suicidal secondary to her illness but cannot be medically cleared for psychiatry at this time New Prescriptions New Prescriptions   No medications on file     Mishika Flippen, MD 03/18/17 [phone]

## 2017-03-18 NOTE — ED Notes (Signed)
PA at bedside observing wound and changing dressing.

## 2017-03-18 NOTE — ED Triage Notes (Signed)
Pt brought in from home via EMS for c/o pain to the left side of her body  Pt c/o pain from her left breast up into her left shoulder down her left side  Pt has a PIC line in her right arm  Pt is receiving chemotherapy  Pt was given oxycodone for pain but states it is not helping  Pt made a comment to EMS that the pain is too much for her and she had thoughts of killing herself by taking too much of her pain medications but then added she did not want her family to come and find her dead  Pt is tearful in conversation

## 2017-03-18 NOTE — Progress Notes (Addendum)
Pharmacy Antibiotic Note  Ann Oliver is a 60 y.o. female admitted on 03/18/2017 with cute bleeding and cellulitis of breast mass. PMH significant for metastatic breast cancer undergoing chemotherapy.  Pharmacy has been consulted for vancomycin and zoyn dosing.  Vancomycin 1g and Zosyn 3.375g doses given in ED.  Plan: Zosyn 3.375g IV q8h (4 hour infusion).  Vancomycin 750 mg IV q12h based on previous trough level history.  Aiming for VT 15-20 mcg/mL given appearance of wound involving deeper tissue. Daily SCr.     Temp (24hrs), Avg:99.4 F (37.4 C), Min:98.9 F (37.2 C), Max:99.8 F (37.7 C)   Recent Labs Lab 03/15/17 1241 03/18/17 0517 03/18/17 0529  WBC 14.3* 18.3*  --   CREATININE 0.7  --  0.60    Estimated Creatinine Clearance: 55 mL/min (by C-G formula based on SCr of 0.6 mg/dL).    Allergies  Allergen Reactions  . Aspirin Palpitations    Antimicrobials this admission: 10/24 Vancomycin >>  10/24 Zosyn >>  Dose adjustments this admission:  Microbiology results: 10/25 BCx:   Thank you for allowing pharmacy to be a part of this patient's care.  Hershal Coria 03/18/2017 8:58 AM

## 2017-03-18 NOTE — H&P (Addendum)
[facility] History and Physical  Ann Oliver HGD:[id number] DOB: [birth year] DOA: 03/18/2017  Referring physician:  PCP: [facility]   Chief Complaint: "I hurts."  HPI: Ann Oliver is a 61 y.o. female   with pmh metastatic breast cancer (+BRCA) currently undergoing palliative chemotherapy presents with left side pain. Patient reported to have thoughts of wanting to harm herself due to pain. Pt states she came for the pain only. Not sure when it started bleeding.   ED Course: ED physician noted large breast mass with bleeding and pus requiring administration of tranexamic acid to try and control bleeding.    Review of Systems:  As per HPI otherwise 10 point review of systems negative.    Past Medical History:  Diagnosis Date  . Cancer ([place])   . Sinusitis   . Tumor cells    Past Surgical History:  Procedure Laterality Date  . BIOPSY BREAST    . IR FLUORO GUIDE CV LINE RIGHT  11/06/2016  . IR US GUIDE VASC ACCESS RIGHT  11/06/2016   Social History:  reports that she has never smoked. She has never used smokeless tobacco. She reports that she does not drink alcohol or use drugs.  Allergies  Allergen Reactions  . Aspirin Palpitations    Family History  Problem Relation Age of Onset  . Breast cancer Neg Hx      Prior to Admission medications   Medication Sig Start Date End Date Taking? Authorizing Provider  feeding supplement, ENSURE ENLIVE, (ENSURE ENLIVE) LIQD Take 237 mLs by mouth 3 (three) times daily between meals. 09/26/16  Yes [name], MD  furosemide (LASIX) 20 MG tablet Take 1 tablet (20 mg total) by mouth daily. 96/22/29  Yes Delora Fuel, MD  gabapentin (NEURONTIN) 300 MG capsule Take 1 capsule (300 mg total) by mouth 3 (three) times daily. 02/16/17  Yes Nicholas Lose, MD  oxyCODONE-acetaminophen (PERCOCET/ROXICET) 5-325 MG tablet take 2 tablets by mouth every 4 hours if needed for severe pain 03/03/17  Yes Nicholas Lose,  MD  hydrocortisone (ANUSOL-HC) 25 MG suppository Place 1 suppository (25 mg total) rectally 2 (two) times daily as needed for hemorrhoids or anal itching. Patient not taking: Reported on 03/15/2017 03/05/17   Gardenia Phlegm, NP  loperamide (IMODIUM) 2 MG capsule Take 1 capsule (2 mg total) by mouth as needed for diarrhea or loose stools. Take as directed on package Patient not taking: Reported on 03/15/2017 03/09/17   Nicholas Lose, MD  ondansetron (ZOFRAN) 8 MG tablet Take 1 tablet (8 mg total) by mouth every 8 (eight) hours as needed for nausea or vomiting. Patient not taking: Reported on 03/15/2017 03/05/17   Gardenia Phlegm, NP  pramoxine (PROCTOFOAM) 1 % foam Place 1 application rectally 3 (three) times daily as needed for anal itching. Patient not taking: Reported on 03/09/2017 03/05/17   Gardenia Phlegm, NP   Physical Exam: Vitals:   03/18/17 0415 03/18/17 0430 03/18/17 0500 03/18/17 0730  BP:  105/63 122/79 97/64  Pulse: 90 94 92 96  Resp:  16    Temp:      TempSrc:      SpO2: 100% 100% 100% 100%    Wt Readings from Last 3 Encounters:  03/15/17 49 kg (108 lb)  03/15/17 49.1 kg (108 lb 4.8 oz)  03/08/17 54.4 kg (120 lb)    General:  Appears calm and comfortable, dehydrated, anxious Eyes:  PERRL, EOMI, normal lids, iris ENT:  grossly normal hearing, lips &  tongue Neck:  no LAD, masses or thyromegaly Cardiovascular:  RRR, no m/r/g. No LE edema.  Large pressure dressing covers the chest Respiratory:  CTA bilaterally, no w/r/r. Normal respiratory effort. Abdomen:  soft, ntnd Skin:  no rash or induration seen on limited exam Musculoskeletal:  grossly normal tone BUE/BLE Psychiatric:  grossly normal mood and affect, speech fluent and appropriate Neurologic:  CN 2-12 grossly intact, moves all extremities in coordinated fashion.          Labs on Admission:  Basic Metabolic Panel:  Recent Labs Lab 03/15/17 1241 03/18/17 0529  NA 137 137  K  3.9 4.0  CL  --  100*  CO2 24  --   GLUCOSE 110 102*  BUN 15.0 11  CREATININE 0.7 0.60  CALCIUM 8.7  --    Liver Function Tests:  Recent Labs Lab 03/15/17 1241  AST 31  ALT 16  ALKPHOS 165*  BILITOT <0.22  PROT 6.4  ALBUMIN 2.3*   No results for input(s): LIPASE, AMYLASE in the last 168 hours. No results for input(s): AMMONIA in the last 168 hours. CBC:  Recent Labs Lab 03/15/17 1241 03/18/17 0517 03/18/17 0529  WBC 14.3* 18.3*  --   NEUTROABS 12.4* 15.7*  --   HGB 8.1* 7.2* 7.5*  HCT 26.0* 22.8* 22.0*  MCV 76.9* 78.4  --   PLT 601* 503*  --    Cardiac Enzymes: No results for input(s): CKTOTAL, CKMB, CKMBINDEX, TROPONINI in the last 168 hours.  BNP (last 3 results) No results for input(s): BNP in the last 8760 hours.  ProBNP (last 3 results) No results for input(s): PROBNP in the last 8760 hours.   Serum creatinine: 0.6 mg/dL 03/18/17 0529 Estimated creatinine clearance: 55 mL/min  CBG: No results for input(s): GLUCAP in the last 168 hours.  Radiological Exams on Admission: Dg Chest Portable 1 View  Result Date: 03/18/2017 CLINICAL DATA:  LEFT chest pain. EXAM: PORTABLE CHEST 1 VIEW COMPARISON:  Chest radiograph March 08, 2017 and CT chest October 28, 2016 FINDINGS: Limited assessment LEFT lung due to overlying dense LEFT breast mass. Patient rotated to the RIGHT. Cardiomediastinal silhouette is normal. Known pulmonary metastasis better characterized on prior CT. No pleural effusion or focal consolidation. LEFT apical pleural thickening. No pneumothorax. RIGHT PICC distal tip projects the cavoatrial junction. Osseous structures are unchanged. IMPRESSION: No acute cardiopulmonary process. Large LEFT chest wall mass. Stable RIGHT PICC distal tip at cavoatrial junction. Electronically Signed   By: Elon Alas M.D.   On: 03/18/2017 06:35    EKG: none, pressure dressing on chest  Assessment/Plan Active Problems:   Acute bleeding   Acute bleeding of  breast mass Spoke to onc about possible toilet mastectomy w/ delayed secondary closure Gen surg consult Fentanyl prn for pain  Infected breast wound Cont vanc and zosyn Blood cult x2 pending  Acute blood loss anemia Transfuse 2 u prbcs  Depressed Consider starting on 164m of zoloft on d/c  Suicidal comment No SI/HI  Code Status: FULL  DVT Prophylaxis: SCDs Family Communication: pt declined Disposition Plan: Pending Improvement  Status: inpt tele  PElwin Mocha MD Family Medicine [facility] [email or web address] Password [email or web address]

## 2017-03-18 NOTE — Plan of Care (Signed)
Discussed patient with Dr. Randal Buba.  Ms. Montilla is a 61 year old female with pmh metastatic breast cancer (+BRCA) originally diagnosed in May 2017 currently undergoing palliative chemotherapy; who presents with left side pain. Patient reported to have thoughts of wanting to harm herself due to pain. ED physician noted large breast mass with bleeding and pus requiring administration of tranexamic acid to try and control bleeding.   VS: T- 99.65F, pulse 57-100, respirations 16-20, blood pressure 104/62 to 124/85, O2 sats maintianed. Labs revealed WBC 18.3, hemoglobin 7.2, platelets 503.  Patient was given antibiotics of vancomycin and Zosyn. Patient received multiple doses of fentanyl for pain. T&S ordered. TR[facility] called to admit for pain control and management of breast mass.

## 2017-03-18 NOTE — ED Notes (Signed)
ED Provider at bedside. 

## 2017-03-18 NOTE — ED Notes (Signed)
Notified EDP, Palumbo,MD., pt. I-stat Chem 8 results hemoglobin 7.5 and RN,Lisa made aware.

## 2017-03-18 NOTE — ED Notes (Signed)
Bed: VF47 Expected date:  Expected time:  Means of arrival:  Comments: EMS cancer patient SI

## 2017-03-18 NOTE — Consult Note (Signed)
Reason for Consult: Necrotic left breast cancer with uncontrolled bleeding Referring Physician: Dr. Janyce Llanos  Ann Oliver is an 61 y.o. female.   HPI: Patient is a 61 year old female with saw back in June 2018 with a fungating left breast mass previously diagnosed as a cancer in May 2017.  She had previously been seen by surgery and offered a mastectomy , which she declined on more than one occasion. She was scheduled for chemotherapy but was lost for follow-up initially.   She was seen in May 2018 and breast mass was treated with antibiotics and wet-to-dry dressing changes.  She has been seen on October 30, 2016, November 21, 2016, 12/13/2016, 8/30 and 01/22/17 by our service.  She had multiple evaluations in the ED for bleeding, since those evaluations.  Her last visit was on 03/15/2017.  She is currently on palliative treatment by Dr.Vinay Gudena. She presented to the ED this morning at 1:23 AM via EMS.  Complaining of uncontrolled left breast pain, feeling hopeless and suicidal.  While in the ED she had significant bleeding and drainage from her left breast.  Dr. Florene Glen says she filled a gurney full of blood.  She was treated with tranexamic acid to try and control bleeding. Combat dressing is in the lower portion of the tumor where the major bleeding has occurred. Site was controlled in this manner.  She has been started on IV Zosyn and vancomycin.  Workup in the ED: She is afebrile vital signs are stable.  Labs show a white count of 18,300, hemoglobin 7.2 hematocrit 22.8.  Electrolytes are normal.  Alk phos 165, albumin 2.3.  Chest x-ray shows no acute cardiopulmonary process left chest wall mass and right PICC line.     Past Medical History:  Diagnosis Date  . Cancer ([place])   . Sinusitis   . Tumor cells     Past Surgical History:  Procedure Laterality Date  . BIOPSY BREAST    . IR FLUORO GUIDE CV LINE RIGHT  11/06/2016  . IR US GUIDE VASC ACCESS RIGHT  11/06/2016    Family History  Problem  Relation Age of Onset  . Breast cancer Neg Hx     Social History:  reports that she has never smoked. She has never used smokeless tobacco. She reports that she does not drink alcohol or use drugs.  Allergies:  Allergies  Allergen Reactions  . Aspirin Palpitations    Prior to Admission medications   Medication Sig Start Date End Date Taking? Authorizing Provider  feeding supplement, ENSURE ENLIVE, (ENSURE ENLIVE) LIQD Take 237 mLs by mouth 3 (three) times daily between meals. 09/26/16  Yes [name], MD  furosemide (LASIX) 20 MG tablet Take 1 tablet (20 mg total) by mouth daily. 93/26/71  Yes Delora Fuel, MD  gabapentin (NEURONTIN) 300 MG capsule Take 1 capsule (300 mg total) by mouth 3 (three) times daily. 02/16/17  Yes Nicholas Lose, MD  oxyCODONE-acetaminophen (PERCOCET/ROXICET) 5-325 MG tablet take 2 tablets by mouth every 4 hours if needed for severe pain 03/03/17  Yes Nicholas Lose, MD  hydrocortisone (ANUSOL-HC) 25 MG suppository Place 1 suppository (25 mg total) rectally 2 (two) times daily as needed for hemorrhoids or anal itching. Patient not taking: Reported on 03/15/2017 03/05/17   Gardenia Phlegm, NP  loperamide (IMODIUM) 2 MG capsule Take 1 capsule (2 mg total) by mouth as needed for diarrhea or loose stools. Take as directed on package Patient not taking: Reported on 03/15/2017 03/09/17   [name],  Loleta Dicker, MD  ondansetron (ZOFRAN) 8 MG tablet Take 1 tablet (8 mg total) by mouth every 8 (eight) hours as needed for nausea or vomiting. Patient not taking: Reported on 03/15/2017 03/05/17   Gardenia Phlegm, NP  pramoxine (PROCTOFOAM) 1 % foam Place 1 application rectally 3 (three) times daily as needed for anal itching. Patient not taking: Reported on 03/09/2017 03/05/17   Gardenia Phlegm, NP   Anti-infectives    Start     Dose/Rate Route Frequency Ordered Stop   03/18/17 1800  vancomycin (VANCOCIN) 500 mg in sodium chloride 0.9 % 100 mL IVPB      500 mg 100 mL/hr over 60 Minutes Intravenous Every 12 hours 03/18/17 0905     03/18/17 1200  piperacillin-tazobactam (ZOSYN) IVPB 3.375 g     3.375 g 12.5 mL/hr over 240 Minutes Intravenous Every 8 hours 03/18/17 0905     03/18/17 0600  vancomycin (VANCOCIN) IVPB 1000 mg/200 mL premix     1,000 mg 200 mL/hr over 60 Minutes Intravenous  Once 03/18/17 0556 03/18/17 0756   03/18/17 0600  piperacillin-tazobactam (ZOSYN) IVPB 3.375 g     3.375 g 100 mL/hr over 30 Minutes Intravenous  Once 03/18/17 0556 03/18/17 0657       Results for orders placed or performed during the hospital encounter of 03/18/17 (from the past 48 hour(s))  CBC with Differential/Platelet     Status: Abnormal   Collection Time: 03/18/17  5:17 AM  Result Value Ref Range   WBC 18.3 (H) 4.0 - 10.5 K/uL   RBC 2.91 (L) 3.87 - 5.11 MIL/uL   Hemoglobin 7.2 (L) 12.0 - 15.0 g/dL   HCT 22.8 (L) 36.0 - 46.0 %   MCV 78.4 78.0 - 100.0 fL   MCH 24.7 (L) 26.0 - 34.0 pg   MCHC 31.6 30.0 - 36.0 g/dL   RDW 18.1 (H) 11.5 - 15.5 %   Platelets 503 (H) 150 - 400 K/uL   Neutrophils Relative % 86 %   Neutro Abs 15.7 (H) 1.7 - 7.7 K/uL   Lymphocytes Relative 6 %   Lymphs Abs 1.1 0.7 - 4.0 K/uL   Monocytes Relative 7 %   Monocytes Absolute 1.4 (H) 0.1 - 1.0 K/uL   Eosinophils Relative 1 %   Eosinophils Absolute 0.2 0.0 - 0.7 K/uL   Basophils Relative 0 %   Basophils Absolute 0.0 0.0 - 0.1 K/uL  I-Stat Chem 8, ED     Status: Abnormal   Collection Time: 03/18/17  5:29 AM  Result Value Ref Range   Sodium 137 135 - 145 mmol/L   Potassium 4.0 3.5 - 5.1 mmol/L   Chloride 100 (L) 101 - 111 mmol/L   BUN 11 6 - 20 mg/dL   Creatinine, Ser 0.60 0.44 - 1.00 mg/dL   Glucose, Bld 102 (H) 65 - 99 mg/dL   Calcium, Ion 1.12 (L) 1.15 - 1.40 mmol/L   TCO2 28 22 - 32 mmol/L   Hemoglobin 7.5 (L) 12.0 - 15.0 g/dL   HCT 22.0 (L) 36.0 - 46.0 %  Type and screen [place]     Status: None (Preliminary result)   Collection Time:  03/18/17  7:01 AM  Result Value Ref Range   ABO/RH(D) A POS    Antibody Screen NEG    Sample Expiration 03/21/2017    Unit Number [id number]    Blood Component Type RED CELLS,LR    Unit division 00    Status of Unit ALLOCATED  Transfusion Status OK TO TRANSFUSE    Crossmatch Result Compatible    Unit Number [id number]    Blood Component Type RED CELLS,LR    Unit division 00    Status of Unit ALLOCATED    Transfusion Status OK TO TRANSFUSE    Crossmatch Result Compatible   Prepare RBC     Status: None   Collection Time: 03/18/17  9:00 AM  Result Value Ref Range   Order Confirmation ORDER PROCESSED BY BLOOD BANK     Dg Chest Portable 1 View  Result Date: 03/18/2017 CLINICAL DATA:  LEFT chest pain. EXAM: PORTABLE CHEST 1 VIEW COMPARISON:  Chest radiograph March 08, 2017 and CT chest October 28, 2016 FINDINGS: Limited assessment LEFT lung due to overlying dense LEFT breast mass. Patient rotated to the RIGHT. Cardiomediastinal silhouette is normal. Known pulmonary metastasis better characterized on prior CT. No pleural effusion or focal consolidation. LEFT apical pleural thickening. No pneumothorax. RIGHT PICC distal tip projects the cavoatrial junction. Osseous structures are unchanged. IMPRESSION: No acute cardiopulmonary process. Large LEFT chest wall mass. Stable RIGHT PICC distal tip at cavoatrial junction. Electronically Signed   By: Elon Alas M.D.   On: 03/18/2017 06:35    Review of Systems  Constitutional: Negative.   HENT: Negative.   Eyes: Negative.   Respiratory: Negative.   Cardiovascular: Negative.   Gastrointestinal: Negative.   Genitourinary: Negative.   Musculoskeletal:       Left chest pain  Skin:       See picture  Neurological: Negative.   Endo/Heme/Allergies: Negative.   Psychiatric/Behavioral: Positive for depression and suicidal ideas.   Blood pressure 98/63, pulse 91, temperature 98.9 F (37.2 C), temperature source Oral, resp. rate 18,  SpO2 100 %. Physical Exam  Constitutional: She appears distressed.  Frail elderly cachectic female with a huge mass coming from her left chest wall.  HENT:  Head: Normocephalic and atraumatic.  Mouth/Throat: No oropharyngeal exudate.  Eyes: Right eye exhibits no discharge. Left eye exhibits no discharge. No scleral icterus.  Neck: Normal range of motion. Neck supple. No JVD present. No tracheal deviation present. No thyromegaly present.  Cardiovascular: Normal rate, regular rhythm, normal heart sounds and intact distal pulses.   No murmur heard. Respiratory: Effort normal and breath sounds normal. No respiratory distress. She has no wheezes. She has no rales. She exhibits no tenderness.  GI: Soft. Bowel sounds are normal. She exhibits no distension and no mass. There is no tenderness. There is no rebound and no guarding.  Musculoskeletal: She exhibits edema. She exhibits no tenderness.  Lymphadenopathy:    She has no cervical adenopathy.  Neurological: She is alert. No cranial nerve deficit.  Skin: Skin is warm and dry. No rash noted. She is not diaphoretic. No erythema. No pallor.  See picture below  Psychiatric: Judgment normal.  Pt has had pain meds and cannot seem to understand the concept of removing breast without fixing cancer.   This roughly measures 25 x 25 cm. The area with the gauze still in is actually combat gauze.  I did ultimately remove it and started bleeding.  The bleeding is from with in the tumor itself. The only way to control this was with direct pressure. We obtain new combat gauze and packed it with this.  The remainder of the tumor was covered with Xeroform.  Dry dressings including 4 x 4's Kerlix and then Ace wraps were used to control and cover the site.   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 Currently on palliative chemotherapy  Plan: I reviewed with Dr. Excell Seltzer and we agree that  hemostatic gauze, and direct pressure or the best current treatments.  I am not sure it be possible to resect this tumor at this point.  She is being admitted for medical management and transfusions.  We will follow with you.   Jayson Waterhouse 03/18/2017, 8:51 AM

## 2017-03-18 NOTE — ED Notes (Signed)
hospitalist at [facility]

## 2017-03-18 NOTE — Progress Notes (Signed)
Cardiac monitoring was ordered for patient. Due to dressing covering all of pts chest unable to place cardiac monitoring. Informed MD, stated to place her on pulse ox to monitor HR.

## 2017-03-18 NOTE — Progress Notes (Signed)
HEMATOLOGY-ONCOLOGY PROGRESS NOTE  SUBJECTIVE: Admitted with bleeding and painful fungating tumor. Missed chemo last week. Inability to get her tumor adequately dressed due to socioeconomic causes Our staff have been trying to arrange help for her but she doesnt answer her phone most of the time and has no family support.  OBJECTIVE: REVIEW OF SYSTEMS:   Pain and bleeding fungating breast tumor  PHYSICAL EXAMINATION: ECOG PERFORMANCE STATUS: 2 - Symptomatic, <50% confined to bed  Vitals:   03/18/17 1600 03/18/17 1625  BP: 112/66 104/64  Pulse: 96 100  Resp: 20 18  Temp: 98.5 F (36.9 C) 98.7 F (37.1 C)  SpO2: 100% 100%   Filed Weights   03/18/17 1018  Weight: 106 lb 12.8 oz (48.4 kg)    GENERAL:alert, no distress and comfortable SKIN: skin color, texture, turgor are normal, no rashes or significant lesions EYES: normal, Conjunctiva are pink and non-injected, sclera clear OROPHARYNX:no exudate, no erythema and lips, buccal mucosa, and tongue normal  NECK: supple, thyroid normal size, non-tender, without nodularity LUNGS: clear to auscultation and percussion with normal breathing effort HEART: regular rate & rhythm and no murmurs and no lower extremity edema ABDOMEN:abdomen soft, non-tender and normal bowel sounds Musculoskeletal:no cyanosis of digits and no clubbing  NEURO: alert & oriented x 3, no focal motor/sensory deficits She understands the seriousness of her condition and appears to have insight.  LABORATORY DATA:  I have reviewed the data as listed CMP Latest Ref Rng & Units 03/18/2017 03/15/2017 03/08/2017  Glucose 65 - 99 mg/dL 102(H) 110 128(H)  BUN 6 - 20 mg/dL 11 15.0 15  Creatinine 0.44 - 1.00 mg/dL 0.60 0.7 0.70  Sodium 135 - 145 mmol/L 137 137 136  Potassium 3.5 - 5.1 mmol/L 4.0 3.9 4.1  Chloride 101 - 111 mmol/L 100(L) - 102  CO2 22 - 29 mEq/L - 24 25  Calcium 8.4 - 10.4 mg/dL - 8.7 9.0  Total Protein 6.4 - 8.3 g/dL - 6.4 6.9  Total Bilirubin 0.20  - 1.20 mg/dL - <0.22 0.4  Alkaline Phos 40 - 150 U/L - 165(H) 132(H)  AST 5 - 34 U/L - 31 29  ALT 0 - 55 U/L - 16 17    Lab Results  Component Value Date   WBC 18.3 (H) 03/18/2017   HGB 7.5 (L) 03/18/2017   HCT 22.0 (L) 03/18/2017   MCV 78.4 03/18/2017   PLT 503 (H) 03/18/2017   NEUTROABS 15.7 (H) 03/18/2017    ASSESSMENT AND PLAN: 1. Fungating and bleeding breast tumor: Surgery consulted I discussed with Dr.Hoxworth. There are probably no good surgical options 2. She will need staging scans which we can order as an outpatient 3. Wound care issues: Her biggest need is to have dressing changes done regularly. 4. Severe anemia

## 2017-03-19 ENCOUNTER — Ambulatory Visit: Payer: Self-pay | Admitting: Hematology and Oncology

## 2017-03-19 ENCOUNTER — Other Ambulatory Visit: Payer: Self-pay

## 2017-03-19 ENCOUNTER — Ambulatory Visit: Payer: Self-pay

## 2017-03-19 DIAGNOSIS — N63 Unspecified lump in unspecified breast: Secondary | ICD-10-CM

## 2017-03-19 LAB — TYPE AND SCREEN
ABO/RH(D): A POS
ANTIBODY SCREEN: NEGATIVE
UNIT DIVISION: 0
Unit division: 0

## 2017-03-19 LAB — BASIC METABOLIC PANEL
ANION GAP: 8 (ref 5–15)
BUN: 11 mg/dL (ref 6–20)
CALCIUM: 8.3 mg/dL — AB (ref 8.9–10.3)
CO2: 27 mmol/L (ref 22–32)
Chloride: 102 mmol/L (ref 101–111)
Creatinine, Ser: 0.77 mg/dL (ref 0.44–1.00)
GFR calc Af Amer: >60 mL/min (ref >60)
GLUCOSE: 93 mg/dL (ref 65–99)
Potassium: 4.1 mmol/L (ref 3.5–5.1)
Sodium: 137 mmol/L (ref 135–145)

## 2017-03-19 LAB — BPAM RBC
Blood Product Expiration Date: 201811122359
Blood Product Expiration Date: 201811122359
ISSUE DATE / TIME: 201810251054
ISSUE DATE / TIME: 201810251551
UNIT TYPE AND RH: 6200
Unit Type and Rh: 6200

## 2017-03-19 LAB — CBC
HCT: 27.6 % — ABNORMAL LOW (ref 36.0–46.0)
HEMOGLOBIN: 8.9 g/dL — AB (ref 12.0–15.0)
MCH: 25.4 pg — AB (ref 26.0–34.0)
MCHC: 32.2 g/dL (ref 30.0–36.0)
MCV: 78.9 fL (ref 78.0–100.0)
Platelets: 454 10*3/uL — ABNORMAL HIGH (ref 150–400)
RBC: 3.5 MIL/uL — ABNORMAL LOW (ref 3.87–5.11)
RDW: 16.5 % — AB (ref 11.5–15.5)
WBC: 18.6 10*3/uL — ABNORMAL HIGH (ref 4.0–10.5)

## 2017-03-19 MED ORDER — FENTANYL CITRATE (PF) 100 MCG/2ML IJ SOLN: 50.0000 ug | INTRAMUSCULAR | Status: DC | PRN

## 2017-03-19 MED ORDER — OXYCODONE HCL 5 MG PO TABS
5.0000 mg | ORAL_TABLET | ORAL | Status: DC | PRN
  Administered 2017-03-19 – 2017-03-24 (×26): 10 mg via ORAL
  Filled 2017-03-19 (×26): qty 2

## 2017-03-19 MED ORDER — GABAPENTIN 300 MG PO CAPS
300.0000 mg | ORAL_CAPSULE | Freq: Three times a day (TID) | ORAL | Status: DC
  Administered 2017-03-19 – 2017-03-24 (×17): 300 mg via ORAL
  Filled 2017-03-19 (×17): qty 1

## 2017-03-19 NOTE — Progress Notes (Signed)
[facility] Progress Note     Subjective: CC: bleeding from tumor Patient feels ok, worried that she is still bleeding. Clear drainage present on dressings, not much blood. Patient states that at home she had mostly clear drainage, only had to change dressings once daily and sometimes every other day. Unsure whether she would want to have tumor surgically removed.   Objective: Vital signs in last 24 hours: Temp:  [98.1 F (36.7 C)-99 F (37.2 C)] 99 F (37.2 C) (10/26 0558) Pulse Rate:  [88-101] 88 (10/26 0558) Resp:  [18-20] 18 (10/26 0558) BP: (96-114)/(51-70) 101/61 (10/26 0558) SpO2:  [100 %] 100 % (10/26 0558) Last BM Date: 03/18/17  Intake/Output from previous day: 10/25 0701 - 10/26 0700 In: 1000 [P.O.:240; Blood:310; IV Piggyback:450] Out: -  Intake/Output this shift: Total I/O In: 480 [P.O.:480] Out: -   PE: Gen:  Alert, NAD, pleasant Pulm:  Normal effort Chest: large fungating tumor of left chest wall with necrotic tissue and raw granulation tissue, some oozing, no arterial bleeding  Lab Results:   Recent Labs  03/18/17 0517  03/18/17 2325 03/19/17 0432  WBC 18.3*  --   --  18.6*  HGB 7.2*  < > 9.1* 8.9*  HCT 22.8*  < > 27.7* 27.6*  PLT 503*  --   --  454*  < > = values in this interval not displayed. BMET  Recent Labs  03/18/17 0529 03/19/17 0432  NA 137 137  K 4.0 4.1  CL 100* 102  CO2  --  27  GLUCOSE 102* 93  BUN 11 11  CREATININE 0.60 0.77  CALCIUM  --  8.3*   PT/INR No results for input(s): LABPROT, INR in the last 72 hours. CMP     Component Value Date/Time   NA 137 03/19/2017 0432   NA 137 03/15/2017 1241   K 4.1 03/19/2017 0432   K 3.9 03/15/2017 1241   CL 102 03/19/2017 0432   CO2 27 03/19/2017 0432   CO2 24 03/15/2017 1241   GLUCOSE 93 03/19/2017 0432   GLUCOSE 110 03/15/2017 1241   BUN 11 03/19/2017 0432   BUN 15.0 03/15/2017 1241   CREATININE 0.77 03/19/2017 0432   CREATININE 0.7 03/15/2017 1241   CALCIUM 8.3 (L) 03/19/2017 0432   CALCIUM 8.7 03/15/2017 1241   PROT 6.4 03/15/2017 1241   ALBUMIN 2.3 (L) 03/15/2017 1241   AST 31 03/15/2017 1241   ALT 16 03/15/2017 1241   ALKPHOS 165 (H) 03/15/2017 1241   BILITOT <0.22 03/15/2017 1241   GFRNONAA >60 03/19/2017 0432   GFRAA >60 03/19/2017 0432   Lipase     Component Value Date/Time   LIPASE 29 10/28/2016 1644       Studies/Results: Dg Chest Portable 1 View  Result Date: 03/18/2017 CLINICAL DATA:  LEFT chest pain. EXAM: PORTABLE CHEST 1 VIEW COMPARISON:  Chest radiograph March 08, 2017 and CT chest October 28, 2016 FINDINGS: Limited assessment LEFT lung due to overlying dense LEFT breast mass. Patient rotated to the RIGHT. Cardiomediastinal silhouette is normal. Known pulmonary metastasis better characterized on prior CT. No pleural effusion or focal consolidation. LEFT apical pleural thickening. No pneumothorax. RIGHT PICC distal tip projects the cavoatrial junction. Osseous structures are unchanged. IMPRESSION: No acute cardiopulmonary process. Large LEFT chest wall mass. Stable RIGHT PICC distal tip at cavoatrial junction. Electronically Signed   By: Elon Alas M.D.   On: 03/18/2017 06:35    Anti-infectives: Anti-infectives    Start  Dose/Rate Route Frequency Ordered Stop   03/18/17 1800  vancomycin (VANCOCIN) 500 mg in sodium chloride 0.9 % 100 mL IVPB  Status:  Discontinued     500 mg 100 mL/hr over 60 Minutes Intravenous Every 12 hours 03/18/17 0905 03/18/17 1042   03/18/17 1800  vancomycin (VANCOCIN) IVPB 750 mg/150 ml premix     750 mg 150 mL/hr over 60 Minutes Intravenous Every 12 hours 03/18/17 1042     03/18/17 1200  piperacillin-tazobactam (ZOSYN) IVPB 3.375 g     3.375 g 12.5 mL/hr over 240 Minutes Intravenous Every 8 hours 03/18/17 0905     03/18/17 0600  vancomycin (VANCOCIN) IVPB 1000 mg/200 mL premix     1,000 mg 200 mL/hr over 60 Minutes Intravenous  Once 03/18/17 0556 03/18/17 0756   03/18/17  0600  piperacillin-tazobactam (ZOSYN) IVPB 3.375 g     3.375 g 100 mL/hr over 30 Minutes Intravenous  Once 03/18/17 0556 03/18/17 0657       Assessment/Plan Currently on palliative chemo  Bleeding from fungating stage IV left breast cancer - Hgb 8.9 from 7.2 - s/p 2U PRBC - BP soft but stable - continue daily dressing changes with vaseline gauze or xeroform to raw tissue, plain guaze and ABD pads - can discharge when medically stable, patient may follow up with [facility] office as outpatient if she wants to consider removal of tumor  FEN: regular VTE: SCDs, alteplase x1 10/25 ID: [email or web address] (10/25>>)   LOS: 1 day    Brigid Re , [facility] 03/19/2017, 10:45 AM Pager: [phone] Consults: [phone] Mon-Fri 7:00 am-4:30 pm Sat-Sun 7:00 am-11:30 am

## 2017-03-19 NOTE — Progress Notes (Signed)
PROGRESS NOTE  Ann Oliver KCL:[id number] DOB: [birth year] DOA: 03/18/2017 PCP: [facility]   LOS: 1 day   Brief Narrative / Interim history: Ann Oliver is a 61 y.o. female   with pmh metastatic breast cancer (+BRCA) currently undergoing palliative chemotherapy presents with left side pain.   Assessment & Plan: Principal Problem:   Acute bleeding Active Problems:   Depressed   Breast wound   Acute bleeding of breast fungating tumor  -She is being followed by oncology as an outpatient, currently undergoing chemotherapy. -Due to bleeding, general surgery was consulted, will reevaluate today, for now dressing changes  Acute blood loss anemia -Due to #1, she was transfused 2 units of packed red blood cells on 10/25, hemoglobin improved to 9.1 following transfusions, trending down a little bit of 8.9 this morning.  Continue to closely monitor.  Infected breast wound -Cont vanc and zosyn -Blood cult x2 pending   DVT prophylaxis: SCDs Code Status: Full code Family Communication: No family at bedside Disposition Plan: Home when ready  Consultants:   General surgery  Oncology  Procedures:   None   Antimicrobials:  Vancomycin 10/25 >>  Zosyn 10/25 >>   Subjective: -Complains of pain in her breast, no fever or chills.  No abdominal pain, nausea or vomiting.  Would like her gabapentin resume  Objective: Vitals:   03/18/17 1625 03/18/17 1838 03/18/17 2146 03/19/17 0558  BP: 104/64 114/70 (!) 106/52 101/61  Pulse: 100 96 92 88  Resp: 18 18 20 18   Temp: 98.7 F (37.1 C) 98.2 F (36.8 C) 98.6 F (37 C) 99 F (37.2 C)  TempSrc: Oral Oral Oral Oral  SpO2: 100% 100% 100% 100%  Weight:      Height:        Intake/Output Summary (Last 24 hours) at 03/19/17 0919 Last data filed at 03/19/17 0317  Gross per 24 hour  Intake              800 ml  Output                0 ml  Net              800 ml   Filed Weights   03/18/17 1018    Weight: 48.4 kg (106 lb 12.8 oz)    Examination:  Constitutional: NAD Eyes: lids and conjunctivae normal Respiratory: clear to auscultation bilaterally, no wheezing, no crackles. Normal respiratory effort. No accessory muscle use.  Cardiovascular: Regular rate and rhythm, no murmurs / rubs / gallops. No LE edema. 2+ pedal pulses. No carotid bruits.  Abdomen: no tenderness. Bowel sounds positive.  Musculoskeletal: no clubbing / cyanosis.  Neurologic: CN 2-12 grossly intact. Strength 5/5 in all 4.   Data Reviewed: I have independently reviewed following labs and imaging studies  CBC:  Recent Labs Lab 03/15/17 1241 03/18/17 0517 03/18/17 0529 03/18/17 2325 03/19/17 0432  WBC 14.3* 18.3*  --   --  18.6*  NEUTROABS 12.4* 15.7*  --   --   --   HGB 8.1* 7.2* 7.5* 9.1* 8.9*  HCT 26.0* 22.8* 22.0* 27.7* 27.6*  MCV 76.9* 78.4  --   --  78.9  PLT 601* 503*  --   --  494*   Basic Metabolic Panel:  Recent Labs Lab 03/15/17 1241 03/18/17 0529 03/19/17 0432  NA 137 137 137  K 3.9 4.0 4.1  CL  --  100* 102  CO2 24  --  27  GLUCOSE 110 102*  93  BUN 15.0 11 11  CREATININE 0.7 0.60 0.77  CALCIUM 8.7  --  8.3*   GFR: Estimated Creatinine Clearance: 55 mL/min (by C-G formula based on SCr of 0.77 mg/dL). Liver Function Tests:  Recent Labs Lab 03/15/17 1241  AST 31  ALT 16  ALKPHOS 165*  BILITOT <0.22  PROT 6.4  ALBUMIN 2.3*   No results for input(s): LIPASE, AMYLASE in the last 168 hours. No results for input(s): AMMONIA in the last 168 hours. Coagulation Profile: No results for input(s): INR, PROTIME in the last 168 hours. Cardiac Enzymes: No results for input(s): CKTOTAL, CKMB, CKMBINDEX, TROPONINI in the last 168 hours. BNP (last 3 results) No results for input(s): PROBNP in the last 8760 hours. HbA1C: No results for input(s): HGBA1C in the last 72 hours. CBG: No results for input(s): GLUCAP in the last 168 hours. Lipid Profile: No results for input(s): CHOL,  HDL, LDLCALC, TRIG, CHOLHDL, LDLDIRECT in the last 72 hours. Thyroid Function Tests: No results for input(s): TSH, T4TOTAL, FREET4, T3FREE, THYROIDAB in the last 72 hours. Anemia Panel: No results for input(s): VITAMINB12, FOLATE, FERRITIN, TIBC, IRON, RETICCTPCT in the last 72 hours. Urine analysis:    Component Value Date/Time   COLORURINE YELLOW 09/25/2016 0230   APPEARANCEUR CLEAR 09/25/2016 0230   LABSPEC 1.019 09/25/2016 0230   PHURINE 5.0 09/25/2016 0230   GLUCOSEU NEGATIVE 09/25/2016 0230   HGBUR NEGATIVE 09/25/2016 0230   BILIRUBINUR NEGATIVE 09/25/2016 0230   KETONESUR 5 (A) 09/25/2016 0230   PROTEINUR NEGATIVE 09/25/2016 0230   NITRITE NEGATIVE 09/25/2016 0230   LEUKOCYTESUR NEGATIVE 09/25/2016 0230   Sepsis Labs: Invalid input(s): PROCALCITONIN, LACTICIDVEN  No results found for this or any previous visit (from the past 240 hour(s)).    Radiology Studies: Dg Chest Portable 1 View  Result Date: 03/18/2017 CLINICAL DATA:  LEFT chest pain. EXAM: PORTABLE CHEST 1 VIEW COMPARISON:  Chest radiograph March 08, 2017 and CT chest October 28, 2016 FINDINGS: Limited assessment LEFT lung due to overlying dense LEFT breast mass. Patient rotated to the RIGHT. Cardiomediastinal silhouette is normal. Known pulmonary metastasis better characterized on prior CT. No pleural effusion or focal consolidation. LEFT apical pleural thickening. No pneumothorax. RIGHT PICC distal tip projects the cavoatrial junction. Osseous structures are unchanged. IMPRESSION: No acute cardiopulmonary process. Large LEFT chest wall mass. Stable RIGHT PICC distal tip at cavoatrial junction. Electronically Signed   By: Elon Alas M.D.   On: 03/18/2017 06:35     Scheduled Meds: . gabapentin  300 mg Oral TID  . sodium chloride flush  3 mL Intravenous Q12H   Continuous Infusions: . sodium chloride    . piperacillin-tazobactam (ZOSYN)  IV Stopped (03/19/17 0519)  . vancomycin Stopped (03/19/17 5573)     Marzetta Board, MD, PhD [facility] Pager [phone]  If 7PM-7AM, please contact night-coverage [email or web address] Password [email or web address] 03/19/2017, 9:19 AM

## 2017-03-19 NOTE — Care Management Note (Signed)
Case Management Note  Patient Details  Name: Ann Oliver MRN: [id number] Date of Birth: [birth year]  Subjective/Objective:                  bleeding and painful fungating tumor  Action/Plan: Date:  March 19, 2017 Chart reviewed for concurrent status and case management needs.  Will continue to follow patient progress.  Discharge Planning: following for needs  Expected discharge date: [id number]  Velva Harman, BSN, [place], [place]   Expected Discharge Date:   (UNKNOWN)               Expected Discharge Plan:  Home/Self Care  In-House Referral:     Discharge planning Services  CM Consult  Post Acute Care Choice:    Choice offered to:     DME Arranged:    DME Agency:     HH Arranged:    HH Agency:     Status of Service:  In process, will continue to follow  If discussed at Long Length of Stay Meetings, dates discussed:    Additional Comments:  Leeroy Cha, RN 03/19/2017, 8:42 AM

## 2017-03-19 NOTE — Progress Notes (Signed)
   03/19/17 1500  Clinical Encounter Type  Visited With Patient  Visit Type Follow-up;Psychological support;Spiritual support  Referral From Chaplain  Consult/Referral To Chaplain  Spiritual Encounters  Spiritual Needs Emotional;Other (Comment) (Spiritual Conversation/Support)  Stress Factors  Patient Stress Factors Health changes;Financial concerns   I visited with the patient as a follow-up to previous encounters. The patient has been cared for by myself and the [place], Lorrin Jackson.  The patient has high needs and stated those during our conversation. Ms. Boehne was concerned about "saving money" on food. She has been given food from the [facility] in the past and clothes from the [facility] [employer]. These should only be accessed if the patient lacks clothing and lacks the ability to get food. More long-term solutions should be sought out if the patient hasn't already accessed those routes.  It is unclear whether the patient has good support; but claims that she has someone where she lives that helps her.   Please, contact Spiritual Care for further assistance.   [place] M.Div.

## 2017-03-20 LAB — CBC
HEMATOCRIT: 28.3 % — AB (ref 36.0–46.0)
HEMOGLOBIN: 9 g/dL — AB (ref 12.0–15.0)
MCH: 25.4 pg — AB (ref 26.0–34.0)
MCHC: 31.8 g/dL (ref 30.0–36.0)
MCV: 79.9 fL (ref 78.0–100.0)
Platelets: 447 10*3/uL — ABNORMAL HIGH (ref 150–400)
RBC: 3.54 MIL/uL — ABNORMAL LOW (ref 3.87–5.11)
RDW: 17 % — ABNORMAL HIGH (ref 11.5–15.5)
WBC: 17.9 10*3/uL — ABNORMAL HIGH (ref 4.0–10.5)

## 2017-03-20 LAB — BASIC METABOLIC PANEL
ANION GAP: 8 (ref 5–15)
BUN: 9 mg/dL (ref 6–20)
CALCIUM: 8.6 mg/dL — AB (ref 8.9–10.3)
CO2: 28 mmol/L (ref 22–32)
Chloride: 104 mmol/L (ref 101–111)
Creatinine, Ser: 0.65 mg/dL (ref 0.44–1.00)
GFR calc Af Amer: >60 mL/min (ref >60)
GFR calc non Af Amer: >60 mL/min (ref >60)
GLUCOSE: 97 mg/dL (ref 65–99)
Potassium: 4.2 mmol/L (ref 3.5–5.1)
Sodium: 140 mmol/L (ref 135–145)

## 2017-03-20 NOTE — Progress Notes (Addendum)
Patient ID: Ann Oliver, female   DOB: 1955-06-17, 61 y.o.   MRN: [id number]  PROGRESS NOTE    Ann Oliver  NLZ:[id number] DOB: Oct 29, 1955 DOA: 03/18/2017  PCP: [facility]   Brief Narrative:  61 year old female with metastatic breast cancer (+BRCA), undergoing palliative chemotherapy, she presented to hospital with pain over the left side of the chest at the location of the breast wound and bleeding from the fungating tumor.    Assessment & Plan:   Principal Problem:   Acute bleeding from the fungating breast tumor / Leukocytosis  - Seen by general surgery - Per surgery, tumor could possibly be resected but it would be difficult to close the wound entirely. If pt considers surgery then she will have to be referred to surgery once she completes her current course of chemo and re- imaging - S/P 2 U PRBC transfusion during this hospital stay  - Continue vanco and zosyn - Continue pain management efforts   Active Problems:   Anemia of chronic disease - Due to malignancy - Monitor daily CBC   DVT prophylaxis: SCD's Code Status: full code  Family Communication: sitter at the bedside  Disposition Plan: home once cleared by surgery    Consultants:   Surgery   Oncology, Dr. Nicholas Lose  Procedures:   None  Antimicrobials:   Vanco and zosyn -->   Subjective: No overnight events.  Objective: Vitals:   03/20/17 0556 03/20/17 0600 03/20/17 1000 03/20/17 1428  BP:  (!) 95/51 (!) 98/51 (!) 85/72  Pulse:  91 96 95  Resp:  18  18  Temp:  99 F (37.2 C) 98.9 F (37.2 C) 98.2 F (36.8 C)  TempSrc:  Oral Oral Oral  SpO2:  98% 100% 100%  Weight: 48.1 kg (106 lb)  48.1 kg (106 lb 0.7 oz)   Height:        Intake/Output Summary (Last 24 hours) at 03/20/17 1658 Last data filed at 03/20/17 1606  Gross per 24 hour  Intake             1440 ml  Output                0 ml  Net             1440 ml   Filed Weights   03/18/17 1018 03/20/17  0556 03/20/17 1000  Weight: 48.4 kg (106 lb 12.8 oz) 48.1 kg (106 lb) 48.1 kg (106 lb 0.7 oz)    Examination:  General exam: Appears calm and comfortable  Respiratory system: Clear to auscultation. Respiratory effort normal. Cardiovascular system: S1 & S2 heard, RRR. Gastrointestinal system: Abdomen is nondistended, soft and nontender. No organomegaly or masses felt. Normal bowel sounds heard. Central nervous system: Alert and oriented. No focal neurological deficits. Extremities: Symmetric 5 x 5 power. Skin: breast wound, dressing in place  Psychiatry: Judgement and insight appear normal. Mood & affect appropriate.   Data Reviewed: I have personally reviewed following labs and imaging studies  CBC:  Recent Labs Lab 03/15/17 1241 03/18/17 0517 03/18/17 0529 03/18/17 2325 03/19/17 0432 03/20/17 0340  WBC 14.3* 18.3*  --   --  18.6* 17.9*  NEUTROABS 12.4* 15.7*  --   --   --   --   HGB 8.1* 7.2* 7.5* 9.1* 8.9* 9.0*  HCT 26.0* 22.8* 22.0* 27.7* 27.6* 28.3*  MCV 76.9* 78.4  --   --  78.9 79.9  PLT 601* 503*  --   --  454* 716*   Basic Metabolic Panel:  Recent Labs Lab 03/15/17 1241 03/18/17 0529 03/19/17 0432 03/20/17 0340  NA 137 137 137 140  K 3.9 4.0 4.1 4.2  CL  --  100* 102 104  CO2 24  --  27 28  GLUCOSE 110 102* 93 97  BUN 15.0 11 11 9   CREATININE 0.7 0.60 0.77 0.65  CALCIUM 8.7  --  8.3* 8.6*   GFR: Estimated Creatinine Clearance: 55 mL/min (by C-G formula based on SCr of 0.65 mg/dL). Liver Function Tests:  Recent Labs Lab 03/15/17 1241  AST 31  ALT 16  ALKPHOS 165*  BILITOT <0.22  PROT 6.4  ALBUMIN 2.3*   No results for input(s): LIPASE, AMYLASE in the last 168 hours. No results for input(s): AMMONIA in the last 168 hours. Coagulation Profile: No results for input(s): INR, PROTIME in the last 168 hours. Cardiac Enzymes: No results for input(s): CKTOTAL, CKMB, CKMBINDEX, TROPONINI in the last 168 hours. BNP (last 3 results) No results for  input(s): PROBNP in the last 8760 hours. HbA1C: No results for input(s): HGBA1C in the last 72 hours. CBG: No results for input(s): GLUCAP in the last 168 hours. Lipid Profile: No results for input(s): CHOL, HDL, LDLCALC, TRIG, CHOLHDL, LDLDIRECT in the last 72 hours. Thyroid Function Tests: No results for input(s): TSH, T4TOTAL, FREET4, T3FREE, THYROIDAB in the last 72 hours. Anemia Panel: No results for input(s): VITAMINB12, FOLATE, FERRITIN, TIBC, IRON, RETICCTPCT in the last 72 hours. Urine analysis:    Component Value Date/Time   COLORURINE YELLOW 09/25/2016 0230   APPEARANCEUR CLEAR 09/25/2016 0230   LABSPEC 1.019 09/25/2016 0230   PHURINE 5.0 09/25/2016 0230   GLUCOSEU NEGATIVE 09/25/2016 0230   HGBUR NEGATIVE 09/25/2016 0230   BILIRUBINUR NEGATIVE 09/25/2016 0230   KETONESUR 5 (A) 09/25/2016 0230   PROTEINUR NEGATIVE 09/25/2016 0230   NITRITE NEGATIVE 09/25/2016 0230   LEUKOCYTESUR NEGATIVE 09/25/2016 0230   Sepsis Labs: @LABRCNTIP (procalcitonin:4,lacticidven:4)   Recent Results (from the past 240 hour(s))  Culture, blood (routine x 2)     Status: None (Preliminary result)   Collection Time: 03/18/17  7:01 AM  Result Value Ref Range Status   Specimen Description BLOOD LEFT FOREARM  Final   Special Requests   Final    BOTTLES DRAWN AEROBIC AND ANAEROBIC Blood Culture adequate volume   Culture   Final    NO GROWTH 2 DAYS Performed at [facility], [place] [address] [place], [place] [address]    Report Status PENDING  Incomplete  Culture, blood (routine x 2)     Status: None (Preliminary result)   Collection Time: 03/18/17  7:09 AM  Result Value Ref Range Status   Specimen Description BLOOD LEFT ANTECUBITAL  Final   Special Requests   Final    BOTTLES DRAWN AEROBIC AND ANAEROBIC Blood Culture adequate volume   Culture   Final    NO GROWTH 2 DAYS Performed at [facility], [place] [address], [place], [place] [address]    Report Status PENDING  Incomplete       Radiology Studies: Dg Chest Portable 1 View  Result Date: 03/18/2017 CLINICAL DATA:  LEFT chest pain. EXAM: PORTABLE CHEST 1 VIEW COMPARISON:  Chest radiograph March 08, 2017 and CT chest October 28, 2016 FINDINGS: Limited assessment LEFT lung due to overlying dense LEFT breast mass. Patient rotated to the RIGHT. Cardiomediastinal silhouette is normal. Known pulmonary metastasis better characterized on prior CT. No pleural effusion or focal  consolidation. LEFT apical pleural thickening. No pneumothorax. RIGHT PICC distal tip projects the cavoatrial junction. Osseous structures are unchanged. IMPRESSION: No acute cardiopulmonary process. Large LEFT chest wall mass. Stable RIGHT PICC distal tip at cavoatrial junction. Electronically Signed   By: Elon Alas M.D.   On: 03/18/2017 06:35      Scheduled Meds: . gabapentin  300 mg Oral TID   Continuous Infusions: . sodium chloride    . piperacillin-tazobactam (ZOSYN)  IV Stopped (03/20/17 1404)  . vancomycin Stopped (03/20/17 0713)     LOS: 2 days    Time spent: 25 minutes  Greater than 50% of the time spent on counseling and coordinating the care.   Leisa Lenz, MD [facility] Pager [phone]  If 7PM-7AM, please contact night-coverage [email or web address] Password [email or web address] 03/20/2017, 4:58 PM

## 2017-03-21 LAB — BASIC METABOLIC PANEL
Anion gap: 8 (ref 5–15)
BUN: 9 mg/dL (ref 6–20)
CALCIUM: 8.5 mg/dL — AB (ref 8.9–10.3)
CO2: 28 mmol/L (ref 22–32)
CREATININE: 0.78 mg/dL (ref 0.44–1.00)
Chloride: 102 mmol/L (ref 101–111)
GFR calc Af Amer: >60 mL/min (ref >60)
GLUCOSE: 99 mg/dL (ref 65–99)
Potassium: 4.4 mmol/L (ref 3.5–5.1)
SODIUM: 138 mmol/L (ref 135–145)

## 2017-03-21 LAB — CBC
HCT: 28.4 % — ABNORMAL LOW (ref 36.0–46.0)
Hemoglobin: 9 g/dL — ABNORMAL LOW (ref 12.0–15.0)
MCH: 25.7 pg — AB (ref 26.0–34.0)
MCHC: 31.7 g/dL (ref 30.0–36.0)
MCV: 81.1 fL (ref 78.0–100.0)
PLATELETS: 465 10*3/uL — AB (ref 150–400)
RBC: 3.5 MIL/uL — ABNORMAL LOW (ref 3.87–5.11)
RDW: 17.4 % — AB (ref 11.5–15.5)
WBC: 17 10*3/uL — ABNORMAL HIGH (ref 4.0–10.5)

## 2017-03-21 LAB — VANCOMYCIN, TROUGH: Vancomycin Tr: 13 ug/mL — ABNORMAL LOW (ref 15–20)

## 2017-03-21 NOTE — Progress Notes (Signed)
Pharmacy Antibiotic Note  Ann Oliver is a 61 y.o. female admitted on 03/18/2017 with acute bleeding and cellulitis of fungating breast mass. PMH significant for metastatic breast cancer undergoing chemotherapy.  Pharmacy consulted for empiric vancomycin and Zosyn dosing.  Today, 03/21/2017 Vancomycin trough 13 - therapeutic for cellulitis / skin / skin structure infection.  Am concerned that aiming for a higher trough would increase risk of nephrotoxicity. SCr stable Blood cultures from 10/25: no growth to date.   Plan: 1. Continue present vancomycin dosage (750 mg IV q12h). 2. Continue present Zosyn dosage ( 3.375 grams IV q8h as a 4-hour infusion). 3. SCr daily due to potential nephrotoxicity of vancomycin / Zosyn combination therapy. 4. Await word on planned duration of therapy.  Height: 5\' 1"  (154.9 cm) Weight: 116 lb 8 oz (52.8 kg) IBW/kg (Calculated) : 47.8  Temp (24hrs), Avg:98.6 F (37 C), Min:98.2 F (36.8 C), Max:98.9 F (37.2 C)   Recent Labs Lab 03/15/17 1241 03/18/17 0517 03/18/17 0529 03/19/17 0432 03/20/17 0340 03/21/17 0553  WBC 14.3* 18.3*  --  18.6* 17.9* 17.0*  CREATININE 0.7  --  0.60 0.77 0.65 0.78  VANCOTROUGH  --   --   --   --   --  13*    Estimated Creatinine Clearance: 55 mL/min (by C-G formula based on SCr of 0.78 mg/dL).    Allergies  Allergen Reactions  . Aspirin Palpitations    Antimicrobials this admission: 10/24 Vancomycin >>  10/24 Zosyn >>  Dose adjustments this admission:  Microbiology results: 10/25 BCx: ngtd  Thank you for allowing pharmacy to be a part of this patient's care.  Clayburn Pert, PharmD, BCPS Pager: [phone] 03/21/2017  8:05 AM

## 2017-03-21 NOTE — Progress Notes (Signed)
Patient ID: Ann Oliver, female   DOB: 1956/03/13, 61 y.o.   MRN: [id number]  PROGRESS NOTE    Lynsie Mcwatters  XIP:[id number] DOB: 01/27/1956 DOA: 03/18/2017  PCP: [facility]   Brief Narrative:  61 year old female with metastatic breast cancer (+BRCA), undergoing palliative chemotherapy, she presented to hospital with pain over the left side of the chest at the location of the breast wound and bleeding from the fungating tumor.    Assessment & Plan:   Principal Problem:   Acute bleeding from the fungating breast tumor / Leukocytosis  - Per surgery, tumor could possibly be resected but it would be difficult to close the wound entirely. If pt considers surgery then she will have to be referred to surgery once she completes her current course of chemo and re- imaging - status post 2 units PRBC transfusion during this hospital stay - continue vancomycin and Zosyn - Continue present wound care and pain management  Active Problems:   Anemia of chronic disease - secondary to malignancy - hemoglobin 9 this morning   DVT prophylaxis: SCDs Code Status: full code  Family Communication: sitter at bedside  Disposition Plan: home once cleared by surgery   Consultants:   Surgery   Oncology, Dr. Nicholas Lose  Procedures:   none  Antimicrobials:   Vancomycin and Zosyn -->   Subjective: No overnight events.  Objective: Vitals:   03/20/17 2035 03/21/17 0500 03/21/17 0512 03/21/17 0601  BP: (!) 92/54   (!) 97/55  Pulse: 89   95  Resp: 18   18  Temp: 98.7 F (37.1 C)   98.4 F (36.9 C)  TempSrc: Oral   Oral  SpO2: 100%   100%  Weight:  52.8 kg (116 lb 8 oz) 52.8 kg (116 lb 8 oz)   Height:        Intake/Output Summary (Last 24 hours) at 03/21/17 0923 Last data filed at 03/21/17 6734  Gross per 24 hour  Intake             1200 ml  Output                0 ml  Net             1200 ml   Filed Weights   03/20/17 1000 03/21/17 0500 03/21/17  0512  Weight: 48.1 kg (106 lb 0.7 oz) 52.8 kg (116 lb 8 oz) 52.8 kg (116 lb 8 oz)    Physical Exam  Constitutional: Appears well-developed and well-nourished. No distress.  CVS: RRR, S1/S2 + Pulmonary: Effort and breath sounds normal, no stridor, rhonchi, wheezes, rales.  Abdominal: Soft. BS +,  no distension, tenderness, rebound or guarding.  Musculoskeletal: No edema and no tenderness.  Lymphadenopathy: No lymphadenopathy noted, cervical, inguinal. Neuro: Alert. Normal reflexes, muscle tone coordination. No cranial nerve deficit. Skin:dressing over the breast wound in place  Psychiatric: Normal mood and affect.    Data Reviewed: I have personally reviewed following labs and imaging studies  CBC:  Recent Labs Lab 03/15/17 1241  03/18/17 0517 03/18/17 0529 03/18/17 2325 03/19/17 0432 03/20/17 0340 03/21/17 0553  WBC 14.3*  --  18.3*  --   --  18.6* 17.9* 17.0*  NEUTROABS 12.4*  --  15.7*  --   --   --   --   --   HGB 8.1*  < > 7.2* 7.5* 9.1* 8.9* 9.0* 9.0*  HCT 26.0*  < > 22.8* 22.0* 27.7* 27.6* 28.3* 28.4*  MCV 76.9*  --  78.4  --   --  78.9 79.9 81.1  PLT 601*  --  503*  --   --  454* 447* 465*  < > = values in this interval not displayed. Basic Metabolic Panel:  Recent Labs Lab 03/15/17 1241 03/18/17 0529 03/19/17 0432 03/20/17 0340 03/21/17 0553  NA 137 137 137 140 138  K 3.9 4.0 4.1 4.2 4.4  CL  --  100* 102 104 102  CO2 24  --  27 28 28   GLUCOSE 110 102* 93 97 99  BUN 15.0 11 11 9 9   CREATININE 0.7 0.60 0.77 0.65 0.78  CALCIUM 8.7  --  8.3* 8.6* 8.5*   GFR: Estimated Creatinine Clearance: 55 mL/min (by C-G formula based on SCr of 0.78 mg/dL). Liver Function Tests:  Recent Labs Lab 03/15/17 1241  AST 31  ALT 16  ALKPHOS 165*  BILITOT <0.22  PROT 6.4  ALBUMIN 2.3*   No results for input(s): LIPASE, AMYLASE in the last 168 hours. No results for input(s): AMMONIA in the last 168 hours. Coagulation Profile: No results for input(s): INR, PROTIME  in the last 168 hours. Cardiac Enzymes: No results for input(s): CKTOTAL, CKMB, CKMBINDEX, TROPONINI in the last 168 hours. BNP (last 3 results) No results for input(s): PROBNP in the last 8760 hours. HbA1C: No results for input(s): HGBA1C in the last 72 hours. CBG: No results for input(s): GLUCAP in the last 168 hours. Lipid Profile: No results for input(s): CHOL, HDL, LDLCALC, TRIG, CHOLHDL, LDLDIRECT in the last 72 hours. Thyroid Function Tests: No results for input(s): TSH, T4TOTAL, FREET4, T3FREE, THYROIDAB in the last 72 hours. Anemia Panel: No results for input(s): VITAMINB12, FOLATE, FERRITIN, TIBC, IRON, RETICCTPCT in the last 72 hours. Urine analysis:    Component Value Date/Time   COLORURINE YELLOW 09/25/2016 0230   APPEARANCEUR CLEAR 09/25/2016 0230   LABSPEC 1.019 09/25/2016 0230   PHURINE 5.0 09/25/2016 0230   GLUCOSEU NEGATIVE 09/25/2016 0230   HGBUR NEGATIVE 09/25/2016 0230   BILIRUBINUR NEGATIVE 09/25/2016 0230   KETONESUR 5 (A) 09/25/2016 0230   PROTEINUR NEGATIVE 09/25/2016 0230   NITRITE NEGATIVE 09/25/2016 0230   LEUKOCYTESUR NEGATIVE 09/25/2016 0230   Sepsis Labs: @LABRCNTIP (procalcitonin:4,lacticidven:4)   Recent Results (from the past 240 hour(s))  Culture, blood (routine x 2)     Status: None (Preliminary result)   Collection Time: 03/18/17  7:01 AM  Result Value Ref Range Status   Specimen Description BLOOD LEFT FOREARM  Final   Special Requests   Final    BOTTLES DRAWN AEROBIC AND ANAEROBIC Blood Culture adequate volume   Culture   Final    NO GROWTH 2 DAYS Performed at [facility], [place] [address], [place], [place] [address]    Report Status PENDING  Incomplete  Culture, blood (routine x 2)     Status: None (Preliminary result)   Collection Time: 03/18/17  7:09 AM  Result Value Ref Range Status   Specimen Description BLOOD LEFT ANTECUBITAL  Final   Special Requests   Final    BOTTLES DRAWN AEROBIC AND ANAEROBIC Blood Culture  adequate volume   Culture   Final    NO GROWTH 2 DAYS Performed at [facility], [place] [address], [place],  [address]    Report Status PENDING  Incomplete      Radiology Studies: Dg Chest Portable 1 View  Result Date: 03/18/2017 CLINICAL DATA:  LEFT chest pain. EXAM: PORTABLE CHEST 1 VIEW  COMPARISON:  Chest radiograph March 08, 2017 and CT chest October 28, 2016 FINDINGS: Limited assessment LEFT lung due to overlying dense LEFT breast mass. Patient rotated to the RIGHT. Cardiomediastinal silhouette is normal. Known pulmonary metastasis better characterized on prior CT. No pleural effusion or focal consolidation. LEFT apical pleural thickening. No pneumothorax. RIGHT PICC distal tip projects the cavoatrial junction. Osseous structures are unchanged. IMPRESSION: No acute cardiopulmonary process. Large LEFT chest wall mass. Stable RIGHT PICC distal tip at cavoatrial junction. Electronically Signed   By: Elon Alas M.D.   On: 03/18/2017 06:35      Scheduled Meds: . gabapentin  300 mg Oral TID   Continuous Infusions: . sodium chloride    . piperacillin-tazobactam (ZOSYN)  IV Stopped (03/21/17 0545)  . vancomycin 750 mg (03/21/17 0647)     LOS: 3 days    Time spent: 25 minutes  Greater than 50% of the time spent on counseling and coordinating the care.   Leisa Lenz, MD [facility] Pager [phone]  If 7PM-7AM, please contact night-coverage [email or web address] Password [email or web address] 03/21/2017, 9:23 AM

## 2017-03-22 DIAGNOSIS — C799 Secondary malignant neoplasm of unspecified site: Secondary | ICD-10-CM

## 2017-03-22 DIAGNOSIS — Z886 Allergy status to analgesic agent status: Secondary | ICD-10-CM

## 2017-03-22 DIAGNOSIS — C50912 Malignant neoplasm of unspecified site of left female breast: Secondary | ICD-10-CM

## 2017-03-22 DIAGNOSIS — D72828 Other elevated white blood cell count: Secondary | ICD-10-CM

## 2017-03-22 DIAGNOSIS — N179 Acute kidney failure, unspecified: Secondary | ICD-10-CM

## 2017-03-22 LAB — CREATININE, SERUM
CREATININE: 1.3 mg/dL — AB (ref 0.44–1.00)
GFR calc Af Amer: 50 mL/min — ABNORMAL LOW (ref >60)
GFR calc non Af Amer: 43 mL/min — ABNORMAL LOW (ref >60)

## 2017-03-22 LAB — CBC
HEMATOCRIT: 28.1 % — AB (ref 36.0–46.0)
Hemoglobin: 8.7 g/dL — ABNORMAL LOW (ref 12.0–15.0)
MCH: 25.5 pg — AB (ref 26.0–34.0)
MCHC: 31 g/dL (ref 30.0–36.0)
MCV: 82.4 fL (ref 78.0–100.0)
PLATELETS: 526 10*3/uL — AB (ref 150–400)
RBC: 3.41 MIL/uL — ABNORMAL LOW (ref 3.87–5.11)
RDW: 17.5 % — AB (ref 11.5–15.5)
WBC: 22 10*3/uL — ABNORMAL HIGH (ref 4.0–10.5)

## 2017-03-22 MED ORDER — SODIUM CHLORIDE 0.9 % IV SOLN
INTRAVENOUS | Status: DC
Start: 2017-03-22 — End: 2017-03-24
  Administered 2017-03-22 – 2017-03-24 (×3): via INTRAVENOUS

## 2017-03-22 MED ORDER — SODIUM CHLORIDE 0.9 % IV SOLN
INTRAVENOUS | Status: DC
  Filled 2017-03-22: qty 1000

## 2017-03-22 NOTE — Consult Note (Addendum)
[facility]       Reason for Consult:leukocytosis    Referring Physician: Dr. Charlies Silvers  Principal Problem:   Acute bleeding Active Problems:   Depressed   Breast wound   . gabapentin  300 mg Oral TID  . sodium chloride flush  3 mL Intravenous Q12H    Recommendations: Stop vancomycin Stop pip-tazo Local wound care as needed   Assessment: She has a fungating mass of her left breast currently getting chemotherapy and with persistent leukocytosis since September.   She has had no fever, no symptoms concerning for infection on admission or now and has been on broad spectrum antibiotics for 6 days without any change. Admission note indicates 'infected breast wound' but nothing noted on exam on admission or by surgery.  I do not feel this is infected.   Acute renal failure - possibly a medication effect from vanco/pip-tazo in combination.  Stopping as above.    Thanks for consultation, I will sign off but call for any questions or concerns   Antibiotics: vanomycin and zosyn   HPI: Ann Oliver is a 61 y.o. female with history of metastatic breast cancer getting palliative chemotherapy who presented on 10/25 with bleeding to the site as well as suicidal thoughts due to her unfortunate circumstances.  She was also noted to have some leukocytosis and felt to have a wound infection as above but has not noted any pus, surrounding erythema or other changes.  She was started on antibiotics as above and leukocytosis has persisted.  In review of the record, she has had leukocytosis since 9/7 and has fluctuated between 12-24,000.  She has no new symptoms of sob, cough, diarreha, rash, joint complaints.     Review of Systems:  Constitutional: negative for chills and sweats Gastrointestinal: negative for nausea and diarrhea Genitourinary: negative for frequency and dysuria All other systems reviewed and are negative    Past Medical History:  Diagnosis Date    . Cancer ([place])   . Sinusitis   . Tumor cells     Social History  Substance Use Topics  . Smoking status: Never Smoker  . Smokeless tobacco: Never Used  . Alcohol use No    Family History  Problem Relation Age of Onset  . Breast cancer Neg Hx     Allergies  Allergen Reactions  . Aspirin Palpitations    Physical Exam: Constitutional: in no apparent distress  Vitals:   03/22/17 0636 03/22/17 1307  BP: (!) 96/56 109/66  Pulse: (!) 107 90  Resp: 16 16  Temp: 98.7 F (37.1 C) 98.7 F (37.1 C)  SpO2: 94%    EYES: anicteric ENMT: Cardiovascular: Cor RRR Respiratory: CTA B; normal respiratory effort Breast: large fungating mass, no surrounding erythema, no pus GI: Bowel sounds are normal, liver is not enlarged, spleen is not enlarged Musculoskeletal: no pedal edema noted Skin: negatives: no rash Hematologic: no cervical lad  Lab Results  Component Value Date   WBC 22.0 (H) 03/22/2017   HGB 8.7 (L) 03/22/2017   HCT 28.1 (L) 03/22/2017   MCV 82.4 03/22/2017   PLT 526 (H) 03/22/2017    Lab Results  Component Value Date   CREATININE 1.30 (H) 03/22/2017   BUN 9 03/21/2017   NA 138 03/21/2017   K 4.4 03/21/2017   CL 102 03/21/2017   CO2 28 03/21/2017    Lab Results  Component Value Date   ALT 16 03/15/2017   AST 31 03/15/2017  ALKPHOS 165 (H) 03/15/2017     Microbiology: Recent Results (from the past 240 hour(s))  Culture, blood (routine x 2)     Status: None (Preliminary result)   Collection Time: 03/18/17  7:01 AM  Result Value Ref Range Status   Specimen Description BLOOD LEFT FOREARM  Final   Special Requests   Final    BOTTLES DRAWN AEROBIC AND ANAEROBIC Blood Culture adequate volume   Culture   Final    NO GROWTH 4 DAYS Performed at [facility], [address] [place], [place] [address]    Report Status PENDING  Incomplete  Culture, blood (routine x 2)     Status: None (Preliminary result)   Collection Time: 03/18/17  7:09 AM   Result Value Ref Range Status   Specimen Description BLOOD LEFT ANTECUBITAL  Final   Special Requests   Final    BOTTLES DRAWN AEROBIC AND ANAEROBIC Blood Culture adequate volume   Culture   Final    NO GROWTH 4 DAYS Performed at [facility], [place] [address] [place], [place] [address]    Report Status PENDING  Incomplete    Javaun Dimperio, Herbie [place], [facility] [email or web address] [phone] pager  [phone] cell 03/22/2017, 2:07 PM

## 2017-03-22 NOTE — Progress Notes (Signed)
Date:  March 22 2017 Chart reviewed for concurrent status and case management needs.  Will continue to follow patient progress.  Discharge Planning: following for needs  Expected discharge date: March 25, 2017  Maritssa Haughton, BSN, RN3, CCM   [phone]  

## 2017-03-22 NOTE — Progress Notes (Signed)
Patient ID: Ann Oliver, female   DOB: 01-22-1956, 61 y.o.   MRN: [id number]  PROGRESS NOTE    Jisel Fleet  LDJ:[id number] DOB: 1955-10-18 DOA: 03/18/2017  PCP: [facility]   Brief Narrative:  61 year old female with metastatic breast cancer (+BRCA), undergoing palliative chemotherapy, she presented to hospital with pain over the left side of the chest at the location of the breast wound and bleeding from the fungating tumor.   Assessment & Plan:   Principal Problem:   Acute bleeding from the fungating breast tumor / Leukocytosis  - Per surgery, tumor could possibly be resected but it would be difficult to close the wound entirely. If pt considers surgery then she will have to be referred to surgery once she completes her current course of chemo and re- imaging - S/P 2 U PRBC transfusion  - Continue vanco and zosyn  - Continue current wound care and pain management   Active Problems:   Anemia of chronic disease - Due to malignancy - Hgb stable     Acute kidney injury - Due to infection, ?vanco - Monitor daily BMP  DVT prophylaxis: SCD's Code Status: full code  Family Communication: no family at bedside, sitter at bedside  Disposition Plan: risign WBC, not yet stable for discharge    Consultants:   Surgery   Oncology, Dr. Nicholas Lose  ID - question on abx (10/29)  Procedures:   None   Antimicrobials:   Vancomycin and Zosyn -->   Subjective: No overnight events.   Objective: Vitals:   03/21/17 0951 03/21/17 1407 03/21/17 2300 03/22/17 0636  BP:  101/66 (!) 107/53 (!) 96/56  Pulse:  90 93 (!) 107  Resp:  _0 Temp:  (!) 97.1 F (36.2 C) 98.7 F (37.1 C) 98.7 F (37.1 C)  TempSrc:  Oral Oral Oral  SpO2: 97% 99% 97% 94%  Weight:      Height:        Intake/Output Summary (Last 24 hours) at 03/22/17 0901 Last data filed at 03/22/17 0630  Gross per 24 hour  Intake              900 ml  Output                0 ml    Net              900 ml   Filed Weights   03/20/17 1000 03/21/17 0500 03/21/17 0512  Weight: 48.1 kg (106 lb 0.7 oz) 52.8 kg (116 lb 8 oz) 52.8 kg (116 lb 8 oz)   Physical Exam  Constitutional: Appears well-developed and well-nourished. No distress.  CVS: RRR, S1/S2 + Pulmonary: Effort and breath sounds normal, no stridor Abdominal: Soft. BS +,  no distension, tenderness, rebound or guarding.  Musculoskeletal: Normal range of motion. No edema and no tenderness.  Lymphadenopathy: No lymphadenopathy noted, cervical, inguinal. Neuro: Alert. Normal reflexes, muscle tone coordination. No cranial nerve deficit. Skin: breast wound, dressing in place   Psychiatric: Normal mood and affect.   Data Reviewed: I have personally reviewed following labs and imaging studies  CBC:  Recent Labs Lab 03/15/17 1241 03/18/17 0517  03/18/17 2325 03/19/17 0432 03/20/17 0340 03/21/17 0553 03/22/17 0446  WBC 14.3* 18.3*  --   --  18.6* 17.9* 17.0* 22.0*  NEUTROABS 12.4* 15.7*  --   --   --   --   --   --   HGB 8.1* 7.2*  < >  9.1* 8.9* 9.0* 9.0* 8.7*  HCT 26.0* 22.8*  < > 27.7* 27.6* 28.3* 28.4* 28.1*  MCV 76.9* 78.4  --   --  78.9 79.9 81.1 82.4  PLT 601* 503*  --   --  454* 447* 465* 526*  < > = values in this interval not displayed. Basic Metabolic Panel:  Recent Labs Lab 03/15/17 1241 03/18/17 0529 03/19/17 0432 03/20/17 0340 03/21/17 0553 03/22/17 0446  NA 137 137 137 140 138  --   K 3.9 4.0 4.1 4.2 4.4  --   CL  --  100* 102 104 102  --   CO2 24  --  _0 --   GLUCOSE 110 102* 93 97 99  --   BUN 15.0 _1 --   CREATININE 0.7 0.60 0.77 0.65 0.78 1.30*  CALCIUM 8.7  --  8.3* 8.6* 8.5*  --    GFR: Estimated Creatinine Clearance: 33.9 mL/min (A) (by C-G formula based on SCr of 1.3 mg/dL (H)). Liver Function Tests:  Recent Labs Lab 03/15/17 1241  AST 31  ALT 16  ALKPHOS 165*  BILITOT <0.22  PROT 6.4  ALBUMIN 2.3*   No results for input(s): LIPASE, AMYLASE in  the last 168 hours. No results for input(s): AMMONIA in the last 168 hours. Coagulation Profile: No results for input(s): INR, PROTIME in the last 168 hours. Cardiac Enzymes: No results for input(s): CKTOTAL, CKMB, CKMBINDEX, TROPONINI in the last 168 hours. BNP (last 3 results) No results for input(s): PROBNP in the last 8760 hours. HbA1C: No results for input(s): HGBA1C in the last 72 hours. CBG: No results for input(s): GLUCAP in the last 168 hours. Lipid Profile: No results for input(s): CHOL, HDL, LDLCALC, TRIG, CHOLHDL, LDLDIRECT in the last 72 hours. Thyroid Function Tests: No results for input(s): TSH, T4TOTAL, FREET4, T3FREE, THYROIDAB in the last 72 hours. Anemia Panel: No results for input(s): VITAMINB12, FOLATE, FERRITIN, TIBC, IRON, RETICCTPCT in the last 72 hours. Urine analysis:    Component Value Date/Time   COLORURINE YELLOW 09/25/2016 0230   APPEARANCEUR CLEAR 09/25/2016 0230   LABSPEC 1.019 09/25/2016 0230   PHURINE 5.0 09/25/2016 0230   GLUCOSEU NEGATIVE 09/25/2016 0230   HGBUR NEGATIVE 09/25/2016 0230   BILIRUBINUR NEGATIVE 09/25/2016 0230   KETONESUR 5 (A) 09/25/2016 0230   PROTEINUR NEGATIVE 09/25/2016 0230   NITRITE NEGATIVE 09/25/2016 0230   LEUKOCYTESUR NEGATIVE 09/25/2016 0230   Sepsis Labs: _2 (procalcitonin:4,lacticidven:4)   Recent Results (from the past 240 hour(s))  Culture, blood (routine x 2)     Status: None (Preliminary result)   Collection Time: 03/18/17  7:01 AM  Result Value Ref Range Status   Specimen Description BLOOD LEFT FOREARM  Final   Special Requests   Final    BOTTLES DRAWN AEROBIC AND ANAEROBIC Blood Culture adequate volume   Culture   Final    NO GROWTH 3 DAYS Performed at [facility], [place] [address], [place], [place] [address]    Report Status PENDING  Incomplete  Culture, blood (routine x 2)     Status: None (Preliminary result)   Collection Time: 03/18/17  7:09 AM  Result Value Ref Range Status    Specimen Description BLOOD LEFT ANTECUBITAL  Final   Special Requests   Final    BOTTLES DRAWN AEROBIC AND ANAEROBIC Blood Culture adequate volume   Culture   Final    NO GROWTH 3 DAYS Performed at [facility], [place]  [address], [place], [place] [address]    Report Status PENDING  Incomplete      Radiology Studies: No results found.    Scheduled Meds: . gabapentin  300 mg Oral TID   Continuous Infusions: . sodium chloride    . piperacillin-tazobactam (ZOSYN)  IV Stopped (03/22/17 0700)  . vancomycin Stopped (03/22/17 0730)     LOS: 4 days    Time spent: 25 minutes  Greater than 50% of the time spent on counseling and coordinating the care.   Leisa Lenz, MD [facility] Pager [phone]  If 7PM-7AM, please contact night-coverage [email or web address] Password [email or web address] 03/22/2017, 9:01 AM

## 2017-03-23 DIAGNOSIS — C50911 Malignant neoplasm of unspecified site of right female breast: Secondary | ICD-10-CM

## 2017-03-23 DIAGNOSIS — R58 Hemorrhage, not elsewhere classified: Secondary | ICD-10-CM

## 2017-03-23 DIAGNOSIS — F0631 Mood disorder due to known physiological condition with depressive features: Secondary | ICD-10-CM

## 2017-03-23 LAB — CBC
HCT: 26.4 % — ABNORMAL LOW (ref 36.0–46.0)
Hemoglobin: 8.1 g/dL — ABNORMAL LOW (ref 12.0–15.0)
MCH: 25.6 pg — ABNORMAL LOW (ref 26.0–34.0)
MCHC: 30.7 g/dL (ref 30.0–36.0)
MCV: 83.3 fL (ref 78.0–100.0)
Platelets: 465 10*3/uL — ABNORMAL HIGH (ref 150–400)
RBC: 3.17 MIL/uL — ABNORMAL LOW (ref 3.87–5.11)
RDW: 17.6 % — AB (ref 11.5–15.5)
WBC: 18.7 10*3/uL — ABNORMAL HIGH (ref 4.0–10.5)

## 2017-03-23 LAB — BASIC METABOLIC PANEL
ANION GAP: 9 (ref 5–15)
BUN: 13 mg/dL (ref 6–20)
CALCIUM: 8.1 mg/dL — AB (ref 8.9–10.3)
CO2: 27 mmol/L (ref 22–32)
CREATININE: 1.6 mg/dL — AB (ref 0.44–1.00)
Chloride: 107 mmol/L (ref 101–111)
GFR, EST AFRICAN AMERICAN: 39 mL/min — AB (ref >60)
GFR, EST NON AFRICAN AMERICAN: 33 mL/min — AB (ref >60)
Glucose, Bld: 96 mg/dL (ref 65–99)
Potassium: 4.1 mmol/L (ref 3.5–5.1)
SODIUM: 143 mmol/L (ref 135–145)

## 2017-03-23 LAB — CULTURE, BLOOD (ROUTINE X 2)
CULTURE: NO GROWTH
Culture: NO GROWTH
Special Requests: ADEQUATE
Special Requests: ADEQUATE

## 2017-03-23 MED ORDER — LIP MEDEX EX OINT
TOPICAL_OINTMENT | CUTANEOUS | Status: AC
  Administered 2017-03-23: 12:00:00
  Filled 2017-03-23: qty 7

## 2017-03-23 MED ORDER — MIRTAZAPINE 15 MG PO TBDP
15.0000 mg | ORAL_TABLET | Freq: Every day | ORAL | Status: DC
  Administered 2017-03-23: 15 mg via ORAL
  Filled 2017-03-23 (×2): qty 1

## 2017-03-23 NOTE — Consult Note (Signed)
[facility] Consult   Reason for Consult:  Depression and suicide ideation Referring Physician:  Dr. Charlies Silvers Patient Identification: Ann Oliver MRN:  [id number] Principal Diagnosis: Acute bleeding Diagnosis:   Patient Active Problem List   Diagnosis Date Noted  . Acute bleeding [R58] 03/18/2017  . Breast wound [S21.009A] 03/18/2017  . Blood loss anemia [D50.0] 01/18/2017  . Port catheter in place [facility] 12/02/2016  . Necrotizing soft tissue infection [M79.89] 11/21/2016  . Cellulitis [L03.90] 10/28/2016  . Wound infection [T14.8XXA, L08.9] 10/28/2016  . Cellulitis of left breast [N61.0] 10/28/2016  . Encounter for palliative care [Z51.5]   . Goals of care, counseling/discussion [Z71.89]   . 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 Malnutrition of moderate degree [E44.0] 09/25/2016  . Breast cancer of upper-outer quadrant of left female breast ([place]) [C50.412] 10/22/2015  . THYROID NODULE, RIGHT [E04.1] 05/31/2007  . Depressed [F32.9] 05/31/2007  . GERD [K21.9] 05/31/2007  . HOT FLASHES [N95.1] 05/31/2007  . HEADACHE [R51] 05/31/2007    Total Time spent with patient: 45 minutes  Subjective:   Ann Oliver is a 61 y.o. female patient admitted with pain and bleeding due to metastatic breast cancer.  HPI:  Ann Oliver is a 61 y.o. female with history of metastatic breast cancer getting palliative chemotherapy who presented on 10/25 with bleeding to the site as well as suicidal thoughts due to her unfortunate circumstances.    Patient seen face-to-face for the psychiatric consultation and evaluation for increased symptoms of depression, feeling isolated, withdrawn feels like nobody cared for her occasional and suicidal thoughts.  Patient reported she has been depressed because of poor pain associated with her metastatic breast cancer.  Staff RN reported patient stated if she does not get enough pain control she might  think about hurting herself.,  Pleasant and talkative during my evaluation. Patient denies current suicidal or homicidal ideation and has no evidence of psychosis.  Patient has been compliant with her treatment in the hospital.  Patient lives with her son who is supportive to her.  Patient is calm and cooperative   Past Psychiatric History: Depression and reportedly does not take any medication and concern about side effect of the medication.  Risk to Self: Is patient at risk for suicide?: No Risk to Others:   Prior Inpatient Therapy:   Prior Outpatient Therapy:    Past Medical History:  Past Medical History:  Diagnosis Date  . Cancer ([place])   . Sinusitis   . Tumor cells     Past Surgical History:  Procedure Laterality Date  . BIOPSY BREAST    . IR FLUORO GUIDE CV LINE RIGHT  11/06/2016  . IR US GUIDE VASC ACCESS RIGHT  11/06/2016   Family History:  Family History  Problem Relation Age of Onset  . Breast cancer Neg Hx    Family Psychiatric  History: Denied family history of mental illness.  History  Drug Use No    Social History   Social History  . Marital status: Single    Spouse name: N/A  . Number of children: N/A  . Years of education: N/A   Social History Main Topics  . Smoking status: Never Smoker  . Smokeless tobacco: Never Used  . Alcohol use No  . Drug use: No  . Sexual activity: Not Asked   Other Topics Concern  . None   Social History Narrative  . None   Additional Social History:    Allergies:  Allergies  Allergen Reactions  . Aspirin Palpitations    Labs:  Results for orders placed or performed during the hospital encounter of 03/18/17 (from the past 48 hour(s))  Creatinine, serum     Status: Abnormal   Collection Time: 03/22/17  4:46 AM  Result Value Ref Range   Creatinine, Ser 1.30 (H) 0.44 - 1.00 mg/dL   GFR calc non Af Amer 43 (L) >60 mL/min   GFR calc Af Amer 50 (L) >60 mL/min    Comment: (NOTE) The eGFR has been calculated using  the CKD EPI equation. This calculation has not been validated in all clinical situations. eGFR's persistently <60 mL/min signify possible Chronic Kidney Disease.   CBC     Status: Abnormal   Collection Time: 03/22/17  4:46 AM  Result Value Ref Range   WBC 22.0 (H) 4.0 - 10.5 K/uL   RBC 3.41 (L) 3.87 - 5.11 MIL/uL   Hemoglobin 8.7 (L) 12.0 - 15.0 g/dL   HCT 28.1 (L) 36.0 - 46.0 %   MCV 82.4 78.0 - 100.0 fL   MCH 25.5 (L) 26.0 - 34.0 pg   MCHC 31.0 30.0 - 36.0 g/dL   RDW 17.5 (H) 11.5 - 15.5 %   Platelets 526 (H) 150 - 400 K/uL  CBC     Status: Abnormal   Collection Time: 03/23/17  3:58 AM  Result Value Ref Range   WBC 18.7 (H) 4.0 - 10.5 K/uL   RBC 3.17 (L) 3.87 - 5.11 MIL/uL   Hemoglobin 8.1 (L) 12.0 - 15.0 g/dL   HCT 26.4 (L) 36.0 - 46.0 %   MCV 83.3 78.0 - 100.0 fL   MCH 25.6 (L) 26.0 - 34.0 pg   MCHC 30.7 30.0 - 36.0 g/dL   RDW 17.6 (H) 11.5 - 15.5 %   Platelets 465 (H) 150 - 400 K/uL  Basic metabolic panel     Status: Abnormal   Collection Time: 03/23/17  3:58 AM  Result Value Ref Range   Sodium 143 135 - 145 mmol/L   Potassium 4.1 3.5 - 5.1 mmol/L   Chloride 107 101 - 111 mmol/L   CO2 27 22 - 32 mmol/L   Glucose, Bld 96 65 - 99 mg/dL   BUN 13 6 - 20 mg/dL   Creatinine, Ser 1.60 (H) 0.44 - 1.00 mg/dL   Calcium 8.1 (L) 8.9 - 10.3 mg/dL   GFR calc non Af Amer 33 (L) >60 mL/min   GFR calc Af Amer 39 (L) >60 mL/min    Comment: (NOTE) The eGFR has been calculated using the CKD EPI equation. This calculation has not been validated in all clinical situations. eGFR's persistently <60 mL/min signify possible Chronic Kidney Disease.    Anion gap 9 5 - 15    Current Facility-Administered Medications  Medication Dose Route Frequency Provider Last Rate Last Dose  . 0.9 %  sodium chloride infusion   Intravenous Continuous Robbie Lis, MD 50 mL/hr at 03/22/17 1444    . acetaminophen (TYLENOL) tablet 650 mg  650 mg Oral Q6H PRN Elwin Mocha, MD       Or  .  acetaminophen (TYLENOL) suppository 650 mg  650 mg Rectal Q6H PRN Elwin Mocha, MD      . gabapentin (NEURONTIN) capsule 300 mg  300 mg Oral TID Caren Griffins, MD   300 mg at 03/23/17 0957  . HYDROmorphone (DILAUDID) injection 1 mg  1 mg Intravenous Q4H PRN Elwin Mocha, MD  1 mg at 03/23/17 0957  . ondansetron (ZOFRAN) tablet 4 mg  4 mg Oral Q6H PRN Elwin Mocha, MD   4 mg at 03/22/17 0308   Or  . ondansetron [facility]) injection 4 mg  4 mg Intravenous Q6H PRN Elwin Mocha, MD   4 mg at 03/21/17 1723  . oxyCODONE (Oxy IR/ROXICODONE) immediate release tablet 5-10 mg  5-10 mg Oral Q4H PRN Caren Griffins, MD   10 mg at 03/23/17 0546  . sodium chloride flush (NS) 0.9 % injection 10-40 mL  10-40 mL Intracatheter PRN Elwin Mocha, MD   10 mL at 03/23/17 0400  . sodium chloride flush (NS) 0.9 % injection 3 mL  3 mL Intravenous Q12H Elwin Mocha, MD   3 mL at 03/22/17 2124   Facility-Administered Medications Ordered in Other Encounters  Medication Dose Route Frequency Provider Last Rate Last Dose  . alteplase (CATHFLO ACTIVASE) injection 2 mg  2 mg Intracatheter Once Nicholas Lose, MD      . sodium chloride flush (NS) 0.9 % injection 10 mL  10 mL Intracatheter PRN Nicholas Lose, MD   10 mL at 12/25/16 1702    Musculoskeletal: Strength & Muscle Tone: within normal limits Gait & Station: unable to stand Patient leans: N/A  Psychiatric Specialty Exam: Physical Exam as per history and physical  ROS complaining about pain in her  left chest area and sleep disturbance feeling depressed.  Patient denied nausea, vomiting, abdominal pain and shortness of breath and chest pain. No Fever-chills, No Headache, No changes with Vision or hearing, reports vertigo No problems swallowing food or Liquids, No Chest pain, Cough or Shortness of Breath, No Abdominal pain, No Nausea or Vommitting, Bowel movements are regular, No Blood in stool or Urine, No dysuria, No new skin rashes or  bruises, No new joints pains-aches,  No new weakness, tingling, numbness in any extremity, No recent weight gain or loss, No polyuria, polydypsia or polyphagia,  A full 10 point Review of Systems was done, except as stated above, all other Review of Systems were negative.  Blood pressure (!) 107/56, pulse 90, temperature 98.3 F (36.8 C), temperature source Oral, resp. rate 16, height 5' 1"  (1.549 m), weight 52.8 kg (116 lb 8 oz), SpO2 96 %.Body mass index is 22.01 kg/m.  General Appearance: Casual  Eye Contact:  Good  Speech:  Clear and Coherent  Volume:  Normal  Mood:  Depressed  Affect:  Appropriate and Congruent  Thought Process:  Coherent and Goal Directed  Orientation:  Full (Time, Place, and Person)  Thought Content:  WDL and Rumination  Suicidal Thoughts:  No  Homicidal Thoughts:  No  Memory:  Immediate;   Fair Recent;   Fair Remote;   Fair  Judgement:  Intact  Insight:  Fair  Psychomotor Activity:  Normal  Concentration:  Concentration: Fair and Attention Span: Fair  Recall:  Good  Fund of Knowledge:  Good  Language:  Good  Akathisia:  Negative  Handed:  Right  AIMS (if indicated):     Assets:  Communication Skills Desire for Improvement Financial Resources/Insurance Housing Leisure Time Resilience Social Support Transportation  ADL's:  Intact  Cognition:  WNL  Sleep:        Treatment Plan Summary: 61 years old female with breast cancer with metastasis currently under chemotherapy and also reportedly feeling some symptoms of depression and occasional suicidal thoughts but denies current suicidal/homicidal ideation, intention or plans.  Patient has no evidence of  psychosis.  Patient contracts for safety during this hospitalization.  Patient is willing to take medication for sleep and will start Remeron.  Patient want to keep thinking about the side effect of the Cymbalta before taking the medication and reportedly does not want to start today.  Depression  secondary to general medical condition   Recommendation: Discontinue sustained safety sitter as patient contracts for safety and has no history of suicidal attempts or inpatient psychiatric hospitalization.  He has no family history of mental illness. We will start Remeron soluble tablet 15 mg at bedtime for depression and insomnia Appreciate psychiatric consultation and follow up as clinically required Please contact [phone] or [phone] if needs further assistance  Disposition: No evidence of imminent risk to self or others at present.   Patient does not meet criteria for psychiatric inpatient admission. Supportive therapy provided about ongoing stressors.  Ambrose Finland, MD 03/23/2017 11:37 AM

## 2017-03-23 NOTE — Progress Notes (Signed)
LCSW will not follow for psych as patient is cleared.   LCSW assessed patient for psychosocial needs based on patient history.  LCSW provided patient with food and clothing resources. Patient reports that she now has stable housing.   LCSW signing off. No further needs at this time.   Ann Oliver [facility] [place]

## 2017-03-23 NOTE — Progress Notes (Addendum)
Patient ID: Ann Oliver, female   DOB: [birth year], 61 y.o.   MRN: [id number]  PROGRESS NOTE    Ann Oliver  VUY:[id number] DOB: Feb 15, 1956 DOA: 03/18/2017  PCP: [facility], [facility]   Brief Narrative:  61 year old female with metastatic breast cancer (+BRCA), undergoing palliative chemotherapy, she presented to hospital with pain over the left side of the chest at the location of the breast wound and bleeding from the fungating tumor. Of note, pt presented suicidal ideations so psych consulted.   Assessment & Plan:   Principal Problem:   Acute bleeding from the fungating breast tumor / Leukocytosis  - Per surgery, tumor could possibly be resected but it would be difficult to close the wound entirely. If pt considers surgery then she will have to be referred to surgery once she completes her current course of chemo and re- imaging - S/P 2 U PRBC transfusion during this hospital stay  - Due to worsening leukocytosis we consulted ID 10/29 - ID did not think wound is infected so abx stopped 10/29 - Continue current wound management and pain management efforts  - WBC count improving, 22 --> 18.7  Active Problems:   Anemia of chronic disease - Due to malignancy  - Hemoglobin stable     Acute kidney injury - Due to infection, ?vanco - Cr 1.30 --> 1.60 - Continue IV fluids - Vanco stopped 10/29 - Follow up BMP in am      Loose stools - Pt had loose stools on 10/29 so C.diff sent    Suicidal ideations - Psych consulted - Sitter at bedside - I asked her about SI and she says "it is my right to end all when I want it". Then she mentions she is feeling better since pain has been controlled     Severe protein calorie malnutrition - In the context of chronic illness - Diet as tolerated    DVT prophylaxis: SCD's Code Status: full code  Family Communication: sitter at bedside, no family at the bedside  Disposition Plan: awaiting psych recommendations     Consultants:   Surgery   Oncology, Dr. Nicholas Lose  ID, Dr. Linus Salmons   Psychiatry   Procedures:   None    Antimicrobials:   Vancomycin and Zosyn stopped 10/29    Subjective: No overnight events.    Objective: Vitals:   03/22/17 0636 03/22/17 1307 03/22/17 2105 03/23/17 0441  BP: (!) 96/56 109/66 (!) 80/50 (!) 107/56  Pulse: (!) 107 90 97 90  Resp: _0 Temp: 98.7 F (37.1 C) 98.7 F (37.1 C) 97.9 F (36.6 C) 98.3 F (36.8 C)  TempSrc: Oral Oral Oral Oral  SpO2: 94%  100% 96%  Weight:      Height:        Intake/Output Summary (Last 24 hours) at 03/23/17 1104 Last data filed at 03/23/17 0902  Gross per 24 hour  Intake           1342.5 ml  Output                3 ml  Net           1339.5 ml   Filed Weights   03/20/17 1000 03/21/17 0500 03/21/17 0512  Weight: 48.1 kg (106 lb 0.7 oz) 52.8 kg (116 lb 8 oz) 52.8 kg (116 lb 8 oz)   Physical Exam  Constitutional: Appears ill, malnourished but no distress  CVS: RRR, S1/S2 + Pulmonary: Effort and breath  sounds normal, no stridor, rhonchi, wheezes, rales.  Abdominal: Soft. BS +,  no distension, tenderness, rebound or guarding.  Musculoskeletal: Normal range of motion. No edema and no tenderness.  Lymphadenopathy: No lymphadenopathy noted, cervical, inguinal. Neuro: Alert. No cranial nerve deficit. Skin: breast wound dressing in place  Psychiatric: Normal mood and affect.    Data Reviewed: I have personally reviewed following labs and imaging studies  CBC:  Recent Labs Lab 03/18/17 0517  03/19/17 0432 03/20/17 0340 03/21/17 0553 03/22/17 0446 03/23/17 0358  WBC 18.3*  --  18.6* 17.9* 17.0* 22.0* 18.7*  NEUTROABS 15.7*  --   --   --   --   --   --   HGB 7.2*  < > 8.9* 9.0* 9.0* 8.7* 8.1*  HCT 22.8*  < > 27.6* 28.3* 28.4* 28.1* 26.4*  MCV 78.4  --  78.9 79.9 81.1 82.4 83.3  PLT 503*  --  454* 447* 465* 526* 465*  < > = values in this interval not displayed. Basic Metabolic Panel:  Recent  Labs Lab 03/18/17 0529 03/19/17 0432 03/20/17 0340 03/21/17 0553 03/22/17 0446 03/23/17 0358  NA 137 137 140 138  --  143  K 4.0 4.1 4.2 4.4  --  4.1  CL 100* 102 104 102  --  107  CO2  --  _0 --  27  GLUCOSE 102* 93 97 99  --  96  BUN _1 --  13  CREATININE 0.60 0.77 0.65 0.78 1.30* 1.60*  CALCIUM  --  8.3* 8.6* 8.5*  --  8.1*   GFR: Estimated Creatinine Clearance: 27.5 mL/min (A) (by C-G formula based on SCr of 1.6 mg/dL (H)). Liver Function Tests: No results for input(s): AST, ALT, ALKPHOS, BILITOT, PROT, ALBUMIN in the last 168 hours. No results for input(s): LIPASE, AMYLASE in the last 168 hours. No results for input(s): AMMONIA in the last 168 hours. Coagulation Profile: No results for input(s): INR, PROTIME in the last 168 hours. Cardiac Enzymes: No results for input(s): CKTOTAL, CKMB, CKMBINDEX, TROPONINI in the last 168 hours. BNP (last 3 results) No results for input(s): PROBNP in the last 8760 hours. HbA1C: No results for input(s): HGBA1C in the last 72 hours. CBG: No results for input(s): GLUCAP in the last 168 hours. Lipid Profile: No results for input(s): CHOL, HDL, LDLCALC, TRIG, CHOLHDL, LDLDIRECT in the last 72 hours. Thyroid Function Tests: No results for input(s): TSH, T4TOTAL, FREET4, T3FREE, THYROIDAB in the last 72 hours. Anemia Panel: No results for input(s): VITAMINB12, FOLATE, FERRITIN, TIBC, IRON, RETICCTPCT in the last 72 hours. Urine analysis:    Component Value Date/Time   COLORURINE YELLOW 09/25/2016 0230   APPEARANCEUR CLEAR 09/25/2016 0230   LABSPEC 1.019 09/25/2016 0230   PHURINE 5.0 09/25/2016 0230   GLUCOSEU NEGATIVE 09/25/2016 0230   HGBUR NEGATIVE 09/25/2016 0230   BILIRUBINUR NEGATIVE 09/25/2016 0230   KETONESUR 5 (A) 09/25/2016 0230   PROTEINUR NEGATIVE 09/25/2016 0230   NITRITE NEGATIVE 09/25/2016 0230   LEUKOCYTESUR NEGATIVE 09/25/2016 0230   Sepsis Labs: _2 (procalcitonin:4,lacticidven:4)   Recent  Results (from the past 240 hour(s))  Culture, blood (routine x 2)     Status: None (Preliminary result)   Collection Time: 03/18/17  7:01 AM  Result Value Ref Range Status   Specimen Description BLOOD LEFT FOREARM  Final   Special Requests   Final    BOTTLES DRAWN AEROBIC AND ANAEROBIC Blood Culture adequate volume   Culture  Final    NO GROWTH 4 DAYS Performed at [facility], [place] [address] [place], [place] [address]    Report Status PENDING  Incomplete  Culture, blood (routine x 2)     Status: None (Preliminary result)   Collection Time: 03/18/17  7:09 AM  Result Value Ref Range Status   Specimen Description BLOOD LEFT ANTECUBITAL  Final   Special Requests   Final    BOTTLES DRAWN AEROBIC AND ANAEROBIC Blood Culture adequate volume   Culture   Final    NO GROWTH 4 DAYS Performed at [facility], [place] [address] [place], [place] [address]    Report Status PENDING  Incomplete      Radiology Studies: No results found.    Scheduled Meds: . gabapentin  300 mg Oral TID   Continuous Infusions: . sodium chloride 50 mL/hr at 03/22/17 1444     LOS: 5 days    Time spent: 25 minutes  Greater than 50% of the time spent on counseling and coordinating the care.   Leisa Lenz, MD [facility] Pager [phone]  If 7PM-7AM, please contact night-coverage [email or web address] Password [email or web address] 03/23/2017, 11:04 AM

## 2017-03-24 LAB — BASIC METABOLIC PANEL
Anion gap: 12 (ref 5–15)
BUN: 11 mg/dL (ref 6–20)
CALCIUM: 8.4 mg/dL — AB (ref 8.9–10.3)
CO2: 23 mmol/L (ref 22–32)
CREATININE: 1.39 mg/dL — AB (ref 0.44–1.00)
Chloride: 106 mmol/L (ref 101–111)
GFR, EST AFRICAN AMERICAN: 46 mL/min — AB (ref >60)
GFR, EST NON AFRICAN AMERICAN: 40 mL/min — AB (ref >60)
Glucose, Bld: 163 mg/dL — ABNORMAL HIGH (ref 65–99)
Potassium: 3.6 mmol/L (ref 3.5–5.1)
SODIUM: 141 mmol/L (ref 135–145)

## 2017-03-24 LAB — CBC
HEMATOCRIT: 29.1 % — AB (ref 36.0–46.0)
HEMOGLOBIN: 8.7 g/dL — AB (ref 12.0–15.0)
MCH: 25.1 pg — AB (ref 26.0–34.0)
MCHC: 29.9 g/dL — AB (ref 30.0–36.0)
MCV: 83.9 fL (ref 78.0–100.0)
PLATELETS: 573 10*3/uL — AB (ref 150–400)
RBC: 3.47 MIL/uL — ABNORMAL LOW (ref 3.87–5.11)
RDW: 17.7 % — AB (ref 11.5–15.5)
WBC: 23.8 10*3/uL — AB (ref 4.0–10.5)

## 2017-03-24 MED ORDER — OXYCODONE-ACETAMINOPHEN 5-325 MG PO TABS: 2.0000 | ORAL_TABLET | ORAL | 0 refills | Status: DC | PRN

## 2017-03-24 MED ORDER — MIRTAZAPINE 15 MG PO TBDP: 15.0000 mg | ORAL_TABLET | Freq: Every day | ORAL | 1 refills | Status: DC

## 2017-03-24 MED ORDER — GABAPENTIN 300 MG PO CAPS: 300.0000 mg | ORAL_CAPSULE | Freq: Three times a day (TID) | ORAL | 0 refills | Status: DC

## 2017-03-24 MED ORDER — HEPARIN SOD (PORK) LOCK FLUSH 100 UNIT/ML IV SOLN
250.0000 [IU] | INTRAVENOUS | Status: AC | PRN
  Administered 2017-03-24: 250 [IU]

## 2017-03-24 NOTE — Progress Notes (Signed)
Patient was discharged to home. Discharge instructions were given to patient and prescriptions. Patient Picc was flushed and capped by IV team RN. Patient wheeled down to entrance to catch an [place].

## 2017-03-24 NOTE — Discharge Summary (Signed)
[facility]  Physician Discharge Summary   Patient ID: Ann Oliver MRN: [id number] DOB/AGE: 62-Feb-1957 62 y.o.  Admit date: 03/18/2017 Discharge date: 03/24/2017  PCP: [facility], [facility]  DISCHARGE DIAGNOSES:  Principal Problem:   Acute bleeding Active Problems:   Depressed   Breast wound   RECOMMENDATIONS FOR OUTPATIENT FOLLOW UP: 1. Patient has an appointment at the cancer center on 11/2  DISCHARGE CONDITION: fair  Diet recommendation: Regular  Filed Weights   03/20/17 1000 03/21/17 0500 03/21/17 0512  Weight: 48.1 kg (106 lb 0.7 oz) 52.8 kg (116 lb 8 oz) 52.8 kg (116 lb 8 oz)    INITIAL HISTORY: 61 year old female with metastatic breast cancer (+BRCA), undergoing palliative chemotherapy, she presented to hospital with pain over the left side of the chest at the location of the breast wound and bleeding from the fungating tumor. Of note, pt mentioned suicidal ideations so psych consulted.   Consultations:  Surgery   Oncology, Dr. Nicholas Lose  ID, Dr. Linus Salmons   Psychiatry     [facility] COURSE:   Acute bleeding from the fungating breast tumor / Leukocytosis/cancer related pain - Per surgery, tumor could possibly be resected but it would be difficult to close the wound entirely. If patient considers surgery then she will have to be referred to surgery once she completes her current course of chemo and re- imaging - S/P 2 U PRBC transfusion during this hospital stay  - Due to worsening leukocytosis consulted ID 10/29. ID did not think wound is infected so abx stopped 10/29 - WBC count remains elevated although she is afebrile. Further follow up at cancer center.  Anemia of chronic disease - Due to malignancy  - Hemoglobin stable posttransfusion.  Acute kidney injury - Due to infection, ?vanco - Cr 1.30 --> 1.60.  Improved to 1.39.  Loose stools -This resolved.  Stool sample could not be sent.  Suicidal ideations - Psych  consulted.  She was started on Remeron.  Cleared by psychiatry.  Severe protein calorie malnutrition - In the context of chronic illness - Diet as tolerated   Okay for discharge home today.  She has a close follow-up appointment at the cancer center on 11/2.  Patient requested pain medications.  The [facility] prescribers database was reviewed.  She was last prescribed pain medications for 15 days on 10/12.  We will give her a prescription for another 15-day supply.  She does have cancer related pain.  PERTINENT LABS:  The results of significant diagnostics from this hospitalization (including imaging, microbiology, ancillary and laboratory) are listed below for reference.    Microbiology: Recent Results (from the past 240 hour(s))  Culture, blood (routine x 2)     Status: None   Collection Time: 03/18/17  7:01 AM  Result Value Ref Range Status   Specimen Description BLOOD LEFT FOREARM  Final   Special Requests   Final    BOTTLES DRAWN AEROBIC AND ANAEROBIC Blood Culture adequate volume   Culture   Final    NO GROWTH 5 DAYS Performed at [facility], [address] [place], [place] [address]    Report Status 03/23/2017 FINAL  Final  Culture, blood (routine x 2)     Status: None   Collection Time: 03/18/17  7:09 AM  Result Value Ref Range Status   Specimen Description BLOOD LEFT ANTECUBITAL  Final   Special Requests   Final    BOTTLES DRAWN AEROBIC AND ANAEROBIC Blood Culture adequate volume  Culture   Final    NO GROWTH 5 DAYS Performed at [facility], [place] [address] [place], [place] [address]    Report Status 03/23/2017 FINAL  Final     Labs: Basic Metabolic Panel:  Recent Labs Lab 03/19/17 0432 03/20/17 0340 03/21/17 0553 03/22/17 0446 03/23/17 0358 03/24/17 0422  NA 137 140 138  --  143 141  K 4.1 4.2 4.4  --  4.1 3.6  CL 102 104 102  --  107 106  CO2 27 28 28   --  27 23  GLUCOSE 93 97 99  --  96 163*  BUN 11 9 9   --  13 11  CREATININE  0.77 0.65 0.78 1.30* 1.60* 1.39*  CALCIUM 8.3* 8.6* 8.5*  --  8.1* 8.4*   CBC:  Recent Labs Lab 03/18/17 0517  03/20/17 0340 03/21/17 0553 03/22/17 0446 03/23/17 0358 03/24/17 0422  WBC 18.3*  < > 17.9* 17.0* 22.0* 18.7* 23.8*  NEUTROABS 15.7*  --   --   --   --   --   --   HGB 7.2*  < > 9.0* 9.0* 8.7* 8.1* 8.7*  HCT 22.8*  < > 28.3* 28.4* 28.1* 26.4* 29.1*  MCV 78.4  < > 79.9 81.1 82.4 83.3 83.9  PLT 503*  < > 447* 465* 526* 465* 573*  < > = values in this interval not displayed.   IMAGING STUDIES Dg Chest 2 View  Result Date: 03/08/2017 CLINICAL DATA:  Leg swelling, increasing for 1 week. EXAM: CHEST  2 VIEW COMPARISON:  11/04/2016 FINDINGS: A new right PICC terminates over the lower SVC. The cardiomediastinal silhouette is within normal limits. Aortic atherosclerosis is noted. Multiple pulmonary nodules are again noted bilaterally, with some of the nodules less well seen than on the prior study suggesting an interval decrease in size. The lungs are slightly less well inflated than on the prior study with minimal opacity in the left lung base, likely atelectasis. No lobar consolidation, edema, pleural effusion, or pneumothorax is identified. Large left breast/chest wall mass is partially visualized. No acute osseous abnormality is seen. IMPRESSION: 1. Mild hypoinflation with minimal left basilar atelectasis. 2. Decreased size of pulmonary metastases. Electronically Signed   By: Logan Bores M.D.   On: 03/08/2017 16:57   Dg Chest Portable 1 View  Result Date: 03/18/2017 CLINICAL DATA:  LEFT chest pain. EXAM: PORTABLE CHEST 1 VIEW COMPARISON:  Chest radiograph March 08, 2017 and CT chest October 28, 2016 FINDINGS: Limited assessment LEFT lung due to overlying dense LEFT breast mass. Patient rotated to the RIGHT. Cardiomediastinal silhouette is normal. Known pulmonary metastasis better characterized on prior CT. No pleural effusion or focal consolidation. LEFT apical pleural thickening.  No pneumothorax. RIGHT PICC distal tip projects the cavoatrial junction. Osseous structures are unchanged. IMPRESSION: No acute cardiopulmonary process. Large LEFT chest wall mass. Stable RIGHT PICC distal tip at cavoatrial junction. Electronically Signed   By: Elon Alas M.D.   On: 03/18/2017 06:35    DISCHARGE EXAMINATION: Vitals:   03/23/17 1420 03/23/17 2044 03/24/17 0519 03/24/17 1452  BP: (!) 118/104 121/66 (!) 114/58 (!) 108/56  Pulse: 95 100 94 (!) 101  Resp: 18 18 18 18   Temp: 98.2 F (36.8 C) 98.6 F (37 C) 98.4 F (36.9 C) 98.8 F (37.1 C)  TempSrc: Oral Oral Oral Oral  SpO2: 100% 100% 100% 100%  Weight:      Height:       General appearance: alert, cooperative,  appears stated age and no distress Resp: clear to auscultation bilaterally Cardio: regular rate and rhythm, S1, S2 normal, no murmur, click, rub or gallop GI: soft, non-tender; bowel sounds normal; no masses,  no organomegaly  DISPOSITION: Home with family  Discharge Instructions    Diet general    Complete by:  As directed    Discharge instructions    Complete by:  As directed    Please take your medications as prescribed.  Seek attention if you develop high fever, worsening pain, nausea vomiting.  You have an appointment at the cancer center on 03/26/17 at 11:30 AM.  Please keep that appointment.  You were cared for by a hospitalist during your hospital stay. If you have any questions about your discharge medications or the care you received while you were in the hospital after you are discharged, you can call the unit and asked to speak with the hospitalist on call if the hospitalist that took care of you is not available. Once you are discharged, your primary care physician will handle any further medical issues. Please note that NO REFILLS for any discharge medications will be authorized once you are discharged, as it is imperative that you return to your primary care physician (or establish a  relationship with a primary care physician if you do not have one) for your aftercare needs so that they can reassess your need for medications and monitor your lab values. If you do not have a primary care physician, you can call [phone] for a physician referral.   Increase activity slowly    Complete by:  As directed       ALLERGIES:  Allergies  Allergen Reactions  . Aspirin Palpitations     Current Discharge Medication List    START taking these medications   Details  mirtazapine (REMERON SOL-TAB) 15 MG disintegrating tablet Take 1 tablet (15 mg total) by mouth at bedtime. Qty: 30 tablet, Refills: 1      CONTINUE these medications which have CHANGED   Details  gabapentin (NEURONTIN) 300 MG capsule Take 1 capsule (300 mg total) by mouth 3 (three) times daily. Qty: 90 capsule, Refills: 0    oxyCODONE-acetaminophen (PERCOCET/ROXICET) 5-325 MG tablet Take 2 tablets by mouth every 4 (four) hours as needed for severe pain. Qty: 180 tablet, Refills: 0      CONTINUE these medications which have NOT CHANGED   Details  feeding supplement, ENSURE ENLIVE, (ENSURE ENLIVE) LIQD Take 237 mLs by mouth 3 (three) times daily between meals. Qty: 30 Bottle, Refills: 0    hydrocortisone (ANUSOL-HC) 25 MG suppository Place 1 suppository (25 mg total) rectally 2 (two) times daily as needed for hemorrhoids or anal itching. 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 Take as directed on package Qty: 30 capsule, Refills: 0    ondansetron (ZOFRAN) 8 MG tablet Take 1 tablet (8 mg total) by mouth every 8 (eight) hours as needed for nausea or vomiting. 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 Call.   Specialty:  General Surgery Why:  Call and make an appointment if you would like to discuss surgical removal of your left chest wall tumor. Contact information: [address] [place] [address] [phone]        Maryanna Shape, NP Follow up on 03/26/2017.   Specialty:  Oncology Why:  11:30Am on 03/26/17 Contact information: [address] [place] [address] [phone]           TOTAL DISCHARGE TIME: 35 mins  [facility] Pager [phone]  03/24/2017, 3:51 PM

## 2017-03-25 ENCOUNTER — Telehealth: Payer: Self-pay

## 2017-03-25 ENCOUNTER — Other Ambulatory Visit: Payer: Self-pay

## 2017-03-25 DIAGNOSIS — C7800 Secondary malignant neoplasm of unspecified lung: Secondary | ICD-10-CM

## 2017-03-25 DIAGNOSIS — C50911 Malignant neoplasm of unspecified site of right female breast: Secondary | ICD-10-CM

## 2017-03-25 DIAGNOSIS — Z170 Estrogen receptor positive status [ER+]: Principal | ICD-10-CM

## 2017-03-25 DIAGNOSIS — C50412 Malignant neoplasm of upper-outer quadrant of left female breast: Secondary | ICD-10-CM

## 2017-03-25 NOTE — Telephone Encounter (Signed)
Pt needs home health reinstated. Will follow up.  Cyndia Bent RN

## 2017-03-25 NOTE — Telephone Encounter (Signed)
Sent new referral for home health to [facility] and called the liaison to make her aware.  Cyndia Bent RN

## 2017-03-26 ENCOUNTER — Inpatient Hospital Stay (HOSPITAL_COMMUNITY)
Admission: EM | Admit: 2017-03-26 | Discharge: 2017-03-30 | DRG: 682 | Disposition: A | Discharge status: Home Health | Payer: Medicaid Other | Attending: Internal Medicine | Admitting: Internal Medicine

## 2017-03-26 ENCOUNTER — Telehealth: Payer: Self-pay

## 2017-03-26 ENCOUNTER — Other Ambulatory Visit: Payer: Self-pay

## 2017-03-26 ENCOUNTER — Ambulatory Visit: Payer: Self-pay

## 2017-03-26 ENCOUNTER — Ambulatory Visit: Payer: Self-pay | Admitting: Oncology

## 2017-03-26 ENCOUNTER — Encounter (HOSPITAL_COMMUNITY): Payer: Self-pay

## 2017-03-26 DIAGNOSIS — T451X5A Adverse effect of antineoplastic and immunosuppressive drugs, initial encounter: Secondary | ICD-10-CM | POA: Diagnosis present

## 2017-03-26 DIAGNOSIS — N179 Acute kidney failure, unspecified: Secondary | ICD-10-CM | POA: Diagnosis not present

## 2017-03-26 DIAGNOSIS — C50412 Malignant neoplasm of upper-outer quadrant of left female breast: Secondary | ICD-10-CM | POA: Diagnosis present

## 2017-03-26 DIAGNOSIS — F329 Major depressive disorder, single episode, unspecified: Secondary | ICD-10-CM | POA: Diagnosis present

## 2017-03-26 DIAGNOSIS — Z170 Estrogen receptor positive status [ER+]: Secondary | ICD-10-CM

## 2017-03-26 DIAGNOSIS — R197 Diarrhea, unspecified: Secondary | ICD-10-CM | POA: Diagnosis present

## 2017-03-26 DIAGNOSIS — K529 Noninfective gastroenteritis and colitis, unspecified: Secondary | ICD-10-CM | POA: Diagnosis present

## 2017-03-26 DIAGNOSIS — E440 Moderate protein-calorie malnutrition: Secondary | ICD-10-CM | POA: Diagnosis not present

## 2017-03-26 DIAGNOSIS — M7989 Other specified soft tissue disorders: Secondary | ICD-10-CM | POA: Diagnosis not present

## 2017-03-26 DIAGNOSIS — E43 Unspecified severe protein-calorie malnutrition: Secondary | ICD-10-CM | POA: Diagnosis present

## 2017-03-26 DIAGNOSIS — Z6821 Body mass index (BMI) 21.0-21.9, adult: Secondary | ICD-10-CM

## 2017-03-26 DIAGNOSIS — C50911 Malignant neoplasm of unspecified site of right female breast: Secondary | ICD-10-CM | POA: Diagnosis not present

## 2017-03-26 DIAGNOSIS — F32A Depression, unspecified: Secondary | ICD-10-CM | POA: Diagnosis present

## 2017-03-26 DIAGNOSIS — C7800 Secondary malignant neoplasm of unspecified lung: Secondary | ICD-10-CM | POA: Diagnosis present

## 2017-03-26 DIAGNOSIS — D500 Iron deficiency anemia secondary to blood loss (chronic): Secondary | ICD-10-CM | POA: Diagnosis present

## 2017-03-26 DIAGNOSIS — D72829 Elevated white blood cell count, unspecified: Secondary | ICD-10-CM | POA: Diagnosis not present

## 2017-03-26 DIAGNOSIS — D649 Anemia, unspecified: Secondary | ICD-10-CM | POA: Diagnosis present

## 2017-03-26 DIAGNOSIS — E860 Dehydration: Secondary | ICD-10-CM | POA: Diagnosis present

## 2017-03-26 DIAGNOSIS — K219 Gastro-esophageal reflux disease without esophagitis: Secondary | ICD-10-CM | POA: Diagnosis present

## 2017-03-26 DIAGNOSIS — Z886 Allergy status to analgesic agent status: Secondary | ICD-10-CM

## 2017-03-26 DIAGNOSIS — D630 Anemia in neoplastic disease: Secondary | ICD-10-CM | POA: Diagnosis present

## 2017-03-26 DIAGNOSIS — N189 Chronic kidney disease, unspecified: Secondary | ICD-10-CM | POA: Diagnosis present

## 2017-03-26 LAB — COMPREHENSIVE METABOLIC PANEL WITH GFR
ALT: 23 U/L (ref 14–54)
AST: 29 U/L (ref 15–41)
Albumin: 2.2 g/dL — ABNORMAL LOW (ref 3.5–5.0)
Alkaline Phosphatase: 125 U/L (ref 38–126)
Anion gap: 6 (ref 5–15)
BUN: 17 mg/dL (ref 6–20)
CO2: 25 mmol/L (ref 22–32)
Calcium: 8.9 mg/dL (ref 8.9–10.3)
Chloride: 107 mmol/L (ref 101–111)
Creatinine, Ser: 1.88 mg/dL — ABNORMAL HIGH (ref 0.44–1.00)
GFR calc Af Amer: 32 mL/min — ABNORMAL LOW
GFR calc non Af Amer: 28 mL/min — ABNORMAL LOW
Glucose, Bld: 96 mg/dL (ref 65–99)
Potassium: 4.8 mmol/L (ref 3.5–5.1)
Sodium: 138 mmol/L (ref 135–145)
Total Bilirubin: 0.4 mg/dL (ref 0.3–1.2)
Total Protein: 6.3 g/dL — ABNORMAL LOW (ref 6.5–8.1)

## 2017-03-26 LAB — CBC WITH DIFFERENTIAL/PLATELET
BASOS PCT: 0 %
Basophils Absolute: 0 10*3/uL (ref 0.0–0.1)
EOS ABS: 0.1 10*3/uL (ref 0.0–0.7)
EOS PCT: 1 %
HCT: 29.5 % — ABNORMAL LOW (ref 36.0–46.0)
HEMOGLOBIN: 8.9 g/dL — AB (ref 12.0–15.0)
LYMPHS ABS: 0.9 10*3/uL (ref 0.7–4.0)
Lymphocytes Relative: 4 %
MCH: 24.8 pg — AB (ref 26.0–34.0)
MCHC: 30.2 g/dL (ref 30.0–36.0)
MCV: 82.2 fL (ref 78.0–100.0)
Monocytes Absolute: 0.7 10*3/uL (ref 0.1–1.0)
Monocytes Relative: 4 %
NEUTROS PCT: 91 %
Neutro Abs: 19.1 10*3/uL — ABNORMAL HIGH (ref 1.7–7.7)
PLATELETS: 558 10*3/uL — AB (ref 150–400)
RBC: 3.59 MIL/uL — AB (ref 3.87–5.11)
RDW: 17.4 % — ABNORMAL HIGH (ref 11.5–15.5)
WBC: 20.9 10*3/uL — AB (ref 4.0–10.5)

## 2017-03-26 LAB — BRAIN NATRIURETIC PEPTIDE: B NATRIURETIC PEPTIDE 5: 199.3 pg/mL — AB (ref 0.0–100.0)

## 2017-03-26 LAB — I-STAT CG4 LACTIC ACID, ED: LACTIC ACID, VENOUS: 1.33 mmol/L (ref 0.5–1.9)

## 2017-03-26 MED ORDER — SODIUM CHLORIDE 0.9 % IV SOLN
INTRAVENOUS | Status: DC
  Administered 2017-03-26: 20:00:00 via INTRAVENOUS
  Administered 2017-03-27: 1 mL via INTRAVENOUS

## 2017-03-26 MED ORDER — OXYCODONE-ACETAMINOPHEN 5-325 MG PO TABS
1.0000 | ORAL_TABLET | Freq: Once | ORAL | Status: AC
  Administered 2017-03-26: 1 via ORAL
  Filled 2017-03-26: qty 1

## 2017-03-26 MED ORDER — GABAPENTIN 300 MG PO CAPS
300.0000 mg | ORAL_CAPSULE | Freq: Three times a day (TID) | ORAL | Status: DC
  Administered 2017-03-26 – 2017-03-30 (×11): 300 mg via ORAL
  Filled 2017-03-26 (×11): qty 1

## 2017-03-26 MED ORDER — ACETAMINOPHEN 325 MG PO TABS
650.0000 mg | ORAL_TABLET | Freq: Four times a day (QID) | ORAL | Status: DC | PRN
  Administered 2017-03-27: 650 mg via ORAL
  Filled 2017-03-26: qty 2

## 2017-03-26 MED ORDER — ENSURE ENLIVE PO LIQD
237.0000 mL | Freq: Three times a day (TID) | ORAL | Status: DC
  Administered 2017-03-26 – 2017-03-30 (×12): 237 mL via ORAL

## 2017-03-26 MED ORDER — ZOLPIDEM TARTRATE 5 MG PO TABS
5.0000 mg | ORAL_TABLET | Freq: Every evening | ORAL | Status: DC | PRN
  Filled 2017-03-26: qty 1

## 2017-03-26 MED ORDER — OXYCODONE-ACETAMINOPHEN 5-325 MG PO TABS
2.0000 | ORAL_TABLET | ORAL | Status: DC | PRN
  Administered 2017-03-26 – 2017-03-30 (×14): 2 via ORAL
  Filled 2017-03-26 (×15): qty 2

## 2017-03-26 MED ORDER — ONDANSETRON HCL 4 MG/2ML IJ SOLN
4.0000 mg | Freq: Four times a day (QID) | INTRAMUSCULAR | Status: DC | PRN
  Administered 2017-03-28: 4 mg via INTRAVENOUS
  Filled 2017-03-26: qty 2

## 2017-03-26 MED ORDER — ACETAMINOPHEN 650 MG RE SUPP: 650.0000 mg | Freq: Four times a day (QID) | RECTAL | Status: DC | PRN

## 2017-03-26 MED ORDER — ENOXAPARIN SODIUM 40 MG/0.4ML ~~LOC~~ SOLN: 40.0000 mg | SUBCUTANEOUS | Status: DC

## 2017-03-26 MED ORDER — MIRTAZAPINE 15 MG PO TBDP
15.0000 mg | ORAL_TABLET | Freq: Every day | ORAL | Status: DC
  Administered 2017-03-26 – 2017-03-29 (×4): 15 mg via ORAL
  Filled 2017-03-26 (×5): qty 1

## 2017-03-26 MED ORDER — LOPERAMIDE HCL 2 MG PO CAPS
2.0000 mg | ORAL_CAPSULE | ORAL | Status: DC | PRN
  Administered 2017-03-27: 2 mg via ORAL
  Filled 2017-03-26: qty 1

## 2017-03-26 MED ORDER — ONDANSETRON HCL 4 MG PO TABS
4.0000 mg | ORAL_TABLET | Freq: Four times a day (QID) | ORAL | Status: DC | PRN
  Administered 2017-03-27: 4 mg via ORAL
  Filled 2017-03-26: qty 1

## 2017-03-26 NOTE — ED Notes (Signed)
Pts wound is covered in bandages

## 2017-03-26 NOTE — Telephone Encounter (Signed)
error 

## 2017-03-26 NOTE — H&P (Signed)
History and Physical    Ann Oliver RUE:[id number] DOB: 1955/10/16 DOA: 03/26/2017  Referring MD/NP/PA:   PCP: [facility]   Patient coming from:  The patient is coming from home.  At baseline, pt is independent for most of ADL  Chief Complaint: diarrhea, worsening leg edema  HPI: Ann Oliver is a 61 y.o. female with medical history significant of metastasized fungating breast mass currently on palliative chemotherapy, anemia, malnutrition, depression, leg edema, who presents with diarrhea, worsening leg edema.  Patient was recently hospitalized from 10/25-10/30 due to acute bleeding from the fungating breast tumor, leukocytosis and breat cancer related pain. Pt was transfused with 2 unit of blood. ID was consulted for leukocytosis, and ID did not think wound is infected, so abx stopped 10/29. Pt states that she has been having foot and leg edema. During last hospitalization, she was found to have acute kidney injury and was taken off Lasix. Her leg edema has been worsening. Patient states home health has not come back to her home yet since she has been home for the last 2 days, and will not be able to come out until at least Monday. She states that she has been having diarrhea for about 1 week. She has had at least 5 diarrhea with loose stool today. Denies nausea, vomiting or abdominal pain. No symptoms of UTI or unilateral weakness.   ED Course: pt was found to have AKI with Cre 1.88, leukocytosis with WBC 20.9, lactic acid 1.33, temperature normal, heart rate in 90s, no tachypnea, oxygen saturation 100% on room air. Hemoglobin stable. Wound bandage in left breast was changed by EDP. Patient is admitted to [facility] bed for observation.   Review of Systems:   General: no fevers, chills, no body weight gain, has fatigue HEENT: no blurry vision, hearing changes or sore throat Respiratory: no dyspnea, coughing, wheezing CV: no chest pain, no palpitations GI: no  nausea, vomiting, abdominal pain, has diarrhea, no constipation GU: no dysuria, burning on urination, increased urinary frequency, hematuria  Ext: has leg edema Neuro: no unilateral weakness, numbness, or tingling, no vision change or hearing loss Skin: left breast fungating breast mass and wound, no bleeding, bandage was changed by EDP. MSK: No muscle spasm, no deformity, no limitation of range of movement in spin Heme: No easy bruising.  Travel history: No recent long distant travel.  Allergy:  Allergies  Allergen Reactions  . Aspirin Palpitations    Past Medical History:  Diagnosis Date  . Cancer ([place])   . Sinusitis   . Tumor cells     Past Surgical History:  Procedure Laterality Date  . BIOPSY BREAST    . IR FLUORO GUIDE CV LINE RIGHT  11/06/2016  . IR US GUIDE VASC ACCESS RIGHT  11/06/2016    Social History:  reports that she has never smoked. She has never used smokeless tobacco. She reports that she does not drink alcohol or use drugs.  Family History:  Family History  Problem Relation Age of Onset  . Breast cancer Neg Hx      Prior to Admission medications   Medication Sig Start Date End Date Taking? Authorizing Provider  feeding supplement, ENSURE ENLIVE, (ENSURE ENLIVE) LIQD Take 237 mLs by mouth 3 (three) times daily between meals. 09/26/16  Yes [name], MD  gabapentin (NEURONTIN) 300 MG capsule Take 1 capsule (300 mg total) by mouth 3 (three) times daily. 03/24/17  Yes Bonnielee Haff, MD  mirtazapine (REMERON SOL-TAB) 15 MG disintegrating  tablet Take 1 tablet (15 mg total) by mouth at bedtime. 03/24/17  Yes Bonnielee Haff, MD  oxyCODONE-acetaminophen (PERCOCET/ROXICET) 5-325 MG tablet Take 2 tablets by mouth every 4 (four) hours as needed for severe pain. 03/24/17  Yes Bonnielee Haff, MD  hydrocortisone (ANUSOL-HC) 25 MG suppository Place 1 suppository (25 mg total) rectally 2 (two) times daily as needed for hemorrhoids or anal itching. Patient not  taking: Reported on 03/15/2017 03/05/17   Gardenia Phlegm, NP  loperamide (IMODIUM) 2 MG capsule Take 1 capsule (2 mg total) by mouth as needed for diarrhea or loose stools. Take as directed on package Patient not taking: Reported on 03/15/2017 03/09/17   Nicholas Lose, MD  ondansetron (ZOFRAN) 8 MG tablet Take 1 tablet (8 mg total) by mouth every 8 (eight) hours as needed for nausea or vomiting. Patient not taking: Reported on 03/15/2017 03/05/17   Gardenia Phlegm, NP  pramoxine (PROCTOFOAM) 1 % foam Place 1 application rectally 3 (three) times daily as needed for anal itching. Patient not taking: Reported on 03/09/2017 03/05/17   Gardenia Phlegm, NP    Physical Exam: Vitals:   03/26/17 1930 03/26/17 1933 03/26/17 1945 03/26/17 1951  BP: 128/89  116/66 114/66  Pulse:  70  73  Resp:    16  Temp:      TempSrc:      SpO2:  100%  100%  Weight:      Height:       General: Not in acute distress. Dry mucus and membrane. HEENT:       Eyes: PERRL, EOMI, no scleral icterus.       ENT: No discharge from the ears and nose, no pharynx injection, no tonsillar enlargement.        Neck: No JVD, no bruit, no mass felt. Heme: No neck lymph node enlargement. Cardiac: S1/S2, RRR, No murmurs, No gallops or rubs. Respiratory: No rales, wheezing, rhonchi or rubs. GI: Soft, nondistended, nontender, no rebound pain, no organomegaly, BS present. GU: No hematuria Ext: 1+ pitting leg edema bilaterally. 2+DP/PT pulse bilaterally. Musculoskeletal: No joint deformities, No joint redness or warmth, no limitation of ROM in spin. Skin: left breast fungating breast mass and wound, no bleeding, bandage was changed by EDP. Neuro: Alert, oriented X3, cranial nerves II-XII grossly intact, moves all extremities normally.  Psych: Patient is not psychotic, no suicidal or hemocidal ideation.  Labs on Admission: I have personally reviewed following labs and imaging studies  CBC:  Recent  Labs Lab 03/21/17 0553 03/22/17 0446 03/23/17 0358 03/24/17 0422 03/26/17 1342  WBC 17.0* 22.0* 18.7* 23.8* 20.9*  NEUTROABS  --   --   --   --  19.1*  HGB 9.0* 8.7* 8.1* 8.7* 8.9*  HCT 28.4* 28.1* 26.4* 29.1* 29.5*  MCV 81.1 82.4 83.3 83.9 82.2  PLT 465* 526* 465* 573* 884*   Basic Metabolic Panel:  Recent Labs Lab 03/20/17 0340 03/21/17 0553 03/22/17 0446 03/23/17 0358 03/24/17 0422 03/26/17 1342  NA 140 138  --  143 141 138  K 4.2 4.4  --  4.1 3.6 4.8  CL 104 102  --  107 106 107  CO2 28 28  --  27 23 25   GLUCOSE 97 99  --  96 163* 96  BUN 9 9  --  13 11 17   CREATININE 0.65 0.78 1.30* 1.60* 1.39* 1.88*  CALCIUM 8.6* 8.5*  --  8.1* 8.4* 8.9   GFR: Estimated Creatinine Clearance: 23.4 mL/min (A) (by C-G  formula based on SCr of 1.88 mg/dL (H)). Liver Function Tests:  Recent Labs Lab 03/26/17 1342  AST 29  ALT 23  ALKPHOS 125  BILITOT 0.4  PROT 6.3*  ALBUMIN 2.2*   No results for input(s): LIPASE, AMYLASE in the last 168 hours. No results for input(s): AMMONIA in the last 168 hours. Coagulation Profile: No results for input(s): INR, PROTIME in the last 168 hours. Cardiac Enzymes: No results for input(s): CKTOTAL, CKMB, CKMBINDEX, TROPONINI in the last 168 hours. BNP (last 3 results) No results for input(s): PROBNP in the last 8760 hours. HbA1C: No results for input(s): HGBA1C in the last 72 hours. CBG: No results for input(s): GLUCAP in the last 168 hours. Lipid Profile: No results for input(s): CHOL, HDL, LDLCALC, TRIG, CHOLHDL, LDLDIRECT in the last 72 hours. Thyroid Function Tests: No results for input(s): TSH, T4TOTAL, FREET4, T3FREE, THYROIDAB in the last 72 hours. Anemia Panel: No results for input(s): VITAMINB12, FOLATE, FERRITIN, TIBC, IRON, RETICCTPCT in the last 72 hours. Urine analysis:    Component Value Date/Time   COLORURINE YELLOW 09/25/2016 0230   APPEARANCEUR CLEAR 09/25/2016 0230   LABSPEC 1.019 09/25/2016 0230   PHURINE 5.0  09/25/2016 0230   GLUCOSEU NEGATIVE 09/25/2016 0230   HGBUR NEGATIVE 09/25/2016 0230   BILIRUBINUR NEGATIVE 09/25/2016 0230   KETONESUR 5 (A) 09/25/2016 0230   PROTEINUR NEGATIVE 09/25/2016 0230   NITRITE NEGATIVE 09/25/2016 0230   LEUKOCYTESUR NEGATIVE 09/25/2016 0230   Sepsis Labs: @LABRCNTIP (procalcitonin:4,lacticidven:4) ) Recent Results (from the past 240 hour(s))  Culture, blood (routine x 2)     Status: None   Collection Time: 03/18/17  7:01 AM  Result Value Ref Range Status   Specimen Description BLOOD LEFT FOREARM  Final   Special Requests   Final    BOTTLES DRAWN AEROBIC AND ANAEROBIC Blood Culture adequate volume   Culture   Final    NO GROWTH 5 DAYS Performed at [facility], [address] [place], [place] [address]    Report Status 03/23/2017 FINAL  Final  Culture, blood (routine x 2)     Status: None   Collection Time: 03/18/17  7:09 AM  Result Value Ref Range Status   Specimen Description BLOOD LEFT ANTECUBITAL  Final   Special Requests   Final    BOTTLES DRAWN AEROBIC AND ANAEROBIC Blood Culture adequate volume   Culture   Final    NO GROWTH 5 DAYS Performed at [facility], [place] [address] [place],  [address]    Report Status 03/23/2017 FINAL  Final     Radiological Exams on Admission: No results found.   EKG:  Not done in ED, will get one.   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 Her Lasix was stopped. Cre improved from 1.6 to 1.39. Today her renal fx has worsened. Cre is up from 1.39 to 1.88, BUN 17. Pt states that she has been having diarrhea, likely due to prerenal failure secondary to  dehydration, but the ratio creatinine/BUN is not completely consistent with prerenal.   -will place on med-surg bed for obs - IVF: 75 cc/h  of NS - check UA to see if has significant proteinuria - Check  FeUrea - Follow up renal function by BMP - avoid NSAIDs - consult to CM for HH  Diarrhea: Etiology is not clear, may be related to chemotherapy. Patient does not have nausea, vomiting or abdominal pain, less likely to have bacterial infection. -Supportive care -IV fluid as above -Imodium  Leukocytosis: No fever or chills. Leukocytosis 20.9, which was 23.8 at discharge. In previous admission, ID was consulted, did not think patient has infection. Does not need antibiotics. -Follow up CBC  Depression: Stable, no suicidal or homicidal ideations. -Continue home medications: Remeron  Breast cancer of upper-outer quadrant of left female breast [facility]): pt has a metastasized (to lung) fungating breast mass, currently on palliative chemotherapy. Per previous discharge summary, "Per surgery, tumor could possibly be resected but it would be difficult to close the wound entirely. If patient considers surgery then she will have to be referred to surgery once she completes her current course of chemo and re- imaging". -f/u with oncologist, Dr. Sonny Dandy  Malnutrition of moderate degree: -Continue nutrition supplement  Leg swelling: Etiology is not clear patient does not have for respiratory distress, no history of CHF. May be due to worsening renal function and hypoalbuminemia. Albumin is 2.2. -will f/u UA to r/o proteinuria -elevation of legs -TED hose -hold off diuretic due to AKi  Anemia: hgb stable. 8.7 on 03/24/17-->8.9 today. -f/u by CBC   DVT ppx: SCD Code Status: Full code Family Communication: None at bed side.    Disposition Plan:  Anticipate discharge back to previous home environment Consults called:  none Admission status:  medical floor/obs       Date of Service 03/26/2017     Ivor Costa [facility] Pager [phone]  If 7PM-7AM, please contact night-coverage [email or web address] Password [facility] 03/26/2017, 8:35 PM

## 2017-03-26 NOTE — Progress Notes (Signed)
Per [facility] home health, unable to accommodate pt care at this time. [facility] to send in referral. Will fax referral to [phone].

## 2017-03-26 NOTE — ED Notes (Signed)
Wound has been rewrapped

## 2017-03-26 NOTE — ED Triage Notes (Signed)
Pt from home, pt has a chronic wound to her left breast and is here because home health care could not come check it yesterday or today. Pt has no complaints pain around wound, just wants it looked at. Wound is covered in bandage. Pt also endorses "My feet swelling is back" Pt was told to stop taking fluid pill yesterday. VSS. Breath sounds clear.

## 2017-03-26 NOTE — ED Notes (Signed)
Report attempted 

## 2017-03-26 NOTE — ED Provider Notes (Signed)
[facility] EMERGENCY DEPARTMENT Provider Note   CSN: [id number] Arrival date & time: 03/26/17  1254     History   Chief Complaint Chief Complaint  Patient presents with  . Wound Check    HPI Ann Oliver is a 61 y.o. female.  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  Patient states home health has not come back to her home yet since she has been home for the last 2 days and will not be able to come out until at least Monday and is requesting dressing changes of her breast mass.  Also states in the last 2 days she has had new swelling of her bilateral feet and lower legs.  She is denying any pain of the legs, fever, chest pain or shortness of breath.  During her last hospitalization it was unclear what was causing the acute kidney injury she does have chronic kidney disease which improved initially when taken off of Lasix.  She does not elevate her legs or use compression stockings at home.  He is continued to take her pain medication at home but did not follow-up with the cancer center today as it was outlined on her discharge paperwork.   The history is provided by the patient.  Wound Check     Past Medical History:  Diagnosis Date  . Cancer ([place])   . Sinusitis   . Tumor cells     Patient Active Problem List   Diagnosis Date Noted  . AKI (acute kidney injury) ([place]) 03/26/2017  . Acute bleeding 03/18/2017  . Breast wound 03/18/2017  . Blood loss anemia 01/18/2017  . Port catheter in place 12/02/2016  . Necrotizing soft tissue infection 11/21/2016  . Cellulitis 10/28/2016  . Wound infection 10/28/2016  . Cellulitis of left breast 10/28/2016  . Encounter for palliative care   . Goals of care,  counseling/discussion   . 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 Malnutrition of moderate degree 09/25/2016  . Breast cancer of upper-outer quadrant of left female breast ([place]) 10/22/2015  . THYROID NODULE, RIGHT 05/31/2007  . Depressed 05/31/2007  . GERD 05/31/2007  . HOT FLASHES 05/31/2007  . HEADACHE 05/31/2007    Past Surgical History:  Procedure Laterality Date  . BIOPSY BREAST    . IR FLUORO GUIDE CV LINE RIGHT  11/06/2016  . IR US GUIDE VASC ACCESS RIGHT  11/06/2016    OB History    No data available       Home Medications    Prior to Admission medications   Medication Sig Start Date End Date Taking? Authorizing Provider  feeding supplement, ENSURE ENLIVE, (ENSURE ENLIVE) LIQD Take 237 mLs by mouth 3 (three) times daily between meals. 09/26/16  Yes [name], MD  gabapentin (NEURONTIN) 300 MG capsule Take 1 capsule (300 mg total) by mouth 3 (three) times daily. 03/24/17  Yes Bonnielee Haff, MD  mirtazapine (REMERON SOL-TAB) 15 MG disintegrating tablet Take 1 tablet (15 mg total) by mouth at bedtime. 03/24/17  Yes Bonnielee Haff, MD  oxyCODONE-acetaminophen (PERCOCET/ROXICET) 5-325 MG tablet Take 2 tablets by mouth every 4 (four) hours as needed for severe pain. 03/24/17  Yes Bonnielee Haff, MD  hydrocortisone (ANUSOL-HC) 25 MG suppository Place 1 suppository (25 mg total) rectally  2 (two) times daily as needed for hemorrhoids or anal itching. Patient not taking: Reported on 03/15/2017 03/05/17   Gardenia Phlegm, NP  loperamide (IMODIUM) 2 MG capsule Take 1 capsule (2 mg total) by mouth as needed for diarrhea or loose stools. Take as directed on package Patient not taking: Reported on 03/15/2017 03/09/17   Nicholas Lose, MD  ondansetron (ZOFRAN) 8 MG tablet Take 1 tablet (8 mg total) by mouth every 8 (eight) hours as needed for nausea or vomiting. Patient not taking: Reported on 03/15/2017 03/05/17   Gardenia Phlegm, NP    pramoxine (PROCTOFOAM) 1 % foam Place 1 application rectally 3 (three) times daily as needed for anal itching. Patient not taking: Reported on 03/09/2017 03/05/17   Gardenia Phlegm, NP    Family History Family History  Problem Relation Age of Onset  . Breast cancer Neg Hx     Social History Social History  Substance Use Topics  . Smoking status: Never Smoker  . Smokeless tobacco: Never Used  . Alcohol use No     Allergies   Aspirin   Review of Systems Review of Systems  All other systems reviewed and are negative.    Physical Exam Updated Vital Signs BP 123/77   Pulse 91   Temp 98.2 F (36.8 C) (Oral)   Resp 18   Ht 5\' 1"  (1.549 m)   Wt 52.6 kg (116 lb)   SpO2 100%   BMI 21.92 kg/m   Physical Exam  Constitutional: She is oriented to person, place, and time. She appears well-developed and well-nourished. No distress.  HENT:  Head: Normocephalic and atraumatic.  Mouth/Throat: Oropharynx is clear and moist.  Eyes: Pupils are equal, round, and reactive to light. Conjunctivae and EOM are normal.  Neck: Normal range of motion. Neck supple.  Cardiovascular: Normal rate, regular rhythm and intact distal pulses.   No murmur heard. Pulmonary/Chest: Effort normal and breath sounds normal. No respiratory distress. She has no wheezes. She has no rales.    Abdominal: Soft. She exhibits no distension. There is no tenderness. There is no rebound and no guarding.  Musculoskeletal: Normal range of motion. She exhibits edema. She exhibits no tenderness.  2+ pitting edema from below the knee down bilaterally.  Nontender  Neurological: She is alert and oriented to person, place, and time.  Skin: Skin is warm and dry. Capillary refill takes 2 to 3 seconds. No rash noted. No erythema.  Psychiatric: She has a normal mood and affect. Her behavior is normal.  Nursing note and vitals reviewed.    ED Treatments / Results  Labs (all labs ordered are listed, but only  abnormal results are displayed) Labs Reviewed  COMPREHENSIVE METABOLIC PANEL - Abnormal; Notable for the following:       Result Value   Creatinine, Ser 1.88 (*)    Total Protein 6.3 (*)    Albumin 2.2 (*)    GFR calc non Af Amer 28 (*)    GFR calc Af Amer 32 (*)    All other components within normal limits  CBC WITH DIFFERENTIAL/PLATELET - Abnormal; Notable for the following:    WBC 20.9 (*)    RBC 3.59 (*)    Hemoglobin 8.9 (*)    HCT 29.5 (*)    MCH 24.8 (*)    RDW 17.4 (*)    Platelets 558 (*)    Neutro Abs 19.1 (*)    All other components within normal limits  I-STAT CG4 LACTIC  ACID, ED    EKG  EKG Interpretation None       Radiology No results found.  Procedures Procedures (including critical care time)  Medications Ordered in ED Medications  oxyCODONE-acetaminophen (PERCOCET/ROXICET) 5-325 MG per tablet 1 tablet (1 tablet Oral Given 03/26/17 1832)     Initial Impression / Assessment and Plan / ED Course  I have reviewed the triage vital signs and the nursing notes.  Pertinent labs & imaging results that were available during my care of the patient were reviewed by me and considered in my medical decision making (see chart for details).     Patient returning with multiple medical problems but now new lower extremity swelling.  Patient was on Lasix but unclear what was causing the lower extremity swelling.  However Lasix was stopped during her latest hospitalization which she was discharged 2 days ago because of her acute kidney injury.  Patient does have some chronic kidney disease but her creatinine had worsened from baseline of 1.3-1.6.  However prior to discharge it had improved.  Patient has not taken Lasix at home but noticed in the new swelling.  She denies chest pain or shortness of breath.  She is also requesting dressing change.  Patient's dressing was changed today and does not appear to have current complications of her already known fungating breast  mass patient had an unknown leukocytosis while hospitalized which persist.  However today she has worsening renal function with a creatinine of 1.88.  Unclear what is causing this acute kidney injury as she is not on specific medications that should affect the kidneys.  Electrolytes are within normal limits.  Patient does not appear excessively dehydrated she states she is eating and drinking at home.  Patient was given compression hose however discussed with the hospitalist who recommended hospitalization for observation to ensure no worsening of her kidney function.  She denies any urinary symptoms at this time.  Final Clinical Impressions(s) / ED Diagnoses   Final diagnoses:  Acute kidney injury [facility])    New Prescriptions New Prescriptions   No medications on file     Blanchie Dessert, MD 03/26/17 1949

## 2017-03-27 DIAGNOSIS — C50911 Malignant neoplasm of unspecified site of right female breast: Secondary | ICD-10-CM | POA: Diagnosis not present

## 2017-03-27 DIAGNOSIS — E43 Unspecified severe protein-calorie malnutrition: Secondary | ICD-10-CM | POA: Diagnosis present

## 2017-03-27 DIAGNOSIS — N179 Acute kidney failure, unspecified: Principal | ICD-10-CM

## 2017-03-27 DIAGNOSIS — F329 Major depressive disorder, single episode, unspecified: Secondary | ICD-10-CM | POA: Diagnosis present

## 2017-03-27 DIAGNOSIS — Z886 Allergy status to analgesic agent status: Secondary | ICD-10-CM | POA: Diagnosis not present

## 2017-03-27 DIAGNOSIS — T451X5A Adverse effect of antineoplastic and immunosuppressive drugs, initial encounter: Secondary | ICD-10-CM | POA: Diagnosis present

## 2017-03-27 DIAGNOSIS — Z6821 Body mass index (BMI) 21.0-21.9, adult: Secondary | ICD-10-CM | POA: Diagnosis not present

## 2017-03-27 DIAGNOSIS — R197 Diarrhea, unspecified: Secondary | ICD-10-CM

## 2017-03-27 DIAGNOSIS — E860 Dehydration: Secondary | ICD-10-CM | POA: Diagnosis present

## 2017-03-27 DIAGNOSIS — C7800 Secondary malignant neoplasm of unspecified lung: Secondary | ICD-10-CM

## 2017-03-27 DIAGNOSIS — K219 Gastro-esophageal reflux disease without esophagitis: Secondary | ICD-10-CM | POA: Diagnosis present

## 2017-03-27 DIAGNOSIS — M7989 Other specified soft tissue disorders: Secondary | ICD-10-CM | POA: Diagnosis not present

## 2017-03-27 DIAGNOSIS — D630 Anemia in neoplastic disease: Secondary | ICD-10-CM | POA: Diagnosis present

## 2017-03-27 DIAGNOSIS — N189 Chronic kidney disease, unspecified: Secondary | ICD-10-CM | POA: Diagnosis present

## 2017-03-27 DIAGNOSIS — K529 Noninfective gastroenteritis and colitis, unspecified: Secondary | ICD-10-CM | POA: Diagnosis present

## 2017-03-27 DIAGNOSIS — D72829 Elevated white blood cell count, unspecified: Secondary | ICD-10-CM | POA: Diagnosis not present

## 2017-03-27 DIAGNOSIS — C50412 Malignant neoplasm of upper-outer quadrant of left female breast: Secondary | ICD-10-CM | POA: Diagnosis present

## 2017-03-27 DIAGNOSIS — E440 Moderate protein-calorie malnutrition: Secondary | ICD-10-CM | POA: Diagnosis not present

## 2017-03-27 DIAGNOSIS — D500 Iron deficiency anemia secondary to blood loss (chronic): Secondary | ICD-10-CM | POA: Diagnosis present

## 2017-03-27 LAB — BASIC METABOLIC PANEL
ANION GAP: 8 (ref 5–15)
BUN: 13 mg/dL (ref 6–20)
CALCIUM: 8.2 mg/dL — AB (ref 8.9–10.3)
CO2: 24 mmol/L (ref 22–32)
Chloride: 107 mmol/L (ref 101–111)
Creatinine, Ser: 1.56 mg/dL — ABNORMAL HIGH (ref 0.44–1.00)
GFR calc Af Amer: 40 mL/min — ABNORMAL LOW (ref >60)
GFR calc non Af Amer: 35 mL/min — ABNORMAL LOW (ref >60)
GLUCOSE: 75 mg/dL (ref 65–99)
Potassium: 4.2 mmol/L (ref 3.5–5.1)
Sodium: 139 mmol/L (ref 135–145)

## 2017-03-27 LAB — URINALYSIS, ROUTINE W REFLEX MICROSCOPIC
BACTERIA UA: NONE SEEN
Bilirubin Urine: NEGATIVE
Glucose, UA: NEGATIVE mg/dL
HGB URINE DIPSTICK: NEGATIVE
KETONES UR: 5 mg/dL — AB
NITRITE: NEGATIVE
PROTEIN: NEGATIVE mg/dL
RBC / HPF: NONE SEEN RBC/hpf (ref 0–5)
Specific Gravity, Urine: 1.012 (ref 1.005–1.030)
pH: 5 (ref 5.0–8.0)

## 2017-03-27 LAB — CBC
HCT: 26.1 % — ABNORMAL LOW (ref 36.0–46.0)
HEMOGLOBIN: 7.9 g/dL — AB (ref 12.0–15.0)
MCH: 24.7 pg — AB (ref 26.0–34.0)
MCHC: 30.3 g/dL (ref 30.0–36.0)
MCV: 81.6 fL (ref 78.0–100.0)
Platelets: 513 10*3/uL — ABNORMAL HIGH (ref 150–400)
RBC: 3.2 MIL/uL — ABNORMAL LOW (ref 3.87–5.11)
RDW: 17.8 % — ABNORMAL HIGH (ref 11.5–15.5)
WBC: 20 10*3/uL — ABNORMAL HIGH (ref 4.0–10.5)

## 2017-03-27 MED ORDER — SODIUM CHLORIDE 0.9% FLUSH
10.0000 mL | INTRAVENOUS | Status: DC | PRN
  Administered 2017-03-27 – 2017-03-30 (×2): 10 mL
  Filled 2017-03-27 (×2): qty 40

## 2017-03-27 MED ORDER — LOPERAMIDE HCL 2 MG PO CAPS
2.0000 mg | ORAL_CAPSULE | Freq: Two times a day (BID) | ORAL | Status: DC
  Administered 2017-03-27 – 2017-03-28 (×2): 2 mg via ORAL
  Filled 2017-03-27 (×2): qty 1

## 2017-03-27 NOTE — Progress Notes (Signed)
PROGRESS NOTE    Ann Oliver  FYB:[id number] DOB: Sep 17, 1955 DOA: 03/26/2017 PCP: [facility]  Brief Narrative:Ann Oliver is a 61 y.o. 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 Patient was recently hospitalized from 10/25-10/30 due to acute bleeding from the fungating breast tumor, leukocytosis and breat cancer related pain. Pt was transfused with 2 unit of blood. ID was consulted for leukocytosis, and ID did not think wound is infected, so abx stopped 10/29. Pt states that she has been having foot and leg edema. During last hospitalization, she was found to have acute kidney injury and was taken off Lasix   Assessment & Plan:   AKI (acute kidney injury) ([place]):  -Mild secondary to chronic diarrhea and dehydration, diuretics  -Hydrated with normal saline, creatinine improving - will stop fluids today due to edema  Chronic diarrhea -Long-standing issue -Secondary to her chemotherapy/Taxol (40% pts have diarrhea from this) -Continue Imodium will schedule it to twice a day  Chronic leukocytosis -Baseline white count is in the 20-22,000 range, this is likely secondary to her chronically fungating breast mass and cancer -No other signs of active infection,  in fact was just seen by infectious disease 2 days ago and antibiotics stopped then  Large fungating breast mass/metastatic breast cancer -Followed by Dr. Sonny Dandy, on palliative chemotherapy with Taxol -Followed at the wound center monthly, and apparently he used to get home health RN services for wound care -We'll consult case management to ensure his option of home health services -[facility] RN consult for inpatient recommendations  Edema: -I suspect this is secondary to third spacing and hypoalbuminemia, malnutrition and advanced  cancer -Encouraged to increase protein intake, stop IV fluids today  Severe protein calorie malnutrition -Supplements as tolerated  Chronic blood loss anemia -Due to large fungating breast mass with bleeding, required 2 units of PRBC last week at [facility] long -Monitor CBC closely -Also has anemia of chronic disease due to malignancy and chemotherapy  Depression: Stable, continue Remeron  DVT ppx: SCD Code Status: Full code Family Communication: None at bed side.    Disposition Plan: Home in few days    Procedures:   Antimicrobials:    Subjective: Feels okay, had some diarrhea earlier today  Objective: Vitals:   03/26/17 2100 03/26/17 2128 03/27/17 0436 03/27/17 0500  BP: 105/63 121/64 119/65   Pulse: 87 95 95   Resp:  18 18   Temp:  98.4 F (36.9 C) 99.4 F (37.4 C)   TempSrc:  Oral Oral   SpO2: 96% 100% 100%   Weight:  49.1 kg (108 lb 3.2 oz)  52.9 kg (116 lb 9.6 oz)  Height:  5\' 2"  (1.575 m)      Intake/Output Summary (Last 24 hours) at 03/27/17 1320 Last data filed at 03/27/17 0437  Gross per 24 hour  Intake            842.5 ml  Output                0 ml  Net            842.5 ml   Filed Weights   03/26/17 1337 03/26/17 2128 03/27/17 0500  Weight: 52.6 kg (116 lb) 49.1 kg (108 lb 3.2 oz) 52.9 kg (116 lb 9.6 oz)    Examination:  General exam: Chronically ill frail cachectic female Respiratory system: Clear to auscultation. Respiratory effort  normal. Chest:  large dressing overlying the left chest, breast and axillary region:  Cardiovascular system: S1 & S2 heard, RRR. No JVD, murmurs, rubs, gallops  Gastrointestinal system: Abdomen is nondistended, soft and nontender.Normal bowel sounds heard. Central nervous system: Alert and oriented. No focal neurological deficits. Extremities: Symmetric 5 x 5 power. Skin: No rashes, lesions or ulcers Psychiatry: Judgement and insight appear normal. Mood & affect appropriate.     Data Reviewed:    CBC:  Recent Labs Lab 03/22/17 0446 03/23/17 0358 03/24/17 0422 03/26/17 1342 03/27/17 0641  WBC 22.0* 18.7* 23.8* 20.9* 20.0*  NEUTROABS  --   --   --  19.1*  --   HGB 8.7* 8.1* 8.7* 8.9* 7.9*  HCT 28.1* 26.4* 29.1* 29.5* 26.1*  MCV 82.4 83.3 83.9 82.2 81.6  PLT 526* 465* 573* 558* 275*   Basic Metabolic Panel:  Recent Labs Lab 03/21/17 0553 03/22/17 0446 03/23/17 0358 03/24/17 0422 03/26/17 1342 03/27/17 0641  NA 138  --  143 141 138 139  K 4.4  --  4.1 3.6 4.8 4.2  CL 102  --  107 106 107 107  CO2 28  --  27 23 25 24   GLUCOSE 99  --  96 163* 96 75  BUN 9  --  13 11 17 13   CREATININE 0.78 1.30* 1.60* 1.39* 1.88* 1.56*  CALCIUM 8.5*  --  8.1* 8.4* 8.9 8.2*   GFR: Estimated Creatinine Clearance: 29.6 mL/min (A) (by C-G formula based on SCr of 1.56 mg/dL (H)). Liver Function Tests:  Recent Labs Lab 03/26/17 1342  AST 29  ALT 23  ALKPHOS 125  BILITOT 0.4  PROT 6.3*  ALBUMIN 2.2*   No results for input(s): LIPASE, AMYLASE in the last 168 hours. No results for input(s): AMMONIA in the last 168 hours. Coagulation Profile: No results for input(s): INR, PROTIME in the last 168 hours. Cardiac Enzymes: No results for input(s): CKTOTAL, CKMB, CKMBINDEX, TROPONINI in the last 168 hours. BNP (last 3 results) No results for input(s): PROBNP in the last 8760 hours. HbA1C: No results for input(s): HGBA1C in the last 72 hours. CBG: No results for input(s): GLUCAP in the last 168 hours. Lipid Profile: No results for input(s): CHOL, HDL, LDLCALC, TRIG, CHOLHDL, LDLDIRECT in the last 72 hours. Thyroid Function Tests: No results for input(s): TSH, T4TOTAL, FREET4, T3FREE, THYROIDAB in the last 72 hours. Anemia Panel: No results for input(s): VITAMINB12, FOLATE, FERRITIN, TIBC, IRON, RETICCTPCT in the last 72 hours. Urine analysis:    Component Value Date/Time   COLORURINE YELLOW 09/25/2016 0230   APPEARANCEUR CLEAR 09/25/2016 0230   LABSPEC 1.019 09/25/2016  0230   PHURINE 5.0 09/25/2016 0230   GLUCOSEU NEGATIVE 09/25/2016 0230   HGBUR NEGATIVE 09/25/2016 0230   BILIRUBINUR NEGATIVE 09/25/2016 0230   KETONESUR 5 (A) 09/25/2016 0230   PROTEINUR NEGATIVE 09/25/2016 0230   NITRITE NEGATIVE 09/25/2016 0230   LEUKOCYTESUR NEGATIVE 09/25/2016 0230   Sepsis Labs: @LABRCNTIP (procalcitonin:4,lacticidven:4)  ) Recent Results (from the past 240 hour(s))  Culture, blood (routine x 2)     Status: None   Collection Time: 03/18/17  7:01 AM  Result Value Ref Range Status   Specimen Description BLOOD LEFT FOREARM  Final   Special Requests   Final    BOTTLES DRAWN AEROBIC AND ANAEROBIC Blood Culture adequate volume   Culture   Final    NO GROWTH 5 DAYS Performed at [facility], [address], [place], [place] [address]  Report Status 03/23/2017 FINAL  Final  Culture, blood (routine x 2)     Status: None   Collection Time: 03/18/17  7:09 AM  Result Value Ref Range Status   Specimen Description BLOOD LEFT ANTECUBITAL  Final   Special Requests   Final    BOTTLES DRAWN AEROBIC AND ANAEROBIC Blood Culture adequate volume   Culture   Final    NO GROWTH 5 DAYS Performed at [facility], [address] [place], [place] [address]    Report Status 03/23/2017 FINAL  Final         Radiology Studies: No results found.      Scheduled Meds: . feeding supplement (ENSURE ENLIVE)  237 mL Oral TID BM  . gabapentin  300 mg Oral TID  . loperamide  2 mg Oral BID  . mirtazapine  15 mg Oral QHS   Continuous Infusions:   LOS: 0 days    Time spent: 54min    Domenic Polite, MD [facility] Page via [email or web address], password [email or web address] After 7PM please contact night-coverage  03/27/2017, 1:20 PM

## 2017-03-28 ENCOUNTER — Other Ambulatory Visit: Payer: Self-pay

## 2017-03-28 DIAGNOSIS — E440 Moderate protein-calorie malnutrition: Secondary | ICD-10-CM

## 2017-03-28 DIAGNOSIS — D72829 Elevated white blood cell count, unspecified: Secondary | ICD-10-CM

## 2017-03-28 LAB — BASIC METABOLIC PANEL
Anion gap: 8 (ref 5–15)
BUN: 12 mg/dL (ref 6–20)
CALCIUM: 8 mg/dL — AB (ref 8.9–10.3)
CHLORIDE: 104 mmol/L (ref 101–111)
CO2: 25 mmol/L (ref 22–32)
Creatinine, Ser: 1.58 mg/dL — ABNORMAL HIGH (ref 0.44–1.00)
GFR, EST AFRICAN AMERICAN: 39 mL/min — AB (ref >60)
GFR, EST NON AFRICAN AMERICAN: 34 mL/min — AB (ref >60)
Glucose, Bld: 80 mg/dL (ref 65–99)
Potassium: 4.2 mmol/L (ref 3.5–5.1)
SODIUM: 137 mmol/L (ref 135–145)

## 2017-03-28 LAB — TYPE AND SCREEN
ABO/RH(D): A POS
ANTIBODY SCREEN: NEGATIVE

## 2017-03-28 LAB — CBC
HCT: 26 % — ABNORMAL LOW (ref 36.0–46.0)
Hemoglobin: 7.8 g/dL — ABNORMAL LOW (ref 12.0–15.0)
MCH: 24.5 pg — ABNORMAL LOW (ref 26.0–34.0)
MCHC: 30 g/dL (ref 30.0–36.0)
MCV: 81.8 fL (ref 78.0–100.0)
PLATELETS: 527 10*3/uL — AB (ref 150–400)
RBC: 3.18 MIL/uL — AB (ref 3.87–5.11)
RDW: 18 % — AB (ref 11.5–15.5)
WBC: 15.3 10*3/uL — AB (ref 4.0–10.5)

## 2017-03-28 LAB — PREPARE RBC (CROSSMATCH)

## 2017-03-28 LAB — ABO/RH: ABO/RH(D): A POS

## 2017-03-28 MED ORDER — LOPERAMIDE HCL 2 MG PO CAPS
2.0000 mg | ORAL_CAPSULE | Freq: Two times a day (BID) | ORAL | Status: DC | PRN
  Administered 2017-03-28: 2 mg via ORAL
  Filled 2017-03-28: qty 1

## 2017-03-28 MED ORDER — SODIUM CHLORIDE 0.9 % IV SOLN: Freq: Once | INTRAVENOUS | Status: DC

## 2017-03-28 NOTE — Evaluation (Signed)
Occupational Therapy Evaluation Patient Details Name: Ann Oliver MRN: [id number] DOB: 31-Oct-1955 Today's Date: 03/28/2017    History of Present Illness 61 y.o. 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  Med treatment for AKI, chronic diarrhea, chronic leukocytosis, wound care for breast mass, severe protein malnutrition, chronic BLA, depression.   Clinical Impression   PTA, pt was living alone was independent with ADLs and light IADLs. Pt currently performing ADLs and functional mobility at supervision for safety. Provided education, answered pt's questions, and met all acute OT needs. Recommend dc home with initial 24 hour supervision. Will sign off.    Follow Up Recommendations  No OT follow up;Supervision/Assistance - 24 hour(HH RN)    Equipment Recommendations  None recommended by OT    Recommendations for Other Services       Precautions / Restrictions Precautions Precautions: Fall Precaution Comments: up with supervision Restrictions Other Position/Activity Restrictions: keep feet elevated when sitting/supine      Mobility Bed Mobility Overal bed mobility: Independent                Transfers Overall transfer level: Independent                    Balance Overall balance assessment: No apparent balance deficits (not formally assessed)                                         ADL either performed or assessed with clinical judgement   ADL Overall ADL's : Needs assistance/impaired                                       General ADL Comments: Pt performing ADLs and fucnitonal mobility at supervision level. Provided education on edema management.     Vision         Perception     Praxis      Pertinent Vitals/Pain Pain Assessment: No/denies  pain     Hand Dominance Right   Extremity/Trunk Assessment Upper Extremity Assessment Upper Extremity Assessment: Overall WFL for tasks assessed   Lower Extremity Assessment Lower Extremity Assessment: Overall WFL for tasks assessed   Cervical / Trunk Assessment Cervical / Trunk Assessment: Normal   Communication Communication Communication: No difficulties   Cognition Arousal/Alertness: Awake/alert Behavior During Therapy: WFL for tasks assessed/performed Overall Cognitive Status: Within Functional Limits for tasks assessed                                     General Comments  mod edema; TED hose around ankles on arrival    Exercises     Shoulder Instructions      Home Living Family/patient expects to be discharged to:: Private residence Living Arrangements: Alone Available Help at Discharge: Friend(s);Available PRN/intermittently Type of Home: Apartment Home Access: Stairs to enter Entrance Stairs-Number of Steps: 10 Entrance Stairs-Rails: Left Home Layout: One level     Bathroom Shower/Tub: Teacher, early years/pre: Standard     Home Equipment: None   Additional Comments: has transportation and stores near apt       Prior Functioning/Environment Level of Independence:  Independent        Comments: cooks most meals, I w/ADLs        OT Problem List: Decreased activity tolerance;Impaired balance (sitting and/or standing);Decreased safety awareness      OT Treatment/Interventions:      OT Goals(Current goals can be found in the care plan section) Acute Rehab OT Goals Patient Stated Goal: Go home OT Goal Formulation: With patient Time For Goal Achievement: 04/11/17 Potential to Achieve Goals: Good  OT Frequency:     Barriers to D/C:            Co-evaluation              AM-PAC PT "6 Clicks" Daily Activity     Outcome Measure Help from another person eating meals?: None Help from another person taking care of  personal grooming?: None Help from another person toileting, which includes using toliet, bedpan, or urinal?: A Little Help from another person bathing (including washing, rinsing, drying)?: A Little Help from another person to put on and taking off regular upper body clothing?: None Help from another person to put on and taking off regular lower body clothing?: A Little 6 Click Score: 21   End of Session Nurse Communication: Mobility status  Activity Tolerance: Patient tolerated treatment well Patient left: in chair;with call bell/phone within reach  OT Visit Diagnosis: Unsteadiness on feet (R26.81)                Time: 3557-3220 OT Time Calculation (min): 17 min Charges:  OT General Charges $OT Visit: 1 Visit OT Evaluation $OT Eval Low Complexity: 1 Low G-Codes:     [place], OTR/L Acute Rehab Pager: [phone] Office: [place] 03/28/2017, 1:02 PM

## 2017-03-28 NOTE — Care Management Note (Signed)
Case Management Note  Patient Details  Name: Ann Oliver MRN: [id number] Date of Birth: 08-28-1955  Subjective/Objective: Received call for [place], possible discharge 03/29/2017. No orders in chart. Unable to reach pt by phone. LM for MD to order follow up care as desired in Physician Sticky Note and explicit Wound Care directions in DCS. CM will need to follow.                    Action/Plan:CM will sign off for now but will be available should additional discharge needs arise or disposition change.    Expected Discharge Date:                  Expected Discharge Plan:     In-House Referral:     Discharge planning Services  CM Consult  Post Acute Care Choice:    Choice offered to:  Patient(Attempted to contact pt via phone in room w/o success; will leave handoff for CM)  DME Arranged:    DME Agency:     HH Arranged:    [place] Agency:     Status of Service:  In process, will continue to follow  If discussed at Long Length of Stay Meetings, dates discussed:    Additional Comments:  Delrae Sawyers, RN 03/28/2017, 4:01 PM

## 2017-03-28 NOTE — Plan of Care (Signed)
  Adequate for Discharge Education: Knowledge of [place] Education information/materials will improve 03/28/2017 1301 - Adequate for Discharge by Ainsley Spinner, RN Safety: Ability to remain free from injury will improve 03/28/2017 1301 - Adequate for Discharge by Ainsley Spinner, RN Pain Managment: General experience of comfort will improve 03/28/2017 1301 - Adequate for Discharge by Ainsley Spinner, RN Physical Regulation: Ability to maintain clinical measurements within normal limits will improve 03/28/2017 1301 - Adequate for Discharge by Ainsley Spinner, RN Nutrition: Adequate nutrition will be maintained 03/28/2017 1301 - Adequate for Discharge by Ainsley Spinner, RN

## 2017-03-28 NOTE — Progress Notes (Signed)
PROGRESS NOTE    Ann Oliver  QIO:[id number] DOB: 05/23/1956 DOA: 03/26/2017 PCP: [facility]  Brief Narrative:Ann Oliver is a 61 y.o. 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 Patient was recently hospitalized from 10/25-10/30 due to acute bleeding from the fungating breast tumor, leukocytosis and breat cancer related pain. Pt was transfused with 2 unit of blood. ID was consulted for leukocytosis, and ID did not think wound is infected, so abx stopped 10/29. Pt states that she has been having foot and leg edema. During last hospitalization, she was found to have acute kidney injury and was taken off Lasix   Assessment & Plan:   AKI (acute kidney injury) ([place]):  -Mild secondary to chronic diarrhea and dehydration, diuretics  -Hydrated with normal saline, creatinine improving nd stable now -Start IV fluids  Chronic diarrhea -Long-standing issue -Secondary to her chemotherapy/Taxol (40% pts have diarrhea from this) -Continue Imodium, changed to when necessary today  Chronic leukocytosis -Baseline white count is in the 17-22,000 range, this is likely secondary to her chronically fungating breast mass and cancer -No other signs of active infection,  in fact was just seen by infectious disease 2 days ago and antibiotics stopped then  Large fungating breast mass/metastatic breast cancer -Followed by Dr. Sonny Dandy, on palliative chemotherapy with Taxol -Followed at the wound center monthly, and apparently he used to get home health RN services for wound care -We'll consult case management to ensure option of home health RN services, also needs to be seen at the wound clinic more than once a month -[facility] RN consult for inpatient recommendations pending  Chronic blood loss anemia -Due to large  fungating breast mass with bleeding, required 2 units of PRBC last week at [facility] long -Monitor CBC closely -Also has anemia of chronic disease due to malignancy and chemotherapy -Hemoglobin down to 7.8 this morning, baseline is in the mid 8 range due to ongoing intermittent oozing from wound will preemptively transfuse 1 unit of PRBC today  Edema: -I suspect this is secondary to third spacing and hypoalbuminemia, malnutrition and advanced cancer -Encouraged to increase protein intake, stopped IV fluids today  Severe protein calorie malnutrition -Supplements as tolerated  Depression: Stable, continue Remeron  DVT ppx: SCD Code Status: Full code Family Communication: None at bed side.    Disposition Plan: Home tomorrow    Procedures:   Antimicrobials:    Subjective: -diarrhea is better, no dressing changed as far  Objective: Vitals:   03/27/17 1400 03/27/17 2041 03/28/17 0500 03/28/17 0525  BP: (!) 106/57 (!) 105/57  110/62  Pulse: 88 91  85  Resp: 19 18  18   Temp: 99 F (37.2 C) 98.5 F (36.9 C)  98.8 F (37.1 C)  TempSrc: Oral Oral  Oral  SpO2: 100% 99%  100%  Weight:   53.8 kg (118 lb 8 oz)   Height:        Intake/Output Summary (Last 24 hours) at 03/28/2017 1142 Last data filed at 03/27/2017 1425 Gross per 24 hour  Intake 240 ml  Output -  Net 240 ml   Filed Weights   03/26/17 2128 03/27/17 0500 03/28/17 0500  Weight: 49.1 kg (108 lb 3.2 oz) 52.9 kg (116 lb 9.6 oz) 53.8 kg (118 lb 8 oz)    Examination:  Gen: Awake, Alert, Oriented X 3, chronically ill cachectic African-American female, no distress HEENT: PERRLA, Neck  supple, no JVD Lungs: Good air movement bilaterally, CTAB Chest: large dressing overlying the left chest, breast and axillary region CVS: RRR,No Gallops,Rubs or new Murmurs Abd: soft, Non tender, non distended, BS present Extremities: No Cyanosis, Clubbing or edema Skin: no new rashes Psychiatry: Judgement and insight appear normal.  Mood & affect appropriate.     Data Reviewed:   CBC: Recent Labs  Lab 03/23/17 0358 03/24/17 0422 03/26/17 1342 03/27/17 0641 03/28/17 0401  WBC 18.7* 23.8* 20.9* 20.0* 15.3*  NEUTROABS  --   --  19.1*  --   --   HGB 8.1* 8.7* 8.9* 7.9* 7.8*  HCT 26.4* 29.1* 29.5* 26.1* 26.0*  MCV 83.3 83.9 82.2 81.6 81.8  PLT 465* 573* 558* 513* 194*   Basic Metabolic Panel: Recent Labs  Lab 03/23/17 0358 03/24/17 0422 03/26/17 1342 03/27/17 0641 03/28/17 0401  NA 143 141 138 139 137  K 4.1 3.6 4.8 4.2 4.2  CL 107 106 107 107 104  CO2 27 23 25 24 25   GLUCOSE 96 163* 96 75 80  BUN 13 11 17 13 12   CREATININE 1.60* 1.39* 1.88* 1.56* 1.58*  CALCIUM 8.1* 8.4* 8.9 8.2* 8.0*   GFR: Estimated Creatinine Clearance: 29.2 mL/min (A) (by C-G formula based on SCr of 1.58 mg/dL (H)). Liver Function Tests: Recent Labs  Lab 03/26/17 1342  AST 29  ALT 23  ALKPHOS 125  BILITOT 0.4  PROT 6.3*  ALBUMIN 2.2*   No results for input(s): LIPASE, AMYLASE in the last 168 hours. No results for input(s): AMMONIA in the last 168 hours. Coagulation Profile: No results for input(s): INR, PROTIME in the last 168 hours. Cardiac Enzymes: No results for input(s): CKTOTAL, CKMB, CKMBINDEX, TROPONINI in the last 168 hours. BNP (last 3 results) No results for input(s): PROBNP in the last 8760 hours. HbA1C: No results for input(s): HGBA1C in the last 72 hours. CBG: No results for input(s): GLUCAP in the last 168 hours. Lipid Profile: No results for input(s): CHOL, HDL, LDLCALC, TRIG, CHOLHDL, LDLDIRECT in the last 72 hours. Thyroid Function Tests: No results for input(s): TSH, T4TOTAL, FREET4, T3FREE, THYROIDAB in the last 72 hours. Anemia Panel: No results for input(s): VITAMINB12, FOLATE, FERRITIN, TIBC, IRON, RETICCTPCT in the last 72 hours. Urine analysis:    Component Value Date/Time   COLORURINE YELLOW 03/27/2017 1459   APPEARANCEUR CLEAR 03/27/2017 1459   LABSPEC 1.012 03/27/2017 1459    PHURINE 5.0 03/27/2017 1459   GLUCOSEU NEGATIVE 03/27/2017 1459   HGBUR NEGATIVE 03/27/2017 1459   BILIRUBINUR NEGATIVE 03/27/2017 1459   KETONESUR 5 (A) 03/27/2017 1459   PROTEINUR NEGATIVE 03/27/2017 1459   NITRITE NEGATIVE 03/27/2017 1459   LEUKOCYTESUR TRACE (A) 03/27/2017 1459   Sepsis Labs: @LABRCNTIP (procalcitonin:4,lacticidven:4)  ) No results found for this or any previous visit (from the past 240 hour(s)).       Radiology Studies: No results found.      Scheduled Meds: . feeding supplement (ENSURE ENLIVE)  237 mL Oral TID BM  . gabapentin  300 mg Oral TID  . mirtazapine  15 mg Oral QHS   Continuous Infusions: . sodium chloride       LOS: 1 day    Time spent: 29min    Domenic Polite, MD [facility] Page via [email or web address], password [email or web address] After 7PM please contact night-coverage  03/28/2017, 11:42 AM

## 2017-03-28 NOTE — Evaluation (Signed)
Physical Therapy Evaluation Patient Details Name: Ann Oliver MRN: [id number] DOB: 09-Nov-1955 Today's Date: 03/28/2017   History of Present Illness    61 y.o.femalewith medical history significant of metastasizedfungating breast mass currently on palliative chemotherapy, anemia,malnutrition, depression, leg edema, who presented with diarrhea, worsening leg edema. Med treatment for AKI, chronic diarrhea, chronic leukocytosis, wound care for breast mass, severe protein malnutrition, chronic BLA, depression.   Clinical Impression  Pt presents at/near baseline functional level, requiring no physical assistance for mobility ADLs including stair climbing.  Pt has assist for transportation and some meals, denies any concerns for independence when transitioned to home.  No PT indicated; please ask nursing to assist with short walks while in hospital to meet mobility needs and preserve function.      Follow Up Recommendations No PT follow up    Equipment Recommendations  None recommended by PT    Recommendations for Other Services       Precautions / Restrictions Precautions Precautions: Fall Precaution Comments: up with supervision Restrictions Other Position/Activity Restrictions: keep feet elevated when sitting/supine      Mobility  Bed Mobility Overal bed mobility: Independent                Transfers Overall transfer level: Independent                  Ambulation/Gait Ambulation/Gait assistance: Independent Ambulation Distance (Feet): 200 Feet Assistive device: None Gait Pattern/deviations: Step-through pattern        Stairs Stairs: Yes Stairs assistance: Supervision Stair Management: One rail Left;Forwards;Alternating pattern Number of Stairs: 10    Wheelchair Mobility    Modified Rankin (Stroke Patients Only)       Balance Overall balance assessment: No apparent balance deficits (not formally assessed)                                            Pertinent Vitals/Pain Pain Assessment: No/denies pain    Home Living Family/patient expects to be discharged to:: Private residence Living Arrangements: Alone Available Help at Discharge: Friend(s);Available PRN/intermittently Type of Home: Apartment Home Access: Stairs to enter Entrance Stairs-Rails: Left Entrance Stairs-Number of Steps: 10 Home Layout: One level Home Equipment: None Additional Comments: has transportation and stores near apt     Prior Function Level of Independence: Independent         Comments: cooks most meals, I w/ADLs     Hand Dominance   Dominant Hand: Right    Extremity/Trunk Assessment   Upper Extremity Assessment Upper Extremity Assessment: Overall WFL for tasks assessed    Lower Extremity Assessment Lower Extremity Assessment: Overall WFL for tasks assessed    Cervical / Trunk Assessment Cervical / Trunk Assessment: Normal  Communication   Communication: No difficulties  Cognition Arousal/Alertness: Awake/alert Behavior During Therapy: WFL for tasks assessed/performed Overall Cognitive Status: Within Functional Limits for tasks assessed                                        General Comments General comments (skin integrity, edema, etc.): mod edema; TED hose around ankles on arrival, pt educated to keep hose pulled up and no wrinkles; further educated to walk multiple short bouts in/outside as able, and keep feel elevated when sitting, perform ankle pumps and deep breathing to assist  with peripheral edema; pt teach back    Exercises     Assessment/Plan    PT Assessment Patent does not need any further PT services  PT Problem List         PT Treatment Interventions      PT Goals (Current goals can be found in the Care Plan section)  Acute Rehab PT Goals PT Goal Formulation: All assessment and education complete, DC therapy    Frequency     Barriers to discharge         Co-evaluation               AM-PAC PT "6 Clicks" Daily Activity  Outcome Measure Difficulty turning over in bed (including adjusting bedclothes, sheets and blankets)?: None Difficulty moving from lying on back to sitting on the side of the bed? : None Difficulty sitting down on and standing up from a chair with arms (e.g., wheelchair, bedside commode, etc,.)?: None Help needed moving to and from a bed to chair (including a wheelchair)?: None Help needed walking in hospital room?: None Help needed climbing 3-5 steps with a railing? : None 6 Click Score: 24    End of Session   Activity Tolerance: Patient tolerated treatment well Patient left: in bed;with call bell/phone within reach Nurse Communication: Mobility status PT Visit Diagnosis: Muscle weakness (generalized) (M62.81)    Time: 6808-8110 PT Time Calculation (min) (ACUTE ONLY): 20 min   Charges:   PT Evaluation $PT Eval Low Complexity: 1 Low     PT G Codes:        Ann Oliver, PT, DPT, MS Board Certified Geriatric Clinical Specialist  Herbie Drape 03/28/2017, 10:11 AM

## 2017-03-29 LAB — CBC
HCT: 26.9 % — ABNORMAL LOW (ref 36.0–46.0)
HCT: 25.5 % — ABNORMAL LOW (ref 36.0–46.0)
Hemoglobin: 8.2 g/dL — ABNORMAL LOW (ref 12.0–15.0)
Hemoglobin: 7.7 g/dL — ABNORMAL LOW (ref 12.0–15.0)
MCH: 24.8 pg — AB (ref 26.0–34.0)
MCH: 24.3 pg — ABNORMAL LOW (ref 26.0–34.0)
MCHC: 30.5 g/dL (ref 30.0–36.0)
MCHC: 30.2 g/dL (ref 30.0–36.0)
MCV: 81.3 fL (ref 78.0–100.0)
MCV: 80.4 fL (ref 78.0–100.0)
PLATELETS: 548 10*3/uL — AB (ref 150–400)
PLATELETS: 514 10*3/uL — AB (ref 150–400)
RBC: 3.31 MIL/uL — AB (ref 3.87–5.11)
RBC: 3.17 MIL/uL — ABNORMAL LOW (ref 3.87–5.11)
RDW: 18.1 % — AB (ref 11.5–15.5)
RDW: 17.9 % — AB (ref 11.5–15.5)
WBC: 19.5 10*3/uL — ABNORMAL HIGH (ref 4.0–10.5)
WBC: 21.1 10*3/uL — AB (ref 4.0–10.5)

## 2017-03-29 LAB — BASIC METABOLIC PANEL
Anion gap: 10 (ref 5–15)
BUN: 14 mg/dL (ref 6–20)
CALCIUM: 8.1 mg/dL — AB (ref 8.9–10.3)
CO2: 23 mmol/L (ref 22–32)
CREATININE: 1.42 mg/dL — AB (ref 0.44–1.00)
Chloride: 105 mmol/L (ref 101–111)
GFR calc Af Amer: 45 mL/min — ABNORMAL LOW (ref >60)
GFR, EST NON AFRICAN AMERICAN: 39 mL/min — AB (ref >60)
GLUCOSE: 101 mg/dL — AB (ref 65–99)
POTASSIUM: 4.1 mmol/L (ref 3.5–5.1)
SODIUM: 138 mmol/L (ref 135–145)

## 2017-03-29 MED ORDER — HEPARIN SOD (PORK) LOCK FLUSH 100 UNIT/ML IV SOLN
250.0000 [IU] | INTRAVENOUS | Status: AC | PRN
  Administered 2017-03-29: 250 [IU]

## 2017-03-29 NOTE — Care Management (Addendum)
[place] multiple home health agencies , all declining to provide home health RN. Patient aware. Text paged MD. Magdalen Spatz RN BSN       Jermain with [facility] called back, unable to accept referral, reason " care required is above home health".   Attempting to arrange another home health agency.   Paged MD.   Magdalen Spatz RN BSN [phone]

## 2017-03-29 NOTE — Progress Notes (Signed)
Patient c/o of dizziness and shaking earlier this day, with a blood pressure 207/178, and asked to get into bed she refused to relax, stating that we should not be allowing her go with her shaking and dizzy.  She was concern with her blood count, and wanted to make sure it was checked prior to her leaving.    She finally relaxed into bed and was given her pain medication and Dr. Broadus John was notified of inicident.  Her blood pressure was checked again, and back to baseline.  Dr.Joseph was notified, the [facility]. Pt was asked several time during the day, to get up and walk in the hall, and she refused.  She said she was dizzy.  At 6pm I went back into the room with her and and discussed about her going home this evening.  She advised that she was dizzy and was not going to home tonight.  She said she was not going to call for a ride.  She stated she was going to stay the night tonight and go home in the morning, because she was dizzy and would become dizzy tonight.  I notified Dr. Broadus John and Pt's discharge order for today was cancelled.

## 2017-03-29 NOTE — Consult Note (Signed)
[facility] Nurse wound consult note Reason for Consult: fungating breast tumor, patient well known to the [facility] nurse service  She has been using ED for her wound care, has follow up planned in the wound care center but is only seen monthly Wound type: neoplastic  Pressure Injury POA: NA Measurement: large 25cm x 25cm raised tumor (growth extends 7-8 cm in height) away from the skin Wound bed: fungating, cauliflower like tumor with yellow/black soft, non viable tissue over 75% of the surface of the tumor Drainage (amount, consistency, odor) heavy, with mild odor Periwound:intact  Dressing procedure/placement/frequency:  Covered tumor with xeroform gauze, used combat gauze along the underside of the tumor and over the medial tumor edge. Was not bleeding significantly today, mostly oozing from 10-12 o'clock on the tumor itself. Topped with 4 ABD pads, wrapped with kerlix (3) and 6" ACE wraps (2).   Dr. Broadus John into patient's room while I am beginning dressing change, request that patient have weekly visits for wound care. CM aware.   Discussed POC with patient and bedside nurse.  Re consult if needed, will not follow at this time. Thanks  Ann-Marie Kluge [employer], RN,CWOCN, CNS, [place] [phone])

## 2017-03-29 NOTE — Care Management Note (Signed)
Case Management Note  Patient Details  Name: Ann Oliver MRN: [id number] Date of Birth: 1955-08-21  Subjective/Objective:                    Action/Plan:  Spoke with Ann Oliver at [facility] . Patient has appointment tomorrow with Dr Con Memos, after that she will be scheduled weekly with Dr Dellia Nims. Patient aware and voiced understanding. Expected Discharge Date:                  Expected Discharge Plan:  [place]  In-House Referral:     Discharge planning Services  CM Consult  Post Acute Care Choice:  Home Health Choice offered to:  Patient(Attempted to contact pt via phone in room w/o success; will leave handoff for CM)  DME Arranged:    DME Agency:     HH Arranged:  [facility] [place] [facility] [place] Agency:  [facility]  Status of Service:  Completed, signed off  If discussed at [place] of Stay Meetings, dates discussed:    Additional Comments:  Marilu Favre, RN 03/29/2017, 10:10 AM

## 2017-03-30 ENCOUNTER — Encounter (HOSPITAL_BASED_OUTPATIENT_CLINIC_OR_DEPARTMENT_OTHER): Payer: Medicaid Other | Attending: Surgery

## 2017-03-30 DIAGNOSIS — D0502 Lobular carcinoma in situ of left breast: Secondary | ICD-10-CM | POA: Insufficient documentation

## 2017-03-30 DIAGNOSIS — L03313 Cellulitis of chest wall: Secondary | ICD-10-CM | POA: Insufficient documentation

## 2017-03-30 DIAGNOSIS — Z79899 Other long term (current) drug therapy: Secondary | ICD-10-CM | POA: Insufficient documentation

## 2017-03-30 MED ORDER — HEPARIN SOD (PORK) LOCK FLUSH 100 UNIT/ML IV SOLN
250.0000 [IU] | INTRAVENOUS | Status: AC | PRN
  Administered 2017-03-30: 250 [IU]

## 2017-03-30 MED ORDER — LOPERAMIDE HCL 2 MG PO CAPS: 2.0000 mg | ORAL_CAPSULE | ORAL | 0 refills | Status: DC | PRN

## 2017-03-30 MED ORDER — LOPERAMIDE HCL 2 MG PO CAPS
2.0000 mg | ORAL_CAPSULE | Freq: Once | ORAL | Status: AC
  Administered 2017-03-30: 2 mg via ORAL
  Filled 2017-03-30: qty 1

## 2017-03-30 NOTE — Progress Notes (Signed)
PROGRESS NOTE    Ann Oliver  SWF:[id number] DOB: April 05, 1956 DOA: 03/26/2017 PCP: [facility]  Brief Narrative:Ann Oliver is a 61 y.o. 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 Patient was recently hospitalized from 10/25-10/30 due to acute bleeding from the fungating breast tumor, leukocytosis and breat cancer related pain. Pt was transfused with 2 unit of blood. ID was consulted for leukocytosis, and ID did not think wound is infected, so abx stopped 10/29. Pt states that she has been having foot and leg edema. During last hospitalization, she was found to have acute kidney injury and was taken off Lasix   Assessment & Plan:   AKI (acute kidney injury) ([place]):  -Mild secondary to chronic diarrhea and dehydration, diuretics  -Hydrated with normal saline, creatinine improving nd stable now -improved, stopped OVF  Chronic diarrhea -Long-standing issue -Secondary to her chemotherapy/Taxol (40% pts have diarrhea from this) -Continue Imodium, changed to PRN  Chronic leukocytosis -Baseline white count is in the 17-22,000 range, this is likely secondary to her chronically fungating breast mass and cancer -No other signs of active infection,  in fact was just seen by infectious disease 5 days ago At [place] and antibiotics stopped then  Large fungating breast mass/metastatic breast cancer -Followed by Dr. Sonny Dandy, on palliative chemotherapy with Taxol -Followed at the wound center monthly, and apparently he used to get home health RN services for wound care -CMRN set up weekly appt at [place] RN to follow as well  Chronic blood loss anemia -Due to large fungating breast mass with bleeding, required 2 units of PRBC last week at [facility] -Monitor CBC closely -Also has anemia of  chronic disease due to malignancy and chemotherapy -Hemoglobin down to 7.7 range, declined blood transfusion  Edema: -I suspect this is secondary to third spacing and hypoalbuminemia, malnutrition and advanced cancer -Encouraged to increase protein intake, stopped IV fluids today -not a good candidate for daily lasix with chronic diarrhea and soft BPs -recommend TED hose  Severe protein calorie malnutrition -Supplements as tolerated  Depression: Stable, continue Remeron  DVT ppx: SCD Code Status: Full code Family Communication: None at bed side.    Disposition Plan: Home today if stable    Procedures:   Antimicrobials:    Subjective: -diarrhea is better, no dressing changed as far  Objective: Vitals:   03/29/17 1330 03/29/17 1446 03/29/17 2118 03/30/17 0612  BP: (!) 107/55 110/60 117/62 107/61  Pulse:  82 99 84  Resp:   16 16  Temp:  98.4 F (36.9 C) 99 F (37.2 C) 99 F (37.2 C)  TempSrc:  Oral Oral Oral  SpO2:  99% 100% 99%  Weight:    53.4 kg (117 lb 12 oz)  Height:        Intake/Output Summary (Last 24 hours) at 03/30/2017 1010 Last data filed at 03/30/2017 0946 Gross per 24 hour  Intake 750 ml  Output 0 ml  Net 750 ml   Filed Weights   03/28/17 0500 03/29/17 0500 03/30/17 0612  Weight: 53.8 kg (118 lb 8 oz) 53.6 kg (118 lb 2.7 oz) 53.4 kg (117 lb 12 oz)    Examination:  Gen: Awake, Alert, Oriented X 3, frail cachectic female, no distress HEENT: PERRLA, Neck supple, no JVD Lungs: Good air movement bilaterally, CTAB Chest: huge dresing over chest wall CVS: RRR,No Gallops,Rubs or new Murmurs Abd:  soft, Non tender, non distended, BS present Extremities: No Cyanosis, Clubbing or edema Skin: no new rashes Psychiatry: Judgement and insight appear normal. Mood & affect appropriate.     Data Reviewed:   CBC: Recent Labs  Lab 03/26/17 1342 03/27/17 0641 03/28/17 0401 03/29/17 0330 03/29/17 1603  WBC 20.9* 20.0* 15.3* 19.5* 21.1*  NEUTROABS  19.1*  --   --   --   --   HGB 8.9* 7.9* 7.8* 8.2* 7.7*  HCT 29.5* 26.1* 26.0* 26.9* 25.5*  MCV 82.2 81.6 81.8 81.3 80.4  PLT 558* 513* 527* 548* 416*   Basic Metabolic Panel: Recent Labs  Lab 03/24/17 0422 03/26/17 1342 03/27/17 0641 03/28/17 0401 03/29/17 0330  NA 141 138 139 137 138  K 3.6 4.8 4.2 4.2 4.1  CL 106 107 107 104 105  CO2 23 25 24 25 23   GLUCOSE 163* 96 75 80 101*  BUN 11 17 13 12 14   CREATININE 1.39* 1.88* 1.56* 1.58* 1.42*  CALCIUM 8.4* 8.9 8.2* 8.0* 8.1*   GFR: Estimated Creatinine Clearance: 32.5 mL/min (A) (by C-G formula based on SCr of 1.42 mg/dL (H)). Liver Function Tests: Recent Labs  Lab 03/26/17 1342  AST 29  ALT 23  ALKPHOS 125  BILITOT 0.4  PROT 6.3*  ALBUMIN 2.2*   No results for input(s): LIPASE, AMYLASE in the last 168 hours. No results for input(s): AMMONIA in the last 168 hours. Coagulation Profile: No results for input(s): INR, PROTIME in the last 168 hours. Cardiac Enzymes: No results for input(s): CKTOTAL, CKMB, CKMBINDEX, TROPONINI in the last 168 hours. BNP (last 3 results) No results for input(s): PROBNP in the last 8760 hours. HbA1C: No results for input(s): HGBA1C in the last 72 hours. CBG: No results for input(s): GLUCAP in the last 168 hours. Lipid Profile: No results for input(s): CHOL, HDL, LDLCALC, TRIG, CHOLHDL, LDLDIRECT in the last 72 hours. Thyroid Function Tests: No results for input(s): TSH, T4TOTAL, FREET4, T3FREE, THYROIDAB in the last 72 hours. Anemia Panel: No results for input(s): VITAMINB12, FOLATE, FERRITIN, TIBC, IRON, RETICCTPCT in the last 72 hours. Urine analysis:    Component Value Date/Time   COLORURINE YELLOW 03/27/2017 1459   APPEARANCEUR CLEAR 03/27/2017 1459   LABSPEC 1.012 03/27/2017 1459   PHURINE 5.0 03/27/2017 1459   GLUCOSEU NEGATIVE 03/27/2017 1459   HGBUR NEGATIVE 03/27/2017 1459   BILIRUBINUR NEGATIVE 03/27/2017 1459   KETONESUR 5 (A) 03/27/2017 1459   PROTEINUR NEGATIVE  03/27/2017 1459   NITRITE NEGATIVE 03/27/2017 1459   LEUKOCYTESUR TRACE (A) 03/27/2017 1459   Sepsis Labs: @LABRCNTIP (procalcitonin:4,lacticidven:4)  ) No results found for this or any previous visit (from the past 240 hour(s)).       Radiology Studies: No results found.      Scheduled Meds: . feeding supplement (ENSURE ENLIVE)  237 mL Oral TID BM  . gabapentin  300 mg Oral TID  . loperamide  2 mg Oral Once  . mirtazapine  15 mg Oral QHS   Continuous Infusions: . sodium chloride       LOS: 3 days    Time spent: 59min    Domenic Polite, MD [facility] Page via [email or web address], password [email or web address] After 7PM please contact night-coverage  03/30/2017, 10:10 AM

## 2017-03-30 NOTE — Progress Notes (Signed)
Went over discharge instructions with pt with no concerns voiced. Pt given taxi voucher after expressing concerns regarding transportation home. AVS given to pt, taxi called and pt waiting for her ride home

## 2017-03-30 NOTE — Discharge Summary (Signed)
Physician Discharge Summary  Ann Oliver IOM:[id number] DOB: March 15, 1956 DOA: 03/26/2017  PCP: [facility]  Admit date: 03/26/2017 Discharge date: 03/30/2017  Time spent: 35 minutes  Recommendations for Outpatient Follow-up:  1. Dr.Gudena at [place] in 1 week, missed chemo on 11/2, may need transfusion, declined blood this admission 2. Stopped lasix due to recurrent AKI and chronic diarrhea, despite lower ext edema 3. Wound Center tomorrow am at 9:45, requested Weekly appointments for dressing changes/wound care 4. Home Health Nursing set up again   Discharge Diagnoses:  Principal Problem:   AKI (acute kidney injury) ([place])   Chronic diarrhea   Chronic leukocytosis   Massive Fungating breast CA   Depressed   Breast cancer of upper-outer quadrant of left female breast ([place])   Breast cancer metastasized to lung, right (H[facility])   Malnutrition of moderate degree   Leg swelling   Diarrhea   Leukocytosis   Chronic blood loss Anemia    Discharge Condition: stable  Diet recommendation: low sodium  Filed Weights   03/28/17 0500 03/29/17 0500 03/30/17 0612  Weight: 53.8 kg (118 lb 8 oz) 53.6 kg (118 lb 2.7 oz) 53.4 kg (117 lb 12 oz)    History of present illness:  Ann Gregoryis a 61 y.o.femalewith medical history significant of metastasizedfungating breast mass currently on palliative chemotherapy, anemia,malnutrition, depression, leg edema, who presented with diarrhea, worsening leg edema. Patient was recently hospitalized from 10/25-10/30 due to acute bleeding from the fungating breast tumor, leukocytosis and breat cancer related pain. Pt was transfused with 2 unit of blood. ID was consulted for leukocytosis, and ID did not think wound is infected,so abx stopped 10/29. Pt states that she has been having foot and leg edema. During last hospitalization, she was found to have acute kidney injury and was taken off Lasix  Hospital Course:  AKI  (acute kidney injury) ([place]):  -Mild secondary to chronic diarrhea and dehydration, diuretics  -Hydrated with normal saline, creatinine improving  -stable now around 1.5, stopped fluids  Chronic diarrhea -Long-standing issue -Secondary to her chemotherapy/Taxol (40% pts have diarrhea from this) and malabsorption likely -Continue Imodium, advised to take this PRN  Chronic leukocytosis -Baseline white count is in the 17-22,000 range, this is likely secondary to her chronically fungating breast mass and cancer -No other signs of active infection,  in fact was just seen by infectious disease 5 days ago At [facility] [place] and antibiotics stopped then  Large fungating breast mass/metastatic breast cancer -Followed by Dr. Sonny Dandy, on palliative chemotherapy with Taxol -Followed at the wound center monthly, and apparently he used to get home health RN services for wound care -was going to the ED for dressing changes, we requested and set up weekly appointments at the wound center and set up [facility] again to supplement this -missed last chemo on 11/2, advised FU in 1 week, may need blood too then  Chronic blood loss anemia -Due to large fungating breast mass with bleeding, required 2 units of PRBC last week at [place] long -Also has anemia of chronic disease due to malignancy and chemotherapy -Hemoglobin down to 7.8 range, declined blood transfusion, will likely need this at FU at the cancer center  Edema: -I suspect this is secondary to third spacing and hypoalbuminemia, malnutrition and advanced cancer -Encouraged to increase protein intake -not a good candidate for daily lasix with chronic diarrhea and soft BPs -recommend TED hose and low salt intake  Severe protein calorie malnutrition -Supplements as tolerated  Depression:Stable, continue Remeron  Discharge Exam: Vitals:   03/30/17 0612 03/30/17 1422  BP: 107/61 110/64  Pulse: 84 82  Resp: 16 16  Temp: 99 F (37.2 C) 98.7  F (37.1 C)  SpO2: 99% 97%    General: AAOx3, frail cachectic female Cardiovascular: S1S2/RRR Respiratory: CTAB  Discharge Instructions   Discharge Instructions    Discharge instructions   Complete by:  As directed      Current Discharge Medication List    CONTINUE these medications which have CHANGED   Details  loperamide (IMODIUM) 2 MG capsule Take 1 capsule (2 mg total) as needed by mouth for diarrhea or loose stools. Take as directed on package Qty: 30 capsule, Refills: 0      CONTINUE these medications which have NOT CHANGED   Details  feeding supplement, ENSURE ENLIVE, (ENSURE ENLIVE) LIQD Take 237 mLs by mouth 3 (three) times daily between meals. Qty: 30 Bottle, Refills: 0    gabapentin (NEURONTIN) 300 MG capsule Take 1 capsule (300 mg total) by mouth 3 (three) times daily. Qty: 90 capsule, Refills: 0    mirtazapine (REMERON SOL-TAB) 15 MG disintegrating tablet Take 1 tablet (15 mg total) by mouth at bedtime. Qty: 30 tablet, Refills: 1    oxyCODONE-acetaminophen (PERCOCET/ROXICET) 5-325 MG tablet Take 2 tablets by mouth every 4 (four) hours as needed for severe pain. Qty: 180 tablet, Refills: 0    hydrocortisone (ANUSOL-HC) 25 MG suppository Place 1 suppository (25 mg total) rectally 2 (two) times daily as needed for hemorrhoids or anal itching. Qty: 12 suppository, Refills: 0   Associated Diagnoses: Breast cancer metastasized to lung, right ([place]); Other hemorrhoids    ondansetron (ZOFRAN) 8 MG tablet Take 1 tablet (8 mg total) by mouth every 8 (eight) hours as needed for nausea or vomiting. Qty: 20 tablet, Refills: 0   Associated Diagnoses: Breast cancer metastasized to lung, right ([place]); Malignant neoplasm of upper-outer quadrant of left breast in female, estrogen receptor positive ([place])      STOP taking these medications     pramoxine (PROCTOFOAM) 1 % foam        Allergies  Allergen Reactions  . Aspirin Palpitations   Follow-up Information     [facility] Follow up.   Specialty:  Home Health Services Why:  Will provide home health nurse Contact information: [place] [address] [phone]        [facility] [place] Follow up.   Specialty:  Wound Care Why:  March 31, 2017 at 0945 am  Contact information: [place], [address] [id number] [place]       Nicholas Lose, MD. Schedule an appointment as soon as possible for a visit in 1 week(s).   Specialty:  Hematology and Oncology Contact information: [place] [address] [phone]            The results of significant diagnostics from this hospitalization (including imaging, microbiology, ancillary and laboratory) are listed below for reference.    Significant Diagnostic Studies: Dg Chest 2 View  Result Date: 03/08/2017 CLINICAL DATA:  Leg swelling, increasing for 1 week. EXAM: CHEST  2 VIEW COMPARISON:  11/04/2016 FINDINGS: A new right PICC terminates over the lower SVC. The cardiomediastinal silhouette is within normal limits. Aortic atherosclerosis is noted. Multiple pulmonary nodules are again noted bilaterally, with some of the nodules less well seen than on the prior study suggesting an interval decrease in size. The lungs are slightly less  well inflated than on the prior study with minimal opacity in the left lung base, likely atelectasis. No lobar consolidation, edema, pleural effusion, or pneumothorax is identified. Large left breast/chest wall mass is partially visualized. No acute osseous abnormality is seen. IMPRESSION: 1. Mild hypoinflation with minimal left basilar atelectasis. 2. Decreased size of pulmonary metastases. Electronically Signed   By: Logan Bores M.D.   On: 03/08/2017 16:57   Dg Chest Portable 1 View  Result Date: 03/18/2017 CLINICAL DATA:  LEFT chest pain. EXAM: PORTABLE CHEST 1 VIEW COMPARISON:  Chest radiograph  March 08, 2017 and CT chest October 28, 2016 FINDINGS: Limited assessment LEFT lung due to overlying dense LEFT breast mass. Patient rotated to the RIGHT. Cardiomediastinal silhouette is normal. Known pulmonary metastasis better characterized on prior CT. No pleural effusion or focal consolidation. LEFT apical pleural thickening. No pneumothorax. RIGHT PICC distal tip projects the cavoatrial junction. Osseous structures are unchanged. IMPRESSION: No acute cardiopulmonary process. Large LEFT chest wall mass. Stable RIGHT PICC distal tip at cavoatrial junction. Electronically Signed   By: Elon Alas M.D.   On: 03/18/2017 06:35    Microbiology: No results found for this or any previous visit (from the past 240 hour(s)).   Labs: Basic Metabolic Panel: Recent Labs  Lab 03/24/17 0422 03/26/17 1342 03/27/17 0641 03/28/17 0401 03/29/17 0330  NA 141 138 139 137 138  K 3.6 4.8 4.2 4.2 4.1  CL 106 107 107 104 105  CO2 23 25 24 25 23   GLUCOSE 163* 96 75 80 101*  BUN 11 17 13 12 14   CREATININE 1.39* 1.88* 1.56* 1.58* 1.42*  CALCIUM 8.4* 8.9 8.2* 8.0* 8.1*   Liver Function Tests: Recent Labs  Lab 03/26/17 1342  AST 29  ALT 23  ALKPHOS 125  BILITOT 0.4  PROT 6.3*  ALBUMIN 2.2*   No results for input(s): LIPASE, AMYLASE in the last 168 hours. No results for input(s): AMMONIA in the last 168 hours. CBC: Recent Labs  Lab 03/26/17 1342 03/27/17 0641 03/28/17 0401 03/29/17 0330 03/29/17 1603  WBC 20.9* 20.0* 15.3* 19.5* 21.1*  NEUTROABS 19.1*  --   --   --   --   HGB 8.9* 7.9* 7.8* 8.2* 7.7*  HCT 29.5* 26.1* 26.0* 26.9* 25.5*  MCV 82.2 81.6 81.8 81.3 80.4  PLT 558* 513* 527* 548* 514*   Cardiac Enzymes: No results for input(s): CKTOTAL, CKMB, CKMBINDEX, TROPONINI in the last 168 hours. BNP: BNP (last 3 results) Recent Labs    03/26/17 2043  BNP 199.3*    ProBNP (last 3 results) No results for input(s): PROBNP in the last 8760 hours.  CBG: No results for input(s):  GLUCAP in the last 168 hours.     SignedDomenic Polite MD.  [facility] 03/30/2017, 3:47 PM

## 2017-03-30 NOTE — Care Management Note (Addendum)
Case Management Note  Patient Details  Name: Ann Oliver MRN: [id number] Date of Birth: [birth year]  Subjective/Objective:                    Action/Plan: Added PICC line care [place] with [facility] has accepted referral.    Appointment for wound center cancelled for today and rescheduled for tomorrow Mar 31, 2017 at 0945 am .   Expected Discharge Date:  03/29/17               Expected Discharge Plan:  [place]  In-House Referral:     Discharge planning Services  CM Consult  Post Acute Care Choice:  Home Health Choice offered to:  Patient(Attempted to contact pt via phone in room w/o success; will leave handoff for CM)  DME Arranged:    DME Agency:     HH Arranged:  RN [place] Agency:     Status of Service:  Completed, signed off  If discussed at [place] of Stay Meetings, dates discussed:    Additional Comments:  Marilu Favre, RN 03/30/2017, 9:52 AM

## 2017-03-30 NOTE — Progress Notes (Signed)
Pt discharged home in stable condition,left via taxi

## 2017-03-30 NOTE — Social Work (Signed)
CSW spoke with RN who advised that patient will need assistance with transport home.  CSW provided tax voucher.  CSW will sign off for now as social work intervention is no longer needed. Please consult [place] again if new need arises.  Elissa Hefty, LCSW Clinical Social Worker [phone]

## 2017-04-01 ENCOUNTER — Telehealth: Payer: Self-pay

## 2017-04-01 DIAGNOSIS — Z79899 Other long term (current) drug therapy: Secondary | ICD-10-CM | POA: Diagnosis not present

## 2017-04-01 DIAGNOSIS — D0502 Lobular carcinoma in situ of left breast: Secondary | ICD-10-CM | POA: Diagnosis present

## 2017-04-01 DIAGNOSIS — L03313 Cellulitis of chest wall: Secondary | ICD-10-CM | POA: Diagnosis not present

## 2017-04-01 NOTE — Telephone Encounter (Signed)
Ann Oliver walked in today requesting a picc line change. I informed [name] that it was not due today, it was just changed on the 5th. She did go to the wound clinic and had her dressing changed today. [facility] nurse went out yesterday and [name] refused care. We explained to [name] this is a new Higher education careers adviser and it may not always be the same nurse but they are all capable of doing her wound dressing change and her picc line dressing change and if she had any concerns to have the nurse call us prior to her leaving her house. No further questions at this time.  Cyndia Bent RN

## 2017-04-01 NOTE — Telephone Encounter (Signed)
Called [place] from [facility] for verbal approval for services for [employer]. Direct line provided for any further questions or concerns.  Cyndia Bent RN

## 2017-04-02 ENCOUNTER — Telehealth: Payer: Self-pay

## 2017-04-02 ENCOUNTER — Emergency Department (HOSPITAL_COMMUNITY)
Admission: EM | Admit: 2017-04-02 | Discharge: 2017-04-03 | Disposition: A | Discharge status: Home / Self Care | Payer: Medicaid Other | Attending: Emergency Medicine | Admitting: Emergency Medicine

## 2017-04-02 DIAGNOSIS — C7801 Secondary malignant neoplasm of right lung: Secondary | ICD-10-CM | POA: Insufficient documentation

## 2017-04-02 DIAGNOSIS — C7800 Secondary malignant neoplasm of unspecified lung: Principal | ICD-10-CM

## 2017-04-02 DIAGNOSIS — N6459 Other signs and symptoms in breast: Secondary | ICD-10-CM | POA: Diagnosis present

## 2017-04-02 DIAGNOSIS — R58 Hemorrhage, not elsewhere classified: Secondary | ICD-10-CM | POA: Diagnosis not present

## 2017-04-02 DIAGNOSIS — Z79899 Other long term (current) drug therapy: Secondary | ICD-10-CM | POA: Diagnosis not present

## 2017-04-02 DIAGNOSIS — C50911 Malignant neoplasm of unspecified site of right female breast: Secondary | ICD-10-CM

## 2017-04-02 DIAGNOSIS — C50412 Malignant neoplasm of upper-outer quadrant of left female breast: Secondary | ICD-10-CM | POA: Insufficient documentation

## 2017-04-02 LAB — CBC WITH DIFFERENTIAL/PLATELET
BASOS ABS: 0 10*3/uL (ref 0.0–0.1)
Basophils Relative: 0 %
EOS ABS: 0.1 10*3/uL (ref 0.0–0.7)
EOS PCT: 0 %
HCT: 26.5 % — ABNORMAL LOW (ref 36.0–46.0)
Hemoglobin: 8.2 g/dL — ABNORMAL LOW (ref 12.0–15.0)
Lymphocytes Relative: 5 %
Lymphs Abs: 0.9 10*3/uL (ref 0.7–4.0)
MCH: 24.8 pg — ABNORMAL LOW (ref 26.0–34.0)
MCHC: 30.9 g/dL (ref 30.0–36.0)
MCV: 80.3 fL (ref 78.0–100.0)
MONO ABS: 0.8 10*3/uL (ref 0.1–1.0)
Monocytes Relative: 5 %
Neutro Abs: 14.9 10*3/uL — ABNORMAL HIGH (ref 1.7–7.7)
Neutrophils Relative %: 90 %
PLATELETS: 622 10*3/uL — AB (ref 150–400)
RBC: 3.3 MIL/uL — AB (ref 3.87–5.11)
RDW: 18.9 % — AB (ref 11.5–15.5)
WBC: 16.7 10*3/uL — AB (ref 4.0–10.5)

## 2017-04-02 LAB — PROTIME-INR
INR: 1.07
PROTHROMBIN TIME: 13.8 s (ref 11.4–15.2)

## 2017-04-02 MED ORDER — LIDOCAINE-EPINEPHRINE (PF) 2 %-1:200000 IJ SOLN
INTRAMUSCULAR | Status: AC
  Filled 2017-04-02: qty 20

## 2017-04-02 MED ORDER — HEPARIN SOD (PORK) LOCK FLUSH 100 UNIT/ML IV SOLN
500.0000 [IU] | Freq: Once | INTRAVENOUS | Status: AC
  Administered 2017-04-02: 500 [IU]
  Filled 2017-04-02: qty 5

## 2017-04-02 MED ORDER — ONDANSETRON HCL 4 MG/2ML IJ SOLN
4.0000 mg | Freq: Once | INTRAMUSCULAR | Status: DC
  Filled 2017-04-02: qty 2

## 2017-04-02 MED ORDER — GABAPENTIN 300 MG PO CAPS
300.0000 mg | ORAL_CAPSULE | Freq: Once | ORAL | Status: AC
  Administered 2017-04-02: 300 mg via ORAL
  Filled 2017-04-02: qty 1

## 2017-04-02 MED ORDER — FENTANYL CITRATE (PF) 100 MCG/2ML IJ SOLN
50.0000 ug | INTRAMUSCULAR | Status: DC | PRN
Start: 2017-04-02 — End: 2017-04-03
  Administered 2017-04-02: 50 ug via INTRAVENOUS
  Filled 2017-04-02: qty 2

## 2017-04-02 MED ORDER — TRANEXAMIC ACID 1000 MG/10ML IV SOLN: 500.0000 mg | Freq: Once | INTRAVENOUS | Status: DC

## 2017-04-02 NOTE — ED Notes (Signed)
Dr. Jeneen Rinks at bedside-dressing removed-patient has a large fungating necrotic tumor lateral left breast-bleeding at top of tumor-Dr. Jeneen Rinks injected lidocaine with epi-wound seal and combat gauze applied with pressure and bleeding controlled. Vaseline gauze to surface of tumor abd pads and 4x4 dressings applied and wrapped with 4 large ace wraps around body.

## 2017-04-02 NOTE — ED Notes (Signed)
Bed: WA17 Expected date:  Expected time:  Means of arrival:  Comments: EMS 61 yo female  From a parking lot-cancer patient-breast bleeding

## 2017-04-02 NOTE — Telephone Encounter (Signed)
Sent high priority scheduling message for a lab, flush, and 3 hr infusion for treatment next week.  Cyndia Bent RN

## 2017-04-02 NOTE — Telephone Encounter (Signed)
Spoke with [facility] RN with [facility], explained to her that if she had any questions about [place] while she was at her home she could call our direct line.  Cyndia Bent RN

## 2017-04-02 NOTE — ED Triage Notes (Signed)
Pt comes from home, actively bleeding from left mast infection, reporting pain. Hx of bleeding from site. Pt has port in her arm.  V/s on arrival 118/62, pluse 80, sp02, 100,  Hx of breast cancer/ tumor in left arm.  Alert

## 2017-04-02 NOTE — Discharge Instructions (Signed)
Continue home medications and pain medications

## 2017-04-02 NOTE — Telephone Encounter (Signed)
Reached out and left a VM to [place] regarding her prescription for Remeron that was picked up from her pharmacy on November 1st. We need to find out if she has misplaced it or is just unsure of the medication and needs education on it. We cannot fill this medication since it has been filled and picked it. Advised her to call back.  Cyndia Bent RN

## 2017-04-02 NOTE — ED Provider Notes (Signed)
[facility] DEPT Provider Note   CSN: [id number] Arrival date & time: 04/02/17  2132     History   Chief Complaint Chief Complaint  Patient presents with  . Coagulation Disorder    tissue arterial bleed wrapped     HPI Ann Oliver is a 61 y.o. female. Chief complaint is bleeding from breast tumor.  HPI 62 year old female with advanced stage breast mass. Seen here frequently with bleeding. Had some bleeding at home tonight. Is in wound care twice per week and has home health care nursing to 3 days per week for dressing changes.  Past Medical History:  Diagnosis Date  . Cancer ([place])   . Sinusitis   . Tumor cells     Patient Active Problem List   Diagnosis Date Noted  . AKI (acute kidney injury) ([place]) 03/26/2017  . Leg swelling 03/26/2017  . Diarrhea 03/26/2017  . Leukocytosis 03/26/2017  . Anemia 03/26/2017  . Acute bleeding 03/18/2017  . Breast wound 03/18/2017  . Blood loss anemia 01/18/2017  . Port catheter in place 12/02/2016  . Necrotizing soft tissue infection 11/21/2016  . Cellulitis 10/28/2016  . Wound infection 10/28/2016  . Cellulitis of left breast 10/28/2016  . Encounter for palliative care   . Goals of care, counseling/discussion   . 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 Malnutrition of moderate degree 09/25/2016  . Breast cancer of upper-outer quadrant of left female breast ([place]) 10/22/2015  . THYROID NODULE, RIGHT 05/31/2007  . Depressed 05/31/2007  . GERD 05/31/2007  . HOT FLASHES 05/31/2007  . HEADACHE 05/31/2007    Past Surgical History:  Procedure Laterality Date  . BIOPSY BREAST    . IR FLUORO GUIDE CV LINE RIGHT  11/06/2016  . IR US GUIDE VASC ACCESS RIGHT  11/06/2016    OB History    No data available       Home Medications    Prior to Admission medications   Medication Sig Start Date End Date Taking? Authorizing Provider  feeding supplement, ENSURE ENLIVE, (ENSURE  ENLIVE) LIQD Take 237 mLs by mouth 3 (three) times daily between meals. 09/26/16  Yes [name], MD  gabapentin (NEURONTIN) 300 MG capsule Take 1 capsule (300 mg total) by mouth 3 (three) times daily. 03/24/17  Yes Bonnielee Haff, MD  loperamide (IMODIUM) 2 MG capsule Take 1 capsule (2 mg total) as needed by mouth for diarrhea or loose stools. Take as directed on package 03/30/17  Yes Domenic Polite, MD  mirtazapine (REMERON SOL-TAB) 15 MG disintegrating tablet Take 1 tablet (15 mg total) by mouth at bedtime. 03/24/17  Yes Bonnielee Haff, MD  oxyCODONE-acetaminophen (PERCOCET/ROXICET) 5-325 MG tablet Take 2 tablets by mouth every 4 (four) hours as needed for severe pain. 03/24/17  Yes Bonnielee Haff, MD  hydrocortisone (ANUSOL-HC) 25 MG suppository Place 1 suppository (25 mg total) rectally 2 (two) times daily as needed for hemorrhoids or anal itching. Patient not taking: Reported on 03/15/2017 03/05/17   Gardenia Phlegm, NP  ondansetron (ZOFRAN) 8 MG tablet Take 1 tablet (8 mg total) by mouth every 8 (eight) hours as needed for nausea or vomiting. Patient not taking: Reported on 03/15/2017 03/05/17   Gardenia Phlegm, NP    Family History Family History  Problem Relation Age of Onset  . Breast cancer Neg Hx     Social History Social History   Tobacco Use  . Smoking status: Never Smoker  . Smokeless tobacco: Never Used  Substance  Use Topics  . Alcohol use: No  . Drug use: No     Allergies   Aspirin   Review of Systems Review of Systems  Constitutional: Negative for appetite change, chills, diaphoresis, fatigue and fever.  HENT: Negative for mouth sores, sore throat and trouble swallowing.   Eyes: Negative for visual disturbance.  Respiratory: Negative for cough, chest tightness, shortness of breath and wheezing.        Large pedunculated left fungating breast mass with mucosal bleeding from the surface at the 10 11:00 position.  Cardiovascular: Negative  for chest pain.  Gastrointestinal: Negative for abdominal distention, abdominal pain, diarrhea, nausea and vomiting.  Endocrine: Negative for polydipsia, polyphagia and polyuria.  Genitourinary: Negative for dysuria, frequency and hematuria.  Musculoskeletal: Negative for gait problem.  Skin: Negative for color change, pallor and rash.  Neurological: Negative for dizziness, syncope, light-headedness and headaches.  Hematological: Does not bruise/bleed easily.  Psychiatric/Behavioral: Negative for behavioral problems and confusion.     Physical Exam Updated Vital Signs BP 111/62   Pulse (!) 116   Temp 97.9 F (36.6 C) (Oral)   Resp 16   SpO2 94%   Physical Exam Gen: Awake and alert. Eyes: Conjunctiva not pale. Oropharynx: Pink and moist. Neck supple Lungs clear. Heart: Not tachycardic. Abdomen soft and benign. Left breast with fungating mass in capillary bleeding noted. No dependent edema. Neuro awake and alert.  ED Treatments / Results  Labs (all labs ordered are listed, but only abnormal results are displayed) Labs Reviewed  CBC WITH DIFFERENTIAL/PLATELET - Abnormal; Notable for the following components:      Result Value   WBC 16.7 (*)    RBC 3.30 (*)    Hemoglobin 8.2 (*)    HCT 26.5 (*)    MCH 24.8 (*)    RDW 18.9 (*)    Platelets 622 (*)    Neutro Abs 14.9 (*)    All other components within normal limits  PROTIME-INR    EKG  EKG Interpretation None       Radiology No results found.  Procedures Procedures (including critical care time)  Medications Ordered in ED Medications  lidocaine-EPINEPHrine (XYLOCAINE W/EPI) 2 %-1:200000 (PF) injection (not administered)  fentaNYL (SUBLIMAZE) injection 50 mcg (50 mcg Intravenous Given 04/02/17 2233)  ondansetron (ZOFRAN) injection 4 mg (4 mg Intravenous Not Given 04/02/17 2234)  gabapentin (NEURONTIN) capsule 300 mg (not administered)  heparin lock flush 100 unit/mL (500 Units Intracatheter Given 04/02/17  2251)     Initial Impression / Assessment and Plan / ED Course  I have reviewed the triage vital signs and the nursing notes.  Pertinent labs & imaging results that were available during my care of the patient were reviewed by me and considered in my medical decision making (see chart for details).    Coagulation obtained with topical coagulant, calm back gauze and local injection 1% lidocaine with epi. Dressed with nonocclusive dressing. Hemoglobin is stable and actually a few points of her most recent. She seemed medically stable. Plan is discharge home continue home and wound care  Final Clinical Impressions(s) / ED Diagnoses   Final diagnoses:  Bleeding    ED Discharge Orders    None       Tanna Furry, MD 04/02/17 2340

## 2017-04-05 ENCOUNTER — Other Ambulatory Visit: Payer: Self-pay

## 2017-04-05 ENCOUNTER — Telehealth: Payer: Self-pay | Admitting: Hematology and Oncology

## 2017-04-05 MED ORDER — GABAPENTIN 600 MG PO TABS: 600.0000 mg | ORAL_TABLET | Freq: Three times a day (TID) | ORAL | 1 refills | Status: DC

## 2017-04-05 NOTE — Progress Notes (Signed)
Received call from [facility] from wound care center this morning and notified this RN that [facility] nurse was unable to visit pt today. [place] nurse was knocking at pt door this morning but pt refused to open the door.    Called pt to find out why she did not let the [facility] nurse in this morning to do her picc and wound dressing. Pt stated that when she spoke with the nurse prior to her coming, she was trying to tell her that she didn't feel like getting her dressing changed today, due to her nerve pain and unable to medicate because she had ran out of gabapentin.   Pt reports that her gabapentin was not really helping, and requesting a higher dose. Pt states that she is unable to have her dressing change until her pain is under control. Spoke and discuss issue with Dr.Gudena and obtained a dose change for pt. Gabapentin medication.  [facility] a e script to [facility] for 600mg  TID of gabapentin with 1 refill. Called pt to confirm this new dose change and that she may pick up her medication at [facility] today. Pt verbalized understanding.  Told pt that this RN will contact [facility] home health (spoke with Connie,RN) and to let them know that pt is willing to have a dressing change visit this week. Also, informed them that pt needs to be contacted and that she will need to know the exact time that [facility] RN will be at her door, so she knows who is coming. Pt also advised that [facility] RN given instructions to call Dr.Gudena's nurse for any questions or clarification, while they are in pt home doing dressing changes or have difficulty with understanding pt. Pt verbalized understanding.  Pt reminded of upcoming lab/fl/inf appt this Friday 11/16 for her 5th chemo treatment. Pt verbally confirmed time/date. No further concerns or questions at this time.

## 2017-04-05 NOTE — Telephone Encounter (Signed)
Left message for patient regarding appt added per 11/9 sch msg.

## 2017-04-09 ENCOUNTER — Other Ambulatory Visit: Payer: Self-pay

## 2017-04-09 ENCOUNTER — Ambulatory Visit: Payer: Self-pay

## 2017-04-09 ENCOUNTER — Telehealth: Payer: Self-pay

## 2017-04-09 ENCOUNTER — Other Ambulatory Visit: Payer: Self-pay | Admitting: Hematology and Oncology

## 2017-04-09 DIAGNOSIS — C7800 Secondary malignant neoplasm of unspecified lung: Principal | ICD-10-CM

## 2017-04-09 DIAGNOSIS — C50911 Malignant neoplasm of unspecified site of right female breast: Secondary | ICD-10-CM

## 2017-04-09 NOTE — Progress Notes (Signed)
Received call from [place], [facility] RN requesting for picc line dressing chage kits and port flushes to be sent to their office for pt. Faxed DME supply script to [facility] with detailed instructions. Notified Marlowe Kays and is aware.

## 2017-04-09 NOTE — Telephone Encounter (Signed)
Called pt x2 and unable to reach. Infusion RN called pt and lvm with call back number to check and see if pt was still coming for her scheduled chemo today. Pt is getting 3 hr taxol today.

## 2017-04-11 ENCOUNTER — Other Ambulatory Visit: Payer: Self-pay | Admitting: Adult Health

## 2017-04-11 DIAGNOSIS — C7800 Secondary malignant neoplasm of unspecified lung: Principal | ICD-10-CM

## 2017-04-11 DIAGNOSIS — K648 Other hemorrhoids: Secondary | ICD-10-CM

## 2017-04-11 DIAGNOSIS — C50911 Malignant neoplasm of unspecified site of right female breast: Secondary | ICD-10-CM

## 2017-04-12 ENCOUNTER — Telehealth: Payer: Self-pay

## 2017-04-12 ENCOUNTER — Other Ambulatory Visit: Payer: Self-pay | Admitting: Adult Health

## 2017-04-12 DIAGNOSIS — Z170 Estrogen receptor positive status [ER+]: Secondary | ICD-10-CM

## 2017-04-12 DIAGNOSIS — C50911 Malignant neoplasm of unspecified site of right female breast: Secondary | ICD-10-CM

## 2017-04-12 DIAGNOSIS — C7800 Secondary malignant neoplasm of unspecified lung: Principal | ICD-10-CM

## 2017-04-12 DIAGNOSIS — C50412 Malignant neoplasm of upper-outer quadrant of left female breast: Secondary | ICD-10-CM

## 2017-04-12 NOTE — Telephone Encounter (Signed)
Left VM to check up on Pt regarding her missed appointments on Friday the 16th.  Cyndia Bent RN

## 2017-04-13 ENCOUNTER — Telehealth: Payer: Self-pay

## 2017-04-13 NOTE — Telephone Encounter (Signed)
Left VM to check on pt.  Cyndia Bent RN

## 2017-04-16 ENCOUNTER — Encounter (HOSPITAL_COMMUNITY): Payer: Self-pay

## 2017-04-16 ENCOUNTER — Other Ambulatory Visit: Payer: Self-pay

## 2017-04-16 ENCOUNTER — Inpatient Hospital Stay (HOSPITAL_COMMUNITY)
Admission: EM | Admit: 2017-04-16 | Discharge: 2017-04-28 | DRG: 597 | Disposition: A | Discharge status: Skilled Nursing Facility | Payer: Medicaid Other | Attending: Nephrology | Admitting: Nephrology

## 2017-04-16 ENCOUNTER — Telehealth: Payer: Self-pay | Admitting: *Deleted

## 2017-04-16 DIAGNOSIS — C50412 Malignant neoplasm of upper-outer quadrant of left female breast: Principal | ICD-10-CM | POA: Diagnosis present

## 2017-04-16 DIAGNOSIS — C7800 Secondary malignant neoplasm of unspecified lung: Secondary | ICD-10-CM

## 2017-04-16 DIAGNOSIS — R402414 Glasgow coma scale score 13-15, 24 hours or more after hospital admission: Secondary | ICD-10-CM | POA: Diagnosis not present

## 2017-04-16 DIAGNOSIS — D500 Iron deficiency anemia secondary to blood loss (chronic): Secondary | ICD-10-CM | POA: Diagnosis present

## 2017-04-16 DIAGNOSIS — Z886 Allergy status to analgesic agent status: Secondary | ICD-10-CM

## 2017-04-16 DIAGNOSIS — R58 Hemorrhage, not elsewhere classified: Secondary | ICD-10-CM | POA: Diagnosis present

## 2017-04-16 DIAGNOSIS — D630 Anemia in neoplastic disease: Secondary | ICD-10-CM

## 2017-04-16 DIAGNOSIS — D649 Anemia, unspecified: Secondary | ICD-10-CM

## 2017-04-16 DIAGNOSIS — R188 Other ascites: Secondary | ICD-10-CM | POA: Diagnosis present

## 2017-04-16 DIAGNOSIS — C50919 Malignant neoplasm of unspecified site of unspecified female breast: Secondary | ICD-10-CM | POA: Diagnosis not present

## 2017-04-16 DIAGNOSIS — Z79899 Other long term (current) drug therapy: Secondary | ICD-10-CM

## 2017-04-16 DIAGNOSIS — C50911 Malignant neoplasm of unspecified site of right female breast: Secondary | ICD-10-CM

## 2017-04-16 DIAGNOSIS — F329 Major depressive disorder, single episode, unspecified: Secondary | ICD-10-CM | POA: Diagnosis present

## 2017-04-16 DIAGNOSIS — D638 Anemia in other chronic diseases classified elsewhere: Secondary | ICD-10-CM | POA: Diagnosis present

## 2017-04-16 DIAGNOSIS — C7801 Secondary malignant neoplasm of right lung: Secondary | ICD-10-CM | POA: Diagnosis present

## 2017-04-16 DIAGNOSIS — G893 Neoplasm related pain (acute) (chronic): Secondary | ICD-10-CM | POA: Diagnosis present

## 2017-04-16 DIAGNOSIS — D62 Acute posthemorrhagic anemia: Secondary | ICD-10-CM | POA: Diagnosis present

## 2017-04-16 DIAGNOSIS — D72829 Elevated white blood cell count, unspecified: Secondary | ICD-10-CM | POA: Diagnosis present

## 2017-04-16 DIAGNOSIS — E8809 Other disorders of plasma-protein metabolism, not elsewhere classified: Secondary | ICD-10-CM | POA: Diagnosis present

## 2017-04-16 DIAGNOSIS — S21002D Unspecified open wound of left breast, subsequent encounter: Secondary | ICD-10-CM

## 2017-04-16 DIAGNOSIS — C779 Secondary and unspecified malignant neoplasm of lymph node, unspecified: Secondary | ICD-10-CM | POA: Diagnosis present

## 2017-04-16 DIAGNOSIS — Z5329 Procedure and treatment not carried out because of patient's decision for other reasons: Secondary | ICD-10-CM | POA: Diagnosis not present

## 2017-04-16 DIAGNOSIS — Z6822 Body mass index (BMI) 22.0-22.9, adult: Secondary | ICD-10-CM

## 2017-04-16 DIAGNOSIS — R5381 Other malaise: Secondary | ICD-10-CM | POA: Diagnosis present

## 2017-04-16 DIAGNOSIS — S21009A Unspecified open wound of unspecified breast, initial encounter: Secondary | ICD-10-CM | POA: Diagnosis present

## 2017-04-16 DIAGNOSIS — E43 Unspecified severe protein-calorie malnutrition: Secondary | ICD-10-CM | POA: Diagnosis present

## 2017-04-16 DIAGNOSIS — Z66 Do not resuscitate: Secondary | ICD-10-CM | POA: Diagnosis not present

## 2017-04-16 DIAGNOSIS — Z7189 Other specified counseling: Secondary | ICD-10-CM

## 2017-04-16 DIAGNOSIS — C787 Secondary malignant neoplasm of liver and intrahepatic bile duct: Secondary | ICD-10-CM | POA: Diagnosis present

## 2017-04-16 DIAGNOSIS — Z008 Encounter for other general examination: Secondary | ICD-10-CM

## 2017-04-16 LAB — BASIC METABOLIC PANEL
Anion gap: 7 (ref 5–15)
BUN: 12 mg/dL (ref 6–20)
CALCIUM: 7.8 mg/dL — AB (ref 8.9–10.3)
CO2: 27 mmol/L (ref 22–32)
CREATININE: 0.69 mg/dL (ref 0.44–1.00)
Chloride: 102 mmol/L (ref 101–111)
GFR calc Af Amer: >60 mL/min (ref >60)
GLUCOSE: 113 mg/dL — AB (ref 65–99)
Potassium: 4.3 mmol/L (ref 3.5–5.1)
Sodium: 136 mmol/L (ref 135–145)

## 2017-04-16 LAB — CBC
HCT: 16.7 % — ABNORMAL LOW (ref 36.0–46.0)
Hemoglobin: 5.2 g/dL — CL (ref 12.0–15.0)
MCH: 25.2 pg — AB (ref 26.0–34.0)
MCHC: 31.1 g/dL (ref 30.0–36.0)
MCV: 81.1 fL (ref 78.0–100.0)
PLATELETS: 361 10*3/uL (ref 150–400)
RBC: 2.06 MIL/uL — ABNORMAL LOW (ref 3.87–5.11)
RDW: 20.4 % — AB (ref 11.5–15.5)
WBC: 16.1 10*3/uL — AB (ref 4.0–10.5)

## 2017-04-16 LAB — PREPARE RBC (CROSSMATCH)

## 2017-04-16 MED ORDER — ONDANSETRON HCL 4 MG PO TABS
4.0000 mg | ORAL_TABLET | Freq: Four times a day (QID) | ORAL | Status: DC | PRN
  Administered 2017-04-18 – 2017-04-28 (×3): 4 mg via ORAL
  Filled 2017-04-16 (×3): qty 1

## 2017-04-16 MED ORDER — FUROSEMIDE 10 MG/ML IJ SOLN
20.0000 mg | Freq: Once | INTRAMUSCULAR | Status: AC
  Administered 2017-04-16: 20 mg via INTRAVENOUS
  Filled 2017-04-16: qty 2

## 2017-04-16 MED ORDER — MORPHINE SULFATE (PF) 4 MG/ML IV SOLN
4.0000 mg | Freq: Once | INTRAVENOUS | Status: AC
  Administered 2017-04-16: 4 mg via INTRAVENOUS
  Filled 2017-04-16: qty 1

## 2017-04-16 MED ORDER — MIRTAZAPINE 15 MG PO TBDP
15.0000 mg | ORAL_TABLET | Freq: Every day | ORAL | Status: DC
  Administered 2017-04-16 – 2017-04-27 (×12): 15 mg via ORAL
  Filled 2017-04-16 (×12): qty 1

## 2017-04-16 MED ORDER — SODIUM CHLORIDE 0.9% FLUSH
10.0000 mL | INTRAVENOUS | Status: DC | PRN
  Administered 2017-04-20: 10 mL
  Filled 2017-04-16: qty 40

## 2017-04-16 MED ORDER — ALTEPLASE 2 MG IJ SOLR
2.0000 mg | Freq: Once | INTRAMUSCULAR | Status: AC
  Administered 2017-04-16: 2 mg
  Filled 2017-04-16: qty 2

## 2017-04-16 MED ORDER — LIDOCAINE-EPINEPHRINE (PF) 2 %-1:200000 IJ SOLN
INTRAMUSCULAR | Status: AC
  Filled 2017-04-16: qty 40

## 2017-04-16 MED ORDER — ONDANSETRON HCL 4 MG/2ML IJ SOLN
4.0000 mg | Freq: Four times a day (QID) | INTRAMUSCULAR | Status: DC | PRN
  Administered 2017-04-17 (×2): 4 mg via INTRAVENOUS
  Filled 2017-04-16 (×3): qty 2

## 2017-04-16 MED ORDER — GABAPENTIN 300 MG PO CAPS
600.0000 mg | ORAL_CAPSULE | Freq: Three times a day (TID) | ORAL | Status: DC
  Administered 2017-04-16 – 2017-04-28 (×35): 600 mg via ORAL
  Filled 2017-04-16 (×5): qty 2
  Filled 2017-04-16: qty 6
  Filled 2017-04-16 (×31): qty 2

## 2017-04-16 MED ORDER — ENSURE ENLIVE PO LIQD
237.0000 mL | Freq: Three times a day (TID) | ORAL | Status: DC
  Administered 2017-04-17 – 2017-04-28 (×28): 237 mL via ORAL

## 2017-04-16 MED ORDER — OXYCODONE-ACETAMINOPHEN 5-325 MG PO TABS
2.0000 | ORAL_TABLET | ORAL | Status: DC | PRN
  Administered 2017-04-16 – 2017-04-20 (×18): 2 via ORAL
  Filled 2017-04-16 (×19): qty 2

## 2017-04-16 MED ORDER — SODIUM CHLORIDE 0.9 % IV SOLN
Freq: Once | INTRAVENOUS | Status: AC
  Administered 2017-04-16: 23:00:00 via INTRAVENOUS

## 2017-04-16 MED ORDER — ENOXAPARIN SODIUM 40 MG/0.4ML ~~LOC~~ SOLN
40.0000 mg | Freq: Every day | SUBCUTANEOUS | Status: DC
  Filled 2017-04-16: qty 0.4

## 2017-04-16 NOTE — H&P (Addendum)
TRH H&P    Patient Demographics:    Ann Oliver, is a 61 y.o. female  MRN: [id number]  DOB - [birth year]  Admit Date - 04/16/2017  Referring MD/NP/PA: Marye Round  Outpatient Primary MD for the patient is [facility]  Patient coming from: Home  Chief Complaint  Patient presents with  . Wound Check      HPI:    Ann Oliver  is a 61 y.o. female,.. 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  Patient was seen by her home health nurse who instructed her to come to ED for further evaluation.  Patient denies fevers or chills. Denies nausea vomiting or diarrhea. Denies chest pain shortness of breath. Denies history of seizures stroke Patient also has right PICC line in place.  In the ED, ED physician applied pressure dressing to the chest wall mass and bleeding stopped. Lab work showed hemoglobin of 5.2, down from previous hemoglobin of 8.2 from 04/02/2017. Drop in hemoglobin presumed from persistent bleeding from fungating breast mass.  2 units PRBC ordered in the ED.   Review of systems:      All other systems reviewed and are negative.   With Past History of the following :    Past Medical History:  Diagnosis Date  . Cancer ([place])   . Sinusitis   . Tumor cells       Past Surgical History:  Procedure Laterality Date  . BIOPSY BREAST    . IR FLUORO GUIDE CV LINE RIGHT  11/06/2016  . IR US GUIDE VASC ACCESS RIGHT  11/06/2016      Social History:      Social History   Tobacco Use  . Smoking status: Never Smoker  . Smokeless tobacco: Never Used  Substance Use Topics  . Alcohol use: No       Family History :     Family History  Problem Relation Age of Onset  . Breast cancer  Neg Hx       Home Medications:   Prior to Admission medications   Medication Sig Start Date End Date Taking? Authorizing Provider  gabapentin (NEURONTIN) 600 MG tablet Take 1 tablet (600 mg total) 3 (three) times daily by mouth. 04/05/17  Yes Nicholas Lose, MD  ondansetron (ZOFRAN) 8 MG tablet take 1 tablet by mouth every 8 hours if needed for nausea and vomiting 04/13/17  Yes Causey, Charlestine Massed, NP  oxyCODONE-acetaminophen (PERCOCET/ROXICET) 5-325 MG tablet Take 2 tablets by mouth every 4 (four) hours as needed for severe pain. 03/24/17  Yes Bonnielee Haff, MD  ANUCORT-HC 25 MG suppository unwrap and insert 1 suppository rectally twice a day if needed for HEMORRHOIDS OR ANAL ITCHING 04/12/17   Causey, Charlestine Massed, NP  feeding supplement, ENSURE ENLIVE, (ENSURE ENLIVE) LIQD Take 237 mLs by mouth 3 (three) times daily between meals. Patient not taking: Reported on 04/16/2017 09/26/16   [name], MD  loperamide (IMODIUM) 2 MG capsule Take 1 capsule (2 mg total) as needed by mouth for diarrhea or loose stools. Take as directed on package 03/30/17   Domenic Polite, MD  mirtazapine (REMERON SOL-TAB) 15 MG disintegrating tablet Take 1 tablet (15 mg total) by mouth at bedtime. Patient not taking: Reported on 04/16/2017 03/24/17   Bonnielee Haff, MD     Allergies:     Allergies  Allergen Reactions  . Aspirin Palpitations     Physical Exam:   Vitals  Blood pressure (!) 115/59, pulse 86, temperature 98.1 F (36.7 C), temperature source Oral, resp. rate 18, height 5\' 2"  (1.575 m), weight 53.1 kg (117 lb), SpO2 100 %.  1.  General: Appears in no acute distress  2. Psychiatric:  Intact judgement and  insight, awake alert, oriented x 3.  3. Neurologic: No focal neurological deficits, all cranial nerves intact.Strength 5/5 all 4 extremities, sensation intact all 4 extremities, plantars down going.  4. Eyes :  anicteric sclerae, moist conjunctivae with no lid lag.  PERRLA.  5. ENMT:  Oropharynx clear with moist mucous membranes and good dentition  6. Neck:  supple, no cervical lymphadenopathy appriciated, No thyromegaly  7. Respiratory : Normal respiratory effort, good air movement bilaterally,clear to  auscultation bilaterally  8. Cardiovascular : RRR, no gallops, rubs or murmurs, no leg edema  9. Gastrointestinal:  Positive bowel sounds, abdomen soft, distended, dullness to percussion noted on  lateral sides of abdomen     10. Skin:  Dressing noted on the anterior chest wall  11.Musculoskeletal:  Good muscle tone,  joints appear normal , no effusions,  normal range of motion    Data Review:    CBC Recent Labs  Lab 04/16/17 1840  WBC 16.1*  HGB 5.2*  HCT 16.7*  PLT 361  MCV 81.1  MCH 25.2*  MCHC 31.1  RDW 20.4*   ------------------------------------------------------------------------------------------------------------------  Chemistries  Recent Labs  Lab 04/16/17 1840  NA 136  K 4.3  CL 102  CO2 27  GLUCOSE 113*  BUN 12  CREATININE 0.69  CALCIUM 7.8*   ------------------------------------------------------------------------------------------------------------------  ------------------------------------------------------------------------------------------------------------------  --------------------------------------------------------------------------------------------------------------- Urine analysis:    Component Value Date/Time   COLORURINE YELLOW 03/27/2017 1459   APPEARANCEUR CLEAR 03/27/2017 1459   LABSPEC 1.012 03/27/2017 1459   PHURINE 5.0 03/27/2017 1459   GLUCOSEU NEGATIVE 03/27/2017 1459   HGBUR NEGATIVE 03/27/2017 1459   BILIRUBINUR NEGATIVE 03/27/2017 1459   KETONESUR 5 (A) 03/27/2017 1459   PROTEINUR NEGATIVE 03/27/2017 1459   NITRITE NEGATIVE 03/27/2017 1459   LEUKOCYTESUR TRACE (A) 03/27/2017 1459      Imaging Results:      Assessment & Plan:    Active Problems:    Anemia   1. Anemia-secondary to acute blood loss from fungating breast mass, will transfuse 2 units PRBC.  Follow CBC in a.m. we will give Lasix 20 mg IV x1 after first unit PRBC 2. Bleeding from fungating breast mass-this has been pathology chronic issue,  she was earlier admitted on 11-18 with same complaint.  Patient is on palliative chemotherapy.  Bleeding has now stopped after pressure dressing was applied by ED physician.  3. Ascites/anasarca-patient's albumin was 2.2 on 11-18, she has bilateral lower extremity edema as well as abdominal distention most likely from ascites.  Concern  for malignant ascites.  Will order ultrasound-guided paracentesis in a.m. we will also check cytology from ascitic fluid for malignancy 4. Hyperalbuminemia/malnutrition-consult nutrition in a.m.     DVT Prophylaxis-   Lovenox  AM Labs Ordered, also please review Full Orders  Family Communication: Admission, patients condition and plan of care including tests being ordered have been discussed with the patient * who indicate understanding and agree with the plan and Code Status.  Code Status: Full code  Admission status: Observation  Time spent in minutes : 60 minutes   Oswald Hillock M.D on 04/16/2017 at 8:30 PM  Between 7am to 7pm - Pager - [phone]. After 7pm go to [email or web address] - password [email or web address]  [facility] - Office  [phone]

## 2017-04-16 NOTE — ED Notes (Signed)
Bed: OR98 Expected date:  Expected time:  Means of arrival:  Comments: 61 yo recent wound debridement- bleeding- CA pt

## 2017-04-16 NOTE — Telephone Encounter (Signed)
VM retrieved forwarded from Triage - message was left at 447 pm on 04/14/2017.  Message was from [place] nurse stating she is presently at home of  [facility]- pt is asking for refill on pain medication and anti diarrhea ( imodium).  Marlowe Kays also stated per her discussion with Carolynn Sayers at [facility] who provides saline and heparin for PICC line care - they are unable to bill for supplies. Pam is informing Connie flushes and PICC line care should be done at the [facility].  Marlowe Kays gave number for contact with Pam as [phone].  Return call number for Marlowe Kays given as [phone].  This RN attempted to contact Pam per above to discuss concerns - informed Pam off today.  This RN then contacted Marlowe Kays - and discussed above- including inquiry if PICC line supplies obtained via outpt pharmacy and patient kept them at home - could they provide the nursing care. Marlowe Kays stated that could be an alternative.  Discussed issue with [place] is continuity of coming into the office due to issues with transportation - there fore being able to provide home care can benefit pt.  Marlowe Kays verified above including that presently pt is " out of money and she usually gets a cab to bring her to her appointments "  Marlowe Kays did state concern regarding wound care " I am very concerned due to the bleeding of the wound when the dressing is removed " - I am afraid that even though we are trying to hold pressure the bleeding can be massive "  Above would care discussed related to pt's known diagnosis.  Plan at present is to follow up with PICC line issues - dressing concerns will be reviewed with MD.

## 2017-04-16 NOTE — ED Notes (Signed)
Date and time results received: 04/16/17 1900 (use smartphrase ".now" to insert current time)  Test: Hgb Critical Value: 5.2  Name of Provider Notified: Tomi Bamberger  Orders Received? Or Actions Taken?: Actions Taken: Chiropodist and provider

## 2017-04-16 NOTE — Telephone Encounter (Signed)
Patient called in reference to PICC line care.  reports "It has a date of 03-29-2017.  It loks good nothing's wrong with it.  No, I can't come in today.  How about Monday?  Can I see the doctor also on Monday?   I need a prescription for the bowel medication.  With a prescription it cost three dollars through [facility]."  Provider out of office.  Aware [facility] will call with provider response and orders.

## 2017-04-16 NOTE — ED Provider Notes (Signed)
[facility] DEPT Provider Note   CSN: [id number] Arrival date & time: 04/16/17  1717     History   Chief Complaint Chief Complaint  Patient presents with  . Wound Check    HPI Ann Oliver is a 61 y.o. female.  HPI Patient presents to the emergency room for evaluation of bleeding from a breast wound.  Pt has metastatic breast CA.   Pt has a known fungating breast mass.  She has been receiving palliative chemo.  Pt states her breast wound was bleeding again today.  Her home health nurse instructed her to come to the ED.  Pt denies fevers, or chills.  No weakness.  No vomiting or diarrhea.  Pt also requests the dressing on her picc line in the right arm to be replaced. Past Medical History:  Diagnosis Date  . Cancer ([place])   . Sinusitis   . Tumor cells     Patient Active Problem List   Diagnosis Date Noted  . AKI (acute kidney injury) ([place]) 03/26/2017  . Leg swelling 03/26/2017  . Diarrhea 03/26/2017  . Leukocytosis 03/26/2017  . Anemia 03/26/2017  . Acute bleeding 03/18/2017  . Breast wound 03/18/2017  . Blood loss anemia 01/18/2017  . Port catheter in place 12/02/2016  . Necrotizing soft tissue infection 11/21/2016  . Cellulitis 10/28/2016  . Wound infection 10/28/2016  . Cellulitis of left breast 10/28/2016  . Encounter for palliative care   . Goals of care, counseling/discussion   . 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 Malnutrition of moderate degree 09/25/2016  . Breast cancer of upper-outer quadrant of left female breast ([place]) 10/22/2015  . THYROID NODULE, RIGHT 05/31/2007  . Depressed 05/31/2007  . GERD 05/31/2007  . HOT FLASHES 05/31/2007  . HEADACHE 05/31/2007    Past Surgical History:  Procedure Laterality Date  . BIOPSY BREAST    . IR FLUORO GUIDE CV LINE RIGHT  11/06/2016  . IR US GUIDE VASC ACCESS RIGHT  11/06/2016    OB History    No data available       Home Medications    Prior  to Admission medications   Medication Sig Start Date End Date Taking? Authorizing Provider  gabapentin (NEURONTIN) 600 MG tablet Take 1 tablet (600 mg total) 3 (three) times daily by mouth. 04/05/17  Yes Nicholas Lose, MD  ondansetron (ZOFRAN) 8 MG tablet take 1 tablet by mouth every 8 hours if needed for nausea and vomiting 04/13/17  Yes Causey, Charlestine Massed, NP  oxyCODONE-acetaminophen (PERCOCET/ROXICET) 5-325 MG tablet Take 2 tablets by mouth every 4 (four) hours as needed for severe pain. 03/24/17  Yes Bonnielee Haff, MD  ANUCORT-HC 25 MG suppository unwrap and insert 1 suppository rectally twice a day if needed for HEMORRHOIDS OR ANAL ITCHING 04/12/17   Causey, Charlestine Massed, NP  feeding supplement, ENSURE ENLIVE, (ENSURE ENLIVE) LIQD Take 237 mLs by mouth 3 (three) times daily between meals. Patient not taking: Reported on 04/16/2017 09/26/16   [name], MD  loperamide (IMODIUM) 2 MG capsule Take 1 capsule (2 mg total) as needed by mouth for diarrhea or loose stools. Take as directed on package 03/30/17   Domenic Polite, MD  mirtazapine (REMERON SOL-TAB) 15 MG disintegrating tablet Take 1 tablet (15 mg total) by mouth at bedtime. Patient not taking: Reported on 04/16/2017 03/24/17   Bonnielee Haff, MD    Family History Family History  Problem Relation Age of Onset  . Breast cancer Neg  Hx     Social History Social History   Tobacco Use  . Smoking status: Never Smoker  . Smokeless tobacco: Never Used  Substance Use Topics  . Alcohol use: No  . Drug use: No     Allergies   Aspirin   Review of Systems Review of Systems  All other systems reviewed and are negative.    Physical Exam Updated Vital Signs BP 119/67 (BP Location: Left Arm)   Pulse (!) 103   Temp 98.4 F (36.9 C) (Oral)   Ht 1.575 m (5\' 2" )   Wt 53.1 kg (117 lb)   SpO2 100%   BMI 21.40 kg/m   Physical Exam  Constitutional: She appears well-developed and well-nourished. No distress.    HENT:  Head: Normocephalic and atraumatic.  Right Ear: External ear normal.  Left Ear: External ear normal.  Eyes: Conjunctivae are normal. Right eye exhibits no discharge. Left eye exhibits no discharge. No scleral icterus.  Neck: Neck supple. No tracheal deviation present.  Cardiovascular: Normal rate, regular rhythm and intact distal pulses.  Pulmonary/Chest: Effort normal and breath sounds normal. No stridor. No respiratory distress. She has no wheezes. She has no rales. She exhibits mass.  Large fungating mass that appears to be approximately 6 x 8 inches on the chest wall on the left side, several areas of oozing blood no pulsatile blood flow, there was one area at the superior aspect of the mass that seemed to be bleeding more briskly  Abdominal: Soft. Bowel sounds are normal. She exhibits no distension. There is no tenderness. There is no rebound and no guarding.  Musculoskeletal: She exhibits no edema or tenderness.  picc line right ac, no erythema or drainage, dressing appears very old and falling off  Neurological: She is alert. She has normal strength. No cranial nerve deficit (no facial droop, extraocular movements intact, no slurred speech) or sensory deficit. She exhibits normal muscle tone. She displays no seizure activity. Coordination normal.  Skin: Skin is warm and dry. No rash noted.  Psychiatric: She has a normal mood and affect.  Nursing note and vitals reviewed.    ED Treatments / Results  Labs (all labs ordered are listed, but only abnormal results are displayed) Labs Reviewed  CBC - Abnormal; Notable for the following components:      Result Value   WBC 16.1 (*)    RBC 2.06 (*)    Hemoglobin 5.2 (*)    HCT 16.7 (*)    MCH 25.2 (*)    RDW 20.4 (*)    All other components within normal limits  BASIC METABOLIC PANEL - Abnormal; Notable for the following components:   Glucose, Bld 113 (*)    Calcium 7.8 (*)    All other components within normal limits  TYPE  AND SCREEN  PREPARE RBC (CROSSMATCH)    EKG  EKG Interpretation None       Radiology No results found.  Procedures Wound packing Date/Time: 04/16/2017 6:37 PM Performed by: Dorie Rank, MD Authorized by: Dorie Rank, MD  Comments: Patient's wound was covered with quick clot dressings.  The area more active bleeding was packed and I applied direct pressure for several minutes.  A pressure dressing was then applied around her chest cavity using Coban and Ace wraps.  Hemostasis was achieved    (including critical care time)  Medications Ordered in ED Medications  morphine 4 MG/ML injection 4 mg (not administered)  0.9 %  sodium chloride infusion (not administered)  Initial Impression / Assessment and Plan / ED Course  I have reviewed the triage vital signs and the nursing notes.  Pertinent labs & imaging results that were available during my care of the patient were reviewed by me and considered in my medical decision making (see chart for details).  Clinical Course as of Apr 16 1920  Ludwig Clarks Apr 16, 2017  1912 Hgb is down to 5.2.  Will order blood transfusions.  [JK]    Clinical Course User Index [JK] Dorie Rank, MD   Patient presented to the emergency room for evaluation of persistent bleeding of a very large fungating mass on her breast.  According to the notes the patient is getting palliative chemo to see if they can help reduce the size of the tumor.  Patient continues to bleed from the wound on her breast mass.  Today she did have some active bleeding from the wound site.  I was able to get the bleeding to stop with direct pressure and dressings.  I have ordered blood transfusions.  I will consult the medical service for admission and further treatment.  Final Clinical Impressions(s) / ED Diagnoses   Final diagnoses:  Metastatic breast cancer ([place])  Symptomatic anemia      Dorie Rank, MD 04/16/17 [place]

## 2017-04-16 NOTE — ED Triage Notes (Signed)
EMS reports Pt had recent debridement of wound to left side/ribcage, home health called EMS for hypotension and bleeding from wound site.  BP 156/98 HR 100 Resp 20 sp02 98 RA

## 2017-04-17 ENCOUNTER — Observation Stay (HOSPITAL_COMMUNITY): Payer: Medicaid Other

## 2017-04-17 DIAGNOSIS — D72829 Elevated white blood cell count, unspecified: Secondary | ICD-10-CM | POA: Diagnosis present

## 2017-04-17 DIAGNOSIS — R402414 Glasgow coma scale score 13-15, 24 hours or more after hospital admission: Secondary | ICD-10-CM | POA: Diagnosis not present

## 2017-04-17 DIAGNOSIS — R4189 Other symptoms and signs involving cognitive functions and awareness: Secondary | ICD-10-CM | POA: Diagnosis not present

## 2017-04-17 DIAGNOSIS — R42 Dizziness and giddiness: Secondary | ICD-10-CM | POA: Diagnosis not present

## 2017-04-17 DIAGNOSIS — Z66 Do not resuscitate: Secondary | ICD-10-CM | POA: Diagnosis not present

## 2017-04-17 DIAGNOSIS — D638 Anemia in other chronic diseases classified elsewhere: Secondary | ICD-10-CM | POA: Diagnosis present

## 2017-04-17 DIAGNOSIS — D500 Iron deficiency anemia secondary to blood loss (chronic): Secondary | ICD-10-CM | POA: Diagnosis not present

## 2017-04-17 DIAGNOSIS — R180 Malignant ascites: Secondary | ICD-10-CM | POA: Diagnosis not present

## 2017-04-17 DIAGNOSIS — C787 Secondary malignant neoplasm of liver and intrahepatic bile duct: Secondary | ICD-10-CM | POA: Diagnosis present

## 2017-04-17 DIAGNOSIS — R5381 Other malaise: Secondary | ICD-10-CM | POA: Diagnosis present

## 2017-04-17 DIAGNOSIS — R188 Other ascites: Secondary | ICD-10-CM

## 2017-04-17 DIAGNOSIS — E43 Unspecified severe protein-calorie malnutrition: Secondary | ICD-10-CM | POA: Diagnosis present

## 2017-04-17 DIAGNOSIS — Z515 Encounter for palliative care: Secondary | ICD-10-CM | POA: Diagnosis not present

## 2017-04-17 DIAGNOSIS — E8809 Other disorders of plasma-protein metabolism, not elsewhere classified: Secondary | ICD-10-CM | POA: Diagnosis present

## 2017-04-17 DIAGNOSIS — C50919 Malignant neoplasm of unspecified site of unspecified female breast: Secondary | ICD-10-CM | POA: Diagnosis not present

## 2017-04-17 DIAGNOSIS — D62 Acute posthemorrhagic anemia: Secondary | ICD-10-CM | POA: Diagnosis present

## 2017-04-17 DIAGNOSIS — Z886 Allergy status to analgesic agent status: Secondary | ICD-10-CM | POA: Diagnosis not present

## 2017-04-17 DIAGNOSIS — C7801 Secondary malignant neoplasm of right lung: Secondary | ICD-10-CM | POA: Diagnosis present

## 2017-04-17 DIAGNOSIS — D508 Other iron deficiency anemias: Secondary | ICD-10-CM | POA: Diagnosis not present

## 2017-04-17 DIAGNOSIS — Z79899 Other long term (current) drug therapy: Secondary | ICD-10-CM | POA: Diagnosis not present

## 2017-04-17 DIAGNOSIS — C50412 Malignant neoplasm of upper-outer quadrant of left female breast: Secondary | ICD-10-CM | POA: Diagnosis not present

## 2017-04-17 DIAGNOSIS — S21002D Unspecified open wound of left breast, subsequent encounter: Secondary | ICD-10-CM | POA: Diagnosis not present

## 2017-04-17 DIAGNOSIS — C50911 Malignant neoplasm of unspecified site of right female breast: Secondary | ICD-10-CM | POA: Diagnosis not present

## 2017-04-17 DIAGNOSIS — Z008 Encounter for other general examination: Secondary | ICD-10-CM | POA: Diagnosis not present

## 2017-04-17 DIAGNOSIS — G893 Neoplasm related pain (acute) (chronic): Secondary | ICD-10-CM | POA: Diagnosis present

## 2017-04-17 DIAGNOSIS — S21001S Unspecified open wound of right breast, sequela: Secondary | ICD-10-CM | POA: Diagnosis not present

## 2017-04-17 DIAGNOSIS — F329 Major depressive disorder, single episode, unspecified: Secondary | ICD-10-CM | POA: Diagnosis present

## 2017-04-17 DIAGNOSIS — R58 Hemorrhage, not elsewhere classified: Secondary | ICD-10-CM | POA: Diagnosis not present

## 2017-04-17 DIAGNOSIS — Z7189 Other specified counseling: Secondary | ICD-10-CM | POA: Diagnosis not present

## 2017-04-17 DIAGNOSIS — Z5329 Procedure and treatment not carried out because of patient's decision for other reasons: Secondary | ICD-10-CM | POA: Diagnosis not present

## 2017-04-17 DIAGNOSIS — D649 Anemia, unspecified: Secondary | ICD-10-CM | POA: Diagnosis not present

## 2017-04-17 DIAGNOSIS — C7800 Secondary malignant neoplasm of unspecified lung: Secondary | ICD-10-CM | POA: Diagnosis not present

## 2017-04-17 DIAGNOSIS — C779 Secondary and unspecified malignant neoplasm of lymph node, unspecified: Secondary | ICD-10-CM | POA: Diagnosis present

## 2017-04-17 DIAGNOSIS — C773 Secondary and unspecified malignant neoplasm of axilla and upper limb lymph nodes: Secondary | ICD-10-CM | POA: Diagnosis not present

## 2017-04-17 DIAGNOSIS — Z6822 Body mass index (BMI) 22.0-22.9, adult: Secondary | ICD-10-CM | POA: Diagnosis not present

## 2017-04-17 LAB — COMPREHENSIVE METABOLIC PANEL WITH GFR
ALT: 22 U/L (ref 14–54)
AST: 30 U/L (ref 15–41)
Albumin: 1.6 g/dL — ABNORMAL LOW (ref 3.5–5.0)
Alkaline Phosphatase: 114 U/L (ref 38–126)
Anion gap: 5 (ref 5–15)
BUN: 10 mg/dL (ref 6–20)
CO2: 28 mmol/L (ref 22–32)
Calcium: 7.7 mg/dL — ABNORMAL LOW (ref 8.9–10.3)
Chloride: 104 mmol/L (ref 101–111)
Creatinine, Ser: 0.71 mg/dL (ref 0.44–1.00)
GFR calc Af Amer: >60 mL/min
GFR calc non Af Amer: >60 mL/min
Glucose, Bld: 77 mg/dL (ref 65–99)
Potassium: 4.2 mmol/L (ref 3.5–5.1)
Sodium: 137 mmol/L (ref 135–145)
Total Bilirubin: 0.6 mg/dL (ref 0.3–1.2)
Total Protein: 4.6 g/dL — ABNORMAL LOW (ref 6.5–8.1)

## 2017-04-17 LAB — CBC
HCT: 23.1 % — ABNORMAL LOW (ref 36.0–46.0)
Hemoglobin: 7.6 g/dL — ABNORMAL LOW (ref 12.0–15.0)
MCH: 27.5 pg (ref 26.0–34.0)
MCHC: 32.9 g/dL (ref 30.0–36.0)
MCV: 83.7 fL (ref 78.0–100.0)
Platelets: 299 K/uL (ref 150–400)
RBC: 2.76 MIL/uL — ABNORMAL LOW (ref 3.87–5.11)
RDW: 17.4 % — ABNORMAL HIGH (ref 11.5–15.5)
WBC: 10.9 K/uL — ABNORMAL HIGH (ref 4.0–10.5)

## 2017-04-17 LAB — PREPARE RBC (CROSSMATCH)

## 2017-04-17 MED ORDER — SODIUM CHLORIDE 0.9 % IV SOLN
Freq: Once | INTRAVENOUS | Status: AC
  Administered 2017-04-17: 16:00:00 via INTRAVENOUS

## 2017-04-17 MED ORDER — LIDOCAINE HCL 1 % IJ SOLN
INTRAMUSCULAR | Status: AC
  Filled 2017-04-17: qty 20

## 2017-04-17 NOTE — Progress Notes (Signed)
PROGRESS NOTE  Ann Oliver EAV:[id number] DOB: Oct 11, 1955 DOA: 04/16/2017 PCP: [facility]   LOS: 0 days   Brief Narrative / Interim history: Ann Oliver  is a 61 y.o. female,.. 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  Patient was seen by her home health nurse who instructed her to come to ED for further evaluation.  Patient denies fevers or chills. She was found to have a hemoglobin of 5.2  Assessment & Plan: Active Problems:   Breast cancer of upper-outer quadrant of left female breast ([place])   Breast cancer metastasized to lung, right ([facility])   Blood loss anemia   Acute bleeding   Breast wound   Leukocytosis    Acute blood loss anemia -secondary to acute blood loss from fungating breast mass -transfused 2U pRBC, Hb 7.6, transfuse one additional unit -monitor today, probable d/c tomorrow if no further bleeding -surgery saw patient in the past, potentially she could benefit from mass removal once chemo treatment is finished. S  Bleeding from fungating breast mass -this has been pathology chronic issue,  she was earlier admitted on 11-18 with same complaint.  Patient is on palliative chemotherapy.  Bleeding has now stopped after pressure dressing was applied by ED physician.   Ascites/anasarca -patient's albumin was 2.2 on 11-18, she has bilateral lower extremity edema as well as abdominal distention most likely from ascites.  Concern  for malignant ascites. -US paracentesis pending   Hyperalbuminemia/malnutrition  Leukocytosis -chronic, improving. No fevers, no suspicion for infection   DVT prophylaxis: SCD Code Status: Full code Family Communication: no family at bedside Disposition Plan: home 1 day  Consultants:   None   Procedures:   None    Antimicrobials:  None    Subjective: - no chest pain, shortness of breath, no abdominal pain, nausea or vomiting.   Objective: Vitals:   04/17/17 0111 04/17/17 0126 04/17/17 0352 04/17/17 0505  BP: (!) 115/54 112/63 108/70 108/72  Pulse: 88 87 86 80  Resp: 16 18 18 16   Temp: 98.6 F (37 C) 98.1 F (36.7 C) 97.9 F (36.6 C) 98 F (36.7 C)  TempSrc: Oral Oral Oral Oral  SpO2: 100% 100% 100% 100%  Weight:      Height:        Intake/Output Summary (Last 24 hours) at 04/17/2017 1337 Last data filed at 04/17/2017 0352 Gross per 24 hour  Intake 930 ml  Output -  Net 930 ml   Filed Weights   04/16/17 1726 04/16/17 2154  Weight: 53.1 kg (117 lb) 56.1 kg (123 lb 10.9 oz)    Examination:  Constitutional: NAD Eyes: lids and conjunctivae normal ENMT: Mucous membranes are mois Respiratory: clear to auscultation bilaterally, no wheezing, no crackles. Normal respiratory effort. No accessory muscle use.  Cardiovascular: Regular rate and rhythm, no murmurs / rubs / gallops. No LE edema. Abdomen: no tenderness. Bowel sounds positive.  Neurologic: non focal    Data Reviewed: I have independently reviewed following labs and imaging studies   CBC: Recent Labs  Lab 04/16/17 1840 04/17/17 0500  WBC 16.1* 10.9*  HGB 5.2* 7.6*  HCT 16.7* 23.1*  MCV 81.1 83.7  PLT 361 782   Basic Metabolic Panel: Recent Labs  Lab 04/16/17 1840 04/17/17 0500  NA 136 137  K 4.3 4.2  CL 102 104  CO2 27 28  GLUCOSE 113*  77  BUN 12 10  CREATININE 0.69 0.71  CALCIUM 7.8* 7.7*   GFR: Estimated Creatinine Clearance: 57.7 mL/min (by C-G formula based on SCr of 0.71 mg/dL). Liver Function Tests: Recent Labs  Lab 04/17/17 0500  AST 30  ALT 22  ALKPHOS 114  BILITOT 0.6  PROT 4.6*  ALBUMIN 1.6*   No results for input(s): LIPASE, AMYLASE in the last 168 hours. No results for input(s): AMMONIA in the last 168 hours. Coagulation Profile: No results for input(s): INR, PROTIME in the  last 168 hours. Cardiac Enzymes: No results for input(s): CKTOTAL, CKMB, CKMBINDEX, TROPONINI in the last 168 hours. BNP (last 3 results) No results for input(s): PROBNP in the last 8760 hours. HbA1C: No results for input(s): HGBA1C in the last 72 hours. CBG: No results for input(s): GLUCAP in the last 168 hours. Lipid Profile: No results for input(s): CHOL, HDL, LDLCALC, TRIG, CHOLHDL, LDLDIRECT in the last 72 hours. Thyroid Function Tests: No results for input(s): TSH, T4TOTAL, FREET4, T3FREE, THYROIDAB in the last 72 hours. Anemia Panel: No results for input(s): VITAMINB12, FOLATE, FERRITIN, TIBC, IRON, RETICCTPCT in the last 72 hours. Urine analysis:    Component Value Date/Time   COLORURINE YELLOW 03/27/2017 1459   APPEARANCEUR CLEAR 03/27/2017 1459   LABSPEC 1.012 03/27/2017 1459   PHURINE 5.0 03/27/2017 1459   GLUCOSEU NEGATIVE 03/27/2017 1459   HGBUR NEGATIVE 03/27/2017 1459   BILIRUBINUR NEGATIVE 03/27/2017 1459   KETONESUR 5 (A) 03/27/2017 1459   PROTEINUR NEGATIVE 03/27/2017 1459   NITRITE NEGATIVE 03/27/2017 1459   LEUKOCYTESUR TRACE (A) 03/27/2017 1459   Sepsis Labs: Invalid input(s): PROCALCITONIN, LACTICIDVEN  No results found for this or any previous visit (from the past 240 hour(s)).    Radiology Studies: No results found.   Scheduled Meds: . enoxaparin (LOVENOX) injection  40 mg Subcutaneous QHS  . feeding supplement (ENSURE ENLIVE)  237 mL Oral TID BM  . gabapentin  600 mg Oral TID  . mirtazapine  15 mg Oral QHS   Continuous Infusions: . sodium chloride       Marzetta Board, MD, PhD [facility] Pager [phone]  If 7PM-7AM, please contact night-coverage [email or web address] Password [email or web address] 04/17/2017, 1:37 PM

## 2017-04-18 DIAGNOSIS — C7800 Secondary malignant neoplasm of unspecified lung: Secondary | ICD-10-CM

## 2017-04-18 DIAGNOSIS — C50911 Malignant neoplasm of unspecified site of right female breast: Secondary | ICD-10-CM

## 2017-04-18 LAB — CBC
HEMATOCRIT: 26.1 % — AB (ref 36.0–46.0)
HEMOGLOBIN: 8.7 g/dL — AB (ref 12.0–15.0)
MCH: 28 pg (ref 26.0–34.0)
MCHC: 33.3 g/dL (ref 30.0–36.0)
MCV: 83.9 fL (ref 78.0–100.0)
Platelets: 288 10*3/uL (ref 150–400)
RBC: 3.11 MIL/uL — AB (ref 3.87–5.11)
RDW: 17.3 % — ABNORMAL HIGH (ref 11.5–15.5)
WBC: 12 10*3/uL — ABNORMAL HIGH (ref 4.0–10.5)

## 2017-04-18 MED ORDER — ADULT MULTIVITAMIN W/MINERALS CH
1.0000 | ORAL_TABLET | Freq: Every day | ORAL | Status: DC
  Administered 2017-04-18 – 2017-04-28 (×11): 1 via ORAL
  Filled 2017-04-18 (×11): qty 1

## 2017-04-18 NOTE — Progress Notes (Signed)
PROGRESS NOTE  Ann Oliver UXL:[id number] DOB: 25-Feb-1956 DOA: 04/16/2017 PCP: Center, [facility]   LOS: 1 day   Brief Narrative / Interim history: Ann Oliver  is a 61 y.o. female,.. 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  Patient was seen by her home health nurse who instructed her to come to ED for further evaluation.  Patient denies fevers or chills. 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   Bleeding from fungating breast mass -this has been main chronic issue,  she was earlier admitted on 11-18 with same complaint.  Patient is on palliative chemotherapy.  Bleeding has now stopped after pressure dressing was applied by ED physician.   Ascites/anasarca -patient's albumin was 2.2 on 11-18, she has bilateral lower extremity edema as well as abdominal distention most likely from ascites.  Concern  for malignant ascites. -US paracentesis pending to evaluate whether it is malignant, initially patient refused procedure however now agreeable.  Likely to be on Monday  Hyperalbuminemia/malnutrition  Leukocytosis -chronic, improving. No fevers, no suspicion for infection   DVT prophylaxis: SCD Code Status: Full code Family Communication:  no family at bedside Disposition Plan: home 1 day  Consultants:   None   Procedures:   None   Antimicrobials:  None    Subjective: - no chest pain, shortness of breath, no abdominal pain, nausea or vomiting.    Objective: Vitals:   04/17/17 1600 04/17/17 1825 04/17/17 2055 04/18/17 0518  BP: 110/65 111/62 104/84 (!) 123/91  Pulse: 78 91 84 78  Resp: 17 18 18 20   Temp: 98.6 F (37 C) 98.9 F (37.2 C) 97.6 F (36.4 C) 97.9 F (36.6 C)  TempSrc: Oral Oral Oral Oral  SpO2: 100% 97% 100% 100%  Weight:      Height:        Intake/Output Summary (Last 24 hours) at 04/18/2017 1325 Last data filed at 04/18/2017 0448 Gross per 24 hour  Intake 315 ml  Output 1450 ml  Net -1135 ml   Filed Weights   04/16/17 1726 04/16/17 2154  Weight: 53.1 kg (117 lb) 56.1 kg (123 lb 10.9 oz)    Examination:  Constitutional: NAD Respiratory: CTA Cardiovascular: RRR    Data Reviewed: I have independently reviewed following labs and imaging studies   CBC: Recent Labs  Lab 04/16/17 1840 04/17/17 0500 04/18/17 0353  WBC 16.1* 10.9* 12.0*  HGB 5.2* 7.6* 8.7*  HCT 16.7* 23.1* 26.1*  MCV 81.1 83.7 83.9  PLT 361 299 536   Basic Metabolic Panel: Recent Labs  Lab 04/16/17 1840 04/17/17 0500  NA 136 137  K 4.3 4.2  CL 102 104  CO2 27 28  GLUCOSE 113* 77  BUN 12 10  CREATININE 0.69 0.71  CALCIUM 7.8* 7.7*   GFR:  Estimated Creatinine Clearance: 57.7 mL/min (by C-G formula based on SCr of 0.71 mg/dL). Liver Function Tests: Recent Labs  Lab 04/17/17 0500  AST 30  ALT 22  ALKPHOS 114  BILITOT 0.6  PROT 4.6*  ALBUMIN 1.6*   No results for input(s): LIPASE, AMYLASE in the last 168 hours. No results for input(s): AMMONIA in the last 168 hours. Coagulation Profile: No results for input(s): INR, PROTIME in the last 168 hours. Cardiac Enzymes: No results for input(s): CKTOTAL, CKMB, CKMBINDEX, TROPONINI in the last 168 hours. BNP (last 3 results) No results for  input(s): PROBNP in the last 8760 hours. HbA1C: No results for input(s): HGBA1C in the last 72 hours. CBG: No results for input(s): GLUCAP in the last 168 hours. Lipid Profile: No results for input(s): CHOL, HDL, LDLCALC, TRIG, CHOLHDL, LDLDIRECT in the last 72 hours. Thyroid Function Tests: No results for input(s): TSH, T4TOTAL, FREET4, T3FREE, THYROIDAB in the last 72 hours. Anemia Panel: No results for input(s): VITAMINB12, FOLATE, FERRITIN, TIBC, IRON, RETICCTPCT in the last 72 hours. Urine analysis:    Component Value Date/Time   COLORURINE YELLOW 03/27/2017 1459   APPEARANCEUR CLEAR 03/27/2017 1459   LABSPEC 1.012 03/27/2017 1459   PHURINE 5.0 03/27/2017 1459   GLUCOSEU NEGATIVE 03/27/2017 1459   HGBUR NEGATIVE 03/27/2017 1459   BILIRUBINUR NEGATIVE 03/27/2017 1459   KETONESUR 5 (A) 03/27/2017 1459   PROTEINUR NEGATIVE 03/27/2017 1459   NITRITE NEGATIVE 03/27/2017 1459   LEUKOCYTESUR TRACE (A) 03/27/2017 1459   Sepsis Labs: Invalid input(s): PROCALCITONIN, LACTICIDVEN  No results found for this or any previous visit (from the past 240 hour(s)).    Radiology Studies: No results found.   Scheduled Meds: . feeding supplement (ENSURE ENLIVE)  237 mL Oral TID BM  . gabapentin  600 mg Oral TID  . mirtazapine  15 mg Oral QHS  . multivitamin with minerals  1 tablet Oral Daily   Continuous Infusions:    Marzetta Board, MD, PhD [facility] Pager [phone]  If 7PM-7AM, please contact night-coverage [email or web address] Password [email or web address] 04/18/2017, 1:25 PM

## 2017-04-18 NOTE — Progress Notes (Signed)
Initial Nutrition Assessment  DOCUMENTATION CODES:   Non-severe (moderate) malnutrition in context of chronic illness  INTERVENTION:   -Continue Ensure Enlive po TID, each supplement provides 350 kcal and 20 grams of protein -Provide Magic cup BID with meals, each supplement provides 290 kcal and 9 grams of protein -Provide Multivitamin with minerals daily -RD will continue to monitor   NUTRITION DIAGNOSIS:   Moderate Malnutrition related to cancer and cancer related treatments, chronic illness as evidenced by moderate fat depletion, moderate muscle depletion.  GOAL:   Patient will meet greater than or equal to 90% of their needs  MONITOR:   PO intake, Supplement acceptance, Labs, Weight trends, I & O's, Skin  REASON FOR ASSESSMENT:   Consult Assessment of nutrition requirement/status  ASSESSMENT:   61 y.o. female,.. 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  Patient was seen by her home health nurse who instructed her to come to ED for further evaluation.  Patient denies fevers or chills. She was found to have a hemoglobin of 5.2  Patient currently receiving palliative chemotherapy for metastatic breast cancer. Pt awaiting paracentesis today. Pt with ascites. Did not eat any breakfast this morning.  Pt consumed 100% of breakfast and lunch yesterday 11/24. Has been drinking Ensure supplements. RD to also send Magic Cups on meal trays.  Per chart review, pt has gained weight but suspect this is likely fluid accumulation given ascites and moderate edema.   Labs reviewed. Medications: Remeron Sol-tab daily, Zofran tablet PRN  NUTRITION - FOCUSED PHYSICAL EXAM:    Most Recent Value  Orbital Region  Mild depletion  Upper Arm Region  Moderate depletion  Thoracic and Lumbar Region  Unable to assess   Buccal Region  Moderate depletion  Temple Region  Moderate depletion  Clavicle Bone Region  Moderate depletion  Clavicle and Acromion Bone Region  Moderate depletion  Scapular Bone Region  Unable to assess  Dorsal Hand  Moderate depletion  Patellar Region  Moderate depletion  Anterior Thigh Region  Unable to assess  Posterior Calf Region  Moderate depletion  Edema (RD Assessment)  Moderate       Diet Order:  Diet regular Room service appropriate? Yes; Fluid consistency: Thin  EDUCATION NEEDS:   No education needs have been identified at this time  Skin:  Skin Assessment: Skin Integrity Issues: Skin Integrity Issues:: Other (Comment) Other: non-pressure wound on left breast  Last BM:  11/23  Height:   Ht Readings from Last 1 Encounters:  04/16/17 5\' 2"  (1.575 m)    Weight:   Wt Readings from Last 1 Encounters:  04/16/17 123 lb 10.9 oz (56.1 kg)    Ideal Body Weight:  50 kg  BMI:  Body mass index is 22.62 kg/m.  Estimated Nutritional Needs:   Kcal:  1650-1850  Protein:  75-85g  Fluid:  1.8L/day  Clayton Bibles, MS, RD, LDN [place] [facility] Pager: [phone] After Hours Pager: [phone]

## 2017-04-18 NOTE — Progress Notes (Signed)
Patient ID: Ann Oliver, female   DOB: May 26, 1955, 61 y.o.   MRN: [id number]   Pt again refusing paracentesis today  I went to room yesterday to discuss procedure with pt She didn't want to talk about it Wanted to only talk to her MD

## 2017-04-18 NOTE — Progress Notes (Signed)
Nurse at bedside to give patient  meds and patient states that she now wants to go down to have paracentesis done. MD made aware

## 2017-04-18 NOTE — Progress Notes (Signed)
Nurse at bedside to change dressing per MD request prior to discharge. Patient refused to have dressing change at this time and wants to wait until after lunch. MD made aware.

## 2017-04-18 NOTE — Progress Notes (Signed)
Attempted to call IR to make aware of patient agreeable to having paracentesis. IR and radiology called with no response. Message left for IR.

## 2017-04-19 ENCOUNTER — Telehealth: Payer: Self-pay | Admitting: *Deleted

## 2017-04-19 LAB — TYPE AND SCREEN
ABO/RH(D): A POS
ANTIBODY SCREEN: NEGATIVE
UNIT DIVISION: 0
Unit division: 0
Unit division: 0

## 2017-04-19 LAB — BPAM RBC
BLOOD PRODUCT EXPIRATION DATE: 201812102359
Blood Product Expiration Date: 201812102359
Blood Product Expiration Date: 201812122359
ISSUE DATE / TIME: 201811232242
ISSUE DATE / TIME: 201811240055
ISSUE DATE / TIME: 201811241527
UNIT TYPE AND RH: 6200
UNIT TYPE AND RH: 6200
Unit Type and Rh: 6200

## 2017-04-19 LAB — CBC
HCT: 25.8 % — ABNORMAL LOW (ref 36.0–46.0)
Hemoglobin: 8.4 g/dL — ABNORMAL LOW (ref 12.0–15.0)
MCH: 27.7 pg (ref 26.0–34.0)
MCHC: 32.6 g/dL (ref 30.0–36.0)
MCV: 85.1 fL (ref 78.0–100.0)
PLATELETS: 306 10*3/uL (ref 150–400)
RBC: 3.03 MIL/uL — ABNORMAL LOW (ref 3.87–5.11)
RDW: 17.7 % — AB (ref 11.5–15.5)
WBC: 12.6 10*3/uL — ABNORMAL HIGH (ref 4.0–10.5)

## 2017-04-19 LAB — BASIC METABOLIC PANEL
Anion gap: 6 (ref 5–15)
BUN: 12 mg/dL (ref 6–20)
CALCIUM: 8 mg/dL — AB (ref 8.9–10.3)
CO2: 29 mmol/L (ref 22–32)
CREATININE: 0.96 mg/dL (ref 0.44–1.00)
Chloride: 102 mmol/L (ref 101–111)
GFR calc non Af Amer: >60 mL/min (ref >60)
Glucose, Bld: 89 mg/dL (ref 65–99)
Potassium: 4.5 mmol/L (ref 3.5–5.1)
SODIUM: 137 mmol/L (ref 135–145)

## 2017-04-19 NOTE — Progress Notes (Signed)
   04/19/17 1000  Clinical Encounter Type  Visited With Patient  Visit Type Follow-up;Psychological support;Spiritual support  Spiritual Encounters  Spiritual Needs Emotional;Other (Comment) (Spiritual Conversation/Support)  Stress Factors  Patient Stress Factors Financial concerns;Health changes   I followed up with the patient per previous visits. The patient was very receptive to my visit today. She was focused on her breast wound and how she felt that the wound was "getting better." Ms. Frediani still presents with a high need for support. During this visit she requested "canned goods" again, as well as a blanket to take with her.   Please, contact Spiritual Care for further assistance.   [place] M.Div.

## 2017-04-19 NOTE — Progress Notes (Signed)
PROGRESS NOTE    Ann Oliver  SEG:[id number] DOB: 14-Jan-1956 DOA: 04/16/2017 PCP: [facility], [facility]    Brief Narrative: Ann Oliver,..With history of metastatic fungating breast mass currently on palliative chemotherapy, chronic anemia, malnutrition, depression, with bilateral lower extremity edema who came to ED for worsening bleeding from breast mass. Patient was seen by her home health nurse who instructed her to come to ED for further evaluation. Patient denies fevers or chills. 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, right (HCC)   Blood loss anemia   Acute bleeding   Breast wound   Leukocytosis   Anemia   Acute blood loss anemia -secondary to acute blood loss from fungating breast mass -transfused a total of 3 units packed red blood cells - continue to monitor, she's continued to bleed with dressing changes - surgery c/s, appreciate recommendations (possible palliative resection?)  Bleeding from fungating breast mass -this has been mainchronic issue,she was earlier admitted on 11-18 with same complaint.Patient is on palliative chemotherapy.Bleeding has now stopped after pressure dressing was applied by ED physician. [ ]  her oncologist is aware of her admission, may consider palliative care c/s  Ascites/anasarca -patient'salbumin was 2.2 on 11-18, she has bilateral lower extremity edema as well as abdominal distention most likely from ascites.Concernfor malignant ascites. -US paracentesis pending to evaluate whether it is malignant, initially patient refused procedure however now agreeable.  Discussed with IR today on 11/26.   Hyperalbuminemia/malnutrition  Leukocytosis -chronic, improving. No fevers, no suspicion for infection  DVT prophylaxis: SCD Code  Status: full  Family Communication: none at bedside Disposition Plan: pending improvement in hemostasis, plan for discharge (per discussion with CM, too high risk for home health).   Consultants:   Surgery, oncology, IR  Procedures: (Don't include imaging studies which can be auto populated. Include things that cannot be auto populated i.e. Echo, Carotid and venous dopplers, Foley, Bipap, HD, tubes/drains, wound vac, central lines etc)  none  Antimicrobials: (specify start and planned stop date. Auto populated tables are space occupying and do not give end dates)  none    Subjective: Not sure about bleeding, dressing in place since yesterday. Notes LEE that seems better. Wants me to let her cancer doctor know she's here.  Objective: Vitals:   04/18/17 0518 04/18/17 1415 04/18/17 2143 04/19/17 0541  BP: (!) 123/91 132/68 116/69 129/79  Pulse: 78 90 95 86  Resp: 20 20 17 18   Temp: 97.9 F (36.6 C) 97.7 F (36.5 C) 98.9 F (37.2 C) 98.7 F (37.1 C)  TempSrc: Oral Oral Oral Oral  SpO2: 100% 100% 100% 100%  Weight:      Height:        Intake/Output Summary (Last 24 hours) at 04/19/2017 1119 Last data filed at 04/19/2017 0542 Gross per 24 hour  Intake 1320 ml  Output -  Net 1320 ml   Filed Weights   04/16/17 1726 04/16/17 2154  Weight: 53.1 kg (117 lb) 56.1 kg (123 lb 10.9 oz)    Examination:  General exam: Appears calm and comfortable  Respiratory system: Clear to auscultation. Respiratory effort normal. Cardiovascular system: S1 & S2 heard, RRR. No JVD, murmurs, rubs, gallops or clicks. No pedal edema. Gastrointestinal system: Abdomen is distended and nontender.  Central nervous system: Alert and oriented. No focal neurological deficits. Extremities: 2+ LEE bilaterally,  palpable DP pulsess.  Skin: dressing across torso, clean and dry Psychiatry: Judgement and insight appear normal. Mood & affect appropriate.     Data Reviewed: I have personally reviewed  following labs and imaging studies  CBC: Recent Labs  Lab 04/16/17 1840 04/17/17 0500 04/18/17 0353 04/19/17 0529  WBC 16.1* 10.9* 12.0* 12.6*  HGB 5.2* 7.6* 8.7* 8.4*  HCT 16.7* 23.1* 26.1* 25.8*  MCV 81.1 83.7 83.9 85.1  PLT 361 299 288 144   Basic Metabolic Panel: Recent Labs  Lab 04/16/17 1840 04/17/17 0500 04/19/17 0529  NA 136 137 137  K 4.3 4.2 4.5  CL 102 104 102  CO2 27 28 29   GLUCOSE 113* 77 89  BUN 12 10 12   CREATININE 0.69 0.71 0.96  CALCIUM 7.8* 7.7* 8.0*   GFR: Estimated Creatinine Clearance: 48.1 mL/min (by C-G formula based on SCr of 0.96 mg/dL). Liver Function Tests: Recent Labs  Lab 04/17/17 0500  AST 30  ALT 22  ALKPHOS 114  BILITOT 0.6  PROT 4.6*  ALBUMIN 1.6*   No results for input(s): LIPASE, AMYLASE in the last 168 hours. No results for input(s): AMMONIA in the last 168 hours. Coagulation Profile: No results for input(s): INR, PROTIME in the last 168 hours. Cardiac Enzymes: No results for input(s): CKTOTAL, CKMB, CKMBINDEX, TROPONINI in the last 168 hours. BNP (last 3 results) No results for input(s): PROBNP in the last 8760 hours. HbA1C: No results for input(s): HGBA1C in the last 72 hours. CBG: No results for input(s): GLUCAP in the last 168 hours. Lipid Profile: No results for input(s): CHOL, HDL, LDLCALC, TRIG, CHOLHDL, LDLDIRECT in the last 72 hours. Thyroid Function Tests: No results for input(s): TSH, T4TOTAL, FREET4, T3FREE, THYROIDAB in the last 72 hours. Anemia Panel: No results for input(s): VITAMINB12, FOLATE, FERRITIN, TIBC, IRON, RETICCTPCT in the last 72 hours. Sepsis Labs: No results for input(s): PROCALCITON, LATICACIDVEN in the last 168 hours.  No results found for this or any previous visit (from the past 240 hour(s)).       Radiology Studies: No results found.      Scheduled Meds: . feeding supplement (ENSURE ENLIVE)  237 mL Oral TID BM  . gabapentin  600 mg Oral TID  . mirtazapine  15 mg Oral  QHS  . multivitamin with minerals  1 tablet Oral Daily   Continuous Infusions:   LOS: 2 days    Time spent: over 30 minutes    Fayrene Helper, MD [facility] Pager [phone]  If 7PM-7AM, please contact night-coverage [email or web address] Password [email or web address] 04/19/2017, 11:19 AM

## 2017-04-19 NOTE — Progress Notes (Signed)
CC: Bleeding left chest Mass  Subjective: Patient is a 61 year old female with saw back in June 2018 with a fungating left breast mass previously diagnosed as a cancer in May 2017.  She had previously been seen by surgery and offered a mastectomy , which she declined on more than one occasion. She was scheduled for chemotherapy but was lost for follow-up initially. She is now getting chemotherapy.     She was seen in May 2018 and breast mass was treated with antibiotics and wet-to-dry dressing changes.  She has been seen on October 30, 2016, November 21, 2016, 12/13/2016, 8/30 and 01/22/17 by our service.    Her last visit was on 03/15/2017.   WE saw her last on 10/25-26/2018.  She is back with recurrent bleeding and H/H 5.2/16.7 on 04/16/17.  She continues to bleed.She has been transfused and H/H is better.  Dr. Excell Seltzer noted on 03/19/17 that even though the tumor is massive that he thought it could be resected if she would allow it.   I was ask by Margarita Grizzle McNichol's to help with the care.   On her last visit she was bleeding from the base of the tumor.  Today when we took the dressing down it is from the top of the tumor and is not really able to be controlled with just direct pressure.   We applied combat Gauze and 4 x 4's/ABD's/Kerlix and 6 inch Ace Bandages to control the bleeding. Picture is below.  The Tumor is necrotic and malodorous in parts.   She is currently on palliative treatment by Dr.Vinay Gudena. She has a PICC line and [facility] the [facility].   Home health has refused to follow her because the issue becomes more and more difficult to control.      Objective: Vital signs in last 24 hours: Temp:  [98.7 F (37.1 C)-98.9 F (37.2 C)] 98.7 F (37.1 C) (11/26 0541) Pulse Rate:  [86-95] 86 (11/26 0541) Resp:  [17-18] 18 (11/26 0541) BP: (116-129)/(69-79) 129/79 (11/26 0541) SpO2:  [100 %] 100 % (11/26 0541) Last BM Date: 04/18/17  Intake/Output from previous day: 11/25 0701 -  11/26 0700 In: 1320 [P.O.:1320] Out: -  Intake/Output this shift: No intake/output data recorded.  General appearance: alert, cooperative, no distress and somewhat anxious, but does not really seem to understand the difficulty caring for the tumor.   Chest wall: no tenderness, Chest mass left chest as note in picture, malodorus, I did  not measure it because of the bleeding.         Lab Results:  Recent Labs    04/18/17 0353 04/19/17 0529  WBC 12.0* 12.6*  HGB 8.7* 8.4*  HCT 26.1* 25.8*  PLT 288 306    BMET Recent Labs    04/17/17 0500 04/19/17 0529  NA 137 137  K 4.2 4.5  CL 104 102  CO2 28 29  GLUCOSE 77 89  BUN 10 12  CREATININE 0.71 0.96  CALCIUM 7.7* 8.0*   PT/INR No results for input(s): LABPROT, INR in the last 72 hours.  Recent Labs  Lab 04/17/17 0500  AST 30  ALT 22  ALKPHOS 114  BILITOT 0.6  PROT 4.6*  ALBUMIN 1.6*     Lipase     Component Value Date/Time   LIPASE 29 10/28/2016 1644     Medications: . feeding supplement (ENSURE ENLIVE)  237 mL Oral TID BM  . gabapentin  600 mg Oral TID  . mirtazapine  15 mg Oral QHS  . multivitamin with minerals  1 tablet Oral Daily   Anti-infectives (From admission, onward)   None       Assessment/Plan Bleeding from a fungating stage IV left breast cancer mass left lateral chest wall and axilla. Severe anemia secondary to bleeding Ascites/anasarca Malnutrition/deconditioning  FEN:  Regular diet ID:  No antibiotics DVT:  SCD/Anemia Foley: none Follow up:  Oncology      Plan:  I have applied Combat Gauze and pressure dressing.  I do not think there is any other way to control this currently short of direct pressure and coagulants.      LOS: 2 days    Maurie Olesen 04/19/2017 [phone]

## 2017-04-19 NOTE — Progress Notes (Signed)
Per [facility] rep, they are unable to continue home health wound care services for her due to issues with her wound bleeding at home. Previously, multiple home health companies refused to take her case as well. Marney Doctor RN,BSN,NCM [phone]

## 2017-04-19 NOTE — Consult Note (Signed)
[facility] Nurse wound consult note Reason for Consult: Patient, known to our team from previous admissions, seen today for dressing change.  Assisted by [facility] PA Will Creig Hines, who knows the patient as well. Wound type: neoplastic Pressure Injury POA: N/A Measurement: 25 x 20 approximated area that protrudes 10cm from chest wall. Wound bed: 80% yellow/green, 30% red, raw and bleeding friable tissue Drainage (amount, consistency, odor) frank bleeding, foul odor consistent with necrotic, fungating tumor Periwound:dry, intact. Dressing procedure/placement/frequency: Pilar Plate bleeding from tumor after taking dressing down; areas not noted to be bleeding previously are bleeding today, specifically the medial and distal-lateral areas. Bleeding is not slowed with firm pressure for 5 minutes. Combat coagulation dressing placed (1 package to cover tumor, applied in layers), this product is topped with 4x4s and ABDs (3) and secured with Kerlix roll gauze x2.  Pressure is held continuously. The kerlix is topped with 2, 6-inch ACE bandages applied firmly for compression/pressure.  Will Creig Hines has taken a photo today for communication with TEFL teacher. I do not know what frequency planned dressing changes should be and seek direction from [facility] on that. [facility] nursing team will not follow routinely, but we will remain available to this patient, the nursing, surgical and medical teams throughout her admission.  Please re-consult if needed in between visits or page for assistance with dressing change. Thanks, Maudie Flakes, MSN, RN, [place], Arther Abbott  Pager# [phone]

## 2017-04-19 NOTE — Telephone Encounter (Signed)
"  Fred RN [address] calling to let [name] know this patient is admitted and will not be able to keep the outpatient appointment at 11:00 am today."  Call information left on collaborative voicemail.

## 2017-04-20 ENCOUNTER — Encounter (HOSPITAL_COMMUNITY): Payer: Self-pay | Admitting: Radiology

## 2017-04-20 ENCOUNTER — Inpatient Hospital Stay (HOSPITAL_COMMUNITY): Payer: Medicaid Other

## 2017-04-20 DIAGNOSIS — G893 Neoplasm related pain (acute) (chronic): Secondary | ICD-10-CM

## 2017-04-20 LAB — COMPREHENSIVE METABOLIC PANEL
ALT: 19 U/L (ref 14–54)
AST: 28 U/L (ref 15–41)
Albumin: 1.6 g/dL — ABNORMAL LOW (ref 3.5–5.0)
Alkaline Phosphatase: 119 U/L (ref 38–126)
Anion gap: 5 (ref 5–15)
BUN: 15 mg/dL (ref 6–20)
CHLORIDE: 105 mmol/L (ref 101–111)
CO2: 29 mmol/L (ref 22–32)
CREATININE: 0.88 mg/dL (ref 0.44–1.00)
Calcium: 7.8 mg/dL — ABNORMAL LOW (ref 8.9–10.3)
GFR calc non Af Amer: >60 mL/min (ref >60)
Glucose, Bld: 95 mg/dL (ref 65–99)
POTASSIUM: 4.2 mmol/L (ref 3.5–5.1)
SODIUM: 139 mmol/L (ref 135–145)
Total Bilirubin: 0.4 mg/dL (ref 0.3–1.2)
Total Protein: 4.8 g/dL — ABNORMAL LOW (ref 6.5–8.1)

## 2017-04-20 LAB — CBC
HCT: 19.1 % — ABNORMAL LOW (ref 36.0–46.0)
Hemoglobin: 6.3 g/dL — CL (ref 12.0–15.0)
MCH: 28.4 pg (ref 26.0–34.0)
MCHC: 33 g/dL (ref 30.0–36.0)
MCV: 86 fL (ref 78.0–100.0)
PLATELETS: 282 10*3/uL (ref 150–400)
RBC: 2.22 MIL/uL — AB (ref 3.87–5.11)
RDW: 17.7 % — AB (ref 11.5–15.5)
WBC: 10.9 10*3/uL — AB (ref 4.0–10.5)

## 2017-04-20 LAB — PREPARE RBC (CROSSMATCH)

## 2017-04-20 MED ORDER — ACETAMINOPHEN 325 MG PO TABS
650.0000 mg | ORAL_TABLET | Freq: Four times a day (QID) | ORAL | Status: DC | PRN
Start: 2017-04-20 — End: 2017-04-28
  Administered 2017-04-20 – 2017-04-28 (×14): 650 mg via ORAL
  Filled 2017-04-20 (×14): qty 2

## 2017-04-20 MED ORDER — HYDROMORPHONE HCL 1 MG/ML IJ SOLN
1.0000 mg | INTRAMUSCULAR | Status: DC | PRN
  Administered 2017-04-20 – 2017-04-26 (×24): 1 mg via INTRAVENOUS
  Filled 2017-04-20 (×27): qty 1

## 2017-04-20 MED ORDER — OXYCODONE HCL 5 MG PO TABS
5.0000 mg | ORAL_TABLET | ORAL | Status: DC | PRN
  Administered 2017-04-20 – 2017-04-28 (×34): 10 mg via ORAL
  Filled 2017-04-20 (×9): qty 2
  Filled 2017-04-20: qty 1
  Filled 2017-04-20 (×9): qty 2
  Filled 2017-04-20: qty 1
  Filled 2017-04-20 (×16): qty 2

## 2017-04-20 MED ORDER — IOPAMIDOL (ISOVUE-300) INJECTION 61%
INTRAVENOUS | Status: AC
  Administered 2017-04-20: 100 mL via INTRAVENOUS
  Filled 2017-04-20: qty 100

## 2017-04-20 MED ORDER — SODIUM CHLORIDE 0.9 % IV SOLN
Freq: Once | INTRAVENOUS | Status: AC
  Administered 2017-04-20: 15:00:00 via INTRAVENOUS

## 2017-04-20 MED ORDER — IOPAMIDOL (ISOVUE-300) INJECTION 61%
INTRAVENOUS | Status: AC
  Administered 2017-04-20: 15 mL via ORAL
  Filled 2017-04-20: qty 30

## 2017-04-20 MED ORDER — IOPAMIDOL (ISOVUE-300) INJECTION 61%
15.0000 mL | Freq: Two times a day (BID) | INTRAVENOUS | Status: DC | PRN
  Administered 2017-04-20: 15 mL via ORAL
  Filled 2017-04-20: qty 30

## 2017-04-20 NOTE — Progress Notes (Signed)
HEMATOLOGY-ONCOLOGY PROGRESS NOTE  SUBJECTIVE: Patient has been hospitalized for severe bleeding from the necrotic left breast wound.  Extensive pain symptoms throughout her body    Breast cancer of upper-outer quadrant of left female breast ([place])   09/26/2015 Mammogram    Left breast 2:00 position suspicious mass 3.1 x 1.8 x 2.8 cm, indeterminate group of calcifications UIQ left breast needs stereotactic biopsy      10/22/2015 Initial Diagnosis    Left breast biopsy UOQ: Invasive adenocarcinoma, grade 2-3, EF 20%, PR 0%, Ki-67 30%, left breast UIQ: Fibroadenoma with extensive calcifications       Miscellaneous    Patient decided not to get any interventions and was lost to follow-up      05/06/2016 Imaging    CT chest: Heterogeneous necrotic mass left chest wall and UOQ 6.2 cm extending into axilla, ext into pectoralis major and to skin multiple enlarged left axillary LN, multiple more than 15 lung nodules, 1.5 cm lesion right lobe of the liver      09/25/2016 - 09/27/2016 Hospital Admission    Breast abscess (fungating tumor) 12.5 cm with axillary LN mets, mets to lungs      11/18/2016 -  Chemotherapy    Taxol every 3 weeks palliative chemotherapy         OBJECTIVE: REVIEW OF SYSTEMS:   Constitutional: Denies fevers, chills or abnormal weight loss Eyes: Denies blurriness of vision Ears, nose, mouth, throat, and face: Denies mucositis or sore throat Respiratory: Denies cough, dyspnea or wheezes Cardiovascular: Denies palpitation, chest discomfort Gastrointestinal:  Denies nausea, heartburn or change in bowel habits Skin: Necrotic left chest wall wound Lymphatics: Denies new lymphadenopathy or easy bruising Neurological:Denies numbness, tingling or new weaknesses Behavioral/Psych: Mood is stable, no new changes  Extremities: No lower extremity edema Breast: Large necrotic left breast cancer, profound bleeding All other systems were reviewed with the patient and are  negative.  I have reviewed the past medical history, past surgical history, social history and family history with the patient and they are unchanged from previous note.   PHYSICAL EXAMINATION: ECOG PERFORMANCE STATUS: 2 - Symptomatic, <50% confined to bed  Vitals:   04/19/17 2156 04/20/17 0532  BP: 112/67 108/89  Pulse: 95 95  Resp: 15 16  Temp: 99.5 F (37.5 C) 98.5 F (36.9 C)  SpO2: 100% 100%   Filed Weights   04/16/17 1726 04/16/17 2154  Weight: 117 lb (53.1 kg) 123 lb 10.9 oz (56.1 kg)    GENERAL:alert, no distress and comfortable SKIN: skin color, texture, turgor are normal, no rashes or significant lesions EYES: normal, Conjunctiva are pink and non-injected, sclera clear OROPHARYNX:no exudate, no erythema and lips, buccal mucosa, and tongue normal  NECK: supple, thyroid normal size, non-tender, without nodularity LYMPH:  no palpable lymphadenopathy in the cervical, axillary or inguinal LUNGS: clear to auscultation and percussion with normal breathing effort HEART: regular rate & rhythm and no murmurs and no lower extremity edema ABDOMEN:abdomen soft, non-tender and normal bowel sounds Musculoskeletal:no cyanosis of digits and no clubbing  NEURO: alert & oriented x 3 with fluent speech, no focal motor/sensory deficits  LABORATORY DATA:  I have reviewed the data as listed CMP Latest Ref Rng & Units 04/20/2017 04/19/2017 04/17/2017  Glucose 65 - 99 mg/dL 95 89 77  BUN 6 - 20 mg/dL 15 12 10   Creatinine 0.44 - 1.00 mg/dL 0.88 0.96 0.71  Sodium 135 - 145 mmol/L 139 137 137  Potassium 3.5 - 5.1 mmol/L 4.2 4.5 4.2  Chloride 101 - 111 mmol/L 105 102 104  CO2 22 - 32 mmol/L 29 29 28   Calcium 8.9 - 10.3 mg/dL 7.8(L) 8.0(L) 7.7(L)  Total Protein 6.5 - 8.1 g/dL 4.8(L) - 4.6(L)  Total Bilirubin 0.3 - 1.2 mg/dL 0.4 - 0.6  Alkaline Phos 38 - 126 U/L 119 - 114  AST 15 - 41 U/L 28 - 30  ALT 14 - 54 U/L 19 - 22    Lab Results  Component Value Date   WBC 10.9 (H) 04/20/2017    HGB 6.3 (LL) 04/20/2017   HCT 19.1 (L) 04/20/2017   MCV 86.0 04/20/2017   PLT 282 04/20/2017   NEUTROABS 14.9 (H) 04/02/2017    ASSESSMENT AND PLAN: 1. Severe bleeding from the left breast wound: Blood transfusion being given the pressure dressing has been applied. 2. Metastatic breast cancer: I would like to perform staging studies with CT chest abdomen pelvis.  This will help as discussed with her about prognosis and the care plan. Patient is not interested in hospice at this time.  However if he reviewed the scans we may be able to discuss this with more information that would be helpful to her to make these decisions. She is very interested in continuing with chemotherapy.   3.  Severe intractable pain not well controlled with oral pain medications: Prescribed her IV Dilaudid to be given every 4 hours as needed. 4.  Palliative care consultation has been requested. I will see the patient tomorrow to discuss the scan results with

## 2017-04-20 NOTE — Progress Notes (Signed)
PROGRESS NOTE    Ann Oliver  OAC:[id number] DOB: [birth year] DOA: 04/16/2017 PCP: [facility]    Brief Narrative: [name] a61 y.o.female,..With history of metastatic fungating breast mass currently on palliative chemotherapy, chronic anemia, malnutrition, depression, with bilateral lower extremity edema who came to ED for worsening bleeding from breast mass. Patient was seen by her home health nurse who instructed her to come to ED for further evaluation. Patient denies fevers or chills. 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, right ([facility])   Blood loss anemia   Acute bleeding   Breast wound   Leukocytosis   Anemia   Acute blood loss anemia -secondary to acute blood loss from fungating breast mass -transfused Ann Oliver total of 3 units packed red blood cells - repeat transfusion today given drop in Hb (discussed risks/benefits and she consented to transfusion) - continue to monitor, she's continued to bleed with dressing changes - surgery c/s, appreciate recommendations (possible palliative resection?)  Bleeding from fungating breast mass -this has been mainchronic issue,she was earlier admitted on 11-18 with same complaint.Patient is on palliative chemotherapy. - [name] saw her today.  She is interested in continuing chemotherapy.  She seems less interested in Ann Oliver surgical procedure which has been discussed with her by surgery.  [name] ordering staging studies and has also ordered palliative care consult.    Ascites/anasarca -patient'salbumin was 2.2 on 11-18, she has bilateral lower extremity edema as well as abdominal distention most likely from ascites.Concernfor malignant ascites. -Had discussed paracentesis with IR on 11/26, but not done.  Given her need for transfusion, plan for  staging with CT will plan on discussing this with onc prior to proceeding.   Hypoalbuminemia/malnutrition  Leukocytosis -chronic, improving. No fevers, no suspicion for infection  DVT prophylaxis: SCD Code Status: full  Family Communication: none at bedside Disposition Plan: pending improvement in hemostasis, plan for discharge (per discussion with CM, too high risk for home health).   Consultants:   Surgery, oncology, IR  Procedures: (Don't include imaging studies which can be auto populated. Include things that cannot be auto populated i.e. Echo, Carotid and venous dopplers, Foley, Bipap, HD, tubes/drains, wound vac, central lines etc)  none  Antimicrobials: (specify start and planned stop date. Auto populated tables are space occupying and do not give end dates)  none    Subjective: Discussed transfusion. No LH/dizziness. Wants to talk to [name] before making decisions about procedures.  Wants to continue chemotherapy.   Objective: Vitals:   04/19/17 0541 04/19/17 1516 04/19/17 2156 04/20/17 0532  BP: 129/79 96/62 112/67 108/89  Pulse: 86 (!) 102 95 95  Resp: 18 16 15 16   Temp: 98.7 F (37.1 C) 99.4 F (37.4 C) 99.5 F (37.5 C) 98.5 F (36.9 C)  TempSrc: Oral Oral Oral Oral  SpO2: 100% 100% 100% 100%  Weight:      Height:       No intake or output data in the 24 hours ending 04/20/17 1329 Filed Weights   04/16/17 1726 04/16/17 2154  Weight: 53.1 kg (117 lb) 56.1 kg (123 lb 10.9 oz)    Examination:  General: No acute distress. Cardiovascular: Heart sounds show Ann Oliver regular rate, and rhythm. No gallops or rubs. No murmurs. No JVD. Lungs: Clear to auscultation bilaterally with good air movement. No rales, rhonchi or  wheezes. Abdomen: Soft, distended with normal active bowel sounds. No masses. No hepatosplenomegaly. Neurological: Alert and oriented 3. Moves all extremities 4 with equal strength. Cranial nerves II through XII grossly intact. Skin:  dressing intact across torso Extremities: No clubbing or cyanosis. Bilateral LEE.    Data Reviewed: I have personally reviewed following labs and imaging studies  CBC: Recent Labs  Lab 04/16/17 1840 04/17/17 0500 04/18/17 0353 04/19/17 0529 04/20/17 0535  WBC 16.1* 10.9* 12.0* 12.6* 10.9*  HGB 5.2* 7.6* 8.7* 8.4* 6.3*  HCT 16.7* 23.1* 26.1* 25.8* 19.1*  MCV 81.1 83.7 83.9 85.1 86.0  PLT 361 299 288 306 323   Basic Metabolic Panel: Recent Labs  Lab 04/16/17 1840 04/17/17 0500 04/19/17 0529 04/20/17 0535  NA 136 137 137 139  K 4.3 4.2 4.5 4.2  CL 102 104 102 105  CO2 27 28 29 29   GLUCOSE 113* 77 89 95  BUN 12 10 12 15   CREATININE 0.69 0.71 0.96 0.88  CALCIUM 7.8* 7.7* 8.0* 7.8*   GFR: Estimated Creatinine Clearance: 52.4 mL/min (by C-G formula based on SCr of 0.88 mg/dL). Liver Function Tests: Recent Labs  Lab 04/17/17 0500 04/20/17 0535  AST 30 28  ALT 22 19  ALKPHOS 114 119  BILITOT 0.6 0.4  PROT 4.6* 4.8*  ALBUMIN 1.6* 1.6*   No results for input(s): LIPASE, AMYLASE in the last 168 hours. No results for input(s): AMMONIA in the last 168 hours. Coagulation Profile: No results for input(s): INR, PROTIME in the last 168 hours. Cardiac Enzymes: No results for input(s): CKTOTAL, CKMB, CKMBINDEX, TROPONINI in the last 168 hours. BNP (last 3 results) No results for input(s): PROBNP in the last 8760 hours. HbA1C: No results for input(s): HGBA1C in the last 72 hours. CBG: No results for input(s): GLUCAP in the last 168 hours. Lipid Profile: No results for input(s): CHOL, HDL, LDLCALC, TRIG, CHOLHDL, LDLDIRECT in the last 72 hours. Thyroid Function Tests: No results for input(s): TSH, T4TOTAL, FREET4, T3FREE, THYROIDAB in the last 72 hours. Anemia Panel: No results for input(s): VITAMINB12, FOLATE, FERRITIN, TIBC, IRON, RETICCTPCT in the last 72 hours. Sepsis Labs: No results for input(s): PROCALCITON, LATICACIDVEN in the last 168 hours.  No results  found for this or any previous visit (from the past 240 hour(s)).       Radiology Studies: No results found.      Scheduled Meds: . feeding supplement (ENSURE ENLIVE)  237 mL Oral TID BM  . gabapentin  600 mg Oral TID  . mirtazapine  15 mg Oral QHS  . multivitamin with minerals  1 tablet Oral Daily   Continuous Infusions: . sodium chloride       LOS: 3 days    Time spent: over 20 minutes    Fayrene Helper, MD [facility] Pager [phone]  If 7PM-7AM, please contact night-coverage [email or web address] Password [email or web address] 04/20/2017, 1:29 PM

## 2017-04-20 NOTE — Progress Notes (Signed)
On call notified of hgb of 6.3

## 2017-04-20 NOTE — Progress Notes (Signed)
Message sent tp Dr Garwin Brothers that her hgb is 6.3

## 2017-04-20 NOTE — Progress Notes (Signed)
Dr Florene Glen notified of temp 100.2  pre blood VS.

## 2017-04-20 NOTE — Progress Notes (Signed)
    CC: Bleeding necrotic left breast mass  Subjective: No bleeding thru the dressing placed yesterday. She still has an ongoing malignant necrotic, malodorous tumor.  When we came to see her she was most concerned about pain medicine.    Objective: Vital signs in last 24 hours: Temp:  [98.5 F (36.9 C)-99.5 F (37.5 C)] 98.5 F (36.9 C) (11/27 0532) Pulse Rate:  [95-102] 95 (11/27 0532) Resp:  [15-16] 16 (11/27 0532) BP: (96-112)/(62-89) 108/89 (11/27 0532) SpO2:  [100 %] 100 % (11/27 0532) Last BM Date: 04/18/17  Intake/Output from previous day: No intake/output data recorded. Intake/Output this shift: No intake/output data recorded.  General appearance: alert, cooperative and no distress Chest wall: no tenderness, remains malodorous, no bleeding with compression dressing.  Lab Results:  Recent Labs    04/19/17 0529 04/20/17 0535  WBC 12.6* 10.9*  HGB 8.4* 6.3*  HCT 25.8* 19.1*  PLT 306 282    BMET Recent Labs    04/19/17 0529 04/20/17 0535  NA 137 139  K 4.5 4.2  CL 102 105  CO2 29 29  GLUCOSE 89 95  BUN 12 15  CREATININE 0.96 0.88  CALCIUM 8.0* 7.8*   PT/INR No results for input(s): LABPROT, INR in the last 72 hours.  Recent Labs  Lab 04/17/17 0500 04/20/17 0535  AST 30 28  ALT 22 19  ALKPHOS 114 119  BILITOT 0.6 0.4  PROT 4.6* 4.8*  ALBUMIN 1.6* 1.6*     Lipase     Component Value Date/Time   LIPASE 29 10/28/2016 1644     Medications: . feeding supplement (ENSURE ENLIVE)  237 mL Oral TID BM  . gabapentin  600 mg Oral TID  . iopamidol      . mirtazapine  15 mg Oral QHS  . multivitamin with minerals  1 tablet Oral Daily    Assessment/Plan Bleeding from a fungating stage IV left breast cancer mass left lateral chest wall and axilla. Severe anemia secondary to bleeding Ascites/anasarca Malnutrition/deconditioning  FEN:  Regular diet ID:  No antibiotics DVT:  SCD/Anemia - transfused getting units 4&5 today Foley:  none Follow up:  Oncology      Plan: She has been seen by several in our group.   We have offered a palliative resection/debulking of the tumor in prior consults and yesterday.  If she is not interested in a resection, there is not allot we can do besides offer compression dressings. We have some Combat Gauze in the room and the dressing can be changed.    LOS: 3 days    Tangia Pinard 04/20/2017 [phone]

## 2017-04-21 DIAGNOSIS — R42 Dizziness and giddiness: Secondary | ICD-10-CM

## 2017-04-21 DIAGNOSIS — Z515 Encounter for palliative care: Secondary | ICD-10-CM

## 2017-04-21 LAB — COMPREHENSIVE METABOLIC PANEL
ALBUMIN: 1.8 g/dL — AB (ref 3.5–5.0)
ALK PHOS: 145 U/L — AB (ref 38–126)
ALT: 20 U/L (ref 14–54)
AST: 34 U/L (ref 15–41)
Anion gap: 4 — ABNORMAL LOW (ref 5–15)
BILIRUBIN TOTAL: 0.7 mg/dL (ref 0.3–1.2)
BUN: 13 mg/dL (ref 6–20)
CALCIUM: 8.1 mg/dL — AB (ref 8.9–10.3)
CO2: 29 mmol/L (ref 22–32)
CREATININE: 0.89 mg/dL (ref 0.44–1.00)
Chloride: 105 mmol/L (ref 101–111)
GFR calc Af Amer: >60 mL/min (ref >60)
GLUCOSE: 85 mg/dL (ref 65–99)
POTASSIUM: 4.3 mmol/L (ref 3.5–5.1)
Sodium: 138 mmol/L (ref 135–145)
TOTAL PROTEIN: 5.1 g/dL — AB (ref 6.5–8.1)

## 2017-04-21 LAB — CBC
HEMATOCRIT: 27.9 % — AB (ref 36.0–46.0)
HEMOGLOBIN: 9.1 g/dL — AB (ref 12.0–15.0)
MCH: 27.7 pg (ref 26.0–34.0)
MCHC: 32.6 g/dL (ref 30.0–36.0)
MCV: 85.1 fL (ref 78.0–100.0)
Platelets: 327 10*3/uL (ref 150–400)
RBC: 3.28 MIL/uL — ABNORMAL LOW (ref 3.87–5.11)
RDW: 16.5 % — AB (ref 11.5–15.5)
WBC: 9.6 10*3/uL (ref 4.0–10.5)

## 2017-04-21 LAB — HEMOGLOBIN AND HEMATOCRIT, BLOOD
HCT: 24 % — ABNORMAL LOW (ref 36.0–46.0)
HEMOGLOBIN: 7.9 g/dL — AB (ref 12.0–15.0)

## 2017-04-21 MED ORDER — HYDROMORPHONE HCL 1 MG/ML IJ SOLN
0.5000 mg | Freq: Once | INTRAMUSCULAR | Status: AC
  Administered 2017-04-21: 0.5 mg via INTRAVENOUS
  Filled 2017-04-21: qty 1

## 2017-04-21 MED ORDER — POLYETHYLENE GLYCOL 3350 17 G PO PACK
17.0000 g | PACK | Freq: Two times a day (BID) | ORAL | Status: DC
  Administered 2017-04-22 – 2017-04-28 (×6): 17 g via ORAL
  Filled 2017-04-21 (×8): qty 1

## 2017-04-21 MED ORDER — SILVER NITRATE-POT NITRATE 75-25 % EX MISC
2.0000 | Freq: Once | CUTANEOUS | Status: DC
  Filled 2017-04-21 (×2): qty 2

## 2017-04-21 NOTE — Progress Notes (Signed)
I sent a text message to Dr Florene Glen  that the H and H was 7.9 and 24.

## 2017-04-21 NOTE — Clinical Social Work Note (Signed)
Clinical Social Work Assessment  Patient Details  Name: Ann Oliver MRN: [id number] Date of Birth: September 13, 1955  Date of referral:  04/21/17               Reason for consult:  End of Life/Hospice                Permission sought to share information with:    Permission granted to share information::  No  Name::     asks that no family/friends are contacted  Agency::     Relationship::     Contact Information:     Housing/Transportation Living arrangements for the past 2 months:  Single Family Home, Apartment(staying with friends and family, transient) Source of Information:  Patient, Medical Team Patient Interpreter Needed:  None Criminal Activity/Legal Involvement Pertinent to Current Situation/Hospitalization:  No - Comment as needed Significant Relationships:  Friend Lives with:  Self(and with friends) Do you feel safe going back to the place where you live?  No Need for family participation in patient care:  No (Coment)  Care giving concerns:  Pt has a transient living situation, stays with friends/family typically (unclear if she still has her own home), reports she is unable to return there. Has been declining medically and prognosis is very limited (see palliative note). Pt care unlikely able to be managed at home due to lack of support and increased need.   Social Worker assessment / plan:  CSW consulted to assist with referral to residential hospice.   Met with pt at bedside to discuss. Pt open to residential hospice while also apprehensive about agreeing to a plan. Reports that she understands hospice is end of life/comfort care but reports she is unsure if she will like it there and is fixated on basic needs such as food/clothing that she will have when she is discharged. Pt has had transient living situation so concern about these needs is not inappropriate.  CSW and pt spent some time discussing her wishes and expectations re: hospice home. Pt states she would like to  see/tour facility prior to agreeing and CSW explained admission process and concern that if pt DC'd from hospital to admit to hospice without full expectation to remain there (as long as appropriate), pt would have no other safe home plan. Pt acknowledges this as well and states she would appreciate [facility] referral and reports she understands that "there's no treatment to make me better" and that her needs are "meals, clothes, medicine."  Pt asks that no family/friends are contacted re: her care.   CSW made referral to [facility] and will follow to assist with disposition.  Employment status:  Disabled (Comment on whether or not currently receiving Disability)(receives disability) Insurance information:  Medicaid [place] PT Recommendations:  Not assessed at this time Information / Referral to community resources:  (hospice home )  Patient/Family's Response to care:  Appreciative of care  Patient/Family's Understanding of and Emotional Response to Diagnosis, Current Treatment, and Prognosis:  Pt demonstrates understanding of prognosis and plan, was anxious re: agreeing to a certain plan. Her fixation on basic needs and apprehension re: DC planning was of concern to CSW, however, she demonstrate some teachback re: goals of this conversation as well as the goal of hospice and reports "this would probably be best."   Emotional Assessment Appearance:  Appears stated age Attitude/Demeanor/Rapport:  (engaged) Affect (typically observed):  Anxious Orientation:  Oriented to Self, Oriented to Place, Oriented to  Time, Oriented  to Situation Alcohol / Substance use:  Not Applicable Psych involvement (Current and /or in the community):  No (Comment)  Discharge Needs  Concerns to be addressed:  Decision making concerns, Care Coordination Readmission within the last 30 days:  Yes Current discharge risk:  Terminally ill, Lack of support system Barriers to Discharge:       Nila Nephew, LCSW 04/21/2017, 3:29 PM  [phone]

## 2017-04-21 NOTE — Progress Notes (Addendum)
PROGRESS NOTE    Ann Oliver  OZD:[id number] DOB: June 23, 1955 DOA: 04/16/2017 PCP: [facility]    Brief Narrative: [name] a61 y.o.female,..With history of metastatic fungating breast mass currently on palliative chemotherapy, chronic anemia, malnutrition, depression, with bilateral lower extremity edema who came to ED for worsening bleeding from breast mass. Patient was seen by her home health nurse who instructed her to come to ED for further evaluation. Patient denies fevers or chills. 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  Repeat H/H down to 7.9, f/u repeat tomorrow (pt currently asx).   Bleeding from fungating breast mass -this has been mainchronic issue,she was earlier admitted on 11-18 with same complaint.Patient is on palliative chemotherapy. - Staging CT's ordered by Dr. Barnett Abu with "overall disease progression" (see report)"  - [name] saw her today and discussed her poor prognosis.  She discussed with palliative care and is now DNR/DNI.  Likely residential hospice at d/c.   Ascites/anasarca -patient'salbumin was 2.2 on 11-18, she has bilateral lower extremity edema as well as abdominal distention most likely from ascites.Concernfor malignant ascites. -Initially planned  paracentesis, but not going to change management at this point.  Can pursue this for comfort if needed.  Hypoalbuminemia/malnutrition  Leukocytosis -improved. No fevers (elevated temp yesterday, but no true fever).  Continue to monitor.  DVT prophylaxis: SCD Code Status: DNR Family Communication: none at bedside Disposition Plan: pending improvement in hemostasis, plan for discharge (per discussion with CM, too high risk for home health).   Consultants:   Surgery, oncology, IR  Procedures: (Don't include imaging studies which can be auto populated. Include things that cannot be auto populated i.e. Echo, Carotid and venous dopplers, Foley, Bipap, HD, tubes/drains, wound vac, central lines etc)  none  Antimicrobials: (specify start and planned stop date. Auto populated tables are space occupying and do not give end dates)  none    Subjective: Doing ok.  No LH, dizziness.  C/o pain later when seen with dressing change.  Objective: Vitals:   04/20/17 2325 04/21/17 0625 04/21/17 1428 04/21/17 1600  BP: 108/62 116/71 112/69   Pulse:  84 (!) 121 (!) 104  Resp: 16 16 16    Temp: 98.4 F (36.9 C) 98.4 F (36.9 C) 99.3 F (37.4 C)   TempSrc: Oral Oral Oral   SpO2: 100% 98% 100%   Weight:      Height:        Intake/Output Summary (Last 24 hours) at 04/21/2017 2052 Last data filed at 04/21/2017 0940 Gross per 24 hour  Intake 563 ml  Output -  Net 563 ml   Filed Weights   04/16/17 1726 04/16/17 2154  Weight: 53.1 kg (117 lb) 56.1 kg (123 lb 10.9 oz)    Examination:  General: No acute distress. Cardiovascular: Heart sounds show Caydyn Sprung regular  rate, and rhythm. No gallops or rubs. No murmurs. No JVD. Lungs: Clear to auscultation bilaterally with good air movement. No rales, rhonchi or wheezes. Abdomen: Soft, distended, nondistended with normal active bowel sounds. No masses. No hepatosplenomegaly. Neurological: Alert and oriented 3. Moves all extremities 4 with equal  strength. Cranial nerves II through XII grossly intact. Skin: Large bleeding tumor to L chest Extremities: No clubbing or cyanosis. No edema.  Psychiatric: Mood and affect are normal. Insight and judgment are appropriate.   Data Reviewed: I have personally reviewed following labs and imaging studies  CBC: Recent Labs  Lab 04/17/17 0500 04/18/17 0353 04/19/17 0529 04/20/17 0535 04/21/17 0615 04/21/17 1316  WBC 10.9* 12.0* 12.6* 10.9* 9.6  --   HGB 7.6* 8.7* 8.4* 6.3* 9.1* 7.9*  HCT 23.1* 26.1* 25.8* 19.1* 27.9* 24.0*  MCV 83.7 83.9 85.1 86.0 85.1  --   PLT 299 288 306 282 327  --    Basic Metabolic Panel: Recent Labs  Lab 04/16/17 1840 04/17/17 0500 04/19/17 0529 04/20/17 0535 04/21/17 0615  NA 136 137 137 139 138  K 4.3 4.2 4.5 4.2 4.3  CL 102 104 102 105 105  CO2 27 28 29 29 29   GLUCOSE 113* 77 89 95 85  BUN 12 10 12 15 13   CREATININE 0.69 0.71 0.96 0.88 0.89  CALCIUM 7.8* 7.7* 8.0* 7.8* 8.1*   GFR: Estimated Creatinine Clearance: 51.8 mL/min (by C-G formula based on SCr of 0.89 mg/dL). Liver Function Tests: Recent Labs  Lab 04/17/17 0500 04/20/17 0535 04/21/17 0615  AST 30 28 34  ALT 22 19 20   ALKPHOS 114 119 145*  BILITOT 0.6 0.4 0.7  PROT 4.6* 4.8* 5.1*  ALBUMIN 1.6* 1.6* 1.8*   No results for input(s): LIPASE, AMYLASE in the last 168 hours. No results for input(s): AMMONIA in the last 168 hours. Coagulation Profile: No results for input(s): INR, PROTIME in the last 168 hours. Cardiac Enzymes: No results for input(s): CKTOTAL, CKMB, CKMBINDEX, TROPONINI in the last 168 hours. BNP (last 3 results) No results for input(s): PROBNP in the last 8760 hours. HbA1C: No results for input(s): HGBA1C in the last 72 hours. CBG: No results for input(s): GLUCAP in the last 168 hours. Lipid Profile: No results for input(s): CHOL, HDL, LDLCALC, TRIG, CHOLHDL, LDLDIRECT in the last 72 hours. Thyroid Function Tests: No results for input(s): TSH, T4TOTAL,  FREET4, T3FREE, THYROIDAB in the last 72 hours. Anemia Panel: No results for input(s): VITAMINB12, FOLATE, FERRITIN, TIBC, IRON, RETICCTPCT in the last 72 hours. Sepsis Labs: No results for input(s): PROCALCITON, LATICACIDVEN in the last 168 hours.  No results found for this or any previous visit (from the past 240 hour(s)).       Radiology Studies: Ct Chest W Contrast  Result Date: 04/21/2017 CLINICAL DATA:  Breast cancer. Restaging. Non osseous recurrent suspected. EXAM: CT CHEST, ABDOMEN, AND PELVIS WITH CONTRAST TECHNIQUE: Multidetector CT imaging of the chest, abdomen and pelvis was performed following the standard protocol during bolus administration of intravenous contrast. CONTRAST:  179mL ISOVUE-300 IOPAMIDOL (ISOVUE-300) INJECTION 61%, 73mL ISOVUE-300 IOPAMIDOL (ISOVUE-300) INJECTION 61% COMPARISON:  Plain film 03/18/2017.  10/28/2016 CT. FINDINGS: CT CHEST FINDINGS Cardiovascular: Aortic atherosclerosis. Mild cardiomegaly, without pericardial effusion. No central pulmonary embolism, on this non-dedicated study. Right-sided PICC line which terminates at the high right atrium. Mediastinum/Nodes: Left supraclavicular node of 9 mm on image 8/series 2 is similar. Well-circumscribed hypoattenuating right thyroid nodule is grossly similar and of doubtful clinical significance. left axillary nodes  are decreased, with an index measuring 1.0 cm today versus 2.1 cm on the prior exam (when remeasured). Ailsa Mireles left subpectoral node measures 11 mm today versus 9 mm on the prior exam (when remeasured). No mediastinal or hilar adenopathy. Lungs/Pleura: Trace bilateral pleural fluid, new. Bilateral pulmonary nodules consistent with metastasis. Index left upper lobe pulmonary nodule measures 1.4 cm on image 46/ series 4 versus 1.1 cm on the prior. An index right lower lobe pulmonary nodule measures 9 mm on image 88/series 4 versus 8 mm on the prior exam (when remeasured). Inferior lingular nodule measures 1.8 cm  on image 84/ series 4 and is similar 1.9 cm on the prior. Subpleural left upper lobe 1.3 cm nodule on image 67/series 4 is new. Musculoskeletal: Left chest wall mass measures 15.2 x 6.2 by 17.2 cm today versus 11.4 x 8.3 cm by 14.7 on the prior exam (when remeasured). Nathen Balaban mild compression deformity involving the superior endplate of J33 is not significantly changed. CT ABDOMEN PELVIS FINDINGS Hepatobiliary: Segment 2 hypoattenuating lesion measures 11 mm on image 46/ series 2 versus 16 mm on the prior. No new liver lesion. Normal gallbladder. Upper normal common duct size, 7 mm. No obstructive cause identified. Pancreas: Normal, without mass or ductal dilatation. Spleen: Normal in size, without focal abnormality. Adrenals/Urinary Tract: Normal adrenal glands. 2.1 cm right renal cyst. Normal left kidney. No hydronephrosis. Normal urinary bladder. Stomach/Bowel: Normal stomach, without wall thickening. Colonic stool burden suggests constipation. Normal small bowel. Vascular/Lymphatic: Normal caliber of the aorta and branch vessels. Necrotic porta hepatis node of 1.9 x 2.5 cm on image 60/series 2. Similar to minimally enlarged from 1.8 x 2.2 on the prior exam. No pelvic sidewall adenopathy. Reproductive: Suspect Marquette Blodgett small uterine fundal fibroid on image 102/series 2. No adnexal mass. Other: Mild pelvic floor laxity. No significant free fluid. Anasarca. Musculoskeletal: Probable degenerative sclerosis about the left sacroiliac joint, similar. IMPRESSION: 1. Mixed response, with overall disease progression. 2. Enlargement of left chest wall mass with improvement in left axillary but slight increase in left subpectoral adenopathy. Similar left supraclavicular adenopathy. 3. Progression of pulmonary metastasis. 4. Improvement in isolated hepatic metastasis with enlargement of necrotic periportal adenopathy. 5. Small bilateral pleural effusions. 6.  Possible constipation. Electronically Signed   By: Abigail Miyamoto M.D.   On:  04/21/2017 10:04   Ct Abdomen Pelvis W Contrast  Result Date: 04/21/2017 CLINICAL DATA:  Breast cancer. Restaging. Non osseous recurrent suspected. EXAM: CT CHEST, ABDOMEN, AND PELVIS WITH CONTRAST TECHNIQUE: Multidetector CT imaging of the chest, abdomen and pelvis was performed following the standard protocol during bolus administration of intravenous contrast. CONTRAST:  178mL ISOVUE-300 IOPAMIDOL (ISOVUE-300) INJECTION 61%, 25mL ISOVUE-300 IOPAMIDOL (ISOVUE-300) INJECTION 61% COMPARISON:  Plain film 03/18/2017.  10/28/2016 CT. FINDINGS: CT CHEST FINDINGS Cardiovascular: Aortic atherosclerosis. Mild cardiomegaly, without pericardial effusion. No central pulmonary embolism, on this non-dedicated study. Right-sided PICC line which terminates at the high right atrium. Mediastinum/Nodes: Left supraclavicular node of 9 mm on image 8/series 2 is similar. Well-circumscribed hypoattenuating right thyroid nodule is grossly similar and of doubtful clinical significance. left axillary nodes are decreased, with an index measuring 1.0 cm today versus 2.1 cm on the prior exam (when remeasured). Anda Sobotta left subpectoral node measures 11 mm today versus 9 mm on the prior exam (when remeasured). No mediastinal or hilar adenopathy. Lungs/Pleura: Trace bilateral pleural fluid, new. Bilateral pulmonary nodules consistent with metastasis. Index left upper lobe pulmonary nodule measures 1.4 cm on image 46/ series 4 versus 1.1 cm on  the prior. An index right lower lobe pulmonary nodule measures 9 mm on image 88/series 4 versus 8 mm on the prior exam (when remeasured). Inferior lingular nodule measures 1.8 cm on image 84/ series 4 and is similar 1.9 cm on the prior. Subpleural left upper lobe 1.3 cm nodule on image 67/series 4 is new. Musculoskeletal: Left chest wall mass measures 15.2 x 6.2 by 17.2 cm today versus 11.4 x 8.3 cm by 14.7 on the prior exam (when remeasured). Mariateresa Batra mild compression deformity involving the superior endplate of  Y65 is not significantly changed. CT ABDOMEN PELVIS FINDINGS Hepatobiliary: Segment 2 hypoattenuating lesion measures 11 mm on image 46/ series 2 versus 16 mm on the prior. No new liver lesion. Normal gallbladder. Upper normal common duct size, 7 mm. No obstructive cause identified. Pancreas: Normal, without mass or ductal dilatation. Spleen: Normal in size, without focal abnormality. Adrenals/Urinary Tract: Normal adrenal glands. 2.1 cm right renal cyst. Normal left kidney. No hydronephrosis. Normal urinary bladder. Stomach/Bowel: Normal stomach, without wall thickening. Colonic stool burden suggests constipation. Normal small bowel. Vascular/Lymphatic: Normal caliber of the aorta and branch vessels. Necrotic porta hepatis node of 1.9 x 2.5 cm on image 60/series 2. Similar to minimally enlarged from 1.8 x 2.2 on the prior exam. No pelvic sidewall adenopathy. Reproductive: Suspect Armoni Depass small uterine fundal fibroid on image 102/series 2. No adnexal mass. Other: Mild pelvic floor laxity. No significant free fluid. Anasarca. Musculoskeletal: Probable degenerative sclerosis about the left sacroiliac joint, similar. IMPRESSION: 1. Mixed response, with overall disease progression. 2. Enlargement of left chest wall mass with improvement in left axillary but slight increase in left subpectoral adenopathy. Similar left supraclavicular adenopathy. 3. Progression of pulmonary metastasis. 4. Improvement in isolated hepatic metastasis with enlargement of necrotic periportal adenopathy. 5. Small bilateral pleural effusions. 6.  Possible constipation. Electronically Signed   By: Abigail Miyamoto M.D.   On: 04/21/2017 10:04        Scheduled Meds: . feeding supplement (ENSURE ENLIVE)  237 mL Oral TID BM  . gabapentin  600 mg Oral TID  . mirtazapine  15 mg Oral QHS  . multivitamin with minerals  1 tablet Oral Daily  . silver nitrate applicators  2 Stick Topical Once   Continuous Infusions:    LOS: 4 days    Time spent:  over 20 minutes    Fayrene Helper, MD [facility] Pager [phone]  If 7PM-7AM, please contact night-coverage [email or web address] Password [facility] 04/21/2017, 8:52 PM

## 2017-04-21 NOTE — Consult Note (Addendum)
[facility] Nurse wound consult note Reason for Consult: Left breast mass dressing change. Dressing takedown by [facility] nurse and WTA-C created several bleeds with pulsation and spray.  Called for assistance by [facility].  Dr. Florene Glen also to bedside.  Patient exressed lightheadedness.  BP stable at 118/89. Bedside RN assisted with supplies and medicating patient. Wound type: Neoplastic Pressure Injury POA: N/A Measurement:seeM Monday Wound bed:See Monday Drainage (amount, consistency, odor) Frank bleeding Periwound:dry, intact Dressing procedure/placement/frequency: Direct pressure held for 15 minutes prior to arrival of CCS and Dr. Florene Glen. 10 applications of silver nitrate by CCS PA Will Creig Hines with little effect, 2 packages of combat gauze applied, followed by 4x4s, ABDs, Kerlix and 2, 6-inch ACE bandage applications. Patient is medicated for pain, linen changed and patient washed. Will need to do dressing changes with CCS PA.  Current Plan for M/W/F dressing changes. [facility] nursing team will follow, and will remain available to this patient, the nursing and medical teams.   Thanks, Maudie Flakes, MSN, RN, [place], Arther Abbott  Pager# [phone]

## 2017-04-21 NOTE — Progress Notes (Signed)
HEMATOLOGY-ONCOLOGY PROGRESS NOTE  SUBJECTIVE: Profuse bleeding from the fungating breast cancer, patient felt dizzy lightheaded after the bleeding was stopped with extensive pressure bandaging.  OBJECTIVE: REVIEW OF SYSTEMS:   Constitutional: Denies fevers, chills or abnormal weight loss Eyes: Denies blurriness of vision Ears, nose, mouth, throat, and face: Denies mucositis or sore throat Respiratory: Denies cough, dyspnea or wheezes Cardiovascular: Denies palpitation, chest discomfort Gastrointestinal:  Denies nausea, heartburn or change in bowel habits Skin: Denies abnormal skin rashes Lymphatics: Denies new lymphadenopathy or easy bruising Neurological:Denies numbness, tingling or new weaknesses Behavioral/Psych: Mood is stable, no new changes  Extremities: No lower extremity edema Breast: Profound bleeding from the breast tumor All other systems were reviewed with the patient and are negative.  I have reviewed the past medical history, past surgical history, social history and family history with the patient and they are unchanged from previous note.   PHYSICAL EXAMINATION: ECOG PERFORMANCE STATUS: 3 - Symptomatic, >50% confined to bed  Vitals:   04/20/17 2325 04/21/17 0625  BP: 108/62 116/71  Pulse:  84  Resp: 16 16  Temp: 98.4 F (36.9 C) 98.4 F (36.9 C)  SpO2: 100% 98%   Filed Weights   04/16/17 1726 04/16/17 2154  Weight: 117 lb (53.1 kg) 123 lb 10.9 oz (56.1 kg)    GENERAL:alert, no distress and comfortable SKIN: Dry skin EYES: normal, Conjunctiva are pink and non-injected, sclera clear OROPHARYNX:no exudate, no erythema and lips, buccal mucosa, and tongue normal  NECK: supple, thyroid normal size, non-tender, without nodularity LUNGS: clear to auscultation and percussion with normal breathing effort HEART: regular rate & rhythm and no murmurs and no lower extremity edema ABDOMEN:abdomen soft, non-tender and normal bowel sounds Musculoskeletal:no cyanosis  of digits and no clubbing  NEURO: alert & oriented x 3 with fluent speech, no focal motor/sensory deficits  LABORATORY DATA:  I have reviewed the data as listed CMP Latest Ref Rng & Units 04/21/2017 04/20/2017 04/19/2017  Glucose 65 - 99 mg/dL 85 95 89  BUN 6 - 20 mg/dL 13 15 12   Creatinine 0.44 - 1.00 mg/dL 0.89 0.88 0.96  Sodium 135 - 145 mmol/L 138 139 137  Potassium 3.5 - 5.1 mmol/L 4.3 4.2 4.5  Chloride 101 - 111 mmol/L 105 105 102  CO2 22 - 32 mmol/L 29 29 29   Calcium 8.9 - 10.3 mg/dL 8.1(L) 7.8(L) 8.0(L)  Total Protein 6.5 - 8.1 g/dL 5.1(L) 4.8(L) -  Total Bilirubin 0.3 - 1.2 mg/dL 0.7 0.4 -  Alkaline Phos 38 - 126 U/L 145(H) 119 -  AST 15 - 41 U/L 34 28 -  ALT 14 - 54 U/L 20 19 -    Lab Results  Component Value Date   WBC 9.6 04/21/2017   HGB 9.1 (L) 04/21/2017   HCT 27.9 (L) 04/21/2017   MCV 85.1 04/21/2017   PLT 327 04/21/2017   NEUTROABS 14.9 (H) 04/02/2017    ASSESSMENT AND PLAN: 1.  Metastatic breast cancer with extensive lung and liver and lymph node metastases along with a fungating tumor in the left chest wall and axilla I reviewed the CT scan with the patient and provided her with a copy of the report. There was evidence of progression of the lung metastases as well as a chest wall tumor. I discussed with the patient that no matter what chemotherapy we use, patient will died from heart disease.  I believe she has poor prognosis and has less than 6 months to live.  2. profound bleeding from the  tumor: I suspect that the process will continue in spite of our best efforts. I discussed with the patient that we will consult hospice and they will discussed with her her options. She is in agreement with the hospice care decision.

## 2017-04-21 NOTE — Progress Notes (Signed)
Pt medicated with po pain med.  Pt constantly asking when her pain meds can be given and continues to ask for her hs gabapentin that she already had.  Spent several minutes at frequent intervals educating pt on the pain meds that she has ordered and frequency they are allowed.  Needs much reinforcement.

## 2017-04-21 NOTE — Progress Notes (Signed)
Pt continues to ask about ASA and Tylenol even after is has been explained to  Her

## 2017-04-21 NOTE — Consult Note (Signed)
Consultation Note Date: 04/21/2017   Patient Name: Ann Oliver  DOB: October 28, 1955  MRN: [id number]  Age / Sex: 61 y.o., female  PCP: [facility] Referring Physician: Elodia Oliver., *  Reason for Consultation: Establishing goals of care  HPI/Patient Profile: 61 y.o. female  with past medical history of fungating stage IV breast cancer admitted on 04/16/2017 with bleeding from her cancer, severe anemia, ongoing malnutrition and deconditioning.   A palliative consult has been placed for goals of care discussions.    Clinical Assessment and Goals of Care: Ann Oliver is known to our service, several palliative providers have seen her in the past.   Earlier today, she had severe bleeding from her fungating stage IV breast cancer mass, L lateral chest wall and axilla that required pressure dressing and combat gauze.   She has undergone repeat imaging, showing evidence of progression of the lung metastases as well as a chest wall tumor, hence, from a medical oncology perspective, it is recommended that she be considered for hospice services.   A palliative consult has been requested for goals of care discussions.   Ann Oliver is awake alert resting in bed, I introduced myself and palliative care as follows: Palliative medicine is specialized medical care for people living with serious illness. It focuses on providing relief from the symptoms and stress of a serious illness. The goal is to improve quality of life for both the patient and the family.  The patient says, "yeah, I heard you're coming, are you hospice?"  She just met with Dr Ann Oliver and also with surgery. We discussed about why she is in the hospital and the serious incurable nature of her illness.   The patient is mostly concerned about having a safe place to stay, having clean clothes, having her pain controlled  and having some wound care dressing changes done, so that she doesn't have "old blood on my wound here", pointing to her breast.   We discussed about code status in great detail. I discussed about how CPR and other aggressive measures at end of life would cause her more harm than good. She wishes to be kept as comfortable as possible at end of life.   We reviewed hospice philosophy of care in detail, particularly the type of care that can be provided in residential hospice discussed in detail. All of her questions answered to the best of my ability.      NEXT OF KIN  there is no established next of kin, patient reportedly has several adult children, we do not have their contact information, patient forbids [place] from contacting next of kin. "I told you I got no one, now quit asking."   SUMMARY OF RECOMMENDATIONS    1. Code status now established as DNR DNI 2. Continue pain management and dressing changes 3. Recommend residential hospice on d/c.  4. Prognosis could be 2-3 weeks, possibly significantly less as her necrotic wound is bleeding, her malignancy progressing, oral intake declining, pain and non pain symptom  management needs escalating.   Code Status/Advance Care Planning:  DNR    Symptom Management:    as above   Palliative Prophylaxis:   Delirium Protocol  Additional Recommendations (Limitations, Scope, Preferences):  Full Comfort Care  Psycho-social/Spiritual:   Desire for further Chaplaincy support:yes  Additional Recommendations: Education on Hospice  Prognosis:   < 4 weeks  Discharge Planning: Hospice facility      Primary Diagnoses: Present on Admission: . Acute bleeding . Blood loss anemia . 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 Breast cancer of upper-outer quadrant of left female breast (Ann Oliver) . Breast wound . Leukocytosis . Anemia   I have reviewed the medical record, interviewed the patient and family, and examined the patient.  The following aspects are pertinent.  Past Medical History:  Diagnosis Date  . Cancer (Ann Oliver)   . Sinusitis   . Tumor cells    Social History   Socioeconomic History  . Marital status: Single    Spouse name: None  . Number of children: None  . Years of education: None  . Highest education level: None  Social Needs  . Financial resource strain: None  . Food insecurity - worry: None  . Food insecurity - inability: None  . Transportation needs - medical: None  . Transportation needs - non-medical: None  Occupational History  . None  Tobacco Use  . Smoking status: Never Smoker  . Smokeless tobacco: Never Used  Substance and Sexual Activity  . Alcohol use: No  . Drug use: No  . Sexual activity: None  Other Topics Concern  . None  Social History Narrative  . None   Family History  Problem Relation Age of Onset  . Breast cancer Neg Hx    Scheduled Meds: . feeding supplement (ENSURE ENLIVE)  237 mL Oral TID BM  . gabapentin  600 mg Oral TID  . mirtazapine  15 mg Oral QHS  . multivitamin with minerals  1 tablet Oral Daily  . silver nitrate applicators  2 Stick Topical Once   Continuous Infusions: PRN Meds:.acetaminophen, HYDROmorphone (DILAUDID) injection, iopamidol, ondansetron **OR** ondansetron (ZOFRAN) IV, oxyCODONE, sodium chloride flush Medications Prior to Admission:  Prior to Admission medications   Medication Sig Start Date End Date Taking? Authorizing Provider  gabapentin (NEURONTIN) 600 MG tablet Take 1 tablet (600 mg total) 3 (three) times daily by mouth. 04/05/17  Yes Ann Lose, MD  ondansetron (ZOFRAN) 8 MG tablet take 1 tablet by mouth every 8 hours if needed for nausea and vomiting 04/13/17  Yes Oliver, Ann Massed, NP  oxyCODONE-acetaminophen (PERCOCET/ROXICET) 5-325 MG tablet Take 2 tablets by mouth every 4 (four) hours as needed for severe pain. 03/24/17  Yes Ann Haff, MD  ANUCORT-HC 25 MG suppository unwrap and insert 1 suppository  rectally twice a day if needed for HEMORRHOIDS OR ANAL ITCHING 04/12/17   Oliver, Ann Massed, NP  feeding supplement, ENSURE ENLIVE, (ENSURE ENLIVE) LIQD Take 237 mLs by mouth 3 (three) times daily between meals. Patient not taking: Reported on 04/16/2017 09/26/16   [name], MD  loperamide (IMODIUM) 2 MG capsule Take 1 capsule (2 mg total) as needed by mouth for diarrhea or loose stools. Take as directed on package 03/30/17   Domenic Polite, MD  mirtazapine (REMERON SOL-TAB) 15 MG disintegrating tablet Take 1 tablet (15 mg total) by mouth at bedtime. Patient not taking: Reported on 04/16/2017 03/24/17   Ann Haff, MD   Allergies  Allergen Reactions  . Aspirin Palpitations  Review of Systems +pain in breast and L arm area  Physical Exam Awake alert Non focal Lungs clear Abdomen soft Thin dry skin muscle wasting evident Wound dressed on chest No edema  Vital Signs: BP 116/71 (BP Location: Left Arm)   Pulse 84   Temp 98.4 F (36.9 C) (Oral)   Resp 16   Ht _0  (1.575 m)   Wt 56.1 kg (123 lb 10.9 oz)   SpO2 98%   BMI 22.62 kg/m  Pain Assessment: 0-10   Pain Score: 10-Worst pain ever   SpO2: SpO2: 98 % O2 Device:SpO2: 98 % O2 Flow Rate: .   IO: Intake/output summary:   Intake/Output Summary (Last 24 hours) at 04/21/2017 1413 Last data filed at 04/21/2017 0940 Gross per 24 hour  Intake 803 ml  Output -  Net 803 ml    LBM: Last BM Date: 04/20/17 Baseline Weight: Weight: 53.1 kg (117 lb) Most recent weight: Weight: 56.1 kg (123 lb 10.9 oz)     Palliative Assessment/Data:   PPS 30%  Time In: 1300 Time Out: 1420 Time Total: 80 min  Greater than 50%  of this time was spent counseling and coordinating care related to the above assessment and plan.  Signed by: Loistine Chance, MD  [phone]  Please contact Palliative Medicine Team phone at [phone] for questions and concerns.  For individual provider: See Shea Evans

## 2017-04-21 NOTE — Progress Notes (Signed)
    CC:  Bleeding necrotic left chest mass  Subjective: Called by Wound Care, Ann Oliver, for significant bleeding with dressing change.  When the dressing came off so did some of the necrotic tumor.  It was squirting blood from some areas.  We controlled it with pressure.  I attempted to use Silver Nitrate to cauterize one of the sites.  It did not work.  After some time with direct pressure, we reapplied Combat Gauze to the sites bleeding and then reapplied a pressure dressing with 4 x 4's, ABD pads, Kerlix and 6 inch aces.    Objective: Vital signs in last 24 hours: Temp:  [98.3 F (36.8 C)-100.2 F (37.9 C)] 98.4 F (36.9 C) (11/28 0625) Pulse Rate:  [84-108] 84 (11/28 0625) Resp:  [16-17] 16 (11/28 0625) BP: (104-131)/(59-81) 116/71 (11/28 0625) SpO2:  [98 %-100 %] 98 % (11/28 0625) Last BM Date: 04/20/17  Intake/Output from previous day: 11/27 0701 - 11/28 0700 In: 803 [P.O.:480; Blood:323] Out: -  Intake/Output this shift: Total I/O In: 240 [P.O.:240] Out: -   General appearance: alert, cooperative, mild distress and anxious and dizzy Chest wall: no tenderness, Some of the necrotic tumor comes off with dressing and some is literally falling off when I applied pressure to the sites to control bleeding.    Lab Results:  Recent Labs    04/20/17 0535 04/21/17 0615  WBC 10.9* 9.6  HGB 6.3* 9.1*  HCT 19.1* 27.9*  PLT 282 327    BMET Recent Labs    04/20/17 0535 04/21/17 0615  NA 139 138  K 4.2 4.3  CL 105 105  CO2 29 29  GLUCOSE 95 85  BUN 15 13  CREATININE 0.88 0.89  CALCIUM 7.8* 8.1*   PT/INR No results for input(s): LABPROT, INR in the last 72 hours.  Recent Labs  Lab 04/17/17 0500 04/20/17 0535 04/21/17 0615  AST 30 28 34  ALT 22 19 20   ALKPHOS 114 119 145*  BILITOT 0.6 0.4 0.7  PROT 4.6* 4.8* 5.1*  ALBUMIN 1.6* 1.6* 1.8*     Lipase     Component Value Date/Time   LIPASE 29 10/28/2016 1644     Medications: . feeding  supplement (ENSURE ENLIVE)  237 mL Oral TID BM  . gabapentin  600 mg Oral TID  . mirtazapine  15 mg Oral QHS  . multivitamin with minerals  1 tablet Oral Daily  . silver nitrate applicators  2 Stick Topical Once    Assessment/Plan Bleeding from a fungating stage IV left breast cancer mass left lateral chest wall and axilla. Severe anemia secondary to bleeding Ascites/anasarca Malnutrition/deconditioning  FEN:  Regular diet ID:  No antibiotics DVT:  SCD/Anemia Foley: none Follow up:  Oncology   Plan:  Continue pressure dressing and combat gauze.  Dr. Florene Glen was with me when I came to help with the care and he will discuss with Oncology.          LOS: 4 days    Ann Oliver 04/21/2017 [phone]

## 2017-04-22 DIAGNOSIS — Z008 Encounter for other general examination: Secondary | ICD-10-CM

## 2017-04-22 DIAGNOSIS — S21002D Unspecified open wound of left breast, subsequent encounter: Secondary | ICD-10-CM

## 2017-04-22 DIAGNOSIS — R4189 Other symptoms and signs involving cognitive functions and awareness: Secondary | ICD-10-CM

## 2017-04-22 DIAGNOSIS — C50919 Malignant neoplasm of unspecified site of unspecified female breast: Secondary | ICD-10-CM

## 2017-04-22 DIAGNOSIS — D649 Anemia, unspecified: Secondary | ICD-10-CM

## 2017-04-22 LAB — CBC
HCT: 19.1 % — ABNORMAL LOW (ref 36.0–46.0)
Hemoglobin: 6.4 g/dL — CL (ref 12.0–15.0)
MCH: 29 pg (ref 26.0–34.0)
MCHC: 33.5 g/dL (ref 30.0–36.0)
MCV: 86.4 fL (ref 78.0–100.0)
PLATELETS: 302 10*3/uL (ref 150–400)
RBC: 2.21 MIL/uL — AB (ref 3.87–5.11)
RDW: 17 % — ABNORMAL HIGH (ref 11.5–15.5)
WBC: 10.5 10*3/uL (ref 4.0–10.5)

## 2017-04-22 LAB — COMPREHENSIVE METABOLIC PANEL
ALT: 19 U/L (ref 14–54)
ANION GAP: 5 (ref 5–15)
AST: 32 U/L (ref 15–41)
Albumin: 1.7 g/dL — ABNORMAL LOW (ref 3.5–5.0)
Alkaline Phosphatase: 132 U/L — ABNORMAL HIGH (ref 38–126)
BUN: 14 mg/dL (ref 6–20)
CHLORIDE: 105 mmol/L (ref 101–111)
CO2: 28 mmol/L (ref 22–32)
Calcium: 8 mg/dL — ABNORMAL LOW (ref 8.9–10.3)
Creatinine, Ser: 0.77 mg/dL (ref 0.44–1.00)
GFR calc non Af Amer: >60 mL/min (ref >60)
Glucose, Bld: 95 mg/dL (ref 65–99)
Potassium: 4.6 mmol/L (ref 3.5–5.1)
SODIUM: 138 mmol/L (ref 135–145)
Total Bilirubin: 0.4 mg/dL (ref 0.3–1.2)
Total Protein: 4.9 g/dL — ABNORMAL LOW (ref 6.5–8.1)

## 2017-04-22 LAB — PREPARE RBC (CROSSMATCH)

## 2017-04-22 MED ORDER — SODIUM CHLORIDE 0.9 % IV SOLN: Freq: Once | INTRAVENOUS | Status: DC

## 2017-04-22 NOTE — Progress Notes (Signed)
Nutrition Brief Note  Chart reviewed. Pt now transitioning to comfort care.  No further nutrition interventions warranted at this time.   Leevon Upperman, MS, RD, LDN [facility] Clinical Dietitian Pager: [phone] After Hours Pager: [phone]   

## 2017-04-22 NOTE — Progress Notes (Signed)
PMT progress note  Patient is awake, sitting up in bed, she is attempting to eat breakfast. She is asking about the "hospice house"  BP 97/60 (BP Location: Left Arm)   Pulse 94   Temp 99.6 F (37.6 C) (Oral)   Resp 16   Ht 5\' 2"  (1.575 m)   Wt 56.1 kg (123 lb 10.9 oz)   SpO2 100%   BMI 22.62 kg/m  Labs and imaging noted.  Patient has Hgb 6.4 gm/dL, she was given 1 unit PRBC.   Awake alert Tangential in her thought process and her thinking process S1 S2 Has large wound on chest, dressing appears dry and intact No abdominal pain, some abdominal distension Bilateral LE edema Thin extremities  61 yo female with metastatic fungating breast mass, acute on chronic anemia, malnutrition, ascites, anasarca, serum Albumin of 1.7 gm/dL.   PLAN:  Re discussed with patient about her serious incurable illness, recalled her visit with me as well as her visit with Dr [name] yesterday.   Patient still consents to seeking residential hospice and focusing on comfort measures.  She remains pre occupied with living conditions, type of food and accommodations available in a hospice setting.   Re discussed with patient about notifying her next of kin/getting them involved in goals of care discussions. The patient becomes irritated, does not want family involved.   25 minutes spent.    Loistine Chance MD [phone]  [place] palliative medicine team

## 2017-04-22 NOTE — Consult Note (Signed)
[place]: Met with pt who was sitting in her bed at time. She is alert and oriented. She reports not having any dealings with hospice in the past and is receptive to talking with me about hospice services.  She states she was living with "family/friends" prior to this and had a nurse come to house to help with dressings. She asked could we do this. I explained to her what going to our hospice home would look like and that by going there the approach would be palliative and comfort care. I explained that we did not do any type of IVF or IV antibiotics. While speaking to her the nursing staff come into give blood. I asked "Do you know why you are getting the blood and she was able to tell me that she was getting it "because her wound keeps bleeding." She also tells me that "I can not get any more chemo because of this" as well. I then stated well if you decide that the comfort approach with [place] at our hospice home is what you want to to do then "blood would not be an option for you there". She appeared to be surprised by this and just makes a hmm sound with her voice. I then asked her "would you want to continue to get blood transfusions if needed in the future and she said "yes." I explained that we would not be drawing lab work or checking her blood levels if she was to go to hospice home to know if her blood levels were low and that getting blood products are not something that hospice home can do. She then stated "well I don't want hospice care then." I spoke to her about her children which she reports having 5. She states they do know that she is sick. I asked her if she had ever spoke to anyone about a living will or putting in place someone who could make decisions for her if she could not. She refused to talk about this with me and said "I don't think about stuff like that I try to be positive". She reports that she has told the doctor here what her wishes are if her heart stops beating. I  asked "could you tell me what you would want our staff to do at hospice home if your heart stops beating?" She states " I want to be helped if you can help me". I tried to clarify this and asked "do you want [place] to do CPR if this was to happen" and she states "I want to be helped if you can help me".   She appears to be very interested in the services however, we are concerned that she is confused about her prognosis and the severity of her illness. She seems unsure of wanting to pursue full comfort care once discharged to hospice home. I spoke to our medical provider Dr. Beverlyn Roux-- We do agree that she has a short prognosis and would accept the pt is she had goals in line with full comfort care. I spoke to her about hospice care at a nursing facility because her biggest concern is needing help with her  wound dressings. She however would not be a candidate to having blood products under hospice care in a facility either. Please call with any questions or concerns.   Webb Silversmith RN   [phone]

## 2017-04-22 NOTE — Consult Note (Addendum)
Ann Oliver Psychiatry Consult   Reason for Consult:  Capacity evaluation  Referring Physician:  Dr. Cruzita Lederer  Patient Identification: Ann Ann MRN:  [id number] Principal Diagnosis: Evaluation by psychiatric service required Diagnosis:   Patient Active Problem List   Diagnosis Date Noted  . AKI (acute kidney injury) ([place]) [N17.9] 03/26/2017  . Leg swelling [M79.89] 03/26/2017  . Diarrhea [R19.7] 03/26/2017  . Leukocytosis [D72.829] 03/26/2017  . Anemia [D64.9] 03/26/2017  . Acute bleeding [R58] 03/18/2017  . Breast wound [S21.009A] 03/18/2017  . Blood loss anemia [D50.0] 01/18/2017  . Port catheter in place [facility] 12/02/2016  . Necrotizing soft tissue infection [M79.89] 11/21/2016  . Cellulitis [L03.90] 10/28/2016  . Wound infection [T14.8XXA, L08.9] 10/28/2016  . Cellulitis of left breast [N61.0] 10/28/2016  . Encounter for palliative care [Z51.5]   . Goals of care, counseling/discussion [Z71.89]   . 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 Malnutrition of moderate degree [E44.0] 09/25/2016  . Breast cancer of upper-outer quadrant of left female breast ([place]) [C50.412] 10/22/2015  . THYROID NODULE, RIGHT [E04.1] 05/31/2007  . Depressed [F32.9] 05/31/2007  . GERD [K21.9] 05/31/2007  . HOT FLASHES [N95.1] 05/31/2007  . HEADACHE [R51] 05/31/2007    Total Time spent with patient: 1 hour  Subjective:   Ann Oliver is a 61 y.o. female patient admitted with anemia secondary to acute blood loss from fungating breast mass.  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 Hemoglobin was 5.2 on admission which required multiple blood transfusions for persistent anemia. Patient has refused surgery that was recommended. CT's have showed disease progression. She was seen by hospice to discuss residential care but she appeared to have a poor understanding of the  goals of care. She was told that hospice care would be full comfort care although she mentioned treatments that she would desire that hospice was unable to provide. She subsequently refused hospice. Psychiatry was consulted to evaluate capacity to refuse treatment related to metastatic breast cancer.   [name] was seen by the psychiatry consult service on 10/30 for depression and intermittent SI in the setting of pain related to medical condition. She was started on Remeron 15 mg qhs for sleep and reported that she would consider Cymbalta for depression. She is not receiving psychotropic medications during this hospitalization. She was evaluated for capacity to make medical decisions regarding breast cancer treatment in May and was deemed to have capacity.   On interview, [name] is able to state what treatment she would like regarding her breast cancer. She would like to continue receiving blood transfusions if this is needed for blood loss and appropriate medications related to her medical condition. She did not appear to initially understand the concept of comfort care so this was reiterated to her. She was able to state that she does not want comfort care if she will not be able to have the desired treatments mentioned. She was able to sustain a choice regarding treatment and also was able to explain her medical condition.   In regards to mood, she reports "ups and downs" due to ongoing medical problems. She denies SI, HI or AVH. She denies problems with sleep or appetite. She declines psychotropic medications at this time.   Past Psychiatric History: Depression   Risk to Self: Is patient at risk for suicide?: No Risk to Others:  None. Denies HI.  Prior Inpatient Therapy:  Denies  Prior  Outpatient Therapy:  She was treated with Remeron for sleep in October while hospitalized but it was discontinued.   Past Medical History:  Past Medical History:  Diagnosis Date  . Cancer ([place])   .  Sinusitis   . Tumor cells     Past Surgical History:  Procedure Laterality Date  . BIOPSY BREAST    . IR FLUORO GUIDE CV LINE RIGHT  11/06/2016  . IR US GUIDE VASC ACCESS RIGHT  11/06/2016   Family History:  Family History  Problem Relation Age of Onset  . Breast cancer Neg Hx    Family Psychiatric  History: Denies Social History:  Social History   Substance and Sexual Activity  Alcohol Use No     Social History   Substance and Sexual Activity  Drug Use No    Social History   Socioeconomic History  . Marital status: Single    Spouse name: None  . Number of children: None  . Years of education: None  . Highest education level: None  Social Needs  . Financial resource strain: None  . Food insecurity - worry: None  . Food insecurity - inability: None  . Transportation needs - medical: None  . Transportation needs - non-medical: None  Occupational History  . None  Tobacco Use  . Smoking status: Never Smoker  . Smokeless tobacco: Never Used  Substance and Sexual Activity  . Alcohol use: No  . Drug use: No  . Sexual activity: None  Other Topics Concern  . None  Social History Narrative  . None   Additional Social History: She lives at home with family. She denies substance use.    Allergies:   Allergies  Allergen Reactions  . Aspirin Palpitations    Labs:  Results for orders placed or performed during the hospital encounter of 04/16/17 (from the past 48 hour(s))  CBC     Status: Abnormal   Collection Time: 04/21/17  6:15 AM  Result Value Ref Range   WBC 9.6 4.0 - 10.5 K/uL   RBC 3.28 (L) 3.87 - 5.11 MIL/uL   Hemoglobin 9.1 (L) 12.0 - 15.0 g/dL    Comment: DELTA CHECK NOTED POST TRANSFUSION SPECIMEN    HCT 27.9 (L) 36.0 - 46.0 %   MCV 85.1 78.0 - 100.0 fL   MCH 27.7 26.0 - 34.0 pg   MCHC 32.6 30.0 - 36.0 g/dL   RDW 16.5 (H) 11.5 - 15.5 %   Platelets 327 150 - 400 K/uL  Comprehensive metabolic panel     Status: Abnormal   Collection Time:  04/21/17  6:15 AM  Result Value Ref Range   Sodium 138 135 - 145 mmol/L   Potassium 4.3 3.5 - 5.1 mmol/L   Chloride 105 101 - 111 mmol/L   CO2 29 22 - 32 mmol/L   Glucose, Bld 85 65 - 99 mg/dL   BUN 13 6 - 20 mg/dL   Creatinine, Ser 0.89 0.44 - 1.00 mg/dL   Calcium 8.1 (L) 8.9 - 10.3 mg/dL   Total Protein 5.1 (L) 6.5 - 8.1 g/dL   Albumin 1.8 (L) 3.5 - 5.0 g/dL   AST 34 15 - 41 U/L   ALT 20 14 - 54 U/L   Alkaline Phosphatase 145 (H) 38 - 126 U/L   Total Bilirubin 0.7 0.3 - 1.2 mg/dL   GFR calc non Af Amer >60 >60 mL/min   GFR calc Af Amer >60 >60 mL/min    Comment: (NOTE) The eGFR has  been calculated using the CKD EPI equation. This calculation has not been validated in all clinical situations. eGFR's persistently <60 mL/min signify possible Chronic Kidney Disease.    Anion gap 4 (L) 5 - 15  Hemoglobin and hematocrit, blood     Status: Abnormal   Collection Time: 04/21/17  1:16 PM  Result Value Ref Range   Hemoglobin 7.9 (L) 12.0 - 15.0 g/dL   HCT 24.0 (L) 36.0 - 46.0 %  CBC     Status: Abnormal   Collection Time: 04/22/17  4:54 AM  Result Value Ref Range   WBC 10.5 4.0 - 10.5 K/uL   RBC 2.21 (L) 3.87 - 5.11 MIL/uL   Hemoglobin 6.4 (LL) 12.0 - 15.0 g/dL    Comment: REPEATED TO VERIFY CRITICAL RESULT CALLED TO, READ BACK BY AND VERIFIED WITH: B CHERRY RN [phone] 04/22/17 A NAVARRO    HCT 19.1 (L) 36.0 - 46.0 %   MCV 86.4 78.0 - 100.0 fL   MCH 29.0 26.0 - 34.0 pg   MCHC 33.5 30.0 - 36.0 g/dL   RDW 17.0 (H) 11.5 - 15.5 %   Platelets 302 150 - 400 K/uL  Comprehensive metabolic panel     Status: Abnormal   Collection Time: 04/22/17  4:54 AM  Result Value Ref Range   Sodium 138 135 - 145 mmol/L   Potassium 4.6 3.5 - 5.1 mmol/L   Chloride 105 101 - 111 mmol/L   CO2 28 22 - 32 mmol/L   Glucose, Bld 95 65 - 99 mg/dL   BUN 14 6 - 20 mg/dL   Creatinine, Ser 0.77 0.44 - 1.00 mg/dL   Calcium 8.0 (L) 8.9 - 10.3 mg/dL   Total Protein 4.9 (L) 6.5 - 8.1 g/dL   Albumin 1.7 (L) 3.5  - 5.0 g/dL   AST 32 15 - 41 U/L   ALT 19 14 - 54 U/L   Alkaline Phosphatase 132 (H) 38 - 126 U/L   Total Bilirubin 0.4 0.3 - 1.2 mg/dL   GFR calc non Af Amer >60 >60 mL/min   GFR calc Af Amer >60 >60 mL/min    Comment: (NOTE) The eGFR has been calculated using the CKD EPI equation. This calculation has not been validated in all clinical situations. eGFR's persistently <60 mL/min signify possible Chronic Kidney Disease.    Anion gap 5 5 - 15  Prepare RBC     Status: None   Collection Time: 04/22/17  6:00 AM  Result Value Ref Range   Order Confirmation ORDER PROCESSED BY BLOOD BANK     Current Facility-Administered Medications  Medication Dose Route Frequency Provider Last Rate Last Dose  . 0.9 %  sodium chloride infusion   Intravenous Once Kirby-Graham, Karsten Fells, NP      . acetaminophen (TYLENOL) tablet 650 mg  650 mg Oral Q6H PRN Elodia Florence., MD   650 mg at 04/22/17 1254  . feeding supplement (ENSURE ENLIVE) (ENSURE ENLIVE) liquid 237 mL  237 mL Oral TID BM Oswald Hillock, MD   237 mL at 04/22/17 1400  . gabapentin (NEURONTIN) capsule 600 mg  600 mg Oral TID Oswald Hillock, MD   600 mg at 04/22/17 1538  . HYDROmorphone (DILAUDID) injection 1 mg  1 mg Intravenous Q4H PRN Nicholas Lose, MD   1 mg at 04/22/17 1539  . iopamidol (ISOVUE-300) 61 % injection 15 mL  15 mL Oral BID PRN Elodia Florence., MD   15 mL at 04/20/17  1453  . mirtazapine (REMERON SOL-TAB) disintegrating tablet 15 mg  15 mg Oral QHS Oswald Hillock, MD   15 mg at 04/21/17 2147  . multivitamin with minerals tablet 1 tablet  1 tablet Oral Daily Caren Griffins, MD   1 tablet at 04/22/17 1027  . ondansetron (ZOFRAN) tablet 4 mg  4 mg Oral Q6H PRN Oswald Hillock, MD   4 mg at 04/21/17 1839   Or  . ondansetron [facility]) injection 4 mg  4 mg Intravenous Q6H PRN Oswald Hillock, MD   4 mg at 04/17/17 2002  . oxyCODONE (Oxy IR/ROXICODONE) immediate release tablet 5-10 mg  5-10 mg Oral Q4H PRN Elodia Florence.,  MD   10 mg at 04/22/17 1439  . polyethylene glycol (MIRALAX / GLYCOLAX) packet 17 g  17 g Oral BID Elodia Florence., MD   17 g at 04/22/17 1027  . silver nitrate applicators applicator 2 Stick  2 Stick Topical Once Earnstine Regal, PA-C      . sodium chloride flush (NS) 0.9 % injection 10-40 mL  10-40 mL Intracatheter PRN Dorie Rank, MD   10 mL at 04/20/17 1137   Facility-Administered Medications Ordered in Other Encounters  Medication Dose Route Frequency Provider Last Rate Last Dose  . sodium chloride flush (NS) 0.9 % injection 10 mL  10 mL Intracatheter PRN Nicholas Lose, MD   10 mL at 12/25/16 1702    Musculoskeletal: Strength & Muscle Tone: within normal limits Gait & Station: UTA since patient was lying in bed. Patient leans: N/A  Psychiatric Specialty Exam: Physical Exam  Nursing note and vitals reviewed. Constitutional: She is oriented to person, place, and time. She appears well-developed.  HENT:  Head: Normocephalic and atraumatic.  Neck: Normal range of motion.  Respiratory: Effort normal.  Musculoskeletal: Normal range of motion.  Neurological: She is alert and oriented to person, place, and time.  Skin: No rash noted.  Psychiatric: Her speech is normal and behavior is normal. Thought content normal. Cognition and memory are normal.    Review of Systems  Constitutional: Negative for chills and fever.  Eyes: Negative for blurred vision.  Gastrointestinal: Negative for abdominal pain, constipation, diarrhea, nausea and vomiting.  Musculoskeletal:       Left ventral forearm pain.  Psychiatric/Behavioral: Positive for depression. Negative for substance abuse and suicidal ideas. The patient is nervous/anxious. The patient does not have insomnia.     Blood pressure 110/63, pulse 100, temperature 99.5 F (37.5 C), temperature source Oral, resp. rate 18, height 5' 2"  (1.575 m), weight 56.1 kg (123 lb 10.9 oz), SpO2 100 %.Body mass index is 22.62 kg/m.  General  Appearance: Well Groomed, thin, African American female with head scarf and hospital gown who is lying in bed. NAD.   Eye Contact:  Fair  Speech:  Clear and Coherent and Normal Rate  Volume:  Normal  Mood:  "Ups and downs"  Affect:  Constricted  Thought Process:  Goal Directed and Linear  Orientation:  Full (Time, Place, and Person)  Thought Content:  Logical  Suicidal Thoughts:  No  Homicidal Thoughts:  No  Memory:  Immediate;   Fair Recent;   Fair Remote;   Fair  Judgement:  Fair  Insight:  Fair  Psychomotor Activity:  Normal  Concentration:  Concentration: Good and Attention Span: Good  Recall:  Good  Fund of Knowledge:  Fair  Language:  Fair  Akathisia:  No  Handed:  Right  AIMS (  if indicated):   N/A  Assets:  Desire for Improvement Housing  ADL's:  Intact  Cognition:  WNL  Sleep:   Okay   Assessment: Imaya Duffy is a 61 y.o. female who was admitted with anemia secondary to acute blood loss from fungating breast mass. She demonstrates capacity to refuse comfort care at this time since she is able to express a choice and sustain a choice. She was reinformed about the goals of treatment for comfort care. She is able to state that these are not her goals so she therefore is unwilling to accept treatment with comfort care. She appears to have poor healthcare literacy and may need to have information explained to her more than once but at this time she appears to demonstrate understanding and is able to sustain a choice regarding her care.    Treatment Plan Summary: -Patient demonstrates capacity to refuse comfort care at this time. Please see assessment for further details. -Psychiatry will sign off patient at this time. Please consult psychiatry again as needed.   Disposition: No evidence of imminent risk to self or others at present.   Patient does not meet criteria for psychiatric inpatient admission.  Faythe Dingwall, DO 04/22/2017 3:42 PM

## 2017-04-22 NOTE — Progress Notes (Signed)
CSW following to assist with disposition. Referred pt to residential hospice, however when she met with liaison, there was concern that she was no longer understanding or consenting to hospice services. Discussed with palliative team, and as pt has exhibited some fluctuating understanding and requests no family involvement, consult for capacity to make medical decisions would be appreciated.  Discussed with attending as well. Will follow.  Sharren Bridge, MSW, LCSW Clinical Social Work 04/22/2017 [phone]

## 2017-04-22 NOTE — Progress Notes (Signed)
On call notified of HGB of 6.4

## 2017-04-22 NOTE — Progress Notes (Signed)
PROGRESS NOTE  Ann Oliver LZJ:[id number] DOB: August 22, 1955 DOA: 04/16/2017 PCP: [facility]   LOS: 5 days   Brief Narrative / Interim history: Ann Oliver  is a 61 y.o. female,.. 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  Patient was seen by her home health nurse who instructed her to come to ED for further evaluation.  Patient denies fevers or chills. 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 -Staging CT's ordered by [name] with "overall disease progression" (see report)"  - [name] saw her 11/28 and discussed her poor prognosis.  She discussed with palliative care and is now DNR/DNI.   -Discussions were for patient to go to residential hospice, however when hospice representative discussed with the patient today on 11/29 seems to have poor understanding of what this actually means.  She is now refusing hospice. -I have consulted psychiatry to evaluate patient for underlying psychiatric issues and whether she has capacity to make decisions  Ascites/anasarca -patient'salbumin was 2.2 on 11-18, she has bilateral lower  extremity edema as well as abdominal distention most likely from ascites.Concernfor malignant ascites. -Initially planned paracentesis, but not going to change management at this point.  Can pursue this for comfort if needed.  Hypoalbuminemia/malnutrition  Leukocytosis -improved. No fevers (elevated temp yesterday, but no true fever).  Continue to monitor.  DVT prophylaxis: SCD Code Status: Full code Family Communication: no family at bedside Disposition Plan: To be determined  Consultants:   General surgery  Palliative care  Procedures:   None   Antimicrobials:  None    Subjective: -No complaints, sitting up in bed, asking questions about hospice facility  Objective: Vitals:   04/22/17 0520 04/22/17 1056 04/22/17 1111 04/22/17 1442  BP: 97/60 (!) 125/96 (!) 141/70 110/63  Pulse: 94 66 100 100  Resp: 16 18 18 18   Temp: 99.6 F (37.6 C) 98.8 F (37.1 C) 98.6 F (37 C) 99.5 F (37.5 C)  TempSrc: Oral Oral Oral Oral  SpO2: 100% 99% 99% 100%  Weight:      Height:        Intake/Output Summary (Last 24 hours) at 04/22/2017 1518 Last data filed at 04/22/2017 1356 Gross per 24 hour  Intake 380 ml  Output -  Net 380 ml   Filed Weights   04/16/17 1726 04/16/17 2154  Weight: 53.1 kg (117 lb) 56.1 kg (123 lb 10.9 oz)    Examination:  Constitutional: NAD Eyes: lids and conjunctivae normal Neck: normal, supple Respiratory: clear to auscultation bilaterally, no wheezing, no crackles. Normal respiratory effort.  Cardiovascular: Regular rate and rhythm, no murmurs / rubs / gallops. Abdomen: no tenderness. Bowel sounds positive.  Skin: no rashes, lesions, ulcers. No induration.  Pressure wrap around her left breast Neurologic: non focal    Data Reviewed: I have independently reviewed following labs and imaging studies   CBC: Recent Labs  Lab 04/18/17 0353 04/19/17 0529 04/20/17 0535 04/21/17 0615 04/21/17 1316 04/22/17 0454  WBC 12.0* 12.6* 10.9*  9.6  --  10.5  HGB 8.7* 8.4* 6.3* 9.1* 7.9* 6.4*  HCT 26.1* 25.8* 19.1* 27.9* 24.0* 19.1*  MCV 83.9 85.1 86.0 85.1  --  86.4  PLT 288 306 282 327  --  063   Basic Metabolic Panel: Recent Labs  Lab 04/17/17 0500 04/19/17 0529 04/20/17 0535 04/21/17 0615 04/22/17 0454  NA 137 137 139 138 138  K 4.2 4.5 4.2 4.3 4.6  CL 104 102 105 105 105  CO2 28 29 29 29 28   GLUCOSE 77 89 95 85 95  BUN 10 12 15 13 14   CREATININE 0.71 0.96 0.88 0.89 0.77  CALCIUM 7.7* 8.0* 7.8* 8.1* 8.0*   GFR: Estimated Creatinine Clearance: 57.7 mL/min (by C-G formula based on SCr of 0.77 mg/dL). Liver Function Tests: Recent Labs  Lab 04/17/17 0500 04/20/17 0535 04/21/17 0615 04/22/17 0454  AST 30 28 34 32  ALT 22 19 20 19   ALKPHOS 114 119 145* 132*  BILITOT 0.6 0.4 0.7 0.4  PROT 4.6* 4.8* 5.1* 4.9*  ALBUMIN 1.6* 1.6* 1.8* 1.7*   No results for input(s): LIPASE, AMYLASE in the last 168 hours. No results for input(s): AMMONIA in the last 168 hours. Coagulation Profile: No results for input(s): INR, PROTIME in the last 168 hours. Cardiac Enzymes: No results for input(s): CKTOTAL, CKMB, CKMBINDEX, TROPONINI in the last 168 hours. BNP (last 3 results) No results for input(s): PROBNP in the last 8760 hours. HbA1C: No results for input(s): HGBA1C in the last 72 hours. CBG: No results for input(s): GLUCAP in the last 168 hours. Lipid Profile: No results for input(s): CHOL, HDL, LDLCALC, TRIG, CHOLHDL, LDLDIRECT in the last 72 hours. Thyroid Function Tests: No results for input(s): TSH, T4TOTAL, FREET4, T3FREE, THYROIDAB in the last 72 hours. Anemia Panel: No results for input(s): VITAMINB12, FOLATE, FERRITIN, TIBC, IRON, RETICCTPCT in the last 72 hours. Urine analysis:    Component Value Date/Time   COLORURINE YELLOW 03/27/2017 1459   APPEARANCEUR CLEAR 03/27/2017 1459   LABSPEC 1.012 03/27/2017 1459   PHURINE 5.0 03/27/2017 1459   GLUCOSEU NEGATIVE 03/27/2017 1459   HGBUR NEGATIVE 03/27/2017  1459   BILIRUBINUR NEGATIVE 03/27/2017 1459   KETONESUR 5 (A) 03/27/2017 1459   PROTEINUR NEGATIVE 03/27/2017 1459   NITRITE NEGATIVE 03/27/2017 1459   LEUKOCYTESUR TRACE (A) 03/27/2017 1459   Sepsis Labs: Invalid input(s): PROCALCITONIN, LACTICIDVEN  No results found for this or any previous visit (from the past 240 hour(s)).    Radiology Studies: Ct Chest W Contrast  Result Date: 04/21/2017 CLINICAL DATA:  Breast cancer. Restaging. Non osseous recurrent suspected. EXAM: CT CHEST, ABDOMEN, AND PELVIS WITH CONTRAST TECHNIQUE: Multidetector CT imaging of the chest, abdomen and pelvis was performed following the standard protocol during bolus administration of intravenous contrast. CONTRAST:  171mL ISOVUE-300 IOPAMIDOL (ISOVUE-300) INJECTION 61%, 107mL ISOVUE-300 IOPAMIDOL (ISOVUE-300) INJECTION 61% COMPARISON:  Plain film 03/18/2017.  10/28/2016 CT. FINDINGS: CT CHEST FINDINGS Cardiovascular: Aortic atherosclerosis. Mild cardiomegaly, without pericardial effusion. No central pulmonary embolism, on this non-dedicated study. Right-sided PICC line which terminates at the high right atrium. Mediastinum/Nodes: Left supraclavicular node of 9 mm on image 8/series 2 is similar. Well-circumscribed hypoattenuating right thyroid nodule is grossly similar and of doubtful clinical  significance. left axillary nodes are decreased, with an index measuring 1.0 cm today versus 2.1 cm on the prior exam (when remeasured). A left subpectoral node measures 11 mm today versus 9 mm on the prior exam (when remeasured). No mediastinal or hilar adenopathy. Lungs/Pleura: Trace bilateral pleural fluid, new. Bilateral pulmonary nodules consistent with metastasis. Index left upper lobe pulmonary nodule measures 1.4 cm on image 46/ series 4 versus 1.1 cm on the prior. An index right lower lobe pulmonary nodule measures 9 mm on image 88/series 4 versus 8 mm on the prior exam (when remeasured). Inferior lingular nodule measures 1.8 cm  on image 84/ series 4 and is similar 1.9 cm on the prior. Subpleural left upper lobe 1.3 cm nodule on image 67/series 4 is new. Musculoskeletal: Left chest wall mass measures 15.2 x 6.2 by 17.2 cm today versus 11.4 x 8.3 cm by 14.7 on the prior exam (when remeasured). A mild compression deformity involving the superior endplate of T03 is not significantly changed. CT ABDOMEN PELVIS FINDINGS Hepatobiliary: Segment 2 hypoattenuating lesion measures 11 mm on image 46/ series 2 versus 16 mm on the prior. No new liver lesion. Normal gallbladder. Upper normal common duct size, 7 mm. No obstructive cause identified. Pancreas: Normal, without mass or ductal dilatation. Spleen: Normal in size, without focal abnormality. Adrenals/Urinary Tract: Normal adrenal glands. 2.1 cm right renal cyst. Normal left kidney. No hydronephrosis. Normal urinary bladder. Stomach/Bowel: Normal stomach, without wall thickening. Colonic stool burden suggests constipation. Normal small bowel. Vascular/Lymphatic: Normal caliber of the aorta and branch vessels. Necrotic porta hepatis node of 1.9 x 2.5 cm on image 60/series 2. Similar to minimally enlarged from 1.8 x 2.2 on the prior exam. No pelvic sidewall adenopathy. Reproductive: Suspect a small uterine fundal fibroid on image 102/series 2. No adnexal mass. Other: Mild pelvic floor laxity. No significant free fluid. Anasarca. Musculoskeletal: Probable degenerative sclerosis about the left sacroiliac joint, similar. IMPRESSION: 1. Mixed response, with overall disease progression. 2. Enlargement of left chest wall mass with improvement in left axillary but slight increase in left subpectoral adenopathy. Similar left supraclavicular adenopathy. 3. Progression of pulmonary metastasis. 4. Improvement in isolated hepatic metastasis with enlargement of necrotic periportal adenopathy. 5. Small bilateral pleural effusions. 6.  Possible constipation. Electronically Signed   By: Abigail Miyamoto M.D.   On:  04/21/2017 10:04   Ct Abdomen Pelvis W Contrast  Result Date: 04/21/2017 CLINICAL DATA:  Breast cancer. Restaging. Non osseous recurrent suspected. EXAM: CT CHEST, ABDOMEN, AND PELVIS WITH CONTRAST TECHNIQUE: Multidetector CT imaging of the chest, abdomen and pelvis was performed following the standard protocol during bolus administration of intravenous contrast. CONTRAST:  132mL ISOVUE-300 IOPAMIDOL (ISOVUE-300) INJECTION 61%, 24mL ISOVUE-300 IOPAMIDOL (ISOVUE-300) INJECTION 61% COMPARISON:  Plain film 03/18/2017.  10/28/2016 CT. FINDINGS: CT CHEST FINDINGS Cardiovascular: Aortic atherosclerosis. Mild cardiomegaly, without pericardial effusion. No central pulmonary embolism, on this non-dedicated study. Right-sided PICC line which terminates at the high right atrium. Mediastinum/Nodes: Left supraclavicular node of 9 mm on image 8/series 2 is similar. Well-circumscribed hypoattenuating right thyroid nodule is grossly similar and of doubtful clinical significance. left axillary nodes are decreased, with an index measuring 1.0 cm today versus 2.1 cm on the prior exam (when remeasured). A left subpectoral node measures 11 mm today versus 9 mm on the prior exam (when remeasured). No mediastinal or hilar adenopathy. Lungs/Pleura: Trace bilateral pleural fluid, new. Bilateral pulmonary nodules consistent with metastasis. Index left upper lobe pulmonary nodule measures 1.4 cm on image 46/ series 4  versus 1.1 cm on the prior. An index right lower lobe pulmonary nodule measures 9 mm on image 88/series 4 versus 8 mm on the prior exam (when remeasured). Inferior lingular nodule measures 1.8 cm on image 84/ series 4 and is similar 1.9 cm on the prior. Subpleural left upper lobe 1.3 cm nodule on image 67/series 4 is new. Musculoskeletal: Left chest wall mass measures 15.2 x 6.2 by 17.2 cm today versus 11.4 x 8.3 cm by 14.7 on the prior exam (when remeasured). A mild compression deformity involving the superior endplate of  N23 is not significantly changed. CT ABDOMEN PELVIS FINDINGS Hepatobiliary: Segment 2 hypoattenuating lesion measures 11 mm on image 46/ series 2 versus 16 mm on the prior. No new liver lesion. Normal gallbladder. Upper normal common duct size, 7 mm. No obstructive cause identified. Pancreas: Normal, without mass or ductal dilatation. Spleen: Normal in size, without focal abnormality. Adrenals/Urinary Tract: Normal adrenal glands. 2.1 cm right renal cyst. Normal left kidney. No hydronephrosis. Normal urinary bladder. Stomach/Bowel: Normal stomach, without wall thickening. Colonic stool burden suggests constipation. Normal small bowel. Vascular/Lymphatic: Normal caliber of the aorta and branch vessels. Necrotic porta hepatis node of 1.9 x 2.5 cm on image 60/series 2. Similar to minimally enlarged from 1.8 x 2.2 on the prior exam. No pelvic sidewall adenopathy. Reproductive: Suspect a small uterine fundal fibroid on image 102/series 2. No adnexal mass. Other: Mild pelvic floor laxity. No significant free fluid. Anasarca. Musculoskeletal: Probable degenerative sclerosis about the left sacroiliac joint, similar. IMPRESSION: 1. Mixed response, with overall disease progression. 2. Enlargement of left chest wall mass with improvement in left axillary but slight increase in left subpectoral adenopathy. Similar left supraclavicular adenopathy. 3. Progression of pulmonary metastasis. 4. Improvement in isolated hepatic metastasis with enlargement of necrotic periportal adenopathy. 5. Small bilateral pleural effusions. 6.  Possible constipation. Electronically Signed   By: Abigail Miyamoto M.D.   On: 04/21/2017 10:04     Scheduled Meds: . feeding supplement (ENSURE ENLIVE)  237 mL Oral TID BM  . gabapentin  600 mg Oral TID  . mirtazapine  15 mg Oral QHS  . multivitamin with minerals  1 tablet Oral Daily  . polyethylene glycol  17 g Oral BID  . silver nitrate applicators  2 Stick Topical Once   Continuous Infusions: .  sodium chloride       Marzetta Board, MD, PhD [facility] Pager [phone]  If 7PM-7AM, please contact night-coverage [email or web address] Password [email or web address] 04/22/2017, 3:18 PM

## 2017-04-23 DIAGNOSIS — C787 Secondary malignant neoplasm of liver and intrahepatic bile duct: Secondary | ICD-10-CM

## 2017-04-23 DIAGNOSIS — R180 Malignant ascites: Secondary | ICD-10-CM

## 2017-04-23 DIAGNOSIS — R188 Other ascites: Secondary | ICD-10-CM

## 2017-04-23 DIAGNOSIS — C773 Secondary and unspecified malignant neoplasm of axilla and upper limb lymph nodes: Secondary | ICD-10-CM

## 2017-04-23 DIAGNOSIS — Z7189 Other specified counseling: Secondary | ICD-10-CM

## 2017-04-23 LAB — BPAM RBC
Blood Product Expiration Date: 201812102359
Blood Product Expiration Date: 201812192359
Blood Product Expiration Date: 201812192359
ISSUE DATE / TIME: 201811271517
ISSUE DATE / TIME: 201811272025
ISSUE DATE / TIME: 201811291042
UNIT TYPE AND RH: 6200
UNIT TYPE AND RH: 6200
Unit Type and Rh: 6200

## 2017-04-23 LAB — BASIC METABOLIC PANEL
Anion gap: 6 (ref 5–15)
BUN: 13 mg/dL (ref 6–20)
CALCIUM: 8.1 mg/dL — AB (ref 8.9–10.3)
CO2: 27 mmol/L (ref 22–32)
CREATININE: 0.86 mg/dL (ref 0.44–1.00)
Chloride: 102 mmol/L (ref 101–111)
GFR calc non Af Amer: >60 mL/min (ref >60)
GLUCOSE: 99 mg/dL (ref 65–99)
Potassium: 4.5 mmol/L (ref 3.5–5.1)
Sodium: 135 mmol/L (ref 135–145)

## 2017-04-23 LAB — CBC
HCT: 23.4 % — ABNORMAL LOW (ref 36.0–46.0)
Hemoglobin: 7.7 g/dL — ABNORMAL LOW (ref 12.0–15.0)
MCH: 28.1 pg (ref 26.0–34.0)
MCHC: 32.9 g/dL (ref 30.0–36.0)
MCV: 85.4 fL (ref 78.0–100.0)
PLATELETS: 386 10*3/uL (ref 150–400)
RBC: 2.74 MIL/uL — AB (ref 3.87–5.11)
RDW: 17.2 % — AB (ref 11.5–15.5)
WBC: 9.6 10*3/uL (ref 4.0–10.5)

## 2017-04-23 LAB — TYPE AND SCREEN
ABO/RH(D): A POS
ANTIBODY SCREEN: NEGATIVE
UNIT DIVISION: 0
UNIT DIVISION: 0
UNIT DIVISION: 0

## 2017-04-23 NOTE — Progress Notes (Signed)
PMT progress note  Patient is awake, sitting up in bed, she is attempting to eat lunch.   when asked how her dressing change went today, she says, "better than yesterday, the bleeding is less."  BP 115/69 (BP Location: Left Arm)   Pulse 96   Temp 99 F (37.2 C) (Oral)   Resp 14   Ht 5\' 2"  (1.575 m)   Wt 56.1 kg (123 lb 10.9 oz)   SpO2 100%   BMI 22.62 kg/m  Labs and imaging noted.     Awake alert Tangential in her thought process and her thinking process S1 S2 Has large wound dressing on chest, dressing appears dry and intact No abdominal pain, some abdominal distension Bilateral LE edema Thin extremities  61 yo female with metastatic fungating breast mass, acute on chronic anemia, malnutrition, ascites, anasarca, serum Albumin of 1.7 gm/dL.   PLAN:  Re discussed with patient about events in the past 24 hours. She has been seen by psych, deemed to have capacity to make her won decisions, refused comfort measures, refused residential hospice as she stated that her goals were to continue to have treatments ongoing and not comfort care.   Appreciate Dr Geralyn Flash input and recommendations.   Call placed and discussed with CSW today. May be consider [facility] for complex wound care management on discharge this time.   Discussed again with the patient in detail. I am not sure to what extent she fully understands that what she has is serious and incurable and progressive. She remains fixated on living accommodations, whether there will be single or double occupancy rooms in the nursing home, what type of food will be offered etc.   How ever, it does not appear that her goals are directly consistent with hospice philosophy of care at this time, as she does want to come back to the hospital, wants ongoing wound care and dressing changes and blood transfusions.    25 minutes spent.    Loistine Chance MD [phone]  [place] palliative medicine  team

## 2017-04-23 NOTE — Progress Notes (Addendum)
Per psychiatric evaluation the patient has capacity to make her own decisions.  Patient declines Residential Hospice. Patient is agreeable to Nursing Home placement to continue to care.  CSW faxed patient clinical information to SNF, waiting for offers.   Patient does not have SNF offers at this time. Per Palliative note, the patient may consider [facility] for completed wound care on Case Management.  CSW discussed with RNCM, the patient has medicaid and will not qualify LTAC.  CSW discussed with Leadership and provided list of facilities inquire.  CSW will continue to follow for needs.    Kathrin Greathouse, Latanya Presser, MSW Clinical Social Worker  [phone] 04/23/2017  9:50 AM

## 2017-04-23 NOTE — Consult Note (Signed)
[facility] Nurse wound follow up Wound type:Left breast mass dressing change. Dressing taken down after saturating with NS.  Still, massive bleed when combat gauze layer removed, with spraying and pulsating bleeders. Will Haynes Kerns and I are assisted by the patient's bedside RN who assisted [place] with obtaining supplies and linen and she is appreciated. Measurement:per Monday Wound bed:Per Monday, pieces of tumor are breaking off with dressing removal Drainage (amount, consistency, odor) Pungent odor consistent with necrotic tissue and blood. Periwound:Intact, dry Dressing procedure/placement/frequency:2 packages of Combat gauze cut into strips and applied to bleeding areas and entire tumor.  Towel placed over combat gauze to hold pressure and achieve homeostasis. Dry gauze toppers, 3 ABDs, 3 Kerlix roll gauzes are used to secure ABDs. Two, 6 inch ACE wraps are used for compression.  Patient is bathed and bed linens changed. Noted is the input from Psychiatry and Hospice. I do not believe that this patient's needs can be met in a SNF,  [place] Nurse will follow and plan to see on Monday. The assistance of CCS is appreciated and essential to these dressing changes.  [facility] nursing team will follow, and will remain available to this patient, the nursing, surgical and medical teams.  Thanks, Maudie Flakes, MSN, RN, [place], Arther Abbott  Pager# [phone]

## 2017-04-23 NOTE — Progress Notes (Signed)
HEMATOLOGY-ONCOLOGY PROGRESS NOTE  SUBJECTIVE: Patient sitting up and having lunch. Bleeding from the tumor held in check  OBJECTIVE: REVIEW OF SYSTEMS:   All other systems were reviewed with the patient and are negative.  I have reviewed the past medical history, past surgical history, social history and family history with the patient and they are unchanged from previous note.   PHYSICAL EXAMINATION: ECOG PERFORMANCE STATUS: 3 - Symptomatic, >50% confined to bed  Vitals:   04/22/17 2057 04/23/17 0515  BP: 103/73 115/69  Pulse: (!) 103 96  Resp: 16 14  Temp: 99.8 F (37.7 C) 99 F (37.2 C)  SpO2: 100% 100%   Filed Weights   04/16/17 1726 04/16/17 2154  Weight: 117 lb (53.1 kg) 123 lb 10.9 oz (56.1 kg)    GENERAL:alert, no distress and comfortable SKIN: skin color, texture, turgor are normal, no rashes or significant lesions EYES: normal, Conjunctiva are pink and non-injected, sclera clear OROPHARYNX:no exudate, no erythema and lips, buccal mucosa, and tongue normal  NECK: supple, thyroid normal size, non-tender, without nodularity LYMPH:  no palpable lymphadenopathy in the cervical, axillary or inguinal LUNGS: clear to auscultation and percussion with normal breathing effort HEART: regular rate & rhythm and no murmurs and no lower extremity edema ABDOMEN:abdomen soft, non-tender and normal bowel sounds Musculoskeletal:no cyanosis of digits and no clubbing  NEURO: alert & oriented x 3 with fluent speech, no focal motor/sensory deficits  LABORATORY DATA:  I have reviewed the data as listed CMP Latest Ref Rng & Units 04/23/2017 04/22/2017 04/21/2017  Glucose 65 - 99 mg/dL 99 95 85  BUN 6 - 20 mg/dL 13 14 13   Creatinine 0.44 - 1.00 mg/dL 0.86 0.77 0.89  Sodium 135 - 145 mmol/L 135 138 138  Potassium 3.5 - 5.1 mmol/L 4.5 4.6 4.3  Chloride 101 - 111 mmol/L 102 105 105  CO2 22 - 32 mmol/L 27 28 29   Calcium 8.9 - 10.3 mg/dL 8.1(L) 8.0(L) 8.1(L)  Total Protein 6.5 - 8.1  g/dL - 4.9(L) 5.1(L)  Total Bilirubin 0.3 - 1.2 mg/dL - 0.4 0.7  Alkaline Phos 38 - 126 U/L - 132(H) 145(H)  AST 15 - 41 U/L - 32 34  ALT 14 - 54 U/L - 19 20    Lab Results  Component Value Date   WBC 9.6 04/23/2017   HGB 7.7 (L) 04/23/2017   HCT 23.4 (L) 04/23/2017   MCV 85.4 04/23/2017   PLT 386 04/23/2017   NEUTROABS 14.9 (H) 04/02/2017    ASSESSMENT AND PLAN: 1. Metastatic Breast cancer: I recommended hospice and patient agreed. However she was told by someone that Medicaid doesn't pay for hospice and that she needs to go to NH. I am not sure that's what was told to her but that's what she heard. Shes willing to go to hospice if Medicaid does cover it.  2. We talked about comfort care measures. Blood transfusion may in rare situations be considered if shes very symptomatic and needs it for comfort. I informed her that we dont routinely do blood transfusions in hospice situation.  Hope we can find a place that's appropriate for her complex social and psychological needs

## 2017-04-23 NOTE — Progress Notes (Signed)
PROGRESS NOTE    Ann Oliver  IOM:[id number] DOB: 03/12/1956 DOA: 04/16/2017 PCP: [facility], [facility]   Brief Narrative: 61 y.o.female with history of metastatic fungating breast mass currently on palliative chemotherapy, chronic anemia, malnutrition, depression, with bilateral lower extremity edema who came to ED for worsening bleeding from breast mass. Patient was seen by her home health nurse who instructed her to come to ED for further evaluation. Patient denies fevers or chills. She was found to have a hemoglobin of 5.2  Assessment & Plan:  #Acute blood loss anemia: Received red blood cell transfusion.  Hemoglobin is stable.  General surgery consulted, continue supportive care.  #Bleeding from fungating breast mass: Patient with poor prognosis.  Recommended hospice care. Palliative care and oncology consult appreciated.  Psychiatrist evaluated the patient and deemed to have capacity to make clinical decision.  I had a prolonged discussion with the patient about current clinical condition and plan of care.  She understand and clearly declined hospice care.  She likes to continue current care including wound care and receiving blood transfusion.  I discussed with the social worker regarding finding a skilled nursing facility or rehab facility for safe discharge plan.  Wound care will be a big challenging issue.  Unknown if patient qualifies to go to LTAC. -Wound care consult appreciated.  #Anasarca/ascites: Concerning for malignant ascites.  Continue supportive care. 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  Difficult discharge planning    Consultants:   Oncology  Palliative care  Surgery  Procedures:Wound care Antimicrobials: None  Subjective: Seen and examined at bedside.  Denies headache, dizziness, nausea vomiting chest pain shortness of breath.  Refused hospice or comfort care.  Objective: Vitals:   04/22/17 1430 04/22/17 1442 04/22/17 2057 04/23/17 0515  BP: 110/60 110/63 103/73 115/69  Pulse: 100 100 (!) 103 96  Resp: 18 18 16 14   Temp: 98.9 F (37.2 C) 99.5 F (37.5 C) 99.8 F (37.7 C) 99 F (37.2 C)  TempSrc: Oral Oral Oral Oral  SpO2: 100% 100% 100% 100%  Weight:      Height:        Intake/Output Summary (Last 24 hours) at 04/23/2017 1400 Last data filed at 04/23/2017 0602 Gross per 24 hour  Intake 315 ml  Output 500 ml  Net -185 ml   Filed Weights   04/16/17 1726 04/16/17 2154  Weight: 53.1 kg (117 lb) 56.1 kg (123 lb 10.9 oz)    Examination:  General exam: Ill looking female lying on bed Respiratory system: Lungs clear, no wheezing.  Patient has chest binders applied Cardiovascular system: S1 & S2 heard, RRR.  No pedal edema. Gastrointestinal system: Abdomen is nondistended, soft and nontender. Normal bowel sounds heard. Central nervous system: Alert and oriented. No focal neurological deficits. Skin: No rashes, lesions or ulcers Psychiatry: Judgement and insight appear normal. Mood & affect appropriate.     Data Reviewed: I have personally reviewed following labs and imaging studies  CBC: Recent Labs  Lab 04/19/17 0529 04/20/17 0535 04/21/17 0615 04/21/17 1316 04/22/17 0454 04/23/17 0423  WBC 12.6* 10.9*  9.6  --  10.5 9.6  HGB 8.4* 6.3* 9.1* 7.9* 6.4* 7.7*  HCT 25.8* 19.1* 27.9* 24.0* 19.1* 23.4*  MCV 85.1 86.0 85.1  --  86.4 85.4  PLT 306 282 327  --  302 287   Basic Metabolic Panel: Recent Labs  Lab 04/19/17 0529 04/20/17 0535 04/21/17 0615  04/22/17 0454 04/23/17 0423  NA 137 139 138 138 135  K 4.5 4.2 4.3 4.6 4.5  CL 102 105 105 105 102  CO2 29 29 29 28 27   GLUCOSE 89 95 85 95 99  BUN 12 15 13 14 13   CREATININE 0.96 0.88 0.89 0.77 0.86  CALCIUM 8.0* 7.8* 8.1* 8.0* 8.1*   GFR: Estimated Creatinine Clearance: 53.6 mL/min (by C-G formula based on SCr of 0.86 mg/dL). Liver Function Tests: Recent Labs  Lab 04/17/17 0500 04/20/17 0535 04/21/17 0615 04/22/17 0454  AST 30 28 34 32  ALT 22 19 20 19   ALKPHOS 114 119 145* 132*  BILITOT 0.6 0.4 0.7 0.4  PROT 4.6* 4.8* 5.1* 4.9*  ALBUMIN 1.6* 1.6* 1.8* 1.7*   No results for input(s): LIPASE, AMYLASE in the last 168 hours. No results for input(s): AMMONIA in the last 168 hours. Coagulation Profile: No results for input(s): INR, PROTIME in the last 168 hours. Cardiac Enzymes: No results for input(s): CKTOTAL, CKMB, CKMBINDEX, TROPONINI in the last 168 hours. BNP (last 3 results) No results for input(s): PROBNP in the last 8760 hours. HbA1C: No results for input(s): HGBA1C in the last 72 hours. CBG: No results for input(s): GLUCAP in the last 168 hours. Lipid Profile: No results for input(s): CHOL, HDL, LDLCALC, TRIG, CHOLHDL, LDLDIRECT in the last 72 hours. Thyroid Function Tests: No results for input(s): TSH, T4TOTAL, FREET4, T3FREE, THYROIDAB in the last 72 hours. Anemia Panel: No results for input(s): VITAMINB12, FOLATE, FERRITIN, TIBC, IRON, RETICCTPCT in the last 72 hours. Sepsis Labs: No results for input(s): PROCALCITON, LATICACIDVEN in the last 168 hours.  No results found for this or any previous visit (from the past 240 hour(s)).       Radiology Studies: No results found.      Scheduled Meds: . feeding supplement (ENSURE ENLIVE)  237 mL Oral TID BM  . gabapentin  600 mg Oral TID  . mirtazapine  15 mg Oral QHS  . multivitamin with minerals  1 tablet Oral Daily  . polyethylene glycol  17 g Oral BID  . silver nitrate applicators  2 Stick  Topical Once   Continuous Infusions: . sodium chloride       LOS: 6 days    Dron Tanna Furry, MD [facility] Pager [phone]  If 7PM-7AM, please contact night-coverage [email or web address] Password [email or web address] 04/23/2017, 2:00 PM

## 2017-04-23 NOTE — NC FL2 (Signed)
[place] LEVEL OF CARE SCREENING TOOL     IDENTIFICATION  Patient Name: Ann Oliver Birthdate: [birth year] Sex: female Admission Date (Current Location): 04/16/2017  [facility] and [place] Number:  Herbalist and Address:  [facility],  [place] [address], [place]      Provider Number: [id number]  Attending Physician Name and Address:  Rosita Fire, MD  Relative Name and Phone Number:       Current Level of Care: SNF Recommended Level of Care: [place] Prior Approval Number:    Date Approved/Denied:   PASRR Number:    Discharge Plan: SNF    Current Diagnoses: Patient Active Problem List   Diagnosis Date Noted  . Evaluation by psychiatric service required 04/22/2017  . AKI (acute kidney injury) ([place]) 03/26/2017  . Leg swelling 03/26/2017  . Diarrhea 03/26/2017  . Leukocytosis 03/26/2017  . Anemia 03/26/2017  . Acute bleeding 03/18/2017  . Breast wound 03/18/2017  . Blood loss anemia 01/18/2017  . Port catheter in place 12/02/2016  . Necrotizing soft tissue infection 11/21/2016  . Cellulitis 10/28/2016  . Wound infection 10/28/2016  . Cellulitis of left breast 10/28/2016  . Encounter for palliative care   . Goals of care, counseling/discussion   . Breast cancer metastasized to lung, right ([place]) 09/25/2016  . Malnutrition of moderate degree 09/25/2016  . Breast cancer of upper-outer quadrant of left female breast ([place]) 10/22/2015  . THYROID NODULE, RIGHT 05/31/2007  . Depressed 05/31/2007  . GERD 05/31/2007  . HOT FLASHES 05/31/2007  . HEADACHE 05/31/2007    Orientation RESPIRATION BLADDER Height & Weight     Self, Time, Situation, Place  Normal Continent Weight: 123 lb 10.9 oz (56.1 kg) Height:  5\' 2"  (157.5 cm)  BEHAVIORAL SYMPTOMS/MOOD NEUROLOGICAL BOWEL NUTRITION STATUS      Continent Diet  AMBULATORY STATUS COMMUNICATION OF NEEDS Skin   Extensive Assist Verbally (Pressure  Wound-Breast)                       Personal Care Assistance Level of Assistance  Bathing, Feeding, Dressing Bathing Assistance: Maximum assistance Feeding assistance: Independent Dressing Assistance: Limited assistance     Functional Limitations Info  Sight, Hearing, Speech Sight Info: Adequate Hearing Info: Adequate Speech Info: Adequate    SPECIAL CARE FACTORS FREQUENCY  PT (By licensed PT)                    Contractures Contractures Info: Not present    Additional Factors Info  Code Status, Allergies Code Status Info: DNR Allergies Info:  Aspirin           Current Medications (04/23/2017):  This is the current hospital active medication list Current Facility-Administered Medications  Medication Dose Route Frequency Provider Last Rate Last Dose  . 0.9 %  sodium chloride infusion   Intravenous Once Kirby-Graham, Karsten Fells, NP      . acetaminophen (TYLENOL) tablet 650 mg  650 mg Oral Q6H PRN Elodia Florence., MD   650 mg at 04/22/17 2011  . feeding supplement (ENSURE ENLIVE) (ENSURE ENLIVE) liquid 237 mL  237 mL Oral TID BM Oswald Hillock, MD   237 mL at 04/23/17 0930  . gabapentin (NEURONTIN) capsule 600 mg  600 mg Oral TID Oswald Hillock, MD   600 mg at 04/23/17 0930  . HYDROmorphone (DILAUDID) injection 1 mg  1 mg Intravenous Q4H PRN Nicholas Lose, MD  1 mg at 04/23/17 0218  . iopamidol (ISOVUE-300) 61 % injection 15 mL  15 mL Oral BID PRN Elodia Florence., MD   15 mL at 04/20/17 1453  . mirtazapine (REMERON SOL-TAB) disintegrating tablet 15 mg  15 mg Oral QHS Oswald Hillock, MD   15 mg at 04/22/17 2146  . multivitamin with minerals tablet 1 tablet  1 tablet Oral Daily Caren Griffins, MD   1 tablet at 04/23/17 0930  . ondansetron (ZOFRAN) tablet 4 mg  4 mg Oral Q6H PRN Oswald Hillock, MD   4 mg at 04/21/17 1839   Or  . ondansetron [facility]) injection 4 mg  4 mg Intravenous Q6H PRN Oswald Hillock, MD   4 mg at 04/17/17 2002  . oxyCODONE (Oxy  IR/ROXICODONE) immediate release tablet 5-10 mg  5-10 mg Oral Q4H PRN Elodia Florence., MD   10 mg at 04/23/17 0930  . polyethylene glycol (MIRALAX / GLYCOLAX) packet 17 g  17 g Oral BID Elodia Florence., MD   17 g at 04/23/17 0930  . silver nitrate applicators applicator 2 Stick  2 Stick Topical Once Earnstine Regal, PA-C      . sodium chloride flush (NS) 0.9 % injection 10-40 mL  10-40 mL Intracatheter PRN Dorie Rank, MD   10 mL at 04/20/17 1137   Facility-Administered Medications Ordered in Other Encounters  Medication Dose Route Frequency Provider Last Rate Last Dose  . sodium chloride flush (NS) 0.9 % injection 10 mL  10 mL Intracatheter PRN Nicholas Lose, MD   10 mL at 12/25/16 1702     Discharge Medications: Please see discharge summary for a list of discharge medications.  Relevant Imaging Results:  Relevant Lab Results:   Additional Information ssn:[phone]/Hospice to follow.   Lia Hopping, LCSW

## 2017-04-24 NOTE — Plan of Care (Signed)
  Pain Managment: General experience of comfort will improve 04/24/2017 2044 - Progressing by Mickie Kay, RN

## 2017-04-24 NOTE — Progress Notes (Signed)
PMT progress note  Patient is awake, sitting up in bed, she states that her pain is controlled, rested well overnight, in no distress, hasn't ordered breakfast yet.   There is no family present at the bedside, patient states she is not sure if some one will come to visit with her this weekend, she might get a phone call from her son, she is evasive and does not answer much when asked about her next of kin.   Patient has soaked dressing this am, discussed with bedside RN  BP 98/68 (BP Location: Left Arm) Comment: Instructed to recheck with pt sitting up by RN   Pulse 94   Temp 98.2 F (36.8 C) (Oral)   Resp 16   Ht 5\' 2"  (1.575 m)   Wt 56.1 kg (123 lb 10.9 oz)   SpO2 98%   BMI 22.62 kg/m  Labs and imaging noted.     Awake alert Tangential in her thought process and her thinking process S1 S2 Has large wound dressing on chest, dressing appears soaked this am.  No abdominal pain, some abdominal distension Bilateral LE edema Thin extremities  61 yo female with metastatic fungating breast mass, acute on chronic anemia, malnutrition, ascites, anasarca, serum Albumin of 1.7 gm/dL.   PLAN:  Recommend [facility] for complex wound care management on discharge this time. Patient will benefit from palliative services following, to continue goals of care discussions. No additional palliative care recommendations at this time.      25 minutes spent.    Loistine Chance MD [phone]  [place] palliative medicine team

## 2017-04-24 NOTE — Progress Notes (Signed)
PROGRESS NOTE    Ann Oliver  PJA:[id number] DOB: [birth year] DOA: 04/16/2017 PCP: [facility]   Brief Narrative: 61 y.o.female with history of metastatic fungating breast mass currently on palliative chemotherapy, chronic anemia, malnutrition, depression, with bilateral lower extremity edema who came to ED for worsening bleeding from breast mass. Patient was seen by her home health nurse who instructed her to come to ED for further evaluation. Patient denies fevers or chills. She was found to have a hemoglobin of 5.2  Assessment & Plan:  #Acute blood loss anemia: Received red blood cell transfusion.  Hemoglobin is stable.  General surgery consulted, continue supportive care.  Repeat CBC in the morning.  #Bleeding from fungating breast mass: Patient with poor prognosis.  Recommended hospice care. Palliative care and oncology consult appreciated.  Psychiatrist evaluated the patient and deemed to have capacity to make clinical decision.  I had a prolonged discussion with the patient about current clinical condition and plan of care.  She understand and clearly declined hospice care.  She likes to continue current care including wound care and receiving blood transfusion.  I discussed with the social worker regarding finding a skilled nursing facility or rehab facility for safe discharge plan.  Wound care will be a big challenging issue.   -Wound care consult appreciated. -As per social worker patient will not qualify for LTAC.  Since patient refused hospice C may have to go to a skilled nursing facility.  Follow-up with social workers arrangement.  #Anasarca/ascites: Concerning for malignant ascites.  Continue supportive care. Severe protein calorie malnutrition in the setting of malignancy.  #Pain related with cancer: Continue current pain management.  Pain is controlled.  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  Difficult discharge planning    Consultants:   Oncology  Palliative care  Surgery  Procedures:Wound care Antimicrobials: None  Subjective: Seen and examined at bedside.  No new event.  Denies headache, dizziness, nausea vomiting.  Mild hypotensive this morning.  Objective: Vitals:   04/23/17 2021 04/23/17 2226 04/24/17 0442 04/24/17 0450  BP: (!) 107/51 124/65 (!) 83/68 98/68  Pulse: (!) 110  94   Resp: 14  16   Temp: 98 F (36.7 C)  98.2 F (36.8 C)   TempSrc: Oral  Oral   SpO2: 99%  98%   Weight:      Height:        Intake/Output Summary (Last 24 hours) at 04/24/2017 1230 Last data filed at 04/23/2017 1841 Gross per 24 hour  Intake 480 ml  Output -  Net 480 ml   Filed Weights   04/16/17 1726 04/16/17 2154  Weight: 53.1 kg (117 lb) 56.1 kg (123 lb 10.9 oz)    Examination:  General exam: Ill looking female sitting in bed Respiratory system:  patient has chest binder, no wheezing or crackles appreciated Cardiovascular system: S1 & S2 heard, RRR.  No pedal edema. Gastrointestinal system: Abdomen is nondistended, soft and nontender. Normal bowel sounds heard. Central nervous system: Alert and oriented. No focal neurological deficits. Skin: No rashes, lesions or ulcers Psychiatry: Judgement and insight appear normal. Mood &  affect appropriate.     Data Reviewed: I have personally reviewed following labs and imaging studies  CBC: Recent Labs  Lab 04/19/17 0529 04/20/17 0535 04/21/17 0615 04/21/17 1316 04/22/17 0454 04/23/17 0423  WBC 12.6* 10.9* 9.6  --  10.5 9.6  HGB 8.4* 6.3* 9.1* 7.9* 6.4* 7.7*  HCT 25.8* 19.1* 27.9* 24.0* 19.1*  23.4*  MCV 85.1 86.0 85.1  --  86.4 85.4  PLT 306 282 327  --  302 542   Basic Metabolic Panel: Recent Labs  Lab 04/19/17 0529 04/20/17 0535 04/21/17 0615 04/22/17 0454 04/23/17 0423  NA 137 139 138 138 135  K 4.5 4.2 4.3 4.6 4.5  CL 102 105 105 105 102  CO2 29 29 29 28 27   GLUCOSE 89 95 85 95 99  BUN 12 15 13 14 13   CREATININE 0.96 0.88 0.89 0.77 0.86  CALCIUM 8.0* 7.8* 8.1* 8.0* 8.1*   GFR: Estimated Creatinine Clearance: 53.6 mL/min (by C-G formula based on SCr of 0.86 mg/dL). Liver Function Tests: Recent Labs  Lab 04/20/17 0535 04/21/17 0615 04/22/17 0454  AST 28 34 32  ALT 19 20 19   ALKPHOS 119 145* 132*  BILITOT 0.4 0.7 0.4  PROT 4.8* 5.1* 4.9*  ALBUMIN 1.6* 1.8* 1.7*   No results for input(s): LIPASE, AMYLASE in the last 168 hours. No results for input(s): AMMONIA in the last 168 hours. Coagulation Profile: No results for input(s): INR, PROTIME in the last 168 hours. Cardiac Enzymes: No results for input(s): CKTOTAL, CKMB, CKMBINDEX, TROPONINI in the last 168 hours. BNP (last 3 results) No results for input(s): PROBNP in the last 8760 hours. HbA1C: No results for input(s): HGBA1C in the last 72 hours. CBG: No results for input(s): GLUCAP in the last 168 hours. Lipid Profile: No results for input(s): CHOL, HDL, LDLCALC, TRIG, CHOLHDL, LDLDIRECT in the last 72 hours. Thyroid Function Tests: No results for input(s): TSH, T4TOTAL, FREET4, T3FREE, THYROIDAB in the last 72 hours. Anemia Panel: No results for input(s): VITAMINB12, FOLATE, FERRITIN, TIBC, IRON, RETICCTPCT in the last 72 hours. Sepsis Labs: No results for input(s): PROCALCITON, LATICACIDVEN in the last 168 hours.  No results found for this or any previous visit (from the past 240 hour(s)).       Radiology Studies: No results found.      Scheduled Meds: . feeding supplement (ENSURE ENLIVE)  237 mL Oral TID BM  . gabapentin  600 mg Oral TID  . mirtazapine  15 mg Oral QHS  .  multivitamin with minerals  1 tablet Oral Daily  . polyethylene glycol  17 g Oral BID  . silver nitrate applicators  2 Stick Topical Once   Continuous Infusions: . sodium chloride       LOS: 7 days    Dron Tanna Furry, MD [facility] Pager [phone]  If 7PM-7AM, please contact night-coverage [email or web address] Password [email or web address] 04/24/2017, 12:30 PM

## 2017-04-24 NOTE — Progress Notes (Signed)
Partial dressing change, down to the abd pads which are saturated with  a fowl wet combat dressing/ I replaced  with the help of Kim RN , 3 abd pads 2 rolls of kerlex and 2 rolls of 6" ace wrap. Ann Oliver tolerated well

## 2017-04-25 LAB — CBC
HCT: 23.1 % — ABNORMAL LOW (ref 36.0–46.0)
Hemoglobin: 7.3 g/dL — ABNORMAL LOW (ref 12.0–15.0)
MCH: 27.4 pg (ref 26.0–34.0)
MCHC: 31.6 g/dL (ref 30.0–36.0)
MCV: 86.8 fL (ref 78.0–100.0)
PLATELETS: 629 10*3/uL — AB (ref 150–400)
RBC: 2.66 MIL/uL — AB (ref 3.87–5.11)
RDW: 16.2 % — AB (ref 11.5–15.5)
WBC: 11.4 10*3/uL — AB (ref 4.0–10.5)

## 2017-04-25 MED ORDER — LIP MEDEX EX OINT
TOPICAL_OINTMENT | CUTANEOUS | Status: AC
  Administered 2017-04-25: 10:00:00
  Filled 2017-04-25: qty 7

## 2017-04-25 NOTE — Progress Notes (Signed)
PROGRESS NOTE    Ann Oliver  EQA:[id number] DOB: March 08, 1956 DOA: 04/16/2017 PCP: Center, [facility]   Brief Narrative: 61 y.o.female with history of metastatic fungating breast mass currently on palliative chemotherapy, chronic anemia, malnutrition, depression, with bilateral lower extremity edema who came to ED for worsening bleeding from breast mass. Patient was seen by her home health nurse who instructed her to come to ED for further evaluation. Patient denies fevers or chills. She was found to have a hemoglobin of 5.2  Assessment & Plan:  #Acute blood loss anemia: Received red blood cell transfusion.  General surgery consulted, continue supportive care.  Hemoglobin mildly low today, continue to monitor.  No transfusion.  #Bleeding from fungating breast mass: Patient with poor prognosis.  Recommended hospice care. Palliative care and oncology consult appreciated.  Psychiatrist evaluated the patient and deemed to have capacity to make clinical decision.  I had a prolonged discussion with the patient about current clinical condition and plan of care.  She understand and clearly declined hospice care.  She likes to continue current care including wound care and receiving blood transfusion.  I discussed with the social worker regarding finding a skilled nursing facility or rehab facility for safe discharge plan.  Wound care will be a big challenging issue.   -Wound care consult appreciated. -As per social worker patient will not qualify for LTAC.  Since patient refused hospice she may have to go to a skilled nursing facility.  Follow-up with social workers arrangement. -Also consulted case manager to see if patient qualifies to go to [facility].  I have discussed with the palliative care team today.  #Anasarca/ascites: Concerning for malignant ascites.  Continue supportive care. Severe protein calorie malnutrition in the setting of malignancy.  #Pain related with cancer:  Continue current pain management.  Pain is controlled.  Principal Problem:   Evaluation by psychiatric service required Active Problems:   Breast cancer of upper-outer quadrant of left female breast ([place])   Metastatic breast cancer ([place])   Blood loss anemia   Acute bleeding   Breast wound   Leukocytosis   Anemia   Ascites  DVT prophylaxis: SCD Code Status: DNR Family Communication: No family at bedside Disposition Plan: Currently admitted.  Difficult discharge planning    Consultants:   Oncology  Palliative care  Surgery  Procedures:Wound care Antimicrobials: None  Subjective: Seen and examined at bedside.  No new event.  Pain is controlled.  Not no nausea vomiting.  Objective: Vitals:   04/24/17 0450 04/24/17 1257 04/24/17 2034 04/25/17 0505  BP: 98/68 104/62 (!) 91/57 108/69  Pulse:  (!) 112 (!) 102 96  Resp:  16 16 16   Temp:  97.9 F (36.6 C) 98.9 F (37.2 C) 98.5 F (36.9 C)  TempSrc:  Oral Oral Oral  SpO2:  96% 100% 97%  Weight:      Height:        Intake/Output Summary (Last 24 hours) at 04/25/2017 1121 Last data filed at 04/25/2017 0800 Gross per 24 hour  Intake 477 ml  Output -  Net 477 ml   Filed Weights   04/16/17 1726 04/16/17 2154  Weight: 53.1 kg (117 lb) 56.1 kg (123 lb 10.9 oz)    Examination:  General exam: Not in distress Respiratory system:  patient has chest binder, no wheezing or crackles appreciated, unchanged and no sign of bleeding. Cardiovascular system: Regular rate rhythm, S1-S2 normal.  No pedal edema. Gastrointestinal system: Abdomen is nondistended, soft and nontender. Normal bowel sounds  heard. Central nervous system: Alert and awake. Skin: No rashes, lesions or ulcers Psychiatry: Judgement and insight appear normal. Mood & affect appropriate.     Data Reviewed: I have personally reviewed following labs and imaging studies  CBC: Recent Labs  Lab 04/20/17 0535 04/21/17 0615 04/21/17 1316 04/22/17 0454  04/23/17 0423 04/25/17 0434  WBC 10.9* 9.6  --  10.5 9.6 11.4*  HGB 6.3* 9.1* 7.9* 6.4* 7.7* 7.3*  HCT 19.1* 27.9* 24.0* 19.1* 23.4* 23.1*  MCV 86.0 85.1  --  86.4 85.4 86.8  PLT 282 327  --  302 386 124*   Basic Metabolic Panel: Recent Labs  Lab 04/19/17 0529 04/20/17 0535 04/21/17 0615 04/22/17 0454 04/23/17 0423  NA 137 139 138 138 135  K 4.5 4.2 4.3 4.6 4.5  CL 102 105 105 105 102  CO2 29 29 29 28 27   GLUCOSE 89 95 85 95 99  BUN 12 15 13 14 13   CREATININE 0.96 0.88 0.89 0.77 0.86  CALCIUM 8.0* 7.8* 8.1* 8.0* 8.1*   GFR: Estimated Creatinine Clearance: 53.6 mL/min (by C-G formula based on SCr of 0.86 mg/dL). Liver Function Tests: Recent Labs  Lab 04/20/17 0535 04/21/17 0615 04/22/17 0454  AST 28 34 32  ALT 19 20 19   ALKPHOS 119 145* 132*  BILITOT 0.4 0.7 0.4  PROT 4.8* 5.1* 4.9*  ALBUMIN 1.6* 1.8* 1.7*   No results for input(s): LIPASE, AMYLASE in the last 168 hours. No results for input(s): AMMONIA in the last 168 hours. Coagulation Profile: No results for input(s): INR, PROTIME in the last 168 hours. Cardiac Enzymes: No results for input(s): CKTOTAL, CKMB, CKMBINDEX, TROPONINI in the last 168 hours. BNP (last 3 results) No results for input(s): PROBNP in the last 8760 hours. HbA1C: No results for input(s): HGBA1C in the last 72 hours. CBG: No results for input(s): GLUCAP in the last 168 hours. Lipid Profile: No results for input(s): CHOL, HDL, LDLCALC, TRIG, CHOLHDL, LDLDIRECT in the last 72 hours. Thyroid Function Tests: No results for input(s): TSH, T4TOTAL, FREET4, T3FREE, THYROIDAB in the last 72 hours. Anemia Panel: No results for input(s): VITAMINB12, FOLATE, FERRITIN, TIBC, IRON, RETICCTPCT in the last 72 hours. Sepsis Labs: No results for input(s): PROCALCITON, LATICACIDVEN in the last 168 hours.  No results found for this or any previous visit (from the past 240 hour(s)).       Radiology Studies: No results found.      Scheduled  Meds: . feeding supplement (ENSURE ENLIVE)  237 mL Oral TID BM  . gabapentin  600 mg Oral TID  . mirtazapine  15 mg Oral QHS  . multivitamin with minerals  1 tablet Oral Daily  . polyethylene glycol  17 g Oral BID  . silver nitrate applicators  2 Stick Topical Once   Continuous Infusions: . sodium chloride       LOS: 8 days    Lynsie Mcwatters Tanna Furry, MD [facility] Pager [phone]  If 7PM-7AM, please contact night-coverage [email or web address] Password [facility] 04/25/2017, 11:21 AM

## 2017-04-26 DIAGNOSIS — D500 Iron deficiency anemia secondary to blood loss (chronic): Secondary | ICD-10-CM

## 2017-04-26 DIAGNOSIS — S21001S Unspecified open wound of right breast, sequela: Secondary | ICD-10-CM

## 2017-04-26 MED ORDER — HYDROMORPHONE HCL 2 MG/ML IJ SOLN
2.0000 mg | INTRAMUSCULAR | Status: DC | PRN
  Administered 2017-04-26 – 2017-04-27 (×3): 2 mg via INTRAVENOUS
  Filled 2017-04-26 (×3): qty 1

## 2017-04-26 NOTE — Progress Notes (Signed)
Left breast tumor dressing changed by Will,PA and wound nurse Cecille Rubin McNichols and myself. Blood gushing from lower part of wound. Tumor redressed and pt complained of feeling nauseated. Bp-106/64. Prior to dressing changed Bp-123/63. Medicated with zofran 4 mg. IV,pt also complained of Lt. Upper arm burning, given Tylenol 650 mg. Requesting pain medicine, she is not due yet. Dr. Allyson Sabal contacted waiting for response.

## 2017-04-26 NOTE — Consult Note (Addendum)
[place] Nurse wound follow up Wound type:Left breast mass dressings change. Dressing taken down by Will Creig Hines, CCS PA using NS and large amounts of warm tap water.  Mr. Creig Hines and I are assisted by the patient's bedside RN, Caren Griffins.   Measurement: As prior Wound TGG:YIRSW Drainage (amount, consistency, odor) Strong odor from dressings, tumor and old blood. RN changed outer dressings yesterday. Periwound:dry, intact Dressing procedure/placement/frequency:When combat coagulating gauze and 4x4s are taken down, frank bleeding from tumor is observed.  It is noted to be more profuse than times prior. Combat gauze (2 packages) applied, followed by direct pressure for 5 minutes. Bed linens changed. Gauze 4x4s placed on top of combat gauze, followed by ABD pads (3), then 2 rolls of Kerlix followed by 2 6-inch ACE bandages. Patient complaining of nausea post dressing change, then burning pain.  Medicated by Bedside RN. Next dressing change is Wednesday, 04/28/17. [place] nursing team will follow, and will remain available to this patient, the nursing and medical teams.   Thanks, Maudie Flakes, MSN, RN, [place], Arther Abbott  Pager# [phone]

## 2017-04-26 NOTE — Progress Notes (Signed)
PROGRESS NOTE    Ann Oliver  YPP:[id number] DOB: 07/28/1955 DOA: 04/16/2017 PCP: Center, [facility]   Brief Narrative: 61 y.o.female with history of metastatic fungating breast mass currently on palliative chemotherapy, chronic anemia, malnutrition, depression, with bilateral lower extremity edema who came to ED for worsening bleeding from breast mass. Patient was seen by her home health nurse who instructed her to come to ED for further evaluation. Patient denies fevers or chills. She was found to have a hemoglobin of 5.2. Currently awaiting  social worker recommendations for discharge  Assessment & Plan:  #Acute blood loss anemia: Received red blood cell transfusion. Last hemoglobin 7.3. Will recheck tomorrow. Likely secondary to underlying malignancy with anemia of chronic disease, plus acute blood loss anemia from her fungating mass. General surgery consulted, continue supportive care.     #Bleeding from fungating breast mass: Patient with poor prognosis.  Patient evaluated by oncology [name], he recommended hospice and patient agreed however subsequently she changed her mind, is a DO NOT RESUSCITATE but would accept   wound care, blood transfusion Palliative care and oncology consult appreciated.  Psychiatrist evaluated the patient and deemed to have capacity to make clinical decision.  I had a prolonged discussion with the patient about current clinical condition and plan of care.  She understand and clearly declined hospice care.   .  I discussed with the social worker regarding finding a skilled nursing facility or rehab facility for safe discharge plan.  Wound care will be a big challenging issue.   -Wound care consult appreciated. -As per social worker patient will not qualify for LTAC.  Since patient refused hospice she may have to go to a skilled nursing facility.  Follow-up with social workers arrangement.    #Anasarca/ascites: Concerning for malignant  ascites.  Continue supportive care. Severe protein calorie malnutrition in the setting of malignancy.  #Pain related with cancer: Continue current pain management.  Pain is controlled.  Principal Problem:   Evaluation by psychiatric service required Active Problems:   Breast cancer of upper-outer quadrant of left female breast ([place])   Metastatic breast cancer ([place])   Blood loss anemia   Acute bleeding   Breast wound   Leukocytosis   Anemia   Ascites  DVT prophylaxis: SCD Code Status: DNR Family Communication: No family at bedside Disposition Plan:  Discuss with case worker, they are looking into all discharge disposition options. Currently none    Consultants:   Oncology  Palliative care  Surgery  Procedures:Wound care Antimicrobials: None  Subjective:  Patient sitting up in a chair comfortably, low-grade fever last night, denies any cough congestion. Sore Throat, urinary symptoms   Objective: Vitals:   04/25/17 0505 04/25/17 1409 04/25/17 2027 04/26/17 0507  BP: 108/69 (!) 103/52 104/62 110/60  Pulse: 96 70 72 70  Resp: 16 16 14 18   Temp: 98.5 F (36.9 C) 99.5 F (37.5 C) 99.4 F (37.4 C) 99.3 F (37.4 C)  TempSrc: Oral Oral Oral Oral  SpO2: 97% 94% 95% 96%  Weight:      Height:        Intake/Output Summary (Last 24 hours) at 04/26/2017 1028 Last data filed at 04/26/2017 0800 Gross per 24 hour  Intake 840 ml  Output -  Net 840 ml   Filed Weights   04/16/17 1726 04/16/17 2154  Weight: 53.1 kg (117 lb) 56.1 kg (123 lb 10.9 oz)    Examination:  General exam: Not in distress Respiratory system:  patient has chest  binder, no wheezing or crackles appreciated, unchanged and no sign of bleeding. Cardiovascular system: Regular rate rhythm, S1-S2 normal.  No pedal edema. Gastrointestinal system: Abdomen is nondistended, soft and nontender. Normal bowel sounds heard. Central nervous system: Alert and awake. Skin: No rashes, lesions or ulcers Psychiatry:  Judgement and insight appear normal. Mood & affect appropriate.     Data Reviewed: I have personally reviewed following labs and imaging studies  CBC: Recent Labs  Lab 04/20/17 0535 04/21/17 0615 04/21/17 1316 04/22/17 0454 04/23/17 0423 04/25/17 0434  WBC 10.9* 9.6  --  10.5 9.6 11.4*  HGB 6.3* 9.1* 7.9* 6.4* 7.7* 7.3*  HCT 19.1* 27.9* 24.0* 19.1* 23.4* 23.1*  MCV 86.0 85.1  --  86.4 85.4 86.8  PLT 282 327  --  302 386 563*   Basic Metabolic Panel: Recent Labs  Lab 04/20/17 0535 04/21/17 0615 04/22/17 0454 04/23/17 0423  NA 139 138 138 135  K 4.2 4.3 4.6 4.5  CL 105 105 105 102  CO2 29 29 28 27   GLUCOSE 95 85 95 99  BUN 15 13 14 13   CREATININE 0.88 0.89 0.77 0.86  CALCIUM 7.8* 8.1* 8.0* 8.1*   GFR: Estimated Creatinine Clearance: 53.6 mL/min (by C-G formula based on SCr of 0.86 mg/dL). Liver Function Tests: Recent Labs  Lab 04/20/17 0535 04/21/17 0615 04/22/17 0454  AST 28 34 32  ALT 19 20 19   ALKPHOS 119 145* 132*  BILITOT 0.4 0.7 0.4  PROT 4.8* 5.1* 4.9*  ALBUMIN 1.6* 1.8* 1.7*   No results for input(s): LIPASE, AMYLASE in the last 168 hours. No results for input(s): AMMONIA in the last 168 hours. Coagulation Profile: No results for input(s): INR, PROTIME in the last 168 hours. Cardiac Enzymes: No results for input(s): CKTOTAL, CKMB, CKMBINDEX, TROPONINI in the last 168 hours. BNP (last 3 results) No results for input(s): PROBNP in the last 8760 hours. HbA1C: No results for input(s): HGBA1C in the last 72 hours. CBG: No results for input(s): GLUCAP in the last 168 hours. Lipid Profile: No results for input(s): CHOL, HDL, LDLCALC, TRIG, CHOLHDL, LDLDIRECT in the last 72 hours. Thyroid Function Tests: No results for input(s): TSH, T4TOTAL, FREET4, T3FREE, THYROIDAB in the last 72 hours. Anemia Panel: No results for input(s): VITAMINB12, FOLATE, FERRITIN, TIBC, IRON, RETICCTPCT in the last 72 hours. Sepsis Labs: No results for input(s):  PROCALCITON, LATICACIDVEN in the last 168 hours.  No results found for this or any previous visit (from the past 240 hour(s)).       Radiology Studies: No results found.      Scheduled Meds: . feeding supplement (ENSURE ENLIVE)  237 mL Oral TID BM  . gabapentin  600 mg Oral TID  . mirtazapine  15 mg Oral QHS  . multivitamin with minerals  1 tablet Oral Daily  . polyethylene glycol  17 g Oral BID  . silver nitrate applicators  2 Stick Topical Once   Continuous Infusions: . sodium chloride       LOS: 9 days    Reyne Dumas, MD [facility] Pager [phone]  If 7PM-7AM, please contact night-coverage [email or web address] Password [email or web address] 04/26/2017, 10:28 AM

## 2017-04-26 NOTE — Progress Notes (Signed)
LCSW following for SNF placement.  LCSW faxed patient out to [facility] No beds today and  [facility] Nurse and [employer], patient under review.   LCSW will continue to follow.

## 2017-04-27 DIAGNOSIS — Z008 Encounter for other general examination: Secondary | ICD-10-CM

## 2017-04-27 DIAGNOSIS — C50412 Malignant neoplasm of upper-outer quadrant of left female breast: Principal | ICD-10-CM

## 2017-04-27 DIAGNOSIS — D508 Other iron deficiency anemias: Secondary | ICD-10-CM

## 2017-04-27 DIAGNOSIS — R58 Hemorrhage, not elsewhere classified: Secondary | ICD-10-CM

## 2017-04-27 LAB — BASIC METABOLIC PANEL
Anion gap: 5 (ref 5–15)
BUN: 16 mg/dL (ref 6–20)
CALCIUM: 8.2 mg/dL — AB (ref 8.9–10.3)
CHLORIDE: 102 mmol/L (ref 101–111)
CO2: 29 mmol/L (ref 22–32)
CREATININE: 0.88 mg/dL (ref 0.44–1.00)
Glucose, Bld: 95 mg/dL (ref 65–99)
Potassium: 4.3 mmol/L (ref 3.5–5.1)
SODIUM: 136 mmol/L (ref 135–145)

## 2017-04-27 LAB — CBC
HCT: 16.1 % — ABNORMAL LOW (ref 36.0–46.0)
HEMOGLOBIN: 5.1 g/dL — AB (ref 12.0–15.0)
MCH: 27.1 pg (ref 26.0–34.0)
MCHC: 31.7 g/dL (ref 30.0–36.0)
MCV: 85.6 fL (ref 78.0–100.0)
Platelets: 547 10*3/uL — ABNORMAL HIGH (ref 150–400)
RBC: 1.88 MIL/uL — ABNORMAL LOW (ref 3.87–5.11)
RDW: 16.4 % — ABNORMAL HIGH (ref 11.5–15.5)
WBC: 12.4 10*3/uL — ABNORMAL HIGH (ref 4.0–10.5)

## 2017-04-27 LAB — PREPARE RBC (CROSSMATCH)

## 2017-04-27 MED ORDER — HYDROMORPHONE HCL 2 MG/ML IJ SOLN
2.0000 mg | Freq: Four times a day (QID) | INTRAMUSCULAR | Status: DC | PRN
  Administered 2017-04-27 – 2017-04-28 (×3): 2 mg via INTRAVENOUS
  Filled 2017-04-27 (×4): qty 1

## 2017-04-27 MED ORDER — OXYCODONE HCL ER 10 MG PO T12A
10.0000 mg | EXTENDED_RELEASE_TABLET | Freq: Two times a day (BID) | ORAL | Status: DC
  Administered 2017-04-27 – 2017-04-28 (×3): 10 mg via ORAL
  Filled 2017-04-27 (×3): qty 1

## 2017-04-27 MED ORDER — SODIUM CHLORIDE 0.9 % IV SOLN: Freq: Once | INTRAVENOUS | Status: DC

## 2017-04-27 NOTE — Progress Notes (Signed)
CSW discussed SNF placement with [facility]-S[facility] Liaison. She reports the facility inquired if [facility] could assist with the dressing changes.  CSW discussed w/ Cassandra the intake nurse and provided requested clinical information. She reports hospice will do the dressing changes if the patient is accepted to [facility].   CSW and liaison met with patient explain role. The patient at this time agreeable to [facility] if a bed offer is made.   The facility is reviewing the patient information.    The patient has been accepted to [facility] w/ [facility] following to complete dressing changes.   CSW will assist with the discharge in the a.m if medically ready.    Kathrin Greathouse, Latanya Presser, MSW Clinical Social Worker  [phone] 04/27/2017  4:30 PM

## 2017-04-27 NOTE — Progress Notes (Signed)
Critical lab result hgb 5.1 K.Kriby notified at [phone].

## 2017-04-27 NOTE — Progress Notes (Signed)
PROGRESS NOTE    Ann Oliver  NGE:[id number] DOB: Jan 21, 1956 DOA: 04/16/2017 PCP: [facility], [facility]   Brief Narrative: 61 y.o.female with history of metastatic fungating breast mass currently on palliative chemotherapy, chronic anemia, malnutrition, depression, with bilateral lower extremity edema who came to ED for worsening bleeding from breast mass. Patient was seen by her home health nurse who instructed her to come to ED for further evaluation. Patient denies fevers or chills. She was found to have a hemoglobin of 5.2, subsequently transfused. Currently awaiting  social worker recommendations for discharge  Assessment & Plan:  #Acute blood loss anemia: Received red blood cell transfusion. Last hemoglobin 7.3. Hemoglobin dropped to 5.1 this morning. Ordered to receive 2 units of packed red blood cells. We will recheck CBC tomorrow.   Likely secondary to underlying malignancy with anemia of chronic disease, plus acute blood loss anemia from her fungating mass , noted to have bleeding from the lower part of the wound. Bleeding seems to be worsening as per wound care note.. General surgery consulted, continue supportive care.     #Bleeding from fungating breast mass: Patient with poor prognosis.  Patient evaluated by oncology [name], he recommended hospice and patient agreed however subsequently she changed her mind, is a DO NOT RESUSCITATE but would accept   wound care, blood transfusion Palliative care and oncology consult appreciated.  Psychiatrist evaluated the patient and deemed to have capacity to make clinical decision.  I had a prolonged discussion with the patient about current clinical condition and plan of care.  She understand and clearly declined hospice care.   .  I discussed with the social worker regarding finding a skilled nursing facility or rehab facility for safe discharge plan.  Wound care will be a big challenging issue.   -Wound care consult  appreciated.Gauze 4x4s placed, followed by ABD pads (3), then 2 rolls of Kerlix followed by 2 6-inch ACE bandages.  #Disposition -As per social worker patient will not qualify for LTAC.  Since patient refused hospice she may have to go to a skilled nursing facility.  Follow-up with social workers arrangement.    #Anasarca/ascites: Concerning for malignant ascites.  Continue supportive care. Severe protein calorie malnutrition in the setting of malignancy.will start ensure   #Pain related with cancer: Continue current pain management.  Pain is controlled.Added OxyContin, to oxycodone,wean IV Dilaudid  Principal Problem:   Evaluation by psychiatric service required Active Problems:   Breast cancer of upper-outer quadrant of left female breast ([place])   Metastatic breast cancer ([place])   Blood loss anemia   Acute bleeding   Breast wound   Leukocytosis   Anemia   Ascites  DVT prophylaxis: SCD Code Status: DNR Family Communication: No family at bedside Disposition Plan:  Discuss with case worker, they are looking into all discharge disposition options.      Consultants:   Oncology  Palliative care  Surgery  Procedures:Wound care Antimicrobials: None  Subjective:  Continues to have low-grade fever but no focal symptoms Apparently was dizzy and lightheaded with ambulation yesterday Notes  that she is being transfused   Objective: Vitals:   04/26/17 0507 04/26/17 1412 04/26/17 2149 04/27/17 0552  BP: 110/60 111/60 (!) 95/50 110/75  Pulse: 70 97 (!) 103 (!) 102  Resp: 18 16 15 16   Temp: 99.3 F (37.4 C) 99.5 F (37.5 C) 99.5 F (37.5 C) 99.8 F (37.7 C)  TempSrc: Oral Oral Oral Oral  SpO2: 96% 100% 99% 100%  Weight:  Height:        Intake/Output Summary (Last 24 hours) at 04/27/2017 0901 Last data filed at 04/26/2017 1800 Gross per 24 hour  Intake 2244 ml  Output 1460 ml  Net 784 ml   Filed Weights   04/16/17 1726 04/16/17 2154  Weight: 53.1 kg (117 lb)  56.1 kg (123 lb 10.9 oz)    Examination:  General exam: Not in distress Respiratory system:  patient has chest binder, no wheezing or crackles appreciated, unchanged and no sign of bleeding. Cardiovascular system: Regular rate rhythm, S1-S2 normal.  No pedal edema. Gastrointestinal system: Abdomen is nondistended, soft and nontender. Normal bowel sounds heard. Central nervous system: Alert and awake. Skin: No rashes, lesions or ulcers Psychiatry: Judgement and insight appear normal. Mood & affect appropriate.     Data Reviewed: I have personally reviewed following labs and imaging studies  CBC: Recent Labs  Lab 04/21/17 0615 04/21/17 1316 04/22/17 0454 04/23/17 0423 04/25/17 0434 04/27/17 0554  WBC 9.6  --  10.5 9.6 11.4* 12.4*  HGB 9.1* 7.9* 6.4* 7.7* 7.3* 5.1*  HCT 27.9* 24.0* 19.1* 23.4* 23.1* 16.1*  MCV 85.1  --  86.4 85.4 86.8 85.6  PLT 327  --  302 386 629* 850*   Basic Metabolic Panel: Recent Labs  Lab 04/21/17 0615 04/22/17 0454 04/23/17 0423 04/27/17 0554  NA 138 138 135 136  K 4.3 4.6 4.5 4.3  CL 105 105 102 102  CO2 29 28 27 29   GLUCOSE 85 95 99 95  BUN 13 14 13 16   CREATININE 0.89 0.77 0.86 0.88  CALCIUM 8.1* 8.0* 8.1* 8.2*   GFR: Estimated Creatinine Clearance: 52.4 mL/min (by C-G formula based on SCr of 0.88 mg/dL). Liver Function Tests: Recent Labs  Lab 04/21/17 0615 04/22/17 0454  AST 34 32  ALT 20 19  ALKPHOS 145* 132*  BILITOT 0.7 0.4  PROT 5.1* 4.9*  ALBUMIN 1.8* 1.7*   No results for input(s): LIPASE, AMYLASE in the last 168 hours. No results for input(s): AMMONIA in the last 168 hours. Coagulation Profile: No results for input(s): INR, PROTIME in the last 168 hours. Cardiac Enzymes: No results for input(s): CKTOTAL, CKMB, CKMBINDEX, TROPONINI in the last 168 hours. BNP (last 3 results) No results for input(s): PROBNP in the last 8760 hours. HbA1C: No results for input(s): HGBA1C in the last 72 hours. CBG: No results for  input(s): GLUCAP in the last 168 hours. Lipid Profile: No results for input(s): CHOL, HDL, LDLCALC, TRIG, CHOLHDL, LDLDIRECT in the last 72 hours. Thyroid Function Tests: No results for input(s): TSH, T4TOTAL, FREET4, T3FREE, THYROIDAB in the last 72 hours. Anemia Panel: No results for input(s): VITAMINB12, FOLATE, FERRITIN, TIBC, IRON, RETICCTPCT in the last 72 hours. Sepsis Labs: No results for input(s): PROCALCITON, LATICACIDVEN in the last 168 hours.  No results found for this or any previous visit (from the past 240 hour(s)).       Radiology Studies: No results found.      Scheduled Meds: . feeding supplement (ENSURE ENLIVE)  237 mL Oral TID BM  . gabapentin  600 mg Oral TID  . mirtazapine  15 mg Oral QHS  . multivitamin with minerals  1 tablet Oral Daily  . oxyCODONE  10 mg Oral Q12H  . polyethylene glycol  17 g Oral BID  . silver nitrate applicators  2 Stick Topical Once   Continuous Infusions: . sodium chloride    . sodium chloride       LOS: 10  days    Reyne Dumas, MD [facility] Pager [phone]  If 7PM-7AM, please contact night-coverage [email or web address] Password [email or web address] 04/27/2017, 9:01 AM

## 2017-04-28 LAB — CBC
HCT: 26.3 % — ABNORMAL LOW (ref 36.0–46.0)
HEMOGLOBIN: 8.7 g/dL — AB (ref 12.0–15.0)
MCH: 28.6 pg (ref 26.0–34.0)
MCHC: 33.1 g/dL (ref 30.0–36.0)
MCV: 86.5 fL (ref 78.0–100.0)
Platelets: 590 10*3/uL — ABNORMAL HIGH (ref 150–400)
RBC: 3.04 MIL/uL — AB (ref 3.87–5.11)
RDW: 15.4 % (ref 11.5–15.5)
WBC: 16.5 10*3/uL — AB (ref 4.0–10.5)

## 2017-04-28 LAB — BPAM RBC
BLOOD PRODUCT EXPIRATION DATE: 201812222359
Blood Product Expiration Date: 201812222359
ISSUE DATE / TIME: 201812041338
ISSUE DATE / TIME: 201812041044
UNIT TYPE AND RH: 6200
Unit Type and Rh: 6200

## 2017-04-28 LAB — TYPE AND SCREEN
ABO/RH(D): A POS
Antibody Screen: NEGATIVE
UNIT DIVISION: 0
Unit division: 0

## 2017-04-28 MED ORDER — OXYCODONE-ACETAMINOPHEN 5-325 MG PO TABS: 2.0000 | ORAL_TABLET | Freq: Four times a day (QID) | ORAL | 0 refills | Status: DC | PRN

## 2017-04-28 MED ORDER — POLYETHYLENE GLYCOL 3350 17 G PO PACK: 17.0000 g | PACK | Freq: Every day | ORAL | 0 refills | Status: DC

## 2017-04-28 MED ORDER — OXYCODONE HCL ER 10 MG PO T12A: 10.0000 mg | EXTENDED_RELEASE_TABLET | Freq: Two times a day (BID) | ORAL | 0 refills | Status: DC

## 2017-04-28 NOTE — Clinical Social Work Placement (Signed)
  Pt discharged and will admit to [facility], Report [phone] CSW informed [facility] of pt's DC as well- they will follow her at facility.  Pt will transport via [facility]- completed medical necessity form and arranged transportation. All information provided to facility via the [place]. Pt requested no one in her family/friends be notified. See below for placement details.   CLINICAL SOCIAL WORK PLACEMENT  NOTE  Date:  04/28/2017  Patient Details  Name: Ann Oliver MRN: [id number] Date of Birth: 1956/04/29  Clinical Social Work is seeking post-discharge placement for this patient at the [place] level of care (*CSW will initial, date and re-position this form in  chart as items are completed):  Yes   Patient/family provided with [place] Work Department's list of facilities offering this level of care within the geographic area requested by the patient (or if unable, by the patient's family).  Yes   Patient/family informed of their freedom to choose among providers that offer the needed level of care, that participate in Medicare, Medicaid or managed care program needed by the patient, have an available bed and are willing to accept the patient.  Yes   Patient/family informed of [facility]'s ownership interest in [facility] and [facility], as well as of the fact that they are under no obligation to receive care at these facilities.  PASRR submitted to EDS on 04/23/17     PASRR number received on 04/23/17     Existing PASRR number confirmed on       FL2 transmitted to all facilities in geographic area requested by pt/family on 04/23/17     FL2 transmitted to all facilities within larger geographic area on       Patient informed that his/her managed care company has contracts with or will negotiate with certain facilities, including the following:        Yes   Patient/family informed of bed offers  received.  Patient chooses bed at [facility])     Physician recommends and patient chooses bed at [facility])    Patient to be transferred to [facility]) on 04/28/17.  Patient to be transferred to facility by [facility]     Patient family notified on 04/28/17 of transfer.  Name of family member notified:  pt requests family not be notified     PHYSICIAN       Additional Comment:    _______________________________________________ Nila Nephew, LCSW 04/28/2017, 11:26 AM

## 2017-04-28 NOTE — Consult Note (Signed)
[place] Nurse wound consult note Reason for Consult:Left breast mass dressing change. I am assisted in this change today by Margie Billet, CCS PA and Fara Olden, WTA-C.  Bedside RN provided standby assistance for linen removal and supply provision. Initially, patient declined dressing change stating that "they will do it when she got to the new place". She is encouraged to allow [place] to change dressing prior to transfer so that she can arrive clean, dry and odor free.  She agreed to dressing change. Wound type: Neoplastic Pressure Injury POA: N/A Measurement: As prior Wound TKZ:SWFUX Drainage (amount, consistency, odor) Strong odor from dressings consistent with necrosis and old blood. Periwound:dry, intact Dressing procedure/placement/frequency: Patient received premedication. Dressings taken down slowly, layer by layer until last 2-3 layers of combat gauze remained and blood was seeping through. Large amounts of saline used to loosen combat gauze until it could be removed; frank bleeding ensued following removal of gauze. It is profuse and pulsating from at least two areas, one at 6 o'clock, oozing from other (all) areas. Combat gauze (cut into strips 8 inches in length) used to cover tumor, this is topped with washcloths and direct pressure held to stop bleeding for 3-5 minutes and then washcloths are removed.  Dry gauze 4x4s are used to top combat gauze, followed by 4 ABD pads.  3 rolls of Kerlix are used to secure ABD pads and provide compression, these are topped with 2 six-inch ACE wraps applied for compression and securement. Patient assisted into clean gown and bed linens changed. Patient medicated with Tylenol and something for nausea. Patient is to be transferred with 6 packages of combat gauze, 6 ABD pads, 6 rolls of Kerlix, 4 6-inch ACE bandages and 25 4x4s. Supplies boxed and at bedside.  [place] nursing team will not follow in the post acute setting, but will remain available to this patient, the  nursing, surgical and medical teams if readmitted. Thanks, Maudie Flakes, MSN, RN, [place], Arther Abbott  Pager# [phone]

## 2017-04-28 NOTE — Progress Notes (Signed)
Pt transitioning to [facility] today with hospice [facility] [place]). If pt arrives at facility prior to 15:00 today, hospice can manage her dressing changes at facility today, otherwise facility states pt would need dressing changed at hospital prior to transport. Spoke with facility who is prepared to have pt admit today. Updated attending. Will follow and assist at DC.  Sharren Bridge, MSW, LCSW Clinical Social Work 04/28/2017 [phone]

## 2017-04-28 NOTE — Discharge Summary (Addendum)
Physician Discharge Summary  Ann Oliver [id number] DOB: 1955-06-13 DOA: 04/16/2017  PCP: [facility]  Admit date: 04/16/2017 Discharge date: 04/28/2017  Admitted From:home Disposition: SNF  Recommendations for Outpatient Follow-up:  1. Follow up with PCP in 1-2 weeks 2. Please follow with hospice care at [facility].  Home Health:SNF Equipment/Devices:none Discharge Condition:stable CODE STATUS:DNR Diet recommendation:regular  Brief/Interim Summary: 61 y.o.female with history of metastatic fungating breast mass currently on palliative chemotherapy, chronic anemia, malnutrition, depression, with bilateral lower extremity edema who came to ED for worsening bleeding from breast mass. Patient was seen by her home health nurse who instructed her to come to ED for further evaluation. Patient denies fevers or chills. She was found to have a hemoglobin of 5.2, subsequently transfused. Currently awaiting  social worker recommendations for discharge  #Acute blood loss anemia: Received red blood cell transfusion.  General surgery consulted, continue supportive care.   Continue to monitor.  #Bleeding from fungating breast mass: Patient with poor prognosis.  Recommended hospice care. Palliative care and oncology consult appreciated.  Psychiatrist evaluated the patient and deemed to have capacity to make clinical decision.   Patient is DNR and except wound care and blood transfusion. Wound care consult appreciated.Gauze 4x4s placed, followed by ABD pads (3), then 2 rolls of Kerlix followed by 2 6-inch ACE bandages.  Patient will follow up with hospice care at a skilled nursing facility. Dressing change three times per week at SNF.  #Anasarca/ascites: Concerning for malignant ascites.  Continue supportive care. Severe protein calorie malnutrition in the setting of malignancy.  Oncology follow-up.  #Pain related with cancer: Continue current pain management.  Pain is  controlled.  Discussed with the Education officer, museum.  Patient has bed at a skilled nursing facility.  Patient has poor prognosis.  She is a DNR DNI.  Transferring her care to outpatient.  Discharge Diagnoses:  Principal Problem: Acute blood loss anemia   Evaluation by psychiatric service required Active Problems:   Breast cancer of upper-outer quadrant of left female breast ([place])   Metastatic breast cancer ([place])   Blood loss anemia   Acute bleeding   Breast wound   Leukocytosis   Anemia   Ascites    Discharge Instructions  Discharge Instructions    Call MD for:  difficulty breathing, headache or visual disturbances   Complete by:  As directed    Call MD for:  extreme fatigue   Complete by:  As directed    Call MD for:  hives   Complete by:  As directed    Call MD for:  persistant dizziness or light-headedness   Complete by:  As directed    Call MD for:  persistant nausea and vomiting   Complete by:  As directed    Call MD for:  severe uncontrolled pain   Complete by:  As directed    Call MD for:  temperature >100.4   Complete by:  As directed    Diet general   Complete by:  As directed    Discharge instructions   Complete by:  As directed    Follow up with hospice care at SNF   Increase activity slowly   Complete by:  As directed      Allergies as of 04/28/2017      Reactions   Aspirin Palpitations      Medication List    STOP taking these medications   loperamide 2 MG capsule Commonly known as:  IMODIUM     TAKE these  medications   ANUCORT-HC 25 MG suppository Generic drug:  hydrocortisone unwrap and insert 1 suppository rectally twice a day if needed for HEMORRHOIDS OR ANAL ITCHING   feeding supplement (ENSURE ENLIVE) Liqd Take 237 mLs by mouth 3 (three) times daily between meals.   gabapentin 600 MG tablet Commonly known as:  NEURONTIN Take 1 tablet (600 mg total) 3 (three) times daily by mouth.   mirtazapine 15 MG disintegrating tablet Commonly  known as:  REMERON SOL-TAB Take 1 tablet (15 mg total) by mouth at bedtime.   ondansetron 8 MG tablet Commonly known as:  ZOFRAN take 1 tablet by mouth every 8 hours if needed for nausea and vomiting   oxyCODONE 10 mg 12 hr tablet Commonly known as:  OXYCONTIN Take 1 tablet (10 mg total) by mouth every 12 (twelve) hours.   oxyCODONE-acetaminophen 5-325 MG tablet Commonly known as:  PERCOCET/ROXICET Take 2 tablets by mouth every 6 (six) hours as needed for severe pain. What changed:  when to take this   polyethylene glycol packet Commonly known as:  MIRALAX / GLYCOLAX Take 17 g by mouth daily.      Follow-up Information    Nicholas Lose, MD Follow up.   Specialty:  Hematology and Oncology Contact information: [place] [address] [phone]          Allergies  Allergen Reactions  . Aspirin Palpitations    Consultations: Oncology Palliative care  Procedures/Studies: None  Subjective: Seen and examined at bedside.  Pain is controlled.  Denied nausea vomiting shortness of breath.  Discharge Exam: Vitals:   04/27/17 2130 04/28/17 0509  BP: (!) 99/49 107/70  Pulse: (!) 106 (!) 108  Resp: 15 18  Temp: 98.7 F (37.1 C) 99.7 F (37.6 C)  SpO2: 99% 100%   Vitals:   04/27/17 1400 04/27/17 1730 04/27/17 2130 04/28/17 0509  BP: 101/60 (!) 107/57 (!) 99/49 107/70  Pulse: 99 89 (!) 106 (!) 108  Resp: 18 16 15 18   Temp: 98.9 F (37.2 C) 99.4 F (37.4 C) 98.7 F (37.1 C) 99.7 F (37.6 C)  TempSrc: Oral Tympanic Oral Oral  SpO2: 99%  99% 100%  Weight:      Height:        General: Pt is alert, awake, not in acute distress Cardiovascular: RRR, S1/S2 +, no rubs, no gallops Respiratory: Chest has a bandage applied with no sign of bleeding.  The lungs clear. Abdominal: Soft, NT, ND, bowel sounds + Extremities: no edema, no cyanosis    The results of significant diagnostics from this hospitalization (including imaging,  microbiology, ancillary and laboratory) are listed below for reference.     Microbiology: No results found for this or any previous visit (from the past 240 hour(s)).   Labs: BNP (last 3 results) Recent Labs    03/26/17 2043  BNP 053.9*   Basic Metabolic Panel: Recent Labs  Lab 04/22/17 0454 04/23/17 0423 04/27/17 0554  NA 138 135 136  K 4.6 4.5 4.3  CL 105 102 102  CO2 28 27 29   GLUCOSE 95 99 95  BUN 14 13 16   CREATININE 0.77 0.86 0.88  CALCIUM 8.0* 8.1* 8.2*   Liver Function Tests: Recent Labs  Lab 04/22/17 0454  AST 32  ALT 19  ALKPHOS 132*  BILITOT 0.4  PROT 4.9*  ALBUMIN 1.7*   No results for input(s): LIPASE, AMYLASE in the last 168 hours. No results for input(s): AMMONIA in the last 168 hours. CBC: Recent  Labs  Lab 04/22/17 0454 04/23/17 0423 04/25/17 0434 04/27/17 0554 04/28/17 0500  WBC 10.5 9.6 11.4* 12.4* 16.5*  HGB 6.4* 7.7* 7.3* 5.1* 8.7*  HCT 19.1* 23.4* 23.1* 16.1* 26.3*  MCV 86.4 85.4 86.8 85.6 86.5  PLT 302 386 629* 547* 590*   Cardiac Enzymes: No results for input(s): CKTOTAL, CKMB, CKMBINDEX, TROPONINI in the last 168 hours. BNP: Invalid input(s): POCBNP CBG: No results for input(s): GLUCAP in the last 168 hours. D-Dimer No results for input(s): DDIMER in the last 72 hours. Hgb A1c No results for input(s): HGBA1C in the last 72 hours. Lipid Profile No results for input(s): CHOL, HDL, LDLCALC, TRIG, CHOLHDL, LDLDIRECT in the last 72 hours. Thyroid function studies No results for input(s): TSH, T4TOTAL, T3FREE, THYROIDAB in the last 72 hours.  Invalid input(s): FREET3 Anemia work up No results for input(s): VITAMINB12, FOLATE, FERRITIN, TIBC, IRON, RETICCTPCT in the last 72 hours. Urinalysis    Component Value Date/Time   COLORURINE YELLOW 03/27/2017 1459   APPEARANCEUR CLEAR 03/27/2017 1459   LABSPEC 1.012 03/27/2017 1459   PHURINE 5.0 03/27/2017 1459   GLUCOSEU NEGATIVE 03/27/2017 1459   HGBUR NEGATIVE 03/27/2017 1459    BILIRUBINUR NEGATIVE 03/27/2017 1459   KETONESUR 5 (A) 03/27/2017 1459   PROTEINUR NEGATIVE 03/27/2017 1459   NITRITE NEGATIVE 03/27/2017 1459   LEUKOCYTESUR TRACE (A) 03/27/2017 1459   Sepsis Labs Invalid input(s): PROCALCITONIN,  WBC,  LACTICIDVEN Microbiology No results found for this or any previous visit (from the past 240 hour(s)).   Time coordinating discharge: 30 minutes  SIGNED:   Rosita Fire, MD  [facility] 04/28/2017, 11:09 AM  If 7PM-7AM, please contact night-coverage [email or web address] Password [email or web address]

## 2017-04-28 NOTE — Progress Notes (Signed)
Report given to the RN at [facility] .

## 2017-04-29 ENCOUNTER — Other Ambulatory Visit: Payer: Self-pay

## 2017-04-29 ENCOUNTER — Inpatient Hospital Stay (HOSPITAL_COMMUNITY)
Admission: EM | Admit: 2017-04-29 | Discharge: 2017-05-07 | DRG: 597 | Disposition: A | Discharge status: Home / Self Care | Payer: Medicaid Other | Attending: Internal Medicine | Admitting: Internal Medicine

## 2017-04-29 ENCOUNTER — Encounter (HOSPITAL_COMMUNITY): Payer: Self-pay | Admitting: Cardiology

## 2017-04-29 DIAGNOSIS — C50412 Malignant neoplasm of upper-outer quadrant of left female breast: Secondary | ICD-10-CM | POA: Diagnosis present

## 2017-04-29 DIAGNOSIS — C787 Secondary malignant neoplasm of liver and intrahepatic bile duct: Secondary | ICD-10-CM | POA: Diagnosis present

## 2017-04-29 DIAGNOSIS — R58 Hemorrhage, not elsewhere classified: Secondary | ICD-10-CM | POA: Diagnosis not present

## 2017-04-29 DIAGNOSIS — Z590 Homelessness: Secondary | ICD-10-CM | POA: Diagnosis not present

## 2017-04-29 DIAGNOSIS — Z9221 Personal history of antineoplastic chemotherapy: Secondary | ICD-10-CM | POA: Diagnosis not present

## 2017-04-29 DIAGNOSIS — X58XXXD Exposure to other specified factors, subsequent encounter: Secondary | ICD-10-CM | POA: Diagnosis present

## 2017-04-29 DIAGNOSIS — G893 Neoplasm related pain (acute) (chronic): Secondary | ICD-10-CM | POA: Diagnosis present

## 2017-04-29 DIAGNOSIS — C50919 Malignant neoplasm of unspecified site of unspecified female breast: Secondary | ICD-10-CM | POA: Diagnosis present

## 2017-04-29 DIAGNOSIS — Z9119 Patient's noncompliance with other medical treatment and regimen: Secondary | ICD-10-CM | POA: Diagnosis not present

## 2017-04-29 DIAGNOSIS — C779 Secondary and unspecified malignant neoplasm of lymph node, unspecified: Secondary | ICD-10-CM | POA: Diagnosis present

## 2017-04-29 DIAGNOSIS — T148XXA Other injury of unspecified body region, initial encounter: Secondary | ICD-10-CM

## 2017-04-29 DIAGNOSIS — C50911 Malignant neoplasm of unspecified site of right female breast: Secondary | ICD-10-CM | POA: Diagnosis not present

## 2017-04-29 DIAGNOSIS — D500 Iron deficiency anemia secondary to blood loss (chronic): Secondary | ICD-10-CM | POA: Diagnosis present

## 2017-04-29 DIAGNOSIS — Z886 Allergy status to analgesic agent status: Secondary | ICD-10-CM

## 2017-04-29 DIAGNOSIS — Z170 Estrogen receptor positive status [ER+]: Secondary | ICD-10-CM | POA: Diagnosis not present

## 2017-04-29 DIAGNOSIS — E43 Unspecified severe protein-calorie malnutrition: Secondary | ICD-10-CM | POA: Diagnosis present

## 2017-04-29 DIAGNOSIS — E440 Moderate protein-calorie malnutrition: Secondary | ICD-10-CM | POA: Diagnosis not present

## 2017-04-29 DIAGNOSIS — F329 Major depressive disorder, single episode, unspecified: Secondary | ICD-10-CM | POA: Diagnosis present

## 2017-04-29 DIAGNOSIS — Z7189 Other specified counseling: Secondary | ICD-10-CM | POA: Diagnosis not present

## 2017-04-29 DIAGNOSIS — N63 Unspecified lump in unspecified breast: Secondary | ICD-10-CM

## 2017-04-29 DIAGNOSIS — D72829 Elevated white blood cell count, unspecified: Secondary | ICD-10-CM | POA: Diagnosis present

## 2017-04-29 DIAGNOSIS — C7800 Secondary malignant neoplasm of unspecified lung: Secondary | ICD-10-CM | POA: Diagnosis present

## 2017-04-29 DIAGNOSIS — S21002D Unspecified open wound of left breast, subsequent encounter: Secondary | ICD-10-CM

## 2017-04-29 DIAGNOSIS — Z923 Personal history of irradiation: Secondary | ICD-10-CM | POA: Diagnosis not present

## 2017-04-29 DIAGNOSIS — Z66 Do not resuscitate: Secondary | ICD-10-CM | POA: Diagnosis present

## 2017-04-29 DIAGNOSIS — Z515 Encounter for palliative care: Secondary | ICD-10-CM | POA: Diagnosis not present

## 2017-04-29 DIAGNOSIS — D62 Acute posthemorrhagic anemia: Secondary | ICD-10-CM | POA: Diagnosis present

## 2017-04-29 DIAGNOSIS — Z6822 Body mass index (BMI) 22.0-22.9, adult: Secondary | ICD-10-CM

## 2017-04-29 DIAGNOSIS — D649 Anemia, unspecified: Secondary | ICD-10-CM | POA: Diagnosis present

## 2017-04-29 DIAGNOSIS — D0502 Lobular carcinoma in situ of left breast: Secondary | ICD-10-CM | POA: Diagnosis present

## 2017-04-29 HISTORY — DX: Malignant neoplasm of unspecified site of unspecified female breast: C50.919

## 2017-04-29 LAB — BASIC METABOLIC PANEL
Anion gap: 8 (ref 5–15)
BUN: 19 mg/dL (ref 6–20)
CO2: 26 mmol/L (ref 22–32)
CREATININE: 0.92 mg/dL (ref 0.44–1.00)
Calcium: 8.1 mg/dL — ABNORMAL LOW (ref 8.9–10.3)
Chloride: 102 mmol/L (ref 101–111)
GFR calc Af Amer: >60 mL/min (ref >60)
GLUCOSE: 136 mg/dL — AB (ref 65–99)
Potassium: 5 mmol/L (ref 3.5–5.1)
SODIUM: 136 mmol/L (ref 135–145)

## 2017-04-29 LAB — CBC WITH DIFFERENTIAL/PLATELET
Basophils Absolute: 0 K/uL (ref 0.0–0.1)
Basophils Relative: 0 %
Eosinophils Absolute: 0.3 K/uL (ref 0.0–0.7)
Eosinophils Relative: 2 %
HCT: 21.4 % — ABNORMAL LOW (ref 36.0–46.0)
Hemoglobin: 6.7 g/dL — CL (ref 12.0–15.0)
Lymphocytes Relative: 8 %
Lymphs Abs: 1.3 K/uL (ref 0.7–4.0)
MCH: 28 pg (ref 26.0–34.0)
MCHC: 31.3 g/dL (ref 30.0–36.0)
MCV: 89.5 fL (ref 78.0–100.0)
Monocytes Absolute: 1.3 K/uL — ABNORMAL HIGH (ref 0.1–1.0)
Monocytes Relative: 8 %
Neutro Abs: 13.6 K/uL — ABNORMAL HIGH (ref 1.7–7.7)
Neutrophils Relative %: 82 %
Platelets: 603 K/uL — ABNORMAL HIGH (ref 150–400)
RBC: 2.39 MIL/uL — ABNORMAL LOW (ref 3.87–5.11)
RDW: 15.8 % — ABNORMAL HIGH (ref 11.5–15.5)
WBC: 16.5 K/uL — ABNORMAL HIGH (ref 4.0–10.5)

## 2017-04-29 LAB — ABO/RH: ABO/RH(D): A POS

## 2017-04-29 LAB — PREPARE RBC (CROSSMATCH)

## 2017-04-29 MED ORDER — HYDROMORPHONE HCL 1 MG/ML IJ SOLN
1.0000 mg | Freq: Once | INTRAMUSCULAR | Status: AC
  Administered 2017-04-29: 1 mg via INTRAVENOUS
  Filled 2017-04-29: qty 1

## 2017-04-29 MED ORDER — POLYETHYLENE GLYCOL 3350 17 G PO PACK
17.0000 g | PACK | Freq: Every day | ORAL | Status: DC
  Administered 2017-05-02 – 2017-05-05 (×4): 17 g via ORAL
  Filled 2017-04-29 (×6): qty 1

## 2017-04-29 MED ORDER — "THROMBI-PAD 3""X3"" EX PADS"
MEDICATED_PAD | CUTANEOUS | Status: AC
  Filled 2017-04-29: qty 2

## 2017-04-29 MED ORDER — ONDANSETRON HCL 4 MG PO TABS
4.0000 mg | ORAL_TABLET | Freq: Four times a day (QID) | ORAL | Status: DC | PRN
  Administered 2017-05-05: 4 mg via ORAL
  Filled 2017-04-29: qty 1

## 2017-04-29 MED ORDER — GABAPENTIN 300 MG PO CAPS
600.0000 mg | ORAL_CAPSULE | Freq: Three times a day (TID) | ORAL | Status: DC
  Administered 2017-04-29 – 2017-05-07 (×23): 600 mg via ORAL
  Filled 2017-04-29 (×23): qty 2

## 2017-04-29 MED ORDER — MIRTAZAPINE 15 MG PO TABS
15.0000 mg | ORAL_TABLET | Freq: Every day | ORAL | Status: DC
  Administered 2017-04-29 – 2017-05-06 (×7): 15 mg via ORAL
  Filled 2017-04-29 (×7): qty 1

## 2017-04-29 MED ORDER — ACETAMINOPHEN 325 MG PO TABS
650.0000 mg | ORAL_TABLET | Freq: Four times a day (QID) | ORAL | Status: DC | PRN
  Administered 2017-04-30: 650 mg via ORAL
  Filled 2017-04-29: qty 2

## 2017-04-29 MED ORDER — ONDANSETRON HCL 4 MG/2ML IJ SOLN
4.0000 mg | Freq: Four times a day (QID) | INTRAMUSCULAR | Status: DC | PRN
  Administered 2017-05-02: 4 mg via INTRAVENOUS
  Filled 2017-04-29: qty 2

## 2017-04-29 MED ORDER — OXYCODONE HCL ER 10 MG PO T12A
10.0000 mg | EXTENDED_RELEASE_TABLET | Freq: Two times a day (BID) | ORAL | Status: DC
  Administered 2017-04-29 – 2017-05-05 (×11): 10 mg via ORAL
  Filled 2017-04-29 (×11): qty 1

## 2017-04-29 MED ORDER — OXYCODONE-ACETAMINOPHEN 5-325 MG PO TABS
2.0000 | ORAL_TABLET | Freq: Four times a day (QID) | ORAL | Status: DC | PRN
  Administered 2017-04-29 – 2017-04-30 (×3): 2 via ORAL
  Filled 2017-04-29 (×3): qty 2

## 2017-04-29 MED ORDER — DOCUSATE SODIUM 100 MG PO CAPS
100.0000 mg | ORAL_CAPSULE | Freq: Two times a day (BID) | ORAL | Status: DC
  Administered 2017-04-29 – 2017-05-05 (×12): 100 mg via ORAL
  Filled 2017-04-29 (×15): qty 1

## 2017-04-29 MED ORDER — TRANEXAMIC ACID 1000 MG/10ML IV SOLN
500.0000 mg | Freq: Once | INTRAVENOUS | Status: DC
  Filled 2017-04-29: qty 10

## 2017-04-29 MED ORDER — ACETAMINOPHEN 650 MG RE SUPP: 650.0000 mg | Freq: Four times a day (QID) | RECTAL | Status: DC | PRN

## 2017-04-29 MED ORDER — "THROMBI-PAD 3""X3"" EX PADS"
MEDICATED_PAD | CUTANEOUS | Status: AC
  Filled 2017-04-29: qty 3

## 2017-04-29 MED ORDER — SODIUM CHLORIDE 0.9 % IV SOLN
10.0000 mL/h | Freq: Once | INTRAVENOUS | Status: AC
  Administered 2017-04-29: 10 mL/h via INTRAVENOUS

## 2017-04-29 NOTE — ED Triage Notes (Addendum)
Pt here for a wound below left breast that is bleeding.  EMS picked pt up from [facility].  Staff states they are not taking the pt back due to medicare running out.  Pt also refused to go to Hospice per EMS.

## 2017-04-29 NOTE — ED Notes (Signed)
Blood is not ready in lab yet. They will call once ready

## 2017-04-29 NOTE — H&P (Signed)
History and Physical    Ann Oliver [id number] DOB: Jun 13, 1955 DOA: 04/29/2017  PCP: [facility] Consultants:  [name] - oncology Patient coming from:  Discharged from [facility] yesterday to SNF at [facility] - she is essentially homeless, per SW report; NOK:   Juliane Lack, [place]  Chief Complaint: bleeding from breast  HPI: Ann Oliver is a 61 y.o. female with medical history significant of metastatic fungating breast mass currently on palliative chemotherapy, chronic anemia, malnutrition, depression, with bilateral lower extremity edema presenting with bleeding from her breast.  She has a fungating breast mass and was admitted to [facility] for this issue from 11/23-12/5. She was transfused and discharged and was sent to [facility] yesterday after the patient declined inpatient Hospice.  She was evaluated by psychiatry during her prior hospitalization and was determined to have capacity.  She is a very poor historian and refused to talk to me about anything other than the bleeding from her breast today.  She reports that she had her dressing changed at [facility] and she had ongoing bleeding and so they sent her in to be checked.  When asked about the status of her cancer, she reports that she was taking chemotherapy and "it went down, it went flat, so I don't know."   ED Course:  Acute blood loss anemia from bleeding fungating breast mass.  Discharged from the hospital to SNF yesterday and returning today with recurrent bleeding from the mass.  Hgb 8.7 to 6.7.    Review of Systems: As per HPI; otherwise review of systems reviewed and negative.   Ambulatory Status:  Ambulates without assistance  Past Medical History:  Diagnosis Date  . Breast cancer ([place])   . Sinusitis     Past Surgical History:  Procedure Laterality Date  . BIOPSY BREAST    . IR FLUORO GUIDE CV LINE RIGHT  11/06/2016  . IR US GUIDE VASC ACCESS RIGHT  11/06/2016    Social History   Socioeconomic History  .  Marital status: Single    Spouse name: Not on file  . Number of children: Not on file  . Years of education: Not on file  . Highest education level: Not on file  Social Needs  . Financial resource strain: Not on file  . Food insecurity - worry: Not on file  . Food insecurity - inability: Not on file  . Transportation needs - medical: Not on file  . Transportation needs - non-medical: Not on file  Occupational History  . Occupation: unemployed  Tobacco Use  . Smoking status: Never Smoker  . Smokeless tobacco: Never Used  Substance and Sexual Activity  . Alcohol use: No  . Drug use: No  . Sexual activity: Not on file  Other Topics Concern  . Not on file  Social History Narrative  . Not on file    Allergies  Allergen Reactions  . Aspirin Palpitations    Family History  Family history unknown: Yes    Prior to Admission medications   Medication Sig Start Date End Date Taking? Authorizing Provider  ANUCORT-HC 25 MG suppository unwrap and insert 1 suppository rectally twice a day if needed for HEMORRHOIDS OR ANAL ITCHING 04/12/17  Yes Causey, Charlestine Massed, NP  gabapentin (NEURONTIN) 600 MG tablet Take 1 tablet (600 mg total) 3 (three) times daily by mouth. 04/05/17  Yes Nicholas Lose, MD  mirtazapine (REMERON) 15 MG tablet Take 15 mg by mouth at bedtime.   Yes [provider]  ondansetron [facility])  8 MG tablet take 1 tablet by mouth every 8 hours if needed for nausea and vomiting 04/13/17  Yes Causey, Charlestine Massed, NP  oxyCODONE (OXYCONTIN) 10 mg 12 hr tablet Take 1 tablet (10 mg total) by mouth every 12 (twelve) hours. 04/28/17  Yes Rosita Fire, MD  oxyCODONE-acetaminophen (PERCOCET/ROXICET) 5-325 MG tablet Take 2 tablets by mouth every 6 (six) hours as needed for severe pain. 04/28/17  Yes Rosita Fire, MD  polyethylene glycol [facility] Floria Raveling) packet Take 17 g by mouth daily. 04/28/17  Yes Rosita Fire, MD  feeding supplement, ENSURE  ENLIVE, (ENSURE ENLIVE) LIQD Take 237 mLs by mouth 3 (three) times daily between meals. Patient not taking: Reported on 04/16/2017 09/26/16   [name], MD  mirtazapine (REMERON SOL-TAB) 15 MG disintegrating tablet Take 1 tablet (15 mg total) by mouth at bedtime. Patient not taking: Reported on 04/16/2017 03/24/17   Bonnielee Haff, MD    Physical Exam: Vitals:   04/29/17 1410 04/29/17 1411 04/29/17 1641  BP: 133/80  103/69  Pulse: 95  75  Resp: (!) 24  14  Temp: 98.3 F (36.8 C)    SpO2: 100%  98%  Weight:  54 kg (119 lb)   Height:  5\' 2"  (1.575 m)      General:  Appears calm and comfortable and is NAD; she is cachectic and frail with possible intellectual disability leading to poor historian Eyes:  PERRL, EOMI, normal lids, iris ENT:  grossly normal hearing, lips & tongue, mmm Neck:  no LAD, masses or thyromegaly Cardiovascular:  RRR, no m/r/g. No LE edema.  Respiratory:   CTA bilaterally with no wheezes/rales/rhonchi.  Normal respiratory effort. Abdomen:  soft, NT, ND, NABS Skin: breasts tightly wrapped by Dr. Oleta Mouse and so they were not undressed due to risk of recurrent bleeding.  Per Dr. Oleta Mouse: "Large fungating left breast mass, when exposed with multiple areas of arteriole bleeding." Musculoskeletal:  grossly normal tone BUE/BLE, good ROM, no bony abnormality Psychiatric: flat mood and affect, speech fluent but tangential and difficulty to understand Neurologic:  CN 2-12 grossly intact, moves all extremities in coordinated fashion, sensation intact    Radiological Exams on Admission: No results found.  EKG: Not done   Labs on Admission: I have personally reviewed the available labs and imaging studies at the time of the admission.  Pertinent labs:   Glucose 136 WBC 16.5, unchanged from yesterday Hgb 6.7, down from 8.7 yesterday Platelets 603, up from 590 yesterday  Assessment/Plan Principal Problem:   Breast cancer of upper-outer quadrant of left female breast  (HCC) Active Problems:   Malnutrition of moderate degree   Blood loss anemia   Breast cancer -Patient with metastatic breast cancer with extensive liver and LN mets and a fungating tumor in the left chest wall and axilla.  Her prognosis is extremely poor. -Interestingly, there is a consult note from 11/30 from [name] that says that the patient is willing to go to Hospice and it at least implies that she will be on a comfort care status. -Whether the patient did not understand this conversation (there are concerns about her cognitive capacity); whether she is in such strong denial as to have forgotten the conversation; or whether she has changed her mind is unclear -Today, she requests to have everything possible done including full code and she does not appear to be interested in Hospice. -Instead, she voices desire to return to her home with home health services. -There is  a note from SW today in the chart indicating that she is essentially homeless.   -When asked about NOK, she vaguely listed "family" but then was unable to name any specific family members.  She finally listed her sister, who might live in [place] and for whom she does not have a telephone number.  But then she also said that she has various family members living in her home at any given time.   -With such a serious medical issue, it would be helpful to further clarify her family and living situations. -Palliative care and SW consults have been ordered.  Blood loss anemia -The patient does have active blood loss from her fungating breast mass. -This has been addressed by Dr. Oleta Mouse and the patient is currently bandaged with C/D/I dressings. -Meanwhile, she was previously admitted for acute blood loss anemia.   -She was transfused, but her hemoglobin again fell to 5.1 on 12/4. -She was transfused 2 additional units on 12/4. -Her Hgb would be expected to rise about 2 points from that transfusion, but her f/u Hgb was 8.7  on 12/5 and so she was discharged. -Today's Hgb is 6.7. -While it is possible that her bleeding led to today's Hgb, it is also possible that she simply had not yet equilibrated the blood that she received on 12/4 by the time that 0500 CBC was checked.  Her Hgb would have been expected to be about 7.1 and so drifting down to 6.7 would be less concerning in that scenario. -For now, will transfuse the unit ordered by Dr. Oleta Mouse but will continue to follow without ordering additional units at this time.  Malnutrition -Severe protein-calorie malnutrition -Her Albumin on 11/29 was 1.7 -This, in and of itself, is a hospice-qualifying criteria -Will once again order a nutrition consult   DVT prophylaxis:  SCDs Code Status:  Full - confirmed with patient Family Communication: None present Disposition Plan:  To be determined Consults called: Palliative Care, SW, Nutrition Admission status: Admit - It is my clinical opinion that admission to INPATIENT is reasonable and necessary because this patient will require at least 2 midnights in the hospital to treat this condition based on the medical complexity of the problems presented.  Given the aforementioned information, the predictability of an adverse outcome is felt to be significant.    Karmen Bongo MD [facility]  If note is complete, please contact covering daytime or nighttime physician. [email or web address] Password [facility]  04/29/2017, 6:37 PM

## 2017-04-29 NOTE — ED Provider Notes (Signed)
[facility] EMERGENCY DEPARTMENT Provider Note   CSN: [id number] Arrival date & time: 04/29/17  1406     History   Chief Complaint Chief Complaint  Patient presents with  . Wound Check    HPI Ann Oliver is a 61 y.o. female.  HPI 61 year old female who presents with wound check.  She has a history of metastatic breast cancer with known left fungating breast mass.  She was just discharged from the hospital yesterday to skilled nursing facility after blood transfusion for bleeding out of her breast mass.  EMS brought patient from facility today due to reported bleeding from her breast mass.  Initially staff stated that they are not taking the patient back due to Medicare running out.  Patient also has refused to go to hospice at this time.  Aside from notable bleeding from her wounds, she denies any fever, chills, vomiting, purulent drainage, syncope or near syncope, difficulty breathing or extreme fatigue. Past Medical History:  Diagnosis Date  . Cancer ([place])   . Sinusitis   . Tumor cells     Patient Active Problem List   Diagnosis Date Noted  . Ascites   . Evaluation by psychiatric service required 04/22/2017  . AKI (acute kidney injury) ([place]) 03/26/2017  . Leg swelling 03/26/2017  . Diarrhea 03/26/2017  . Leukocytosis 03/26/2017  . Anemia 03/26/2017  . Acute bleeding 03/18/2017  . Breast wound 03/18/2017  . Blood loss anemia 01/18/2017  . Port catheter in place 12/02/2016  . Necrotizing soft tissue infection 11/21/2016  . Cellulitis 10/28/2016  . Wound infection 10/28/2016  . Cellulitis of left breast 10/28/2016  . Encounter for palliative care   . Goals of care, counseling/discussion   . Metastatic breast cancer ([place]) 09/25/2016  . Malnutrition of moderate degree 09/25/2016  . Breast cancer of upper-outer quadrant of left female breast ([place]) 10/22/2015  . THYROID NODULE, RIGHT 05/31/2007  . Depressed 05/31/2007  . GERD 05/31/2007  . HOT FLASHES 05/31/2007    . HEADACHE 05/31/2007    Past Surgical History:  Procedure Laterality Date  . BIOPSY BREAST    . IR FLUORO GUIDE CV LINE RIGHT  11/06/2016  . IR US GUIDE VASC ACCESS RIGHT  11/06/2016    OB History    No data available       Home Medications    Prior to Admission medications   Medication Sig Start Date End Date Taking? Authorizing Provider  ANUCORT-HC 25 MG suppository unwrap and insert 1 suppository rectally twice a day if needed for HEMORRHOIDS OR ANAL ITCHING 04/12/17  Yes Causey, Charlestine Massed, NP  gabapentin (NEURONTIN) 600 MG tablet Take 1 tablet (600 mg total) 3 (three) times daily by mouth. 04/05/17  Yes Nicholas Lose, MD  mirtazapine (REMERON) 15 MG tablet Take 15 mg by mouth at bedtime.   Yes [provider]  ondansetron (ZOFRAN) 8 MG tablet take 1 tablet by mouth every 8 hours if needed for nausea and vomiting 04/13/17  Yes Causey, Charlestine Massed, NP  oxyCODONE (OXYCONTIN) 10 mg 12 hr tablet Take 1 tablet (10 mg total) by mouth every 12 (twelve) hours. 04/28/17  Yes Rosita Fire, MD  oxyCODONE-acetaminophen (PERCOCET/ROXICET) 5-325 MG tablet Take 2 tablets by mouth every 6 (six) hours as needed for severe pain. 04/28/17  Yes Rosita Fire, MD  polyethylene glycol [facility] Floria Raveling) packet Take 17 g by mouth daily. 04/28/17  Yes Rosita Fire, MD  feeding supplement, ENSURE ENLIVE, (ENSURE ENLIVE) LIQD Take 237 mLs  by mouth 3 (three) times daily between meals. Patient not taking: Reported on 04/16/2017 09/26/16   [name], MD  mirtazapine (REMERON SOL-TAB) 15 MG disintegrating tablet Take 1 tablet (15 mg total) by mouth at bedtime. Patient not taking: Reported on 04/16/2017 03/24/17   Bonnielee Haff, MD    Family History Family History  Problem Relation Age of Onset  . Breast cancer Neg Hx     Social History Social History   Tobacco Use  . Smoking status: Never Smoker  . Smokeless tobacco: Never Used  Substance Use  Topics  . Alcohol use: No  . Drug use: No     Allergies   Aspirin   Review of Systems Review of Systems  Constitutional: Negative for fatigue and fever.  Respiratory: Negative for shortness of breath.   Gastrointestinal: Negative for diarrhea and vomiting.  All other systems reviewed and are negative.    Physical Exam Updated Vital Signs BP 103/69 (BP Location: Right Arm)   Pulse 75   Temp 98.3 F (36.8 C)   Resp 14   Ht 5\' 2"  (1.575 m)   Wt 54 kg (119 lb)   SpO2 98%   BMI 21.77 kg/m   Physical Exam Physical Exam  Nursing note and vitals reviewed. Constitutional: Cachectic, chronically ill appearing, non-toxic, and in no acute distress Head: Normocephalic and atraumatic.  Mouth/Throat: Oropharynx is clear and moist.  Neck: Normal range of motion. Neck supple.  Cardiovascular: Normal rate and regular rhythm.   Pulmonary/Chest: Effort normal and breath sounds normal. Large fungating left breast mass, when exposed with multiple areas of arteriole bleeding.  Abdominal: Soft. There is no tenderness. There is no rebound and no guarding.  Musculoskeletal: Normal range of motion.  Neurological: Alert, no facial droop, fluent speech, moves all extremities symmetrically Skin: Skin is warm and dry.  Psychiatric: Cooperative   ED Treatments / Results  Labs (all labs ordered are listed, but only abnormal results are displayed) Labs Reviewed  CBC WITH DIFFERENTIAL/PLATELET - Abnormal; Notable for the following components:      Result Value   WBC 16.5 (*)    RBC 2.39 (*)    Hemoglobin 6.7 (*)    HCT 21.4 (*)    RDW 15.8 (*)    Platelets 603 (*)    Neutro Abs 13.6 (*)    Monocytes Absolute 1.3 (*)    All other components within normal limits  BASIC METABOLIC PANEL - Abnormal; Notable for the following components:   Glucose, Bld 136 (*)    Calcium 8.1 (*)    All other components within normal limits  TYPE AND SCREEN  PREPARE RBC (CROSSMATCH)  ABO/RH    EKG  EKG  Interpretation None       Radiology No results found.  Procedures Procedures (including critical care time) CRITICAL CARE Performed by: Forde Dandy   Total critical care time: 40 minutes  Critical care time was exclusive of separately billable procedures and treating other patients.  Critical care was necessary to treat or prevent imminent or life-threatening deterioration.  Critical care was time spent personally by me on the following activities: development of treatment plan with patient and/or surrogate as well as nursing, discussions with consultants, evaluation of patient's response to treatment, examination of patient, obtaining history from patient or surrogate, ordering and performing treatments and interventions, ordering and review of laboratory studies, ordering and review of radiographic studies, pulse oximetry and re-evaluation of patient's condition.  Medications Ordered in ED Medications  THROMBI-PAD 3"X3" pad (not administered)  THROMBI-PAD 3"X3" pad (not administered)  0.9 %  sodium chloride infusion (not administered)  HYDROmorphone (DILAUDID) injection 1 mg (1 mg Intravenous Given 04/29/17 1557)  HYDROmorphone (DILAUDID) injection 1 mg (1 mg Intravenous Given 04/29/17 1705)     Initial Impression / Assessment and Plan / ED Course  I have reviewed the triage vital signs and the nursing notes.  Pertinent labs & imaging results that were available during my care of the patient were reviewed by me and considered in my medical decision making (see chart for details).     61 year old female with metastatic breast cancer and known fungating left breast mass who presents for wound check.  Hemodynamically is stable, but upon removing dressing over mass, she has multiple areas of small arterial bleeding. with pressure, thrombotic pad dressing, and hemostatic gauze, bleeding was controlled.  She does have worsening anemia with hemoglobin of 6.7.  Her hemoglobin was 8.7  yesterday.  Will transfuse a unit of blood and admit for ongoing monitoring. Discussed with Dr. Lorin Mercy     Final Clinical Impressions(s) / ED Diagnoses   Final diagnoses:  Acute blood loss anemia  Breast mass  Hemorrhage from wound    ED Discharge Orders    None       Forde Dandy, MD 04/29/17 1722

## 2017-04-29 NOTE — ED Notes (Signed)
HGB-6.7. EDP notified.

## 2017-04-29 NOTE — Clinical Social Work Note (Signed)
Patient is from [facility] and can return when she is appropriate for discharge. Patient is essentially homeless per Joaquim Nam at [facility] and has no other plan other than returning to the facility.   LCSW signing off.    Nakia Koble, Clydene Pugh, LCSW

## 2017-04-29 NOTE — ED Notes (Signed)
Pt has a large breast wound to left breast per [facility]. This nurse did not remove all of the pressure dressing. Will wait until doctor is in there to examine wound with doctor

## 2017-04-30 DIAGNOSIS — D62 Acute posthemorrhagic anemia: Secondary | ICD-10-CM

## 2017-04-30 DIAGNOSIS — T148XXA Other injury of unspecified body region, initial encounter: Secondary | ICD-10-CM

## 2017-04-30 DIAGNOSIS — R58 Hemorrhage, not elsewhere classified: Secondary | ICD-10-CM

## 2017-04-30 LAB — CBC
HEMATOCRIT: 21.7 % — AB (ref 36.0–46.0)
HEMOGLOBIN: 7.1 g/dL — AB (ref 12.0–15.0)
MCH: 29.3 pg (ref 26.0–34.0)
MCHC: 32.7 g/dL (ref 30.0–36.0)
MCV: 89.7 fL (ref 78.0–100.0)
Platelets: 455 10*3/uL — ABNORMAL HIGH (ref 150–400)
RBC: 2.42 MIL/uL — ABNORMAL LOW (ref 3.87–5.11)
RDW: 15.2 % (ref 11.5–15.5)
WBC: 16.6 10*3/uL — ABNORMAL HIGH (ref 4.0–10.5)

## 2017-04-30 LAB — BASIC METABOLIC PANEL
ANION GAP: 6 (ref 5–15)
BUN: 18 mg/dL (ref 6–20)
CHLORIDE: 101 mmol/L (ref 101–111)
CO2: 27 mmol/L (ref 22–32)
Calcium: 8 mg/dL — ABNORMAL LOW (ref 8.9–10.3)
Creatinine, Ser: 0.89 mg/dL (ref 0.44–1.00)
GFR calc Af Amer: >60 mL/min (ref >60)
GLUCOSE: 98 mg/dL (ref 65–99)
POTASSIUM: 4.5 mmol/L (ref 3.5–5.1)
Sodium: 134 mmol/L — ABNORMAL LOW (ref 135–145)

## 2017-04-30 LAB — PREPARE RBC (CROSSMATCH)

## 2017-04-30 LAB — MRSA PCR SCREENING: MRSA BY PCR: NEGATIVE

## 2017-04-30 LAB — LACTIC ACID, PLASMA: LACTIC ACID, VENOUS: 3.5 mmol/L — AB (ref 0.5–1.9)

## 2017-04-30 MED ORDER — SODIUM CHLORIDE 0.9 % IV BOLUS (SEPSIS)
1000.0000 mL | Freq: Once | INTRAVENOUS | Status: AC
Start: 2017-04-30 — End: 2017-04-30
  Administered 2017-04-30: 1000 mL via INTRAVENOUS

## 2017-04-30 MED ORDER — SODIUM CHLORIDE 0.9 % IV SOLN
Freq: Once | INTRAVENOUS | Status: AC
  Administered 2017-04-30: 16:00:00 via INTRAVENOUS

## 2017-04-30 MED ORDER — ENSURE ENLIVE PO LIQD
237.0000 mL | Freq: Two times a day (BID) | ORAL | Status: DC
  Administered 2017-04-30 – 2017-05-04 (×4): 237 mL via ORAL

## 2017-04-30 MED ORDER — OXYCODONE-ACETAMINOPHEN 5-325 MG PO TABS
2.0000 | ORAL_TABLET | ORAL | Status: DC | PRN
  Administered 2017-04-30 – 2017-05-07 (×30): 2 via ORAL
  Filled 2017-04-30 (×30): qty 2

## 2017-04-30 MED ORDER — POTASSIUM CHLORIDE CRYS ER 20 MEQ PO TBCR: 40.0000 meq | EXTENDED_RELEASE_TABLET | Freq: Once | ORAL | Status: DC

## 2017-04-30 NOTE — Progress Notes (Signed)
Initial Nutrition Assessment  DOCUMENTATION CODES:   Non-severe (moderate) malnutrition in context of chronic illness  INTERVENTION:  Ensure Enlive po BID, each supplement provides 350 kcal and 20 grams of protein   Nutrition education as indicated   Multivitiamin   NUTRITION DIAGNOSIS:   Moderate Malnutrition related to cancer and cancer related treatments as evidenced by moderate fat depletion, moderate muscle depletion.  GOAL:  Pt to meet >/= 90% of their estimated nutrition needs    MONITOR:   PO intake, Supplement acceptance, Weight trends, Skin, Labs  REASON FOR ASSESSMENT:   Consult Assessment of nutrition requirement/status  ASSESSMENT:  61yo female with hx of metastatci breast cancer. Presents with bleeding from mass. She has chronic anemia, and malnutrition and palliative chemotherapy. Pt assessed by RD at [facility] on 11/25. Patient declined to have mastectomy and debulking of tumor offered to pt on  multiple occasions. Tumor massive and necrotic per surgical assessment and pictorial documentation.  Palliative note 11/28 pt prognosis < 4 weeks.   The patient is eating well >75% of meals. She ambulates independently and follows a regular diet at home. She prepares her food and sometimes family/friends bring in meals. The pt likes strawberry flavor Ensure.  Her weight has been fluctuating between 46-56 kg over the past 90 days. The patient says "sometimes I have fluid". Paracentesis was discussed during her hospital stay at [facility] but not done per MD note. No hx of CHF or edema to lower extremities today.    NUTRITION - FOCUSED PHYSICAL EXAM:    Most Recent Value  Orbital Region  Mild depletion  Upper Arm Region  Moderate depletion  Buccal Region  Mild depletion  Temple Region  Mild depletion  Clavicle Bone Region  Moderate depletion  Clavicle and Acromion Bone Region  Moderate depletion  Dorsal Hand  Moderate depletion  Patellar Region  Mild depletion  Posterior Calf  Region  Mild depletion  Edema (RD Assessment)  None  Hair  Reviewed  Eyes  Reviewed  Mouth  Reviewed  Skin  Reviewed  Nails  Reviewed      Diet Order:  Diet regular Room service appropriate? Yes; Fluid consistency: Thin  EDUCATION NEEDS:   Education needs have been addressed  Skin:  Skin Assessment: Skin Integrity Issues: Other: open wound on left breast (non-pressure)  Last BM:  12/6  Height:   Ht Readings from Last 1 Encounters:  04/29/17 5\' 2"  (1.575 m)    Weight:   Wt Readings from Last 1 Encounters:  04/29/17 119 lb (54 kg)    Ideal Body Weight:  50 kg  BMI:  Body mass index is 21.77 kg/m.  Estimated Nutritional Needs:   Kcal:  8676-1950   Protein:  70-76 gr  Fluid:  >1.6 liters daily  Colman Cater MS,RD,CSG,LDN Office: [phone] Pager: [phone]

## 2017-04-30 NOTE — Care Management Note (Signed)
Case Management Note  Patient Details  Name: Ann Oliver MRN: [id number] Date of Birth: March 23, 1956        Admitted with anemia after dressing was changed at [facility]. Pt has large wound to left breast that bleeds uncontrollably with dressing changes. She was recently discharged from [facility] to [facility] and was supposed to have Hospice services through [facility], however, per Hospice rep, pt declines services and did not meet their criteria as pt wants to cont to receive blood and does not agree to DNR. It has been rumored that pt is homeless and that is why she was main reason for SNF placement at last DC. Pt gives address for her home in [place] on [address]. CM has received chart and pt was active with [facility] prior to her last admission. CM contacted Katrina, [facility] rep, who says pt does have a home, they made visits there and will not accept her back into their services do the massive bleeding that occurs with each dressing change.  The risk of bleeding out is too great for them to take on the responsibility of doing these in the home. Pt also found to have capacity during last admission. During this CM's assessment the patient was difficulty to understand at times but conversation was appropriate. Pt planned to DC home and go to the ED for each dressing change. She has plan for transportation. CM discussed pt's plan to go home with MD. MD inquires about pt being seen in the [place]. Cm contacted wound center, they will not be able to see pt 3 times a week but could possibly see pt twice a week. They will have to discuss with their clinical manager, they have cared for patients wound in the past. MD plans to DC (once stable) pt if acceptance to wound center can be confirmed.  CM waiting for call back form Wound Center.   Expected Discharge Date:     05/01/2017             Expected Discharge Plan:  Home/Self Care  In-House Referral:  Clinical Social Work  Discharge planning Services   CM Consult  Post Acute Care Choice:  NA Choice offered to:  NA  Status of Service:  In process, will continue to follow  If discussed at Long Length of Stay Meetings, dates discussed:    Additional Comments:  Sherald Barge, RN 04/30/2017, 2:51 PM

## 2017-04-30 NOTE — Progress Notes (Signed)
Palliative Medicine RN Note: Consult order noted. PMT provider will return to [facility] on Monday 12/10, so patient will be evaluated then. Patient was seen multiple times over the last week (11/28-12/1) last week by PMT team (Dr Odette Fraction). Please see her notes for details of those encounters.  Marjie Skiff Sonnie Pawloski, RN, BSN, [facility] 04/30/2017 9:23 AM Cell [phone] 8:00-4:00 Monday-Friday Office [phone]

## 2017-04-30 NOTE — Clinical Social Work Note (Signed)
Clinical Social Work Assessment  Patient Details  Name: Catilyn Oliver MRN: [id number] Date of Birth: 11-11-1955  Date of referral:  04/30/17               Reason for consult:  Discharge Planning                Permission sought to share information with:    Permission granted to share information::  No  Name::        Agency::     Relationship::     Contact Information:     Housing/Transportation Living arrangements for the past 2 months:    Source of Information:    Patient Interpreter Needed:    Criminal Activity/Legal Involvement Pertinent to Current Situation/Hospitalization:    Significant Relationships:    Lives with:    Do you feel safe going back to the place where you live?  Yes Need for family participation in patient care:  No (Coment)  Care giving concerns: Pt lives alone and she won't allow SW to contact any family.    Social Worker assessment / plan: Pt is a 61 year old female admitted with bleeding from her breast. Reviewed pt's record today. Pt was recently discharged from [facility]. Pt discharged to [place] with [facility] care for dressing changes. SW notified by RN that pt asking to see SW as she wanted to leave AMA. Met with pt this AM to assess. Pt was frustrated because she was having pain and wasn't receiving her medication on time. Apparently there was a critical situation with another patient and pt's RN was late getting pt her pain medication. Pt reports to LCSW that she is feeling better now and she will stay here. Pt does inform SW that she does not want to go back to [facility] at dc. She states that she did not know how far away it was from [place] when they set it up for her at [facility] and she has no family or anyone she knows near here. Reviewed with pt the discussions she had with SW at [facility] and other care needs she has. Also reviewed with pt that she had indicated that she was concerned about basic needs of  clothing and food. Pt now states that this is not a concern. Pt states that she has her own apartment and she is not homeless. Pt stating that she does not know how it got into her record that she is homeless. Pt gives her address as [address] in [place]. Unable to confirm if this is actually pt's apartment. Pt states that her son sometimes stays with her but otherwise she lives alone. Pt states she does not use any DME at home. It does not appear that pt has a realistic understanding of her needs at this time. Assessment at [facility] found pt to be competent to make her own decisions. Pt does appear competent. Again, she does not appear realistic. Attempted to encourage pt to go back to [facility] multiple times. Pt continuously states that she is going home at dc. Pt is agreeable to [facility]. DC timeframe unknown. Discussed with RN CM who will follow up about [facility]. Discussed with MD who states pt is not medically stable for dc yet but that he does not feel it's a safe dc plan for pt to return "home". If pt is competent, we cannot keep her from going home if that is her choice. LCSW will  continue to follow and assist with return to [facility] if pt will agree. Can refer to APS if pt does go home and is assessed to be unsafe.  Employment status:  Disabled (Comment on whether or not currently receiving Disability) Insurance information:  Medicaid In [place] PT Recommendations:  Not assessed at this time Information / Referral to community resources:     Patient/Family's Response to care: Pt is accepting of care.  Patient/Family's Understanding of and Emotional Response to Diagnosis, Current Treatment, and Prognosis: Pt does not appear to have a realistic understanding of her diagnosis, treatment plan, or prognosis. She appears to either forget or be in denial about the advancement of her disease process.  Emotional Assessment Appearance:  Appears stated age Attitude/Demeanor/Rapport:    Affect  (typically observed):  Apprehensive Orientation:  Oriented to Self, Oriented to Place, Oriented to  Time, Oriented to Situation Alcohol / Substance use:  Not Applicable Psych involvement (Current and /or in the community):  No (Comment)  Discharge Needs  Concerns to be addressed:  Decision making concerns, Discharge Planning Concerns Readmission within the last 30 days:  Yes Current discharge risk:  Lack of support system, Terminally ill, Lives alone Barriers to Discharge:  Other   Shade Flood, LCSW 04/30/2017, 12:19 PM

## 2017-04-30 NOTE — Care Management (Signed)
CM called back by [facility] who have agreed to see patient once a week. First appointment made for next Friday 05/07/17 at 2pm. MD has consulted general surgery. [facility] has refused to take patient back. [facility] will not see patient as they feel they could not adequately care for her. CM has asked [facility] rep, Vaughan Basta to view pt info, she will review chart and discuss with her manager and surgeon to discuss whether or not they can care for in the home safely.

## 2017-04-30 NOTE — Progress Notes (Addendum)
PROGRESS NOTE  Ann Oliver INO:[id number] DOB: [birth year] DOA: 04/29/2017 PCP: [facility]  Brief History:  61 year old female with a history of metastatic breast cancer, malnutrition, and blood loss anemia presenting from [facility] due to bleeding from her left fungating breast mass.  The patient has had numerous hospital admissions in the last 6 months secondary to bleeding from her left-sided breast mass.  The patient was sent to the emergency department because of concerns for bleeding from her wound site.  most recently, the patient was admitted to [facility] secondary to bleeding from her breast mass.  The patient was seen by general surgery, the wound care team, palliative medicine, psychiatry, and her medical oncologist [name]).  The patient was deemed to have capacity to make medical decisions.  She wished to continue the full scope of care.  She required complex wound care by the wound care team and the general surgical team.  Most recently, the patient was transfused 2 units PRBC on April 27, 2017.  After, the patient was ultimately discharged to stabilization [facility].  The patient is a poor historian and very tangential, but it is been noted that the patient is essentially homeless.  Recent CT of the abdomen and pelvis on April 20, 2017 largely showed progression of her metastatic disease with enlargement of her chest wall mass and increase in left subpectoral lymph nodes.  There was also progression of her pulmonary metastasis.  She was noted to have hemoglobin 6.7, but she remained hemodynamically stable.  Assessment/Plan: Metastatic breast cancer--invasive adenocarcinoma  -diagnosed in May 2017 -Medical oncologist is Dr. Nicholas Lose -Last chemotherapy March 15, 2017 -April 20, 2017- CTabd/pelvis shows progression of metastatic disease -Previously seen by palliative medicine last admission--> refused hospice; continue full scope of care -Overall  poor prognosis--[name] recommends hospice with comfort measures  Acute blood loss anemia -Secondary to fungating left breast mass -Baseline hemoglobin 7-8 -transfused 2 units PRBC 12/4 and one unit 12/6  -transfuse an additional unit PRBC -As documented by multiple specialists on the patient's medical care team--patient will continue to have high risk of rebleeding -Monitor serial CBC -Consult general surgery  Moderate Malnutrition -Nutrition consult  Cancer related pain -Continue OxyContin and Percocet  leukocytosis -The patient is afebrile hemodynamically stable -Check lactic acid -Remain off antibiotics and monitor clinically -Likely stress demargination -UA   Disposition Plan:  SNF when stable  Family Communication:  No Family at bedside--Total time spent 40 minutes.  Greater than 50% spent face to face counseling and coordinating care.   Consultants:  General surgery  Code Status:  FULL   DVT Prophylaxis:  SCDs   Procedures: As Listed in Progress Note Above  Antibiotics: None    Subjective: Patient denies fevers, chills, headache, chest pain, dyspnea, nausea, vomiting, diarrhea, abdominal pain, dysuria, hematuria, hematochezia, and melena.   Objective: Vitals:   04/29/17 2000 04/29/17 2020 04/29/17 2244 04/30/17 0546  BP: 112/66 101/60 107/64 100/60  Pulse: (!) 116 (!) 115 100 (!) 106  Resp: 14 16 20 20   Temp: 98.2 F (36.8 C) 98.5 F (36.9 C) 98.4 F (36.9 C) 98.5 F (36.9 C)  TempSrc: Oral Oral Oral Oral  SpO2: 100% 100% 100% 98%  Weight:      Height:        Intake/Output Summary (Last 24 hours) at 04/30/2017 0737 Last data filed at 04/30/2017 0458 Gross per 24 hour  Intake 490.83 ml  Output -  Net  490.83 ml   Weight change:  Exam:   General:  Pt is alert, follows commands appropriately, not in acute distress  HEENT: No icterus, No thrush, No neck mass, [place]/AT  Cardiovascular: RRR, S1/S2, no rubs, no gallops  Respiratory: CTA  bilaterally, no wheezing, no crackles, no rhonchi  Abdomen: Soft/+BS, non tender, non distended, no guarding  Extremities: trace LE edema, No lymphangitis, No petechiae, No rashes, no synovitis  Chest wall--pressure dressing in place with dried blood noted on the periphery of the dressing.   Data Reviewed: I have personally reviewed following labs and imaging studies Basic Metabolic Panel: Recent Labs  Lab 04/27/17 0554 04/29/17 1542  NA 136 136  K 4.3 5.0  CL 102 102  CO2 29 26  GLUCOSE 95 136*  BUN 16 19  CREATININE 0.88 0.92  CALCIUM 8.2* 8.1*   Liver Function Tests: No results for input(s): AST, ALT, ALKPHOS, BILITOT, PROT, ALBUMIN in the last 168 hours. No results for input(s): LIPASE, AMYLASE in the last 168 hours. No results for input(s): AMMONIA in the last 168 hours. Coagulation Profile: No results for input(s): INR, PROTIME in the last 168 hours. CBC: Recent Labs  Lab 04/25/17 0434 04/27/17 0554 04/28/17 0500 04/29/17 1542 04/30/17 0634  WBC 11.4* 12.4* 16.5* 16.5* 16.6*  NEUTROABS  --   --   --  13.6*  --   HGB 7.3* 5.1* 8.7* 6.7* 7.1*  HCT 23.1* 16.1* 26.3* 21.4* 21.7*  MCV 86.8 85.6 86.5 89.5 89.7  PLT 629* 547* 590* 603* 455*   Cardiac Enzymes: No results for input(s): CKTOTAL, CKMB, CKMBINDEX, TROPONINI in the last 168 hours. BNP: Invalid input(s): POCBNP CBG: No results for input(s): GLUCAP in the last 168 hours. HbA1C: No results for input(s): HGBA1C in the last 72 hours. Urine analysis:    Component Value Date/Time   COLORURINE YELLOW 03/27/2017 1459   APPEARANCEUR CLEAR 03/27/2017 1459   LABSPEC 1.012 03/27/2017 1459   PHURINE 5.0 03/27/2017 1459   GLUCOSEU NEGATIVE 03/27/2017 1459   HGBUR NEGATIVE 03/27/2017 1459   BILIRUBINUR NEGATIVE 03/27/2017 1459   KETONESUR 5 (A) 03/27/2017 1459   PROTEINUR NEGATIVE 03/27/2017 1459   NITRITE NEGATIVE 03/27/2017 1459   LEUKOCYTESUR TRACE (A) 03/27/2017 1459   Sepsis  Labs: @LABRCNTIP (procalcitonin:4,lacticidven:4) ) Recent Results (from the past 240 hour(s))  MRSA PCR Screening     Status: None   Collection Time: 04/30/17 12:28 AM  Result Value Ref Range Status   MRSA by PCR NEGATIVE NEGATIVE Final    Comment:        The GeneXpert MRSA Assay (FDA approved for NASAL specimens only), is one component of a comprehensive MRSA colonization surveillance program. It is not intended to diagnose MRSA infection nor to guide or monitor treatment for MRSA infections.      Scheduled Meds: . docusate sodium  100 mg Oral BID  . gabapentin  600 mg Oral TID  . mirtazapine  15 mg Oral QHS  . oxyCODONE  10 mg Oral Q12H  . polyethylene glycol  17 g Oral Daily   Continuous Infusions:  Procedures/Studies: Ct Chest W Contrast  Result Date: 04/21/2017 CLINICAL DATA:  Breast cancer. Restaging. Non osseous recurrent suspected. EXAM: CT CHEST, ABDOMEN, AND PELVIS WITH CONTRAST TECHNIQUE: Multidetector CT imaging of the chest, abdomen and pelvis was performed following the standard protocol during bolus administration of intravenous contrast. CONTRAST:  136mL ISOVUE-300 IOPAMIDOL (ISOVUE-300) INJECTION 61%, 25mL ISOVUE-300 IOPAMIDOL (ISOVUE-300) INJECTION 61% COMPARISON:  Plain film 03/18/2017.  10/28/2016 CT.  FINDINGS: CT CHEST FINDINGS Cardiovascular: Aortic atherosclerosis. Mild cardiomegaly, without pericardial effusion. No central pulmonary embolism, on this non-dedicated study. Right-sided PICC line which terminates at the high right atrium. Mediastinum/Nodes: Left supraclavicular node of 9 mm on image 8/series 2 is similar. Well-circumscribed hypoattenuating right thyroid nodule is grossly similar and of doubtful clinical significance. left axillary nodes are decreased, with an index measuring 1.0 cm today versus 2.1 cm on the prior exam (when remeasured). A left subpectoral node measures 11 mm today versus 9 mm on the prior exam (when remeasured). No mediastinal or  hilar adenopathy. Lungs/Pleura: Trace bilateral pleural fluid, new. Bilateral pulmonary nodules consistent with metastasis. Index left upper lobe pulmonary nodule measures 1.4 cm on image 46/ series 4 versus 1.1 cm on the prior. An index right lower lobe pulmonary nodule measures 9 mm on image 88/series 4 versus 8 mm on the prior exam (when remeasured). Inferior lingular nodule measures 1.8 cm on image 84/ series 4 and is similar 1.9 cm on the prior. Subpleural left upper lobe 1.3 cm nodule on image 67/series 4 is new. Musculoskeletal: Left chest wall mass measures 15.2 x 6.2 by 17.2 cm today versus 11.4 x 8.3 cm by 14.7 on the prior exam (when remeasured). A mild compression deformity involving the superior endplate of U04 is not significantly changed. CT ABDOMEN PELVIS FINDINGS Hepatobiliary: Segment 2 hypoattenuating lesion measures 11 mm on image 46/ series 2 versus 16 mm on the prior. No new liver lesion. Normal gallbladder. Upper normal common duct size, 7 mm. No obstructive cause identified. Pancreas: Normal, without mass or ductal dilatation. Spleen: Normal in size, without focal abnormality. Adrenals/Urinary Tract: Normal adrenal glands. 2.1 cm right renal cyst. Normal left kidney. No hydronephrosis. Normal urinary bladder. Stomach/Bowel: Normal stomach, without wall thickening. Colonic stool burden suggests constipation. Normal small bowel. Vascular/Lymphatic: Normal caliber of the aorta and branch vessels. Necrotic porta hepatis node of 1.9 x 2.5 cm on image 60/series 2. Similar to minimally enlarged from 1.8 x 2.2 on the prior exam. No pelvic sidewall adenopathy. Reproductive: Suspect a small uterine fundal fibroid on image 102/series 2. No adnexal mass. Other: Mild pelvic floor laxity. No significant free fluid. Anasarca. Musculoskeletal: Probable degenerative sclerosis about the left sacroiliac joint, similar. IMPRESSION: 1. Mixed response, with overall disease progression. 2. Enlargement of left chest  wall mass with improvement in left axillary but slight increase in left subpectoral adenopathy. Similar left supraclavicular adenopathy. 3. Progression of pulmonary metastasis. 4. Improvement in isolated hepatic metastasis with enlargement of necrotic periportal adenopathy. 5. Small bilateral pleural effusions. 6.  Possible constipation. Electronically Signed   By: Abigail Miyamoto M.D.   On: 04/21/2017 10:04   Ct Abdomen Pelvis W Contrast  Result Date: 04/21/2017 CLINICAL DATA:  Breast cancer. Restaging. Non osseous recurrent suspected. EXAM: CT CHEST, ABDOMEN, AND PELVIS WITH CONTRAST TECHNIQUE: Multidetector CT imaging of the chest, abdomen and pelvis was performed following the standard protocol during bolus administration of intravenous contrast. CONTRAST:  135mL ISOVUE-300 IOPAMIDOL (ISOVUE-300) INJECTION 61%, 12mL ISOVUE-300 IOPAMIDOL (ISOVUE-300) INJECTION 61% COMPARISON:  Plain film 03/18/2017.  10/28/2016 CT. FINDINGS: CT CHEST FINDINGS Cardiovascular: Aortic atherosclerosis. Mild cardiomegaly, without pericardial effusion. No central pulmonary embolism, on this non-dedicated study. Right-sided PICC line which terminates at the high right atrium. Mediastinum/Nodes: Left supraclavicular node of 9 mm on image 8/series 2 is similar. Well-circumscribed hypoattenuating right thyroid nodule is grossly similar and of doubtful clinical significance. left axillary nodes are decreased, with an index measuring 1.0 cm today versus 2.1  cm on the prior exam (when remeasured). A left subpectoral node measures 11 mm today versus 9 mm on the prior exam (when remeasured). No mediastinal or hilar adenopathy. Lungs/Pleura: Trace bilateral pleural fluid, new. Bilateral pulmonary nodules consistent with metastasis. Index left upper lobe pulmonary nodule measures 1.4 cm on image 46/ series 4 versus 1.1 cm on the prior. An index right lower lobe pulmonary nodule measures 9 mm on image 88/series 4 versus 8 mm on the prior exam  (when remeasured). Inferior lingular nodule measures 1.8 cm on image 84/ series 4 and is similar 1.9 cm on the prior. Subpleural left upper lobe 1.3 cm nodule on image 67/series 4 is new. Musculoskeletal: Left chest wall mass measures 15.2 x 6.2 by 17.2 cm today versus 11.4 x 8.3 cm by 14.7 on the prior exam (when remeasured). A mild compression deformity involving the superior endplate of H63 is not significantly changed. CT ABDOMEN PELVIS FINDINGS Hepatobiliary: Segment 2 hypoattenuating lesion measures 11 mm on image 46/ series 2 versus 16 mm on the prior. No new liver lesion. Normal gallbladder. Upper normal common duct size, 7 mm. No obstructive cause identified. Pancreas: Normal, without mass or ductal dilatation. Spleen: Normal in size, without focal abnormality. Adrenals/Urinary Tract: Normal adrenal glands. 2.1 cm right renal cyst. Normal left kidney. No hydronephrosis. Normal urinary bladder. Stomach/Bowel: Normal stomach, without wall thickening. Colonic stool burden suggests constipation. Normal small bowel. Vascular/Lymphatic: Normal caliber of the aorta and branch vessels. Necrotic porta hepatis node of 1.9 x 2.5 cm on image 60/series 2. Similar to minimally enlarged from 1.8 x 2.2 on the prior exam. No pelvic sidewall adenopathy. Reproductive: Suspect a small uterine fundal fibroid on image 102/series 2. No adnexal mass. Other: Mild pelvic floor laxity. No significant free fluid. Anasarca. Musculoskeletal: Probable degenerative sclerosis about the left sacroiliac joint, similar. IMPRESSION: 1. Mixed response, with overall disease progression. 2. Enlargement of left chest wall mass with improvement in left axillary but slight increase in left subpectoral adenopathy. Similar left supraclavicular adenopathy. 3. Progression of pulmonary metastasis. 4. Improvement in isolated hepatic metastasis with enlargement of necrotic periportal adenopathy. 5. Small bilateral pleural effusions. 6.  Possible  constipation. Electronically Signed   By: Abigail Miyamoto M.D.   On: 04/21/2017 10:04    Orson Eva, DO  [facility] Pager [phone]  If 7PM-7AM, please contact night-coverage [email or web address] Password [email or web address] 04/30/2017, 7:37 AM   LOS: 1 day

## 2017-05-01 DIAGNOSIS — C50919 Malignant neoplasm of unspecified site of unspecified female breast: Secondary | ICD-10-CM

## 2017-05-01 DIAGNOSIS — N63 Unspecified lump in unspecified breast: Secondary | ICD-10-CM

## 2017-05-01 DIAGNOSIS — C50412 Malignant neoplasm of upper-outer quadrant of left female breast: Principal | ICD-10-CM

## 2017-05-01 DIAGNOSIS — D62 Acute posthemorrhagic anemia: Secondary | ICD-10-CM

## 2017-05-01 DIAGNOSIS — D500 Iron deficiency anemia secondary to blood loss (chronic): Secondary | ICD-10-CM

## 2017-05-01 DIAGNOSIS — Z170 Estrogen receptor positive status [ER+]: Secondary | ICD-10-CM

## 2017-05-01 DIAGNOSIS — R58 Hemorrhage, not elsewhere classified: Secondary | ICD-10-CM

## 2017-05-01 LAB — CBC
HCT: 25.4 % — ABNORMAL LOW (ref 36.0–46.0)
HEMOGLOBIN: 8.4 g/dL — AB (ref 12.0–15.0)
MCH: 30.5 pg (ref 26.0–34.0)
MCHC: 33.1 g/dL (ref 30.0–36.0)
MCV: 92.4 fL (ref 78.0–100.0)
PLATELETS: 451 10*3/uL — AB (ref 150–400)
RBC: 2.75 MIL/uL — AB (ref 3.87–5.11)
RDW: 15.4 % (ref 11.5–15.5)
WBC: 16.5 10*3/uL — ABNORMAL HIGH (ref 4.0–10.5)

## 2017-05-01 LAB — BASIC METABOLIC PANEL
Anion gap: 5 (ref 5–15)
BUN: 18 mg/dL (ref 6–20)
CHLORIDE: 106 mmol/L (ref 101–111)
CO2: 26 mmol/L (ref 22–32)
CREATININE: 0.87 mg/dL (ref 0.44–1.00)
Calcium: 7.9 mg/dL — ABNORMAL LOW (ref 8.9–10.3)
Glucose, Bld: 89 mg/dL (ref 65–99)
POTASSIUM: 4.2 mmol/L (ref 3.5–5.1)
SODIUM: 137 mmol/L (ref 135–145)

## 2017-05-01 LAB — TYPE AND SCREEN
ABO/RH(D): A POS
Antibody Screen: NEGATIVE
Unit division: 0
Unit division: 0

## 2017-05-01 LAB — MAGNESIUM: MAGNESIUM: 1.9 mg/dL (ref 1.7–2.4)

## 2017-05-01 LAB — BPAM RBC
BLOOD PRODUCT EXPIRATION DATE: 201812242359
BLOOD PRODUCT EXPIRATION DATE: 201812272359
ISSUE DATE / TIME: 201812071541
ISSUE DATE / TIME: 201812061945
UNIT TYPE AND RH: 6200
Unit Type and Rh: 6200

## 2017-05-01 NOTE — Progress Notes (Signed)
PROGRESS NOTE  Ann Oliver ZOX:[id number] DOB: [birth year] DOA: 04/29/2017 PCP: [facility]  Brief History:  61 year old female with a history of metastatic breast cancer, malnutrition, and blood loss anemia presenting from [facility] due to bleeding from her left fungating breast mass.  The patient has had numerous hospital admissions in the last 6 months secondary to bleeding from her left-sided breast mass.  The patient was sent to the emergency department because of concerns for bleeding from her wound site.  most recently, the patient was admitted to [facility] secondary to bleeding from her breast mass.  The patient was seen by general surgery, the wound care team, palliative medicine, psychiatry, and her medical oncologist [name]).  The patient was deemed to have capacity to make medical decisions.  She wished to continue the full scope of care.  She required complex wound care by the wound care team and the general surgical team.  Most recently, the patient was transfused 2 units PRBC on April 27, 2017.  After, the patient was ultimately discharged to stabilization [facility].  The patient is a poor historian and very tangential, but it is been noted that the patient is essentially homeless.  Recent CT of the abdomen and pelvis on April 20, 2017 largely showed progression of her metastatic disease with enlargement of her chest wall mass and increase in left subpectoral lymph nodes.  There was also progression of her pulmonary metastasis.  She was noted to have hemoglobin 6.7, but she remained hemodynamically stable.  Assessment/Plan: Metastatic breast cancer--invasive adenocarcinoma  -diagnosed in May 2017 -Medical oncologist is Dr. Nicholas Lose -Last chemotherapy March 15, 2017 -April 20, 2017- CTabd/pelvis shows progression of metastatic disease -Previously seen by palliative medicine last admission--> refused hospice; continue full scope of care -Overall  poor prognosis--[name] recommends hospice with comfort measures  Acute blood loss anemia/Bleeding breast mass -Secondary to fungating left breast mass -Baseline hemoglobin 7-8 -transfused 2 units PRBC 12/4 and one unit 12/6  -transfuse an additional unit PRBC (2 units this admission) -As documented by multiple specialists on the patient's medical care team--patient will continue to have high risk of rebleeding -Monitor serial CBC -general surgery consult appreciated--case discussed with Dr. Dimple Casey need for repeated dressing changes as this will create more bleeding -pt has been set up at [facility] wound care center on 05/07/17 at [facility] -no [facility] or SNF willing to take on patient's care  Moderate Malnutrition -Nutrition consult  Cancer related pain -Continue OxyContin and Percocet  leukocytosis -The patient is afebrile hemodynamically stable -Check lactic acid--3.5-->likely related to hypovolemic stated from blood loss anemia rather than sepsis -Remain off antibiotics and monitor clinically -Likely stress demargination -UA  GOC  -pt is DNR as noted on last palliative medicine consultation -pt adamant about refusing hospice -pt wants to go home--refuses SNF   Disposition Plan:  SNF when stable  Family Communication:  No Family at bedside--Total time spent 40 minutes.  Greater than 50% spent face to face counseling and coordinating care.   Consultants:  General surgery  Code Status:  FULL   DVT Prophylaxis:  SCDs   Procedures: As Listed in Progress Note Above  Antibiotics: None      Subjective: Patient says the pain is better controlled today.  She denies any nausea, vomiting, diarrhea, chest pain, short of breath, fevers, chills, headache.  Objective: Vitals:   04/30/17 2200 05/01/17 0603 05/01/17 1330 05/01/17 1525  BP: 120/71 128/64 122/65  Pulse: (!) 101 (!) 102 97   Resp: 18 20 19    Temp: 98.4 F (36.9 C) 98.2 F (36.8 C) 98.4 F  (36.9 C)   TempSrc: Oral Oral Oral   SpO2: 100% 100% 99% 98%  Weight:      Height:        Intake/Output Summary (Last 24 hours) at 05/01/2017 1712 Last data filed at 05/01/2017 0000 Gross per 24 hour  Intake 360 ml  Output -  Net 360 ml   Weight change:  Exam:   General:  Pt is alert, follows commands appropriately, not in acute distress  HEENT: No icterus, No thrush, No neck mass, [place]/AT  Cardiovascular: RRR, S1/S2, no rubs, no gallops  Respiratory: CTA bilaterally, no wheezing, no crackles, no rhonchi  Abdomen: Soft/+BS, non tender, non distended, no guarding  Extremities: 1 + LE edema, No lymphangitis, No petechiae, No rashes, no synovitis   Data Reviewed: I have personally reviewed following labs and imaging studies Basic Metabolic Panel: Recent Labs  Lab 04/27/17 0554 04/29/17 1542 04/30/17 0634 05/01/17 0530  NA 136 136 134* 137  K 4.3 5.0 4.5 4.2  CL 102 102 101 106  CO2 29 26 27 26   GLUCOSE 95 136* 98 89  BUN 16 19 18 18   CREATININE 0.88 0.92 0.89 0.87  CALCIUM 8.2* 8.1* 8.0* 7.9*  MG  --   --   --  1.9   Liver Function Tests: No results for input(s): AST, ALT, ALKPHOS, BILITOT, PROT, ALBUMIN in the last 168 hours. No results for input(s): LIPASE, AMYLASE in the last 168 hours. No results for input(s): AMMONIA in the last 168 hours. Coagulation Profile: No results for input(s): INR, PROTIME in the last 168 hours. CBC: Recent Labs  Lab 04/27/17 0554 04/28/17 0500 04/29/17 1542 04/30/17 0634 05/01/17 0530  WBC 12.4* 16.5* 16.5* 16.6* 16.5*  NEUTROABS  --   --  13.6*  --   --   HGB 5.1* 8.7* 6.7* 7.1* 8.4*  HCT 16.1* 26.3* 21.4* 21.7* 25.4*  MCV 85.6 86.5 89.5 89.7 92.4  PLT 547* 590* 603* 455* 451*   Cardiac Enzymes: No results for input(s): CKTOTAL, CKMB, CKMBINDEX, TROPONINI in the last 168 hours. BNP: Invalid input(s): POCBNP CBG: No results for input(s): GLUCAP in the last 168 hours. HbA1C: No results for input(s): HGBA1C in the  last 72 hours. Urine analysis:    Component Value Date/Time   COLORURINE YELLOW 03/27/2017 1459   APPEARANCEUR CLEAR 03/27/2017 1459   LABSPEC 1.012 03/27/2017 1459   PHURINE 5.0 03/27/2017 1459   GLUCOSEU NEGATIVE 03/27/2017 1459   HGBUR NEGATIVE 03/27/2017 1459   BILIRUBINUR NEGATIVE 03/27/2017 1459   KETONESUR 5 (A) 03/27/2017 1459   PROTEINUR NEGATIVE 03/27/2017 1459   NITRITE NEGATIVE 03/27/2017 1459   LEUKOCYTESUR TRACE (A) 03/27/2017 1459   Sepsis Labs: @LABRCNTIP (procalcitonin:4,lacticidven:4) ) Recent Results (from the past 240 hour(s))  MRSA PCR Screening     Status: None   Collection Time: 04/30/17 12:28 AM  Result Value Ref Range Status   MRSA by PCR NEGATIVE NEGATIVE Final    Comment:        The GeneXpert MRSA Assay (FDA approved for NASAL specimens only), is one component of a comprehensive MRSA colonization surveillance program. It is not intended to diagnose MRSA infection nor to guide or monitor treatment for MRSA infections.      Scheduled Meds: . docusate sodium  100 mg Oral BID  . feeding supplement (ENSURE ENLIVE)  237 mL Oral  BID BM  . gabapentin  600 mg Oral TID  . mirtazapine  15 mg Oral QHS  . oxyCODONE  10 mg Oral Q12H  . polyethylene glycol  17 g Oral Daily   Continuous Infusions:  Procedures/Studies: Ct Chest W Contrast  Result Date: 04/21/2017 CLINICAL DATA:  Breast cancer. Restaging. Non osseous recurrent suspected. EXAM: CT CHEST, ABDOMEN, AND PELVIS WITH CONTRAST TECHNIQUE: Multidetector CT imaging of the chest, abdomen and pelvis was performed following the standard protocol during bolus administration of intravenous contrast. CONTRAST:  132mL ISOVUE-300 IOPAMIDOL (ISOVUE-300) INJECTION 61%, 49mL ISOVUE-300 IOPAMIDOL (ISOVUE-300) INJECTION 61% COMPARISON:  Plain film 03/18/2017.  10/28/2016 CT. FINDINGS: CT CHEST FINDINGS Cardiovascular: Aortic atherosclerosis. Mild cardiomegaly, without pericardial effusion. No central pulmonary  embolism, on this non-dedicated study. Right-sided PICC line which terminates at the high right atrium. Mediastinum/Nodes: Left supraclavicular node of 9 mm on image 8/series 2 is similar. Well-circumscribed hypoattenuating right thyroid nodule is grossly similar and of doubtful clinical significance. left axillary nodes are decreased, with an index measuring 1.0 cm today versus 2.1 cm on the prior exam (when remeasured). A left subpectoral node measures 11 mm today versus 9 mm on the prior exam (when remeasured). No mediastinal or hilar adenopathy. Lungs/Pleura: Trace bilateral pleural fluid, new. Bilateral pulmonary nodules consistent with metastasis. Index left upper lobe pulmonary nodule measures 1.4 cm on image 46/ series 4 versus 1.1 cm on the prior. An index right lower lobe pulmonary nodule measures 9 mm on image 88/series 4 versus 8 mm on the prior exam (when remeasured). Inferior lingular nodule measures 1.8 cm on image 84/ series 4 and is similar 1.9 cm on the prior. Subpleural left upper lobe 1.3 cm nodule on image 67/series 4 is new. Musculoskeletal: Left chest wall mass measures 15.2 x 6.2 by 17.2 cm today versus 11.4 x 8.3 cm by 14.7 on the prior exam (when remeasured). A mild compression deformity involving the superior endplate of X52 is not significantly changed. CT ABDOMEN PELVIS FINDINGS Hepatobiliary: Segment 2 hypoattenuating lesion measures 11 mm on image 46/ series 2 versus 16 mm on the prior. No new liver lesion. Normal gallbladder. Upper normal common duct size, 7 mm. No obstructive cause identified. Pancreas: Normal, without mass or ductal dilatation. Spleen: Normal in size, without focal abnormality. Adrenals/Urinary Tract: Normal adrenal glands. 2.1 cm right renal cyst. Normal left kidney. No hydronephrosis. Normal urinary bladder. Stomach/Bowel: Normal stomach, without wall thickening. Colonic stool burden suggests constipation. Normal small bowel. Vascular/Lymphatic: Normal caliber of  the aorta and branch vessels. Necrotic porta hepatis node of 1.9 x 2.5 cm on image 60/series 2. Similar to minimally enlarged from 1.8 x 2.2 on the prior exam. No pelvic sidewall adenopathy. Reproductive: Suspect a small uterine fundal fibroid on image 102/series 2. No adnexal mass. Other: Mild pelvic floor laxity. No significant free fluid. Anasarca. Musculoskeletal: Probable degenerative sclerosis about the left sacroiliac joint, similar. IMPRESSION: 1. Mixed response, with overall disease progression. 2. Enlargement of left chest wall mass with improvement in left axillary but slight increase in left subpectoral adenopathy. Similar left supraclavicular adenopathy. 3. Progression of pulmonary metastasis. 4. Improvement in isolated hepatic metastasis with enlargement of necrotic periportal adenopathy. 5. Small bilateral pleural effusions. 6.  Possible constipation. Electronically Signed   By: Abigail Miyamoto M.D.   On: 04/21/2017 10:04   Ct Abdomen Pelvis W Contrast  Result Date: 04/21/2017 CLINICAL DATA:  Breast cancer. Restaging. Non osseous recurrent suspected. EXAM: CT CHEST, ABDOMEN, AND PELVIS WITH CONTRAST TECHNIQUE: Multidetector CT  imaging of the chest, abdomen and pelvis was performed following the standard protocol during bolus administration of intravenous contrast. CONTRAST:  176mL ISOVUE-300 IOPAMIDOL (ISOVUE-300) INJECTION 61%, 73mL ISOVUE-300 IOPAMIDOL (ISOVUE-300) INJECTION 61% COMPARISON:  Plain film 03/18/2017.  10/28/2016 CT. FINDINGS: CT CHEST FINDINGS Cardiovascular: Aortic atherosclerosis. Mild cardiomegaly, without pericardial effusion. No central pulmonary embolism, on this non-dedicated study. Right-sided PICC line which terminates at the high right atrium. Mediastinum/Nodes: Left supraclavicular node of 9 mm on image 8/series 2 is similar. Well-circumscribed hypoattenuating right thyroid nodule is grossly similar and of doubtful clinical significance. left axillary nodes are decreased,  with an index measuring 1.0 cm today versus 2.1 cm on the prior exam (when remeasured). A left subpectoral node measures 11 mm today versus 9 mm on the prior exam (when remeasured). No mediastinal or hilar adenopathy. Lungs/Pleura: Trace bilateral pleural fluid, new. Bilateral pulmonary nodules consistent with metastasis. Index left upper lobe pulmonary nodule measures 1.4 cm on image 46/ series 4 versus 1.1 cm on the prior. An index right lower lobe pulmonary nodule measures 9 mm on image 88/series 4 versus 8 mm on the prior exam (when remeasured). Inferior lingular nodule measures 1.8 cm on image 84/ series 4 and is similar 1.9 cm on the prior. Subpleural left upper lobe 1.3 cm nodule on image 67/series 4 is new. Musculoskeletal: Left chest wall mass measures 15.2 x 6.2 by 17.2 cm today versus 11.4 x 8.3 cm by 14.7 on the prior exam (when remeasured). A mild compression deformity involving the superior endplate of W10 is not significantly changed. CT ABDOMEN PELVIS FINDINGS Hepatobiliary: Segment 2 hypoattenuating lesion measures 11 mm on image 46/ series 2 versus 16 mm on the prior. No new liver lesion. Normal gallbladder. Upper normal common duct size, 7 mm. No obstructive cause identified. Pancreas: Normal, without mass or ductal dilatation. Spleen: Normal in size, without focal abnormality. Adrenals/Urinary Tract: Normal adrenal glands. 2.1 cm right renal cyst. Normal left kidney. No hydronephrosis. Normal urinary bladder. Stomach/Bowel: Normal stomach, without wall thickening. Colonic stool burden suggests constipation. Normal small bowel. Vascular/Lymphatic: Normal caliber of the aorta and branch vessels. Necrotic porta hepatis node of 1.9 x 2.5 cm on image 60/series 2. Similar to minimally enlarged from 1.8 x 2.2 on the prior exam. No pelvic sidewall adenopathy. Reproductive: Suspect a small uterine fundal fibroid on image 102/series 2. No adnexal mass. Other: Mild pelvic floor laxity. No significant free  fluid. Anasarca. Musculoskeletal: Probable degenerative sclerosis about the left sacroiliac joint, similar. IMPRESSION: 1. Mixed response, with overall disease progression. 2. Enlargement of left chest wall mass with improvement in left axillary but slight increase in left subpectoral adenopathy. Similar left supraclavicular adenopathy. 3. Progression of pulmonary metastasis. 4. Improvement in isolated hepatic metastasis with enlargement of necrotic periportal adenopathy. 5. Small bilateral pleural effusions. 6.  Possible constipation. Electronically Signed   By: Abigail Miyamoto M.D.   On: 04/21/2017 10:04    Orson Eva, DO  [facility] Pager [phone]  If 7PM-7AM, please contact night-coverage [email or web address] Password [email or web address] 05/01/2017, 5:12 PM   LOS: 2 days

## 2017-05-01 NOTE — Consult Note (Signed)
[facility] Consult  Reason for Consult: Fungating, Bleeding, Left Breast Cancer with Metastasis  Referring Physician:  Dr. Tat  Chief Complaint    Wound Check     Ann Oliver is a 61 y.o. female.  HPI: Ann Oliver is a 61 yo unfortunate lady who has a history of metastatic invasive adenocarcinoma grade 2-3, ER 20%, PR 0%, HER2 negative, Ki-67 30% of her left breast first diagnosed May 2017 as as 3.1cm mass.  She has extension into her left chest wall and has liver and pulmonary metastasis which was first diagnosed 04/2016.  She was previously offered a mastectomy but declined on multiple occasions.  She was scheduled for chemotherapy but lost to follow up initially who was previously but has received some palliative chemotherapy with Taxol every 3 weeks since 10/2016.  Notes also indicate received some radiation, but the extent is not clear. She has come into the ED multiple times with bleeding from this large fungating mass. There is some reports of the tumor shrinking somewhat during the chemotherapy treatment.    She had been discharged to [facility] with plans for a dressing change, but this has been bleeding, and she was brought to the Ed for further evaluation and care. The wound was redressed and has since stopped since her admission.    Past Medical History:  Diagnosis Date  . Breast cancer (HCC)   . Sinusitis     Past Surgical History:  Procedure Laterality Date  . BIOPSY BREAST    . IR FLUORO GUIDE CV LINE RIGHT  11/06/2016  . IR US GUIDE VASC ACCESS RIGHT  11/06/2016    Family History  Family history unknown: Yes    Social History   Tobacco Use  . Smoking status: Never Smoker  . Smokeless tobacco: Never Used  Substance Use Topics  . Alcohol use: No  . Drug use: No    Medications:  I have reviewed the patient's current medications. Prior to Admission:  Medications Prior to Admission  Medication Sig Dispense Refill Last Dose  . ANUCORT-HC 25 MG  suppository unwrap and insert 1 suppository rectally twice a day if needed for HEMORRHOIDS OR ANAL ITCHING 12 suppository 0 unknown  . gabapentin (NEURONTIN) 600 MG tablet Take 1 tablet (600 mg total) 3 (three) times daily by mouth. 90 tablet 1 04/29/2017 at Unknown time  . mirtazapine (REMERON) 15 MG tablet Take 15 mg by mouth at bedtime.   04/28/2017 at Unknown time  . ondansetron (ZOFRAN) 8 MG tablet take 1 tablet by mouth every 8 hours if needed for nausea and vomiting 20 tablet 0 unknown  . oxyCODONE (OXYCONTIN) 10 mg 12 hr tablet Take 1 tablet (10 mg total) by mouth every 12 (twelve) hours. 10 tablet 0 04/29/2017 at Unknown time  . oxyCODONE-acetaminophen (PERCOCET/ROXICET) 5-325 MG tablet Take 2 tablets by mouth every 6 (six) hours as needed for severe pain. 12 tablet 0 04/29/2017 at Unknown time  . polyethylene glycol (MIRALAX / GLYCOLAX) packet Take 17 g by mouth daily. 14 each 0 04/29/2017 at Unknown time   Scheduled: . docusate sodium  100 mg Oral BID  . feeding supplement (ENSURE ENLIVE)  237 mL Oral BID BM  . gabapentin  600 mg Oral TID  . mirtazapine  15 mg Oral QHS  . oxyCODONE  10 mg Oral Q12H  . polyethylene glycol  17 g Oral Daily   Continuous:  PRN:acetaminophen **OR** acetaminophen, ondansetron **OR** ondansetron (ZOFRAN) IV, oxyCODONE-acetaminophen  ROS:  A comprehensive   review of systems was negative except for: Constitutional: positive for fatigue and weight loss Integument/breast: positive for large fungating, bleeding left breast mass, pain and burning Behavioral/Psych: positive for reported to have capacity   Blood pressure 122/65, pulse 97, temperature 98.4 F (36.9 C), temperature source Oral, resp. rate 19, height 5' 2" (1.575 m), weight 119 lb (54 kg), SpO2 98 %. Physical Exam  Constitutional: She is oriented to person, place, and time. She appears malnourished. She appears unhealthy. She appears cachectic. She appears distressed.  HENT:  Head: Normocephalic.    Temporal wasting  Eyes: Pupils are equal, round, and reactive to light.  Neck: Normal range of motion.  Cardiovascular: Normal rate.  Pulmonary/Chest: Effort normal.  Large fungating bleeding breast cancer with induration of the upper outer quadrant of the left breast, fungating mass with at least 5cm of overhang from the exit point on the skin on the inferior and lateral sides, bleeding after dressing removed  Abdominal: Soft. She exhibits no distension. There is no tenderness.  Musculoskeletal:  Decreased ability to move left arm due to mass  Neurological: She is alert and oriented to person, place, and time.  Skin: Skin is warm and dry.  Psychiatric: Affect normal.  Somewhat poor historian on specifics        Results: Results for orders placed or performed during the hospital encounter of 04/29/17 (from the past 48 hour(s))  Prepare RBC     Status: None   Collection Time: 04/29/17  4:32 PM  Result Value Ref Range   Order Confirmation ORDER PROCESSED BY BLOOD BANK   MRSA PCR Screening     Status: None   Collection Time: 04/30/17 12:28 AM  Result Value Ref Range   MRSA by PCR NEGATIVE NEGATIVE    Comment:        The GeneXpert MRSA Assay (FDA approved for NASAL specimens only), is one component of a comprehensive MRSA colonization surveillance program. It is not intended to diagnose MRSA infection nor to guide or monitor treatment for MRSA infections.   Basic metabolic panel     Status: Abnormal   Collection Time: 04/30/17  6:34 AM  Result Value Ref Range   Sodium 134 (L) 135 - 145 mmol/L   Potassium 4.5 3.5 - 5.1 mmol/L   Chloride 101 101 - 111 mmol/L   CO2 27 22 - 32 mmol/L   Glucose, Bld 98 65 - 99 mg/dL   BUN 18 6 - 20 mg/dL   Creatinine, Ser 0.89 0.44 - 1.00 mg/dL   Calcium 8.0 (L) 8.9 - 10.3 mg/dL   GFR calc non Af Amer >60 >60 mL/min   GFR calc Af Amer >60 >60 mL/min    Comment: (NOTE) The eGFR has been calculated using the CKD EPI equation. This  calculation has not been validated in all clinical situations. eGFR's persistently <60 mL/min signify possible Chronic Kidney Disease.    Anion gap 6 5 - 15  CBC     Status: Abnormal   Collection Time: 04/30/17  6:34 AM  Result Value Ref Range   WBC 16.6 (H) 4.0 - 10.5 K/uL   RBC 2.42 (L) 3.87 - 5.11 MIL/uL   Hemoglobin 7.1 (L) 12.0 - 15.0 g/dL   HCT 21.7 (L) 36.0 - 46.0 %   MCV 89.7 78.0 - 100.0 fL   MCH 29.3 26.0 - 34.0 pg   MCHC 32.7 30.0 - 36.0 g/dL   RDW 15.2 11.5 - 15.5 %   Platelets 455 (H) 150 -  400 K/uL  Prepare RBC     Status: None   Collection Time: 04/30/17  8:00 AM  Result Value Ref Range   Order Confirmation ORDER PROCESSED BY BLOOD BANK   Lactic acid, plasma     Status: Abnormal   Collection Time: 04/30/17  8:40 AM  Result Value Ref Range   Lactic Acid, Venous 3.5 (HH) 0.5 - 1.9 mmol/L    Comment: CRITICAL RESULT CALLED TO, READ BACK BY AND VERIFIED WITH: BULLINS,L AT 9:40AM ON 04/30/17 BY FESTERMAN,C   CBC     Status: Abnormal   Collection Time: 05/01/17  5:30 AM  Result Value Ref Range   WBC 16.5 (H) 4.0 - 10.5 K/uL   RBC 2.75 (L) 3.87 - 5.11 MIL/uL   Hemoglobin 8.4 (L) 12.0 - 15.0 g/dL   HCT 25.4 (L) 36.0 - 46.0 %   MCV 92.4 78.0 - 100.0 fL   MCH 30.5 26.0 - 34.0 pg   MCHC 33.1 30.0 - 36.0 g/dL   RDW 15.4 11.5 - 15.5 %   Platelets 451 (H) 150 - 400 K/uL  Basic metabolic panel     Status: Abnormal   Collection Time: 05/01/17  5:30 AM  Result Value Ref Range   Sodium 137 135 - 145 mmol/L   Potassium 4.2 3.5 - 5.1 mmol/L   Chloride 106 101 - 111 mmol/L   CO2 26 22 - 32 mmol/L   Glucose, Bld 89 65 - 99 mg/dL   BUN 18 6 - 20 mg/dL   Creatinine, Ser 0.87 0.44 - 1.00 mg/dL   Calcium 7.9 (L) 8.9 - 10.3 mg/dL   GFR calc non Af Amer >60 >60 mL/min   GFR calc Af Amer >60 >60 mL/min    Comment: (NOTE) The eGFR has been calculated using the CKD EPI equation. This calculation has not been validated in all clinical situations. eGFR's persistently <60 mL/min  signify possible Chronic Kidney Disease.    Anion gap 5 5 - 15  Magnesium     Status: None   Collection Time: 05/01/17  5:30 AM  Result Value Ref Range   Magnesium 1.9 1.7 - 2.4 mg/dL    Reviewed chart and prior notes from Oncology and Surgery.   Assessment & Plan:  Ann Oliver is a 61 y.o. female with metastatic invasive breast cancer with a bleeding fungating left breast mass. The patient was offered therapies during her initial diagnosis and was lost to follow up and refused mastectomy. She has evidently refused this multiple times based on the surgery notes. The patient now is at the point where the disease has extended beyond surgical options.  I have reviewed the case with Surgical Oncology and Plastic colleagues, and they recommended Radiation therapy for bleeding.  I do not see any specific notes from radiation and she did not mention radiation, but there is some indication in the chart from 10/2016 that she was getting some radiation at one point.  I am not sure why this stopped or how effective it was.    I do not think there is any surgical intervention that is viable at this point due to the necrotic wound and extension and lack of tissue to cover the large defect.    The patient reports wanting to go home and be with her family but this is not an option due to the wound and her bleeding, and placement has been hard because of the wound.  Agree palliation at this point is the best option,  and given the wound, I do not think she needs to go home with any form of repeated dressing changes as this promotes the bleeding.    -Leave dressing in place, redressed today after removing with quick clot and kerlix and ace bandages  -Will continue to think about issue and discuss case with others as the only goal at this point is to get control of the bleeding or a wound that is more manageable but this might not be possible  -Last discharge patient was a DNR per the discharge summary, will  make sure Dr. Carles Collet aware   All questions were answered to the satisfaction of the patient.   Virl Cagey 05/01/2017, 4:12 PM

## 2017-05-01 NOTE — Progress Notes (Signed)
Staff unable to get urine sample so far. Previous shift unable to collect urine d/t patient taking urine hat out of toilet. Patient holds urine, so does not urinate often. Patient urinated x1 so far for this shift. Most of urine flowed in toilet. Patient encouraged to move forward to catch urine. Some urine did collect in urine hat, but patient placed toilet paper in urine hat. Will continue to attempt to collect urine.

## 2017-05-02 LAB — CBC
HEMATOCRIT: 26.9 % — AB (ref 36.0–46.0)
Hemoglobin: 8.5 g/dL — ABNORMAL LOW (ref 12.0–15.0)
MCH: 30 pg (ref 26.0–34.0)
MCHC: 31.6 g/dL (ref 30.0–36.0)
MCV: 95.1 fL (ref 78.0–100.0)
Platelets: 496 10*3/uL — ABNORMAL HIGH (ref 150–400)
RBC: 2.83 MIL/uL — AB (ref 3.87–5.11)
RDW: 16.2 % — ABNORMAL HIGH (ref 11.5–15.5)
WBC: 16.3 10*3/uL — AB (ref 4.0–10.5)

## 2017-05-02 NOTE — Progress Notes (Signed)
PROGRESS NOTE  Ann Oliver JME:[id number] DOB: 05/20/1956 DOA: 04/29/2017 PCP: [facility]  Brief History: 61 year old female with a history of metastatic breast cancer, malnutrition, and blood loss anemia [facility]due to bleeding from her left fungating breast mass. The patient has had numerous hospital admissions in the last 6 months secondary to bleeding from her left-sided breast mass. The patient was sent to the emergency department because of concerns for bleeding from her wound site. Most recently, the patient was admitted to [facility] to bleeding from her breast mass. The patient was seen by general surgery, the wound care team, palliative medicine, psychiatry, and her medical oncologist Ann Oliver).The patient was deemed to have capacity to make medical decisions. She wished to continue the full scope of care. She required complex wound care by the wound care team and the general surgical team. Most recently, the patient was transfused 2 units PRBC on April 27, 2017. After stabilization, the patient was ultimately discharged to stabilization [facility].The patient is a poor historian and very tangential, but it is been noted that the patient is essentially homeless. Recent CT of the abdomen and pelvis on April 20, 2017 largely showed progression of her metastatic disease with enlargement of her chest wall mass and increase in left subpectoral lymph nodes. There was also progression of her pulmonary metastasis. She was noted to have hemoglobin 6.7,but she remained hemodynamically stable.  The patient was transfused a total of 2 units PRBC during this hospital admission.   The patient was seen by general surgery whom changed the patient's dressing.  General surgery did not feel patient was a candidate for operative intervention at this time.  I spoke with radiation oncology who agreed to see the patient in consultation for  palliative radiation to control bleeding.  Assessment/Plan: Metastatic breast cancer--invasive adenocarcinoma -diagnosed in May 2017 -Medical oncologist is Dr.Vinay Ann Oliver -Last chemotherapy March 15, 2017 -April 20, 2017- CTabd/pelvisshows progression of metastatic disease -Previously seen by palliative medicine last admission-->refused hospice;continue full scope of care -Overall poor prognosis--Dr. Gudenarecommends hospice with comfort measures,but patient refuses -please see pictures from Dr. [name] note on 05/01/17 -pt previously offered mastectomy, but she refused--now no longer a candidate  Acute blood loss anemia/Bleeding breast mass -Secondary to fungating left breast mass -Baseline hemoglobin7-8 -transfused 2 units PRBC 12/4 and one unit 12/6  -transfused 2 units this admission -As documented by multiple specialists on the patient's medical care team--patient will continue to have high risk of rebleeding -she has been dismissed by numerous home health companies due to her poor compliance and not answering her phone for appointments -Monitor serial CBC -general surgery consult appreciated--case discussed with Dr. Dimple Casey need for repeated dressing changes as this will create more bleeding -currently set up to have wound care changes at [facility] wound care center once per week -pt has been set up at [facility] wound care center on 05/07/17 at [facility] -no [place] or SNF willing to take on patient's care -she continues to be high risk for life threatening bleeding and recurrent hospitalization/readmission -05/02/17--case discussed with radiation oncology, Dr. [name] agreed to see patient in consult and offer XRT as this may help control bleeding  ModerateMalnutrition -Nutrition consult -continue supplements  Cancer related pain -Continue OxyContin and Percocet  leukocytosis -The patient is afebrile hemodynamically stable -Check lactic acid--3.5-->likely related  to hypovolemic stated from blood loss anemia rather than sepsis -Remain off antibiotics and monitor clinically-->has remained stable clinically -Likely  stress demargination  GOC  -pt is DNR as noted on last palliative medicine consultation -pt adamant about refusing hospice -pt wants to go home--refuses SNF   Disposition Plan:Transfer to [place] Communication:NoFamily at bedside--Total time spent75minutes. Greater than 50% spent face to face counseling and coordinating care.   Consultants:General surgery, radiation oncology  Code Status: FULL   DVT Prophylaxis: SCDs   Procedures: As Listed in Progress Note Above  Antibiotics: None      Subjective: Patient denies fevers, chills, headache, chest pain, dyspnea, nausea, vomiting, diarrhea, abdominal pain, dysuria, hematuria, hematochezia, and melena.   Objective: Vitals:   05/01/17 1330 05/01/17 1525 05/01/17 2056 05/02/17 0520  BP: 122/65  113/63 112/63  Pulse: 97  100 85  Resp: 19  20 20   Temp: 98.4 F (36.9 C)  98.3 F (36.8 C) 98.2 F (36.8 C)  TempSrc: Oral  Oral Oral  SpO2: 99% 98% 100% 100%  Weight:      Height:        Intake/Output Summary (Last 24 hours) at 05/02/2017 1029 Last data filed at 05/02/2017 0500 Gross per 24 hour  Intake 460 ml  Output -  Net 460 ml   Weight change:  Exam:   General:  Pt is alert, follows commands appropriately, not in acute distress  HEENT: No icterus, No thrush, No neck mass, [place]/AT  Cardiovascular: RRR, S1/S2, no rubs, no gallops  Respiratory: CTA bilaterally, no wheezing, no crackles, no rhonchi  Abdomen: Soft/+BS, non tender, non distended, no guarding  Extremities: No edema, No lymphangitis, No petechiae, No rashes, no synovitis; bulky dressing wrapped around left breast   Data Reviewed: I have personally reviewed following labs and imaging studies Basic Metabolic Panel: Recent Labs  Lab 04/27/17 0554  04/29/17 1542 04/30/17 0634 05/01/17 0530  NA 136 136 134* 137  K 4.3 5.0 4.5 4.2  CL 102 102 101 106  CO2 29 26 27 26   GLUCOSE 95 136* 98 89  BUN 16 19 18 18   CREATININE 0.88 0.92 0.89 0.87  CALCIUM 8.2* 8.1* 8.0* 7.9*  MG  --   --   --  1.9   Liver Function Tests: No results for input(s): AST, ALT, ALKPHOS, BILITOT, PROT, ALBUMIN in the last 168 hours. No results for input(s): LIPASE, AMYLASE in the last 168 hours. No results for input(s): AMMONIA in the last 168 hours. Coagulation Profile: No results for input(s): INR, PROTIME in the last 168 hours. CBC: Recent Labs  Lab 04/28/17 0500 04/29/17 1542 04/30/17 0634 05/01/17 0530 05/02/17 0518  WBC 16.5* 16.5* 16.6* 16.5* 16.3*  NEUTROABS  --  13.6*  --   --   --   HGB 8.7* 6.7* 7.1* 8.4* 8.5*  HCT 26.3* 21.4* 21.7* 25.4* 26.9*  MCV 86.5 89.5 89.7 92.4 95.1  PLT 590* 603* 455* 451* 496*   Cardiac Enzymes: No results for input(s): CKTOTAL, CKMB, CKMBINDEX, TROPONINI in the last 168 hours. BNP: Invalid input(s): POCBNP CBG: No results for input(s): GLUCAP in the last 168 hours. HbA1C: No results for input(s): HGBA1C in the last 72 hours. Urine analysis:    Component Value Date/Time   COLORURINE YELLOW 03/27/2017 [place] 03/27/2017 1459   LABSPEC 1.012 03/27/2017 1459   PHURINE 5.0 03/27/2017 1459   GLUCOSEU NEGATIVE 03/27/2017 1459   HGBUR NEGATIVE 03/27/2017 1459   BILIRUBINUR NEGATIVE 03/27/2017 1459   KETONESUR 5 (A) 03/27/2017 1459   PROTEINUR NEGATIVE 03/27/2017 1459   NITRITE NEGATIVE 03/27/2017 1459  LEUKOCYTESUR TRACE (A) 03/27/2017 1459   Sepsis Labs: @LABRCNTIP (procalcitonin:4,lacticidven:4) ) Recent Results (from the past 240 hour(s))  MRSA PCR Screening     Status: None   Collection Time: 04/30/17 12:28 AM  Result Value Ref Range Status   MRSA by PCR NEGATIVE NEGATIVE Final    Comment:        The GeneXpert MRSA Assay (FDA approved for NASAL specimens only), is one  component of a comprehensive MRSA colonization surveillance program. It is not intended to diagnose MRSA infection nor to guide or monitor treatment for MRSA infections.      Scheduled Meds: . docusate sodium  100 mg Oral BID  . feeding supplement (ENSURE ENLIVE)  237 mL Oral BID BM  . gabapentin  600 mg Oral TID  . mirtazapine  15 mg Oral QHS  . oxyCODONE  10 mg Oral Q12H  . polyethylene glycol  17 g Oral Daily   Continuous Infusions:  Procedures/Studies: Ct Chest W Contrast  Result Date: 04/21/2017 CLINICAL DATA:  Breast cancer. Restaging. Non osseous recurrent suspected. EXAM: CT CHEST, ABDOMEN, AND PELVIS WITH CONTRAST TECHNIQUE: Multidetector CT imaging of the chest, abdomen and pelvis was performed following the standard protocol during bolus administration of intravenous contrast. CONTRAST:  173mL ISOVUE-300 IOPAMIDOL (ISOVUE-300) INJECTION 61%, 16mL ISOVUE-300 IOPAMIDOL (ISOVUE-300) INJECTION 61% COMPARISON:  Plain film 03/18/2017.  10/28/2016 CT. FINDINGS: CT CHEST FINDINGS Cardiovascular: Aortic atherosclerosis. Mild cardiomegaly, without pericardial effusion. No central pulmonary embolism, on this non-dedicated study. Right-sided PICC line which terminates at the high right atrium. Mediastinum/Nodes: Left supraclavicular node of 9 mm on image 8/series 2 is similar. Well-circumscribed hypoattenuating right thyroid nodule is grossly similar and of doubtful clinical significance. left axillary nodes are decreased, with an index measuring 1.0 cm today versus 2.1 cm on the prior exam (when remeasured). A left subpectoral node measures 11 mm today versus 9 mm on the prior exam (when remeasured). No mediastinal or hilar adenopathy. Lungs/Pleura: Trace bilateral pleural fluid, new. Bilateral pulmonary nodules consistent with metastasis. Index left upper lobe pulmonary nodule measures 1.4 cm on image 46/ series 4 versus 1.1 cm on the prior. An index right lower lobe pulmonary nodule  measures 9 mm on image 88/series 4 versus 8 mm on the prior exam (when remeasured). Inferior lingular nodule measures 1.8 cm on image 84/ series 4 and is similar 1.9 cm on the prior. Subpleural left upper lobe 1.3 cm nodule on image 67/series 4 is new. Musculoskeletal: Left chest wall mass measures 15.2 x 6.2 by 17.2 cm today versus 11.4 x 8.3 cm by 14.7 on the prior exam (when remeasured). A mild compression deformity involving the superior endplate of K53 is not significantly changed. CT ABDOMEN PELVIS FINDINGS Hepatobiliary: Segment 2 hypoattenuating lesion measures 11 mm on image 46/ series 2 versus 16 mm on the prior. No new liver lesion. Normal gallbladder. Upper normal common duct size, 7 mm. No obstructive cause identified. Pancreas: Normal, without mass or ductal dilatation. Spleen: Normal in size, without focal abnormality. Adrenals/Urinary Tract: Normal adrenal glands. 2.1 cm right renal cyst. Normal left kidney. No hydronephrosis. Normal urinary bladder. Stomach/Bowel: Normal stomach, without wall thickening. Colonic stool burden suggests constipation. Normal small bowel. Vascular/Lymphatic: Normal caliber of the aorta and branch vessels. Necrotic porta hepatis node of 1.9 x 2.5 cm on image 60/series 2. Similar to minimally enlarged from 1.8 x 2.2 on the prior exam. No pelvic sidewall adenopathy. Reproductive: Suspect a small uterine fundal fibroid on image 102/series 2. No adnexal mass. Other:  Mild pelvic floor laxity. No significant free fluid. Anasarca. Musculoskeletal: Probable degenerative sclerosis about the left sacroiliac joint, similar. IMPRESSION: 1. Mixed response, with overall disease progression. 2. Enlargement of left chest wall mass with improvement in left axillary but slight increase in left subpectoral adenopathy. Similar left supraclavicular adenopathy. 3. Progression of pulmonary metastasis. 4. Improvement in isolated hepatic metastasis with enlargement of necrotic periportal  adenopathy. 5. Small bilateral pleural effusions. 6.  Possible constipation. Electronically Signed   By: Abigail Miyamoto M.D.   On: 04/21/2017 10:04   Ct Abdomen Pelvis W Contrast  Result Date: 04/21/2017 CLINICAL DATA:  Breast cancer. Restaging. Non osseous recurrent suspected. EXAM: CT CHEST, ABDOMEN, AND PELVIS WITH CONTRAST TECHNIQUE: Multidetector CT imaging of the chest, abdomen and pelvis was performed following the standard protocol during bolus administration of intravenous contrast. CONTRAST:  159mL ISOVUE-300 IOPAMIDOL (ISOVUE-300) INJECTION 61%, 41mL ISOVUE-300 IOPAMIDOL (ISOVUE-300) INJECTION 61% COMPARISON:  Plain film 03/18/2017.  10/28/2016 CT. FINDINGS: CT CHEST FINDINGS Cardiovascular: Aortic atherosclerosis. Mild cardiomegaly, without pericardial effusion. No central pulmonary embolism, on this non-dedicated study. Right-sided PICC line which terminates at the high right atrium. Mediastinum/Nodes: Left supraclavicular node of 9 mm on image 8/series 2 is similar. Well-circumscribed hypoattenuating right thyroid nodule is grossly similar and of doubtful clinical significance. left axillary nodes are decreased, with an index measuring 1.0 cm today versus 2.1 cm on the prior exam (when remeasured). A left subpectoral node measures 11 mm today versus 9 mm on the prior exam (when remeasured). No mediastinal or hilar adenopathy. Lungs/Pleura: Trace bilateral pleural fluid, new. Bilateral pulmonary nodules consistent with metastasis. Index left upper lobe pulmonary nodule measures 1.4 cm on image 46/ series 4 versus 1.1 cm on the prior. An index right lower lobe pulmonary nodule measures 9 mm on image 88/series 4 versus 8 mm on the prior exam (when remeasured). Inferior lingular nodule measures 1.8 cm on image 84/ series 4 and is similar 1.9 cm on the prior. Subpleural left upper lobe 1.3 cm nodule on image 67/series 4 is new. Musculoskeletal: Left chest wall mass measures 15.2 x 6.2 by 17.2 cm today  versus 11.4 x 8.3 cm by 14.7 on the prior exam (when remeasured). A mild compression deformity involving the superior endplate of Z76 is not significantly changed. CT ABDOMEN PELVIS FINDINGS Hepatobiliary: Segment 2 hypoattenuating lesion measures 11 mm on image 46/ series 2 versus 16 mm on the prior. No new liver lesion. Normal gallbladder. Upper normal common duct size, 7 mm. No obstructive cause identified. Pancreas: Normal, without mass or ductal dilatation. Spleen: Normal in size, without focal abnormality. Adrenals/Urinary Tract: Normal adrenal glands. 2.1 cm right renal cyst. Normal left kidney. No hydronephrosis. Normal urinary bladder. Stomach/Bowel: Normal stomach, without wall thickening. Colonic stool burden suggests constipation. Normal small bowel. Vascular/Lymphatic: Normal caliber of the aorta and branch vessels. Necrotic porta hepatis node of 1.9 x 2.5 cm on image 60/series 2. Similar to minimally enlarged from 1.8 x 2.2 on the prior exam. No pelvic sidewall adenopathy. Reproductive: Suspect a small uterine fundal fibroid on image 102/series 2. No adnexal mass. Other: Mild pelvic floor laxity. No significant free fluid. Anasarca. Musculoskeletal: Probable degenerative sclerosis about the left sacroiliac joint, similar. IMPRESSION: 1. Mixed response, with overall disease progression. 2. Enlargement of left chest wall mass with improvement in left axillary but slight increase in left subpectoral adenopathy. Similar left supraclavicular adenopathy. 3. Progression of pulmonary metastasis. 4. Improvement in isolated hepatic metastasis with enlargement of necrotic periportal adenopathy. 5. Small bilateral  pleural effusions. 6.  Possible constipation. Electronically Signed   By: Abigail Miyamoto M.D.   On: 04/21/2017 10:04    Orson Eva, DO  [facility] Pager [phone]  If 7PM-7AM, please contact night-coverage [email or web address] Password [email or web address] 05/02/2017, 10:29 AM   LOS: 3 days

## 2017-05-03 LAB — CBC
HCT: 26.2 % — ABNORMAL LOW (ref 36.0–46.0)
HEMOGLOBIN: 8.3 g/dL — AB (ref 12.0–15.0)
MCH: 30.2 pg (ref 26.0–34.0)
MCHC: 31.7 g/dL (ref 30.0–36.0)
MCV: 95.3 fL (ref 78.0–100.0)
Platelets: 506 10*3/uL — ABNORMAL HIGH (ref 150–400)
RBC: 2.75 MIL/uL — AB (ref 3.87–5.11)
RDW: 16.4 % — ABNORMAL HIGH (ref 11.5–15.5)
WBC: 17.6 10*3/uL — ABNORMAL HIGH (ref 4.0–10.5)

## 2017-05-03 LAB — URINALYSIS, COMPLETE (UACMP) WITH MICROSCOPIC
BACTERIA UA: NONE SEEN
BILIRUBIN URINE: NEGATIVE
GLUCOSE, UA: NEGATIVE mg/dL
Hgb urine dipstick: NEGATIVE
Ketones, ur: NEGATIVE mg/dL
LEUKOCYTES UA: NEGATIVE
NITRITE: NEGATIVE
PH: 7 (ref 5.0–8.0)
Protein, ur: NEGATIVE mg/dL
RBC / HPF: NONE SEEN RBC/hpf (ref 0–5)
SPECIFIC GRAVITY, URINE: 1.01 (ref 1.005–1.030)

## 2017-05-03 MED ORDER — HYDROMORPHONE HCL 1 MG/ML IJ SOLN
1.0000 mg | INTRAMUSCULAR | Status: DC | PRN
  Administered 2017-05-03 – 2017-05-07 (×10): 1 mg via INTRAVENOUS
  Filled 2017-05-03 (×10): qty 1

## 2017-05-03 NOTE — Care Management (Signed)
Late Entry: Cm notified late Friday (04/30/2017) afternoon that [facility] would not accept pt back to their services due to past non-compliance, pt not safe to be at home and instability of wound.

## 2017-05-03 NOTE — Progress Notes (Signed)
PROGRESS NOTE  Ann Oliver CZY:[id number] DOB: 04-Sep-1955 DOA: 04/29/2017 PCP: [facility]  Brief History: 61 year old female with a history of metastatic breast cancer, malnutrition, and blood loss anemia [facility]due to bleeding from her left fungating breast mass. The patient has had numerous hospital admissions in the last 6 months secondary to bleeding from her left-sided breast mass. The patient was sent to the emergency department because of concerns for bleeding from her wound site. Most recently, the patient was admitted to [facility] to bleeding from her breast mass. The patient was seen by general surgery, the wound care team, palliative medicine, psychiatry, and her medical oncologist [name]).The patient was deemed to have capacity to make medical decisions. She wished to continue the full scope of care. She required complex wound care by the wound care team and the general surgical team. Most recently, the patient was transfused 2 units PRBC on April 27, 2017. After stabilization, the patient was ultimately discharged to stabilization [facility].The patient is a poor historian and very tangential, but it is been noted that the patient is essentially homeless. Recent CT of the abdomen and pelvis on April 20, 2017 largely showed progression of her metastatic disease with enlargement of her chest wall mass and increase in left subpectoral lymph nodes. There was also progression of her pulmonary metastasis. She was noted to have hemoglobin 6.7,but she remained hemodynamically stable.  The patient was transfused a total of 2 units PRBC during this hospital admission.   The patient was seen by general surgery whom changed the patient's dressing.  General surgery did not feel patient was a candidate for operative intervention at this time.  I spoke with radiation oncology, Dr. Tammi Klippel, who agreed to see the patient in  consultation for palliative radiation to control bleeding.  Assessment/Plan: Metastatic breast cancer--invasive adenocarcinoma -diagnosed in May 2017 -Medical oncologist is Dr.Vinay [name] -Last chemotherapy March 15, 2017 -April 20, 2017- CTabd/pelvisshows progression of metastatic disease -Previously seen by palliative medicine last admission-->refused hospice;continue full scope of care -Overall poor prognosis--Dr. Gudenarecommends hospice with comfort measures,but patient refuses -please see pictures from Dr. [name] note on 05/01/17 -pt previously offered mastectomy, but she refused--now no longer a candidate  Acute blood loss anemia/Bleeding breast mass -Secondary to fungating left breast mass -Baseline hemoglobin7-8 -transfused 2 units PRBC 12/4 and one unit 12/6 (previous admission) -transfused 2 units this admission -As documented by multiple specialists on the patient's medical care team--patient will continue to have high risk of rebleeding and recurrent hospitalizations and re-admission -she has been dismissed by numerous home health companies due to her poor compliance and not answering her phone for appointments -Monitor serial CBC -general surgeryconsult appreciated--case discussed with Dr. Dimple Casey need for repeated dressing changes as this will create more bleeding -dressing last changed on 05/01/17 -currently set up to have wound care changes at [facility] wound care center once per week -pt has been set up at [facility] wound care center on 05/07/17 at [facility] -no [facility] or SNF willing to take on patient's care -she continues to be high risk for life threatening bleeding and recurrent hospitalization/readmission -05/02/17--case discussed with radiation oncology, Dr. [name] agreed to see patient in consult and offer XRT as this may help control bleeding  ModerateMalnutrition -Nutrition consult -continue supplements  Cancer related pain -Continue OxyContin  and Percocet  leukocytosis -The patient has remained afebrile hemodynamically stable -Check lactic acid--3.5-->likely related to hypovolemic stated from blood loss anemia  rather than sepsis -Remain off antibiotics and monitor clinically-->has remained stable clinically -Likely stress demargination and metastatic cancer  GOC  -pt is DNR as noted on last palliative medicine consultation -pt adamant about refusing hospice -pt wants to go home--refuses SNF   Disposition Plan:Transfer to [place] Communication:NoFamily at bedside--Total time spent86minutes. Greater than 50% spent face to face counseling and coordinating care.   Consultants:General surgery, radiation oncology  Code Status: FULL   DVT Prophylaxis: SCDs   Procedures: As Listed in Progress Note Above  Antibiotics: None     Subjective: Patient denies fevers, chills, headache, chest pain, dyspnea, nausea, vomiting, diarrhea, abdominal pain, dysuria, hematuria, hematochezia, and melena.   Objective: Vitals:   05/02/17 0520 05/02/17 1500 05/02/17 2137 05/03/17 0609  BP: 112/63 (!) 99/52 (!) 125/59 113/60  Pulse: 85 75 95 90  Resp: 20 18 18 18   Temp: 98.2 F (36.8 C) 98.2 F (36.8 C) 98.2 F (36.8 C) 98.5 F (36.9 C)  TempSrc: Oral Oral Oral Tympanic  SpO2: 100% 100% 100% 100%  Weight:      Height:        Intake/Output Summary (Last 24 hours) at 05/03/2017 0851 Last data filed at 05/03/2017 1950 Gross per 24 hour  Intake 1340 ml  Output 300 ml  Net 1040 ml   Weight change:  Exam:   General:  Pt is alert, follows commands appropriately, not in acute distress  HEENT: No icterus, No thrush, No neck mass, [place]/AT  Cardiovascular: RRR, S1/S2, no rubs, no gallops  Respiratory: CTA bilaterally, no wheezing, no crackles, no rhonchi  Abdomen: Soft/+BS, non tender, non distended, no guarding  Extremities: trace LE edema, No lymphangitis, No petechiae, No  rashes, no synovitis   Data Reviewed: I have personally reviewed following labs and imaging studies Basic Metabolic Panel: Recent Labs  Lab 04/27/17 0554 04/29/17 1542 04/30/17 0634 05/01/17 0530  NA 136 136 134* 137  K 4.3 5.0 4.5 4.2  CL 102 102 101 106  CO2 29 26 27 26   GLUCOSE 95 136* 98 89  BUN 16 19 18 18   CREATININE 0.88 0.92 0.89 0.87  CALCIUM 8.2* 8.1* 8.0* 7.9*  MG  --   --   --  1.9   Liver Function Tests: No results for input(s): AST, ALT, ALKPHOS, BILITOT, PROT, ALBUMIN in the last 168 hours. No results for input(s): LIPASE, AMYLASE in the last 168 hours. No results for input(s): AMMONIA in the last 168 hours. Coagulation Profile: No results for input(s): INR, PROTIME in the last 168 hours. CBC: Recent Labs  Lab 04/29/17 1542 04/30/17 0634 05/01/17 0530 05/02/17 0518 05/03/17 0551  WBC 16.5* 16.6* 16.5* 16.3* 17.6*  NEUTROABS 13.6*  --   --   --   --   HGB 6.7* 7.1* 8.4* 8.5* 8.3*  HCT 21.4* 21.7* 25.4* 26.9* 26.2*  MCV 89.5 89.7 92.4 95.1 95.3  PLT 603* 455* 451* 496* 506*   Cardiac Enzymes: No results for input(s): CKTOTAL, CKMB, CKMBINDEX, TROPONINI in the last 168 hours. BNP: Invalid input(s): POCBNP CBG: No results for input(s): GLUCAP in the last 168 hours. HbA1C: No results for input(s): HGBA1C in the last 72 hours. Urine analysis:    Component Value Date/Time   COLORURINE STRAW (A) 05/02/2017 2100   APPEARANCEUR CLEAR 05/02/2017 2100   LABSPEC 1.010 05/02/2017 2100   PHURINE 7.0 05/02/2017 2100   GLUCOSEU NEGATIVE 05/02/2017 2100   HGBUR NEGATIVE 05/02/2017 2100   [place] NEGATIVE 05/02/2017 2100  [place] NEGATIVE 05/02/2017 2100   PROTEINUR NEGATIVE 05/02/2017 2100   NITRITE NEGATIVE 05/02/2017 2100   LEUKOCYTESUR NEGATIVE 05/02/2017 2100   Sepsis Labs: @LABRCNTIP (procalcitonin:4,lacticidven:4) ) Recent Results (from the past 240 hour(s))  MRSA PCR Screening     Status: None   Collection Time: 04/30/17 12:28 AM    Result Value Ref Range Status   MRSA by PCR NEGATIVE NEGATIVE Final    Comment:        The GeneXpert MRSA Assay (FDA approved for NASAL specimens only), is one component of a comprehensive MRSA colonization surveillance program. It is not intended to diagnose MRSA infection nor to guide or monitor treatment for MRSA infections.      Scheduled Meds: . docusate sodium  100 mg Oral BID  . feeding supplement (ENSURE ENLIVE)  237 mL Oral BID BM  . gabapentin  600 mg Oral TID  . mirtazapine  15 mg Oral QHS  . oxyCODONE  10 mg Oral Q12H  . polyethylene glycol  17 g Oral Daily   Continuous Infusions:  Procedures/Studies: Ct Chest W Contrast  Result Date: 04/21/2017 CLINICAL DATA:  Breast cancer. Restaging. Non osseous recurrent suspected. EXAM: CT CHEST, ABDOMEN, AND PELVIS WITH CONTRAST TECHNIQUE: Multidetector CT imaging of the chest, abdomen and pelvis was performed following the standard protocol during bolus administration of intravenous contrast. CONTRAST:  19mL ISOVUE-300 IOPAMIDOL (ISOVUE-300) INJECTION 61%, 17mL ISOVUE-300 IOPAMIDOL (ISOVUE-300) INJECTION 61% COMPARISON:  Plain film 03/18/2017.  10/28/2016 CT. FINDINGS: CT CHEST FINDINGS Cardiovascular: Aortic atherosclerosis. Mild cardiomegaly, without pericardial effusion. No central pulmonary embolism, on this non-dedicated study. Right-sided PICC line which terminates at the high right atrium. Mediastinum/Nodes: Left supraclavicular node of 9 mm on image 8/series 2 is similar. Well-circumscribed hypoattenuating right thyroid nodule is grossly similar and of doubtful clinical significance. left axillary nodes are decreased, with an index measuring 1.0 cm today versus 2.1 cm on the prior exam (when remeasured). A left subpectoral node measures 11 mm today versus 9 mm on the prior exam (when remeasured). No mediastinal or hilar adenopathy. Lungs/Pleura: Trace bilateral pleural fluid, new. Bilateral pulmonary nodules consistent with  metastasis. Index left upper lobe pulmonary nodule measures 1.4 cm on image 46/ series 4 versus 1.1 cm on the prior. An index right lower lobe pulmonary nodule measures 9 mm on image 88/series 4 versus 8 mm on the prior exam (when remeasured). Inferior lingular nodule measures 1.8 cm on image 84/ series 4 and is similar 1.9 cm on the prior. Subpleural left upper lobe 1.3 cm nodule on image 67/series 4 is new. Musculoskeletal: Left chest wall mass measures 15.2 x 6.2 by 17.2 cm today versus 11.4 x 8.3 cm by 14.7 on the prior exam (when remeasured). A mild compression deformity involving the superior endplate of Y17 is not significantly changed. CT ABDOMEN PELVIS FINDINGS Hepatobiliary: Segment 2 hypoattenuating lesion measures 11 mm on image 46/ series 2 versus 16 mm on the prior. No new liver lesion. Normal gallbladder. Upper normal common duct size, 7 mm. No obstructive cause identified. Pancreas: Normal, without mass or ductal dilatation. Spleen: Normal in size, without focal abnormality. Adrenals/Urinary Tract: Normal adrenal glands. 2.1 cm right renal cyst. Normal left kidney. No hydronephrosis. Normal urinary bladder. Stomach/Bowel: Normal stomach, without wall thickening. Colonic stool burden suggests constipation. Normal small bowel. Vascular/Lymphatic: Normal caliber of the aorta and branch vessels. Necrotic porta hepatis node of 1.9 x 2.5 cm on image 60/series 2. Similar to minimally enlarged from 1.8 x 2.2 on the prior exam. No  pelvic sidewall adenopathy. Reproductive: Suspect a small uterine fundal fibroid on image 102/series 2. No adnexal mass. Other: Mild pelvic floor laxity. No significant free fluid. Anasarca. Musculoskeletal: Probable degenerative sclerosis about the left sacroiliac joint, similar. IMPRESSION: 1. Mixed response, with overall disease progression. 2. Enlargement of left chest wall mass with improvement in left axillary but slight increase in left subpectoral adenopathy. Similar left  supraclavicular adenopathy. 3. Progression of pulmonary metastasis. 4. Improvement in isolated hepatic metastasis with enlargement of necrotic periportal adenopathy. 5. Small bilateral pleural effusions. 6.  Possible constipation. Electronically Signed   By: Abigail Miyamoto M.D.   On: 04/21/2017 10:04   Ct Abdomen Pelvis W Contrast  Result Date: 04/21/2017 CLINICAL DATA:  Breast cancer. Restaging. Non osseous recurrent suspected. EXAM: CT CHEST, ABDOMEN, AND PELVIS WITH CONTRAST TECHNIQUE: Multidetector CT imaging of the chest, abdomen and pelvis was performed following the standard protocol during bolus administration of intravenous contrast. CONTRAST:  176mL ISOVUE-300 IOPAMIDOL (ISOVUE-300) INJECTION 61%, 45mL ISOVUE-300 IOPAMIDOL (ISOVUE-300) INJECTION 61% COMPARISON:  Plain film 03/18/2017.  10/28/2016 CT. FINDINGS: CT CHEST FINDINGS Cardiovascular: Aortic atherosclerosis. Mild cardiomegaly, without pericardial effusion. No central pulmonary embolism, on this non-dedicated study. Right-sided PICC line which terminates at the high right atrium. Mediastinum/Nodes: Left supraclavicular node of 9 mm on image 8/series 2 is similar. Well-circumscribed hypoattenuating right thyroid nodule is grossly similar and of doubtful clinical significance. left axillary nodes are decreased, with an index measuring 1.0 cm today versus 2.1 cm on the prior exam (when remeasured). A left subpectoral node measures 11 mm today versus 9 mm on the prior exam (when remeasured). No mediastinal or hilar adenopathy. Lungs/Pleura: Trace bilateral pleural fluid, new. Bilateral pulmonary nodules consistent with metastasis. Index left upper lobe pulmonary nodule measures 1.4 cm on image 46/ series 4 versus 1.1 cm on the prior. An index right lower lobe pulmonary nodule measures 9 mm on image 88/series 4 versus 8 mm on the prior exam (when remeasured). Inferior lingular nodule measures 1.8 cm on image 84/ series 4 and is similar 1.9 cm on the  prior. Subpleural left upper lobe 1.3 cm nodule on image 67/series 4 is new. Musculoskeletal: Left chest wall mass measures 15.2 x 6.2 by 17.2 cm today versus 11.4 x 8.3 cm by 14.7 on the prior exam (when remeasured). A mild compression deformity involving the superior endplate of G83 is not significantly changed. CT ABDOMEN PELVIS FINDINGS Hepatobiliary: Segment 2 hypoattenuating lesion measures 11 mm on image 46/ series 2 versus 16 mm on the prior. No new liver lesion. Normal gallbladder. Upper normal common duct size, 7 mm. No obstructive cause identified. Pancreas: Normal, without mass or ductal dilatation. Spleen: Normal in size, without focal abnormality. Adrenals/Urinary Tract: Normal adrenal glands. 2.1 cm right renal cyst. Normal left kidney. No hydronephrosis. Normal urinary bladder. Stomach/Bowel: Normal stomach, without wall thickening. Colonic stool burden suggests constipation. Normal small bowel. Vascular/Lymphatic: Normal caliber of the aorta and branch vessels. Necrotic porta hepatis node of 1.9 x 2.5 cm on image 60/series 2. Similar to minimally enlarged from 1.8 x 2.2 on the prior exam. No pelvic sidewall adenopathy. Reproductive: Suspect a small uterine fundal fibroid on image 102/series 2. No adnexal mass. Other: Mild pelvic floor laxity. No significant free fluid. Anasarca. Musculoskeletal: Probable degenerative sclerosis about the left sacroiliac joint, similar. IMPRESSION: 1. Mixed response, with overall disease progression. 2. Enlargement of left chest wall mass with improvement in left axillary but slight increase in left subpectoral adenopathy. Similar left supraclavicular adenopathy. 3. Progression  of pulmonary metastasis. 4. Improvement in isolated hepatic metastasis with enlargement of necrotic periportal adenopathy. 5. Small bilateral pleural effusions. 6.  Possible constipation. Electronically Signed   By: Abigail Miyamoto M.D.   On: 04/21/2017 10:04    Orson Eva, DO  [facility] Pager [phone]  If 7PM-7AM, please contact night-coverage [email or web address] Password [email or web address] 05/03/2017, 8:51 AM   LOS: 4 days

## 2017-05-03 NOTE — Progress Notes (Signed)
Pt has oozing from L breast onto her ace wrap as well as abdomen. ABD pads placed x4 around and under ace wrap dressing and wrapped with Kerlix. Will continue to monitor drainage. Pt also informed we still need a urine sample to send down to lab. Pt stated that she is "going home" tomorrow so she doesn't understand why we need a urine specimen. RN adv pt that there is an order for her to transfer to [facility], but not home. RN adv pt to speak with MD about it in the am, but for now we still needed urine. Pt verbalized understanding.

## 2017-05-04 ENCOUNTER — Ambulatory Visit
Admit: 2017-05-04 | Discharge: 2017-05-04 | Disposition: A | Discharge status: Home / Self Care | Payer: Medicaid Other | Attending: Radiation Oncology | Admitting: Radiation Oncology

## 2017-05-04 DIAGNOSIS — C50919 Malignant neoplasm of unspecified site of unspecified female breast: Secondary | ICD-10-CM

## 2017-05-04 DIAGNOSIS — C50412 Malignant neoplasm of upper-outer quadrant of left female breast: Secondary | ICD-10-CM

## 2017-05-04 DIAGNOSIS — Z170 Estrogen receptor positive status [ER+]: Principal | ICD-10-CM

## 2017-05-04 NOTE — Consult Note (Signed)
Radiation Oncology         [phone] ________________________________  Initial inpatient Consultation  Name: Febe Champa MRN: [id number]  Date of Service: 04/29/2017 DOB: 1956/02/10  ZJ:[id number], [facility]  No ref. provider found   REFERRING PHYSICIAN: No ref. provider found  DIAGNOSIS: The primary encounter diagnosis was Acute blood loss anemia. Diagnoses of Breast mass and Hemorrhage from wound were also pertinent to this visit.    ICD-10-CM   1. Acute blood loss anemia D62   2. Breast mass N63.0   3. Hemorrhage from wound R58     HISTORY OF PRESENT ILLNESS: Jahnya Trindade is a 61 y.o. female seen at the request of Dr. Carles Collet.  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 She initially refused treatment, opting for homeopathic treatments instead. However, she did return to medical oncology in June 2018 and agreed to begin palliative chemotherapy. Her last dose of chemotherapy was delivered on 03/15/2017, she completed 5 of 6 scheduled cycles. She was somewhat noncompliant throughout her treatment due to socioeconomic issues.   The patient has required hospitalization on multiple occasions over the past 6 months due to bleeding from her left-sided breast mass. She was recently admitted to [facility] for this issue from 04/16/2017 through 04/28/2017, requiring multiple blood transfusions for significant acute blood loss anemia. During her admission, she was seen by general surgery, palliative medicine, psychiatry and her medical oncologist, [name]. CT imaging for disease restaging was performed 04/20/17 straightening disease progression of the lung metastases as well as a chest wall tumor. She was discharged to [facility] on 04/28/17 for further wound  care management, having declined the recommendation for hospice care but was sent back to the ER on 04/29/17 ongoing bleeding. She has been seen by general surgery who did not feel the patient was a candidate for operative intervention and has therefore continued to require complex wound care by the wound care team to maintain hemostasis.   We are asked to see the patient today to discuss the potential role for palliative radiotherapy to the left breast for hemostasis.  PREVIOUS RADIATION THERAPY: No  PAST MEDICAL HISTORY:  Past Medical History:  Diagnosis Date  . Breast cancer ([place])   . Sinusitis       PAST SURGICAL HISTORY: Past Surgical History:  Procedure Laterality Date  . BIOPSY BREAST    . IR FLUORO GUIDE CV LINE RIGHT  11/06/2016  . IR US GUIDE VASC ACCESS RIGHT  11/06/2016    FAMILY HISTORY:  Family History  Family history unknown: Yes    SOCIAL HISTORY:  Social History   Socioeconomic History  . Marital status: Single    Spouse name: Not on file  . Number of children: Not on file  . Years of education: Not on file  . Highest education level: Not on file  Social Needs  . Financial resource strain: Not on file  . Food insecurity - worry: Not on file  . Food insecurity - inability: Not on file  . Transportation needs - medical: Not on file  . Transportation needs - non-medical: Not on file  Occupational History  . Occupation: unemployed  Tobacco Use  . Smoking status: Never Smoker  . Smokeless tobacco: Never Used  Substance and Sexual Activity  . Alcohol  use: No  . Drug use: No  . Sexual activity: Not on file  Other Topics Concern  . Not on file  Social History Narrative  . Not on file    ALLERGIES: Aspirin  MEDICATIONS:  Current Facility-Administered Medications  Medication Dose Route Frequency Provider Last Rate Last Dose  . acetaminophen (TYLENOL) tablet 650 mg  650 mg Oral Q6H PRN Karmen Bongo, MD   650 mg at 04/30/17 1536   Or  .  acetaminophen (TYLENOL) suppository 650 mg  650 mg Rectal Q6H PRN Karmen Bongo, MD      . docusate sodium (COLACE) capsule 100 mg  100 mg Oral BID Karmen Bongo, MD   100 mg at 05/04/17 0904  . feeding supplement (ENSURE ENLIVE) (ENSURE ENLIVE) liquid 237 mL  237 mL Oral BID BM Orson Eva, MD   237 mL at 05/02/17 0811  . gabapentin (NEURONTIN) capsule 600 mg  600 mg Oral TID Karmen Bongo, MD   600 mg at 05/04/17 0904  . HYDROmorphone (DILAUDID) injection 1 mg  1 mg Intravenous Q4H PRN Schorr, Rhetta Mura, NP   1 mg at 05/04/17 1202  . mirtazapine (REMERON) tablet 15 mg  15 mg Oral Ivery Quale, MD   15 mg at 05/03/17 2139  . ondansetron (ZOFRAN) tablet 4 mg  4 mg Oral Q6H PRN Karmen Bongo, MD       Or  . ondansetron [facility] injection 4 mg  4 mg Intravenous Q6H PRN Karmen Bongo, MD   4 mg at 05/02/17 0803  . oxyCODONE (OXYCONTIN) 12 hr tablet 10 mg  10 mg Oral Q12H Karmen Bongo, MD   10 mg at 05/04/17 1157  . oxyCODONE-acetaminophen (PERCOCET/ROXICET) 5-325 MG per tablet 2 tablet  2 tablet Oral Q4H PRN Orson Eva, MD   2 tablet at 05/04/17 0904  . polyethylene glycol (MIRALAX / GLYCOLAX) packet 17 g  17 g Oral Daily Karmen Bongo, MD   17 g at 05/04/17 7829   Facility-Administered Medications Ordered in Other Encounters  Medication Dose Route Frequency Provider Last Rate Last Dose  . sodium chloride flush (NS) 0.9 % injection 10 mL  10 mL Intracatheter PRN Nicholas Lose, MD   10 mL at 12/25/16 1702    REVIEW OF SYSTEMS:  On review of systems, the patient reports that she is doing well overall. She is a poor historian and is quite fixed on discussing her bleeding from the left breast and the dressing material being used to control the bleeding that it is difficult to get her to discuss anything else but I am able to elicit simple yes/no answers to pointed questions.  She states that "all will be just fine once this bleeding gets stopped". She does report pain and burning in  the left breast, especially with dressing changes.  She denies any chest pain, shortness of breath, cough, fevers, chills, or night sweats. She reports persistent fatigue and decreased appetite.  She has lost weight recently (unsure of amount) which she attributes to the lack of appetite.  She denies any bowel or bladder disturbances, and denies abdominal pain, nausea or vomiting. She denies any new musculoskeletal or joint aches or pains. A complete review of systems is obtained and is otherwise negative.    PHYSICAL EXAM:  Wt Readings from Last 3 Encounters:  05/03/17 117 lb 11.6 oz (53.4 kg)  04/16/17 123 lb 10.9 oz (56.1 kg)  03/30/17 117 lb 12 oz (53.4 kg)   Temp Readings from Last 3  Encounters:  05/04/17 99.1 F (37.3 C) (Oral)  04/28/17 99.7 F (37.6 C) (Oral)  04/03/17 98.7 F (37.1 C) (Oral)   BP Readings from Last 3 Encounters:  05/04/17 101/72  04/28/17 107/70  04/03/17 112/65   Pulse Readings from Last 3 Encounters:  05/04/17 90  04/28/17 (!) 108  04/03/17 (!) 110   Pain Assessment Pain Score: 7 /10  In general this is a cachectic appearing african [place] female in no acute distress. She is sitting upright in bed and appears comfortable.  She is alert and oriented to person, place, time and circumstances for admission although she is tangential in thought throughout the examination. HEENT reveals that the patient is normocephalic, atraumatic. EOMs are intact. PERRLA. Skin is intact without any evidence of gross lesions aside from the known, fungating left breast mass.  There is extensive compression dressing covering the wound which was not removed today due to concerns for disrupting hemostasis. Per her documented exam by Dr. Curlene Labrum on 05/01/17, there is a large fungating bleeding breast cancer with induration of the upper outer quadrant of the left breast, fungating mass with at least 5cm of overhang from the exit point on the skin on the inferior and lateral  sides, bleeding after dressing removed.  Cardiovascular exam reveals a tachy rate with regular rhythm, no clicks rubs or murmurs are auscultated. Chest is clear to auscultation bilaterally. Lymphatic assessment is performed and does not reveal any adenopathy in the cervical or supraclavicular chains. Abdomen has active bowel sounds in all quadrants and is intact. The abdomen is soft, non tender, non distended. Lower extremities are positive for 1+ pretibial pitting edema, but no deep calf tenderness, cyanosis or clubbing.   KPS = 50  100 - Normal; no complaints; no evidence of disease. 90   - Able to carry on normal activity; minor signs or symptoms of disease. 80   - Normal activity with effort; some signs or symptoms of disease. 30   - Cares for self; unable to carry on normal activity or to do active work. 60   - Requires occasional assistance, but is able to care for most of his personal needs. 50   - Requires considerable assistance and frequent medical care. 73   - Disabled; requires special care and assistance. 7   - Severely disabled; hospital admission is indicated although death not imminent. 41   - Very sick; hospital admission necessary; active supportive treatment necessary. 10   - Moribund; fatal processes progressing rapidly. 0     - Dead  Karnofsky DA, Abelmann [place], Craver LS and Burchenal [facility] [phone]) The use of the nitrogen mustards in the palliative treatment of carcinoma: with particular reference to bronchogenic carcinoma Cancer 1 634-56  LABORATORY DATA:  Lab Results  Component Value Date   WBC 17.6 (H) 05/03/2017   HGB 8.3 (L) 05/03/2017   HCT 26.2 (L) 05/03/2017   MCV 95.3 05/03/2017   PLT 506 (H) 05/03/2017   Lab Results  Component Value Date   NA 137 05/01/2017   K 4.2 05/01/2017   CL 106 05/01/2017   CO2 26 05/01/2017   Lab Results  Component Value Date   ALT 19 04/22/2017   AST 32 04/22/2017   ALKPHOS 132 (H) 04/22/2017   BILITOT 0.4 04/22/2017       RADIOGRAPHY: Ct Chest W Contrast  Result Date: 04/21/2017 CLINICAL DATA:  Breast cancer. Restaging. Non osseous recurrent suspected. EXAM: CT CHEST, ABDOMEN, AND PELVIS WITH CONTRAST TECHNIQUE: Multidetector  CT imaging of the chest, abdomen and pelvis was performed following the standard protocol during bolus administration of intravenous contrast. CONTRAST:  121mL ISOVUE-300 IOPAMIDOL (ISOVUE-300) INJECTION 61%, 7mL ISOVUE-300 IOPAMIDOL (ISOVUE-300) INJECTION 61% COMPARISON:  Plain film 03/18/2017.  10/28/2016 CT. FINDINGS: CT CHEST FINDINGS Cardiovascular: Aortic atherosclerosis. Mild cardiomegaly, without pericardial effusion. No central pulmonary embolism, on this non-dedicated study. Right-sided PICC line which terminates at the high right atrium. Mediastinum/Nodes: Left supraclavicular node of 9 mm on image 8/series 2 is similar. Well-circumscribed hypoattenuating right thyroid nodule is grossly similar and of doubtful clinical significance. left axillary nodes are decreased, with an index measuring 1.0 cm today versus 2.1 cm on the prior exam (when remeasured). A left subpectoral node measures 11 mm today versus 9 mm on the prior exam (when remeasured). No mediastinal or hilar adenopathy. Lungs/Pleura: Trace bilateral pleural fluid, new. Bilateral pulmonary nodules consistent with metastasis. Index left upper lobe pulmonary nodule measures 1.4 cm on image 46/ series 4 versus 1.1 cm on the prior. An index right lower lobe pulmonary nodule measures 9 mm on image 88/series 4 versus 8 mm on the prior exam (when remeasured). Inferior lingular nodule measures 1.8 cm on image 84/ series 4 and is similar 1.9 cm on the prior. Subpleural left upper lobe 1.3 cm nodule on image 67/series 4 is new. Musculoskeletal: Left chest wall mass measures 15.2 x 6.2 by 17.2 cm today versus 11.4 x 8.3 cm by 14.7 on the prior exam (when remeasured). A mild compression deformity involving the superior endplate of K02 is not  significantly changed. CT ABDOMEN PELVIS FINDINGS Hepatobiliary: Segment 2 hypoattenuating lesion measures 11 mm on image 46/ series 2 versus 16 mm on the prior. No new liver lesion. Normal gallbladder. Upper normal common duct size, 7 mm. No obstructive cause identified. Pancreas: Normal, without mass or ductal dilatation. Spleen: Normal in size, without focal abnormality. Adrenals/Urinary Tract: Normal adrenal glands. 2.1 cm right renal cyst. Normal left kidney. No hydronephrosis. Normal urinary bladder. Stomach/Bowel: Normal stomach, without wall thickening. Colonic stool burden suggests constipation. Normal small bowel. Vascular/Lymphatic: Normal caliber of the aorta and branch vessels. Necrotic porta hepatis node of 1.9 x 2.5 cm on image 60/series 2. Similar to minimally enlarged from 1.8 x 2.2 on the prior exam. No pelvic sidewall adenopathy. Reproductive: Suspect a small uterine fundal fibroid on image 102/series 2. No adnexal mass. Other: Mild pelvic floor laxity. No significant free fluid. Anasarca. Musculoskeletal: Probable degenerative sclerosis about the left sacroiliac joint, similar. IMPRESSION: 1. Mixed response, with overall disease progression. 2. Enlargement of left chest wall mass with improvement in left axillary but slight increase in left subpectoral adenopathy. Similar left supraclavicular adenopathy. 3. Progression of pulmonary metastasis. 4. Improvement in isolated hepatic metastasis with enlargement of necrotic periportal adenopathy. 5. Small bilateral pleural effusions. 6.  Possible constipation. Electronically Signed   By: Abigail Miyamoto M.D.   On: 04/21/2017 10:04   Ct Abdomen Pelvis W Contrast  Result Date: 04/21/2017 CLINICAL DATA:  Breast cancer. Restaging. Non osseous recurrent suspected. EXAM: CT CHEST, ABDOMEN, AND PELVIS WITH CONTRAST TECHNIQUE: Multidetector CT imaging of the chest, abdomen and pelvis was performed following the standard protocol during bolus administration of  intravenous contrast. CONTRAST:  127mL ISOVUE-300 IOPAMIDOL (ISOVUE-300) INJECTION 61%, 61mL ISOVUE-300 IOPAMIDOL (ISOVUE-300) INJECTION 61% COMPARISON:  Plain film 03/18/2017.  10/28/2016 CT. FINDINGS: CT CHEST FINDINGS Cardiovascular: Aortic atherosclerosis. Mild cardiomegaly, without pericardial effusion. No central pulmonary embolism, on this non-dedicated study. Right-sided PICC line which terminates at the high  right atrium. Mediastinum/Nodes: Left supraclavicular node of 9 mm on image 8/series 2 is similar. Well-circumscribed hypoattenuating right thyroid nodule is grossly similar and of doubtful clinical significance. left axillary nodes are decreased, with an index measuring 1.0 cm today versus 2.1 cm on the prior exam (when remeasured). A left subpectoral node measures 11 mm today versus 9 mm on the prior exam (when remeasured). No mediastinal or hilar adenopathy. Lungs/Pleura: Trace bilateral pleural fluid, new. Bilateral pulmonary nodules consistent with metastasis. Index left upper lobe pulmonary nodule measures 1.4 cm on image 46/ series 4 versus 1.1 cm on the prior. An index right lower lobe pulmonary nodule measures 9 mm on image 88/series 4 versus 8 mm on the prior exam (when remeasured). Inferior lingular nodule measures 1.8 cm on image 84/ series 4 and is similar 1.9 cm on the prior. Subpleural left upper lobe 1.3 cm nodule on image 67/series 4 is new. Musculoskeletal: Left chest wall mass measures 15.2 x 6.2 by 17.2 cm today versus 11.4 x 8.3 cm by 14.7 on the prior exam (when remeasured). A mild compression deformity involving the superior endplate of V37 is not significantly changed. CT ABDOMEN PELVIS FINDINGS Hepatobiliary: Segment 2 hypoattenuating lesion measures 11 mm on image 46/ series 2 versus 16 mm on the prior. No new liver lesion. Normal gallbladder. Upper normal common duct size, 7 mm. No obstructive cause identified. Pancreas: Normal, without mass or ductal dilatation. Spleen:  Normal in size, without focal abnormality. Adrenals/Urinary Tract: Normal adrenal glands. 2.1 cm right renal cyst. Normal left kidney. No hydronephrosis. Normal urinary bladder. Stomach/Bowel: Normal stomach, without wall thickening. Colonic stool burden suggests constipation. Normal small bowel. Vascular/Lymphatic: Normal caliber of the aorta and branch vessels. Necrotic porta hepatis node of 1.9 x 2.5 cm on image 60/series 2. Similar to minimally enlarged from 1.8 x 2.2 on the prior exam. No pelvic sidewall adenopathy. Reproductive: Suspect a small uterine fundal fibroid on image 102/series 2. No adnexal mass. Other: Mild pelvic floor laxity. No significant free fluid. Anasarca. Musculoskeletal: Probable degenerative sclerosis about the left sacroiliac joint, similar. IMPRESSION: 1. Mixed response, with overall disease progression. 2. Enlargement of left chest wall mass with improvement in left axillary but slight increase in left subpectoral adenopathy. Similar left supraclavicular adenopathy. 3. Progression of pulmonary metastasis. 4. Improvement in isolated hepatic metastasis with enlargement of necrotic periportal adenopathy. 5. Small bilateral pleural effusions. 6.  Possible constipation. Electronically Signed   By: Abigail Miyamoto M.D.   On: 04/21/2017 10:04      IMPRESSION/PLAN: 80. 61 y.o. 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 Today, I talked to the patient about the findings and workup thus far. We discussed the natural history of metastatic breast cancer with fungating, bleeding mass and general treatment, highlighting the role of radiotherapy in the management for hemostasis. We discussed the available radiation techniques, and focused on the details of logistics and delivery. Dr. Tammi Klippel recommends palliative radiotherapy to the breast for hemostasis.  May consider a quad  shot of radiation consisting of 4 treatments delivered BID for two days on 12/12 and 12/13 to facilitate rapid response and hopefully stabilize for hospital discharge on 12/14. We reviewed the anticipated acute and late sequelae associated with radiation in this setting. The patient was encouraged to ask questions that were answered to her satisfaction. Sh e is in agreement and wishes to proceed with treatment planning.  She freely signed written consent which was placed in her medical chart. We will plan for CT simulation at 1 PM today.  We reviewed the anticipated acute and late sequelae associated with radiation in this setting. The patient was encouraged to ask questions that were answered to his/her satisfaction.    Nicholos Johns, PA-C

## 2017-05-04 NOTE — Progress Notes (Signed)
Patient ID: Ann Oliver, female   DOB: 12-Nov-1955, 61 y.o.   MRN: [id number]  PROGRESS NOTE    Ann Oliver  AOZ:[id number] DOB: 12-19-1955 DOA: 04/29/2017  PCP: Center, [facility]   Brief Narrative:  61 year old female with a history of metastatic breast cancer, malnutrition, and blood loss anemia [facility]due to bleeding from her left fungating breast mass. The patient has had numerous hospital admissions in the last 6 months secondary to bleeding from her left-sided breast mass. The patient was sent to the emergency department because of concerns for bleeding from her wound site. Most recently, the patient was admitted to [facility] to bleeding from her breast mass. The patient was seen by general surgery, the wound care team, palliative medicine, psychiatry, and her medical oncologist [name]).The patient was deemed to have capacity to make medical decisions. She wished to continue the full scope of care. She required complex wound care by the wound care team and the general surgical team. Most recently, the patient was transfused 2 units PRBC on April 27, 2017. After stabilization, the patient was ultimately discharged to stabilization [facility].The patient is a poor historian and very tangential, but it is been noted that the patient is essentially homeless. Recent CT of the abdomen and pelvis on April 20, 2017 largely showed progression of her metastatic disease with enlargement of her chest wall mass and increase in left subpectoral lymph nodes. There was also progression of her pulmonary metastasis. She was noted to have hemoglobin 6.7,but she remained hemodynamically stable. The patient was transfused a total of2units PRBC during this hospital admission. The patient was seen by general surgery whom changed the patient's dressing. General surgery did not feel patient was a candidate for operative intervention at this time.  Dr.  Tammi Klippel, agreed to see the patient in consultation for palliative radiation to control bleeding.  Assessment & Plan:   Metastatic breast cancer--invasive adenocarcinoma - Follow up with [name] - Last chemo 03/15/2017 - Appreciate rad onc seeing the pt in consultation for possible radiation and control of bleeding  - April 20, 2017- CTabd/pelvisshows progression of metastatic disease - Overall poor prognosis--Dr. Gudenarecommends hospice with comfort measures,but patient refuses - Pt previously refused mastectomy but now no longer a candidate for surgery  - Palliative following for goals of care   Acute blood loss anemia/Bleeding breast mass - Secondary to fungating left breast mass - Baseline hemoglobin7-8 - Transfused 2 U PRBC 12/4, 1 U PRBC 12/6 (previous admission) - Transfused 2 U PRBC during this admission - Per surgery no need for frequent dressing changes as it will cause more bleeding (change once per week) - Rad onc consulted to see if radiation would help control the bleed    Moderate protein calorie malnutrition - In the context of chronic illness - Diet as tolerated   Cancer related pain - Continue pain management efforts   Leukocytosis - Remains off of abx and monitored clinically - No fevers  GOC  - Appreciate palliative care for help with goals of care     DVT prophylaxis: SCD's Code Status: full code  Family Communication: no family at the bedside this am Disposition Plan: not yet stable for discharge    Consultants:   Palliative care  Procedures:   None   Antimicrobials:      Subjective: No overnight events.  Objective: Vitals:   05/03/17 1012 05/03/17 1500 05/03/17 1930 05/04/17 0445  BP:  105/61 110/64 101/72  Pulse:  93 68 90  Resp:  19 18 16   Temp:  98.5 F (36.9 C) 99.1 F (37.3 C) 99.1 F (37.3 C)  TempSrc:  Oral Oral Oral  SpO2: 100% 100%  100%  Weight:   53.4 kg (117 lb 11.6 oz)   Height:   5\' 2"  (1.575 m)      Intake/Output Summary (Last 24 hours) at 05/04/2017 1231 Last data filed at 05/04/2017 0446 Gross per 24 hour  Intake 715 ml  Output -  Net 715 ml   Filed Weights   04/29/17 1411 05/03/17 1930  Weight: 54 kg (119 lb) 53.4 kg (117 lb 11.6 oz)    Examination:  General exam: Appears calm and comfortable  Respiratory system: Clear to auscultation. Respiratory effort normal. Cardiovascular system: S1 & S2 heard, RRR. No JVD Gastrointestinal system: Abdomen is nondistended, soft and nontender. No organomegaly or masses felt. Normal bowel sounds heard. Central nervous system: Alert and oriented. No focal neurological deficits. Extremities: Symmetric 5 x 5 power. Skin: breast wounds, dressing over the left breast  Psychiatry: Judgement and insight appear normal. Mood & affect appropriate.   Data Reviewed: I have personally reviewed following labs and imaging studies  CBC: Recent Labs  Lab 04/29/17 1542 04/30/17 0634 05/01/17 0530 05/02/17 0518 05/03/17 0551  WBC 16.5* 16.6* 16.5* 16.3* 17.6*  NEUTROABS 13.6*  --   --   --   --   HGB 6.7* 7.1* 8.4* 8.5* 8.3*  HCT 21.4* 21.7* 25.4* 26.9* 26.2*  MCV 89.5 89.7 92.4 95.1 95.3  PLT 603* 455* 451* 496* 277*   Basic Metabolic Panel: Recent Labs  Lab 04/29/17 1542 04/30/17 0634 05/01/17 0530  NA 136 134* 137  K 5.0 4.5 4.2  CL 102 101 106  CO2 26 27 26   GLUCOSE 136* 98 89  BUN 19 18 18   CREATININE 0.92 0.89 0.87  CALCIUM 8.1* 8.0* 7.9*  MG  --   --  1.9   GFR: Estimated Creatinine Clearance: 53 mL/min (by C-G formula based on SCr of 0.87 mg/dL). Liver Function Tests: No results for input(s): AST, ALT, ALKPHOS, BILITOT, PROT, ALBUMIN in the last 168 hours. No results for input(s): LIPASE, AMYLASE in the last 168 hours. No results for input(s): AMMONIA in the last 168 hours. Coagulation Profile: No results for input(s): INR, PROTIME in the last 168 hours. Cardiac Enzymes: No results for input(s): CKTOTAL, CKMB,  CKMBINDEX, TROPONINI in the last 168 hours. BNP (last 3 results) No results for input(s): PROBNP in the last 8760 hours. HbA1C: No results for input(s): HGBA1C in the last 72 hours. CBG: No results for input(s): GLUCAP in the last 168 hours. Lipid Profile: No results for input(s): CHOL, HDL, LDLCALC, TRIG, CHOLHDL, LDLDIRECT in the last 72 hours. Thyroid Function Tests: No results for input(s): TSH, T4TOTAL, FREET4, T3FREE, THYROIDAB in the last 72 hours. Anemia Panel: No results for input(s): VITAMINB12, FOLATE, FERRITIN, TIBC, IRON, RETICCTPCT in the last 72 hours. Urine analysis:    Component Value Date/Time   COLORURINE STRAW (A) 05/02/2017 2100   APPEARANCEUR CLEAR 05/02/2017 2100   LABSPEC 1.010 05/02/2017 2100   PHURINE 7.0 05/02/2017 2100   GLUCOSEU NEGATIVE 05/02/2017 2100   HGBUR NEGATIVE 05/02/2017 2100   BILIRUBINUR NEGATIVE 05/02/2017 2100   [place] NEGATIVE 05/02/2017 2100   PROTEINUR NEGATIVE 05/02/2017 2100   NITRITE NEGATIVE 05/02/2017 2100   LEUKOCYTESUR NEGATIVE 05/02/2017 2100   Sepsis Labs: @LABRCNTIP (procalcitonin:4,lacticidven:4)    MRSA PCR Screening     Status: None   Collection Time: 04/30/17 12:28 AM  Result Value Ref Range Status   MRSA by PCR NEGATIVE NEGATIVE Final      Radiology Studies: No results found.   Scheduled Meds: . docusate sodium  100 mg Oral BID  . feeding supplement (ENSURE ENLIVE)  237 mL Oral BID BM  . gabapentin  600 mg Oral TID  . mirtazapine  15 mg Oral QHS  . oxyCODONE  10 mg Oral Q12H  . polyethylene glycol  17 g Oral Daily   Continuous Infusions:   LOS: 5 days    Time spent: 25 minutes  Greater than 50% of the time spent on counseling and coordinating the care.   Leisa Lenz, MD [facility] Pager [phone]  If 7PM-7AM, please contact night-coverage [email or web address] Password [facility] 05/04/2017, 12:31 PM

## 2017-05-04 NOTE — Progress Notes (Signed)
  Radiation Oncology         [phone] ________________________________  Name: Ann Oliver MRN: [id number]  Date: 04/16/2017  DOB: Jan 27, 1956  Chart Note:  Transfer to [facility] noted.  We will assess patient today and set-up palliative radiotherapy to the breast for hemostasis.  May consider quad shot of radiation consisting of 4 treatments delivered BID for two days on 12/12 and 12/13 to facilitate rapid response and hopefully stabilize for hospital discharge on 12/14.  ________________________________  Sheral Apley. Tammi Klippel, M.D.

## 2017-05-04 NOTE — Progress Notes (Signed)
I have spoken with Eleanora Neighbor our [facility] re. the breast wound. Pt is scheduled for eval for radiation. The dressing per Cecille Rubin should not be removed. I spoke with ? Amy in the simulation room  and ask  her to have the  Radiology MD to call Curlene Labrum MD at [facility] and to have him review all notes and pictures of this wound

## 2017-05-04 NOTE — Plan of Care (Signed)
  Pain Managment: General experience of comfort will improve Description Patient will be assessed and monitored for pain; pain control measures will be implemented.    05/04/2017 0254 - Progressing by Mickie Kay, RN

## 2017-05-04 NOTE — Consult Note (Signed)
Consultation Note Date: 05/04/2017   Patient Name: Ann Oliver  DOB: [birth year]  MRN: [id number]  Age / Sex: 61 y.o., female  PCP: [facility], [facility] Referring Physician: Rosita Fire, MD  Reason for Consultation: Establishing goals of care  HPI/Patient Profile: 61 y.o. female  admitted on 04/29/2017    Clinical Assessment and Goals of Care:  61 year old lady well known to the palliative service from previous consultation, life limiting illness of metastatic breast cancer, invasive adenocarcinoma, extensive breast wound, ongoing acute blood loss anemia from bleeding breast wound, recently readmitted to the hospital.  Patient is currently transferred from [facility] to [facility] to undergo radiation oncology consultation. A palliative consultation has been requested for ongoing goals of care discussions. At the time of initial palliative consultation and a previous hospitalization, the patient had elected for DO NOT RESUSCITATE and was agreeable to hospice philosophy of care however later on did not wish to pursue hospice. After consultation with psychiatry the patient was deemed to have capacity to make medical decisions.  [name] is resting in bed. She is on the phone trying to order herself breakfast. She tries to discuss extensively about the differences between hot tea versus coffee. I was able to redirect our conversation back to her hospitalizations and back to the serious nature of her illness. We discussed about what would be most important to her. Her goals are not comfort only her goals are not hospice at this point. She understands very clearly why she is in the hospital and what her serious illness is all about.  We will continue gentle conversations, continue to assess pain and non-pain symptom management needs. See recommendations below. Thank you  for the consult.  NEXT OF KIN  patient has adult children that she doesn't want involved in her care.   SUMMARY OF RECOMMENDATIONS    full code/full scope Patient is aware of meeting with rad onc. She doesn't want SNF on D/C, wants to go home, she is agreeable to home based wound care.  Continue current pain and non pain regimen, continue to monitor.  Chaplain consult for additional support.  Continue to monitor disease trajectory.   Code Status/Advance Care Planning:  Full code.     Symptom Management:    continue current mode of care.   Palliative Prophylaxis:   Bowel Regimen  Additional Recommendations (Limitations, Scope, Preferences):  Full Scope Treatment  Psycho-social/Spiritual:   Desire for further Chaplaincy support:yes  Additional Recommendations: Caregiving  Support/Resources  Prognosis:   Unable to determine  Discharge Planning: To Be Determined      Primary Diagnoses: Present on Admission: . (Resolved) Breast cancer ([place]) . Breast cancer of upper-outer quadrant of left female breast ([place]) . (Resolved) Acute bleeding . (Resolved) Anemia . Blood loss anemia . Malnutrition of moderate degree . Metastatic breast cancer ([place])   I have reviewed the medical record, interviewed the patient and family, and examined the patient. The following aspects are pertinent.  Past Medical History:  Diagnosis Date  .  Breast cancer ([place])   . Sinusitis    Social History   Socioeconomic History  . Marital status: Single    Spouse name: None  . Number of children: None  . Years of education: None  . Highest education level: None  Social Needs  . Financial resource strain: None  . Food insecurity - worry: None  . Food insecurity - inability: None  . Transportation needs - medical: None  . Transportation needs - non-medical: None  Occupational History  . Occupation: unemployed  Tobacco Use  . Smoking status: Never Smoker  . Smokeless tobacco: Never  Used  Substance and Sexual Activity  . Alcohol use: No  . Drug use: No  . Sexual activity: None  Other Topics Concern  . None  Social History Narrative  . None   Family History  Family history unknown: Yes   Scheduled Meds: . docusate sodium  100 mg Oral BID  . feeding supplement (ENSURE ENLIVE)  237 mL Oral BID BM  . gabapentin  600 mg Oral TID  . mirtazapine  15 mg Oral QHS  . oxyCODONE  10 mg Oral Q12H  . polyethylene glycol  17 g Oral Daily   Continuous Infusions: PRN Meds:.acetaminophen **OR** acetaminophen, HYDROmorphone (DILAUDID) injection, ondansetron **OR** ondansetron (ZOFRAN) IV, oxyCODONE-acetaminophen Medications Prior to Admission:  Prior to Admission medications   Medication Sig Start Date End Date Taking? Authorizing Provider  ANUCORT-HC 25 MG suppository unwrap and insert 1 suppository rectally twice a day if needed for HEMORRHOIDS OR ANAL ITCHING 04/12/17  Yes Causey, Charlestine Massed, NP  gabapentin (NEURONTIN) 600 MG tablet Take 1 tablet (600 mg total) 3 (three) times daily by mouth. 04/05/17  Yes Nicholas Lose, MD  mirtazapine (REMERON) 15 MG tablet Take 15 mg by mouth at bedtime.   Yes [provider]  ondansetron (ZOFRAN) 8 MG tablet take 1 tablet by mouth every 8 hours if needed for nausea and vomiting 04/13/17  Yes Causey, Charlestine Massed, NP  oxyCODONE (OXYCONTIN) 10 mg 12 hr tablet Take 1 tablet (10 mg total) by mouth every 12 (twelve) hours. 04/28/17  Yes Rosita Fire, MD  oxyCODONE-acetaminophen (PERCOCET/ROXICET) 5-325 MG tablet Take 2 tablets by mouth every 6 (six) hours as needed for severe pain. 04/28/17  Yes Rosita Fire, MD  polyethylene glycol [facility] / Floria Raveling) packet Take 17 g by mouth daily. 04/28/17  Yes Rosita Fire, MD   Allergies  Allergen Reactions  . Aspirin Palpitations   Review of Systems +mild pain in chest.   Physical Exam Awake alert oriented sitting up in bed not in any acute  distress Regular work of breathing S1-S2 Abdomen soft nontender Chest wall has dressing, appears dry No edema Thin elderly appearing lady Non focal   Vital Signs: BP 101/72 (BP Location: Right Arm)   Pulse 90   Temp 99.1 F (37.3 C) (Oral)   Resp 16   Ht 5\' 2"  (1.575 m)   Wt 53.4 kg (117 lb 11.6 oz)   SpO2 100%   BMI 21.53 kg/m  Pain Assessment: 0-10 POSS *See Group Information*: 1-Acceptable,Awake and alert Pain Score: 9    SpO2: SpO2: 100 % O2 Device:SpO2: 100 % O2 Flow Rate: .   IO: Intake/output summary:   Intake/Output Summary (Last 24 hours) at 05/04/2017 1048 Last data filed at 05/04/2017 0446 Gross per 24 hour  Intake 955 ml  Output -  Net 955 ml    LBM: Last BM Date: 05/02/17 Baseline Weight: Weight: 54  kg (119 lb) Most recent weight: Weight: 53.4 kg (117 lb 11.6 oz)     Palliative Assessment/Data:     Time In:  10 Time Out:  11 Time Total:  60 min  Greater than 50%  of this time was spent counseling and coordinating care related to the above assessment and plan.  Signed by: Loistine Chance, MD  [phone]  Please contact Palliative Medicine Team phone at [phone] for questions and concerns.  For individual provider: See Shea Evans

## 2017-05-04 NOTE — Progress Notes (Signed)
  Radiation Oncology         (336) [phone] ________________________________  Name: Ann Oliver MRN: [id number]  Date: 05/04/2017  DOB: [birth year]  SIMULATION AND TREATMENT PLANNING NOTE    ICD-10-CM   1. 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  NARRATIVE:  The patient was brought to the [facility].  Identity was confirmed.  All relevant records and images related to the planned course of therapy were reviewed.  The patient freely provided informed written consent to proceed with treatment after reviewing the details related to the planned course of therapy. The consent form was witnessed and verified by the simulation staff.  Then, the patient was set-up in a stable reproducible  supine position for radiation therapy.  CT images were obtained.  Surface markings were placed.  The CT images were loaded into the planning software.  Then the target and avoidance structures were contoured.  Treatment planning then occurred.  The radiation prescription was entered and confirmed.  Then, I designed and supervised the construction of a total of 5 medically necessary complex treatment devices with BodyFix positioner and 4 MLCs to shield heart and lungs.  I have requested : 3D Simulation  I have requested a DVH of the following structures: heart, left lung, right lung and target.    PLAN:  The patient will receive 14.8 Gy in 4 BID fractions of 3.7 Gy to complete in afternoon of 05/06/17, and then re-eval as outpatient in 3 weeks for response and possible further palliative RT.  ________________________________  Sheral Apley. Tammi Klippel, M.D.

## 2017-05-05 ENCOUNTER — Ambulatory Visit
Admit: 2017-05-05 | Discharge: 2017-05-05 | Disposition: A | Discharge status: Home / Self Care | Payer: Medicaid Other | Attending: Radiation Oncology | Admitting: Radiation Oncology

## 2017-05-05 DIAGNOSIS — Z515 Encounter for palliative care: Secondary | ICD-10-CM

## 2017-05-05 DIAGNOSIS — Z7189 Other specified counseling: Secondary | ICD-10-CM

## 2017-05-05 LAB — URINE CULTURE

## 2017-05-05 LAB — BASIC METABOLIC PANEL
ANION GAP: 9 (ref 5–15)
BUN: 15 mg/dL (ref 6–20)
CO2: 27 mmol/L (ref 22–32)
Calcium: 9 mg/dL (ref 8.9–10.3)
Chloride: 100 mmol/L — ABNORMAL LOW (ref 101–111)
Creatinine, Ser: 0.76 mg/dL (ref 0.44–1.00)
GFR calc Af Amer: >60 mL/min (ref >60)
GLUCOSE: 100 mg/dL — AB (ref 65–99)
POTASSIUM: 4.7 mmol/L (ref 3.5–5.1)
Sodium: 136 mmol/L (ref 135–145)

## 2017-05-05 MED ORDER — ADULT MULTIVITAMIN W/MINERALS CH
1.0000 | ORAL_TABLET | Freq: Every day | ORAL | Status: DC
  Administered 2017-05-05 – 2017-05-07 (×3): 1 via ORAL
  Filled 2017-05-05 (×3): qty 1

## 2017-05-05 MED ORDER — OXYCODONE HCL ER 15 MG PO T12A
15.0000 mg | EXTENDED_RELEASE_TABLET | Freq: Two times a day (BID) | ORAL | Status: DC
  Administered 2017-05-05 – 2017-05-06 (×3): 15 mg via ORAL
  Filled 2017-05-05 (×4): qty 1

## 2017-05-05 MED ORDER — HYDROMORPHONE HCL 1 MG/ML IJ SOLN
1.0000 mg | Freq: Four times a day (QID) | INTRAMUSCULAR | Status: AC
  Administered 2017-05-05 – 2017-05-07 (×8): 1 mg via INTRAVENOUS
  Filled 2017-05-05 (×8): qty 1

## 2017-05-05 MED ORDER — ENSURE ENLIVE PO LIQD
237.0000 mL | Freq: Three times a day (TID) | ORAL | Status: DC
  Administered 2017-05-06 – 2017-05-07 (×4): 237 mL via ORAL

## 2017-05-05 NOTE — Progress Notes (Signed)
Patient ID: Ann Oliver, female   DOB: Jun 27, 1955, 61 y.o.   MRN: [id number]  PROGRESS NOTE    Ann Oliver  RDE:[id number] DOB: September 13, 1955 DOA: 04/29/2017  PCP: Center, [facility]   Brief Narrative:  61 year old female with a history of metastatic breast cancer, malnutrition, and blood loss anemia [facility]due to bleeding from her left fungating breast mass. The patient has had numerous hospital admissions in the last 6 months secondary to bleeding from her left-sided breast mass. The patient was sent to the emergency department because of concerns for bleeding from her wound site. Most recently, the patient was admitted to [facility] to bleeding from her breast mass. The patient was seen by general surgery, the wound care team, palliative medicine, psychiatry, and her medical oncologist [name]).The patient was deemed to have capacity to make medical decisions. She wished to continue the full scope of care. She required complex wound care by the wound care team and the general surgical team. Most recently, the patient was transfused 2 units PRBC on April 27, 2017. After stabilization, the patient was ultimately discharged to stabilization [facility].The patient is a poor historian and very tangential, but it is been noted that the patient is essentially homeless. Recent CT of the abdomen and pelvis on April 20, 2017 largely showed progression of her metastatic disease with enlargement of her chest wall mass and increase in left subpectoral lymph nodes. There was also progression of her pulmonary metastasis. She was noted to have hemoglobin 6.7,but she remained hemodynamically stable. The patient was transfused a total of2units PRBC during this hospital admission. The patient was seen by general surgery whom changed the patient's dressing. General surgery did not feel patient was a candidate for operative intervention at this time.  Dr.  Tammi Klippel, agreed to see the patient in consultation for palliative radiation to control bleeding.  Assessment & Plan:   Metastatic breast cancer--invasive adenocarcinoma - Follow up with [name] - Last chemo 03/15/2017 - Appreciate rad onc seeing the pt in consultation for possible radiation and control of bleeding  - April 20, 2017- CTabd/pelvisshows progression of metastatic disease - Overall poor prognosis--Dr. Gudenarecommends hospice with comfort measures,but patient refuses - Pt previously refused mastectomy but now no longer a candidate for surgery  - Appreciate palliative care team addressing goals of care   Acute blood loss anemia/Bleeding breast mass - Secondary to fungating left breast mass - Baseline hemoglobin7-8 - Transfused 2 U PRBC 12/4, 1 U PRBC 12/6 (previous admission) - Transfused 2 U PRBC during this admission - Per surgery no need for frequent dressing changes as it will cause more bleeding (change once per week) - Appreciate rad onc recommendations, plan for palliative radiation to left breast for hemostasis  Moderate protein calorie malnutrition - In the context of chronic illness - Encouraged diet  Cancer related pain - Continue pain management   Leukocytosis - Remains off of abx and monitored clinically - No fevers     DVT prophylaxis: SCD"s Code Status: full code  Family Communication: no family at the bedside  Disposition Plan: plan for radiation 12/13   Consultants:   PCT  Rad onc  Procedures:   None   Antimicrobials:   None    Subjective: No overnight events.  Objective: Vitals:   05/04/17 0445 05/04/17 1420 05/04/17 2014 05/05/17 0446  BP: 101/72 124/87 118/82 108/68  Pulse: 90 100 98 93  Resp: 16 16 18 18   Temp: 99.1 F (37.3 C) 98 F (36.7 C) 98.5  F (36.9 C) 98.2 F (36.8 C)  TempSrc: Oral Oral Oral Oral  SpO2: 100% 100% 100% 100%  Weight:      Height:        Intake/Output Summary (Last 24 hours)  at 05/05/2017 0820 Last data filed at 05/05/2017 0300 Gross per 24 hour  Intake 720 ml  Output -  Net 720 ml   Filed Weights   04/29/17 1411 05/03/17 1930  Weight: 54 kg (119 lb) 53.4 kg (117 lb 11.6 oz)    Physical Exam  Constitutional: Appears well-developed and well-nourished. No distress.  CVS: RRR, S1/S2 + Pulmonary: Effort and breath sounds normal, no stridor, rhonchi, wheezes, rales.  Abdominal: Soft. BS +,  no distension, tenderness, rebound or guarding.  Musculoskeletal: Normal range of motion. No edema and no tenderness.  Lymphadenopathy: No lymphadenopathy noted, cervical, inguinal. Neuro: Alert. Normal reflexes, muscle tone coordination. No cranial nerve deficit. Skin: Left breast wound with dressing in place Psychiatric: Normal mood and affect. Behavior, judgment, thought content normal.     Data Reviewed: I have personally reviewed following labs and imaging studies  CBC: Recent Labs  Lab 04/29/17 1542 04/30/17 0634 05/01/17 0530 05/02/17 0518 05/03/17 0551  WBC 16.5* 16.6* 16.5* 16.3* 17.6*  NEUTROABS 13.6*  --   --   --   --   HGB 6.7* 7.1* 8.4* 8.5* 8.3*  HCT 21.4* 21.7* 25.4* 26.9* 26.2*  MCV 89.5 89.7 92.4 95.1 95.3  PLT 603* 455* 451* 496* 932*   Basic Metabolic Panel: Recent Labs  Lab 04/29/17 1542 04/30/17 0634 05/01/17 0530  NA 136 134* 137  K 5.0 4.5 4.2  CL 102 101 106  CO2 26 27 26   GLUCOSE 136* 98 89  BUN 19 18 18   CREATININE 0.92 0.89 0.87  CALCIUM 8.1* 8.0* 7.9*  MG  --   --  1.9   GFR: Estimated Creatinine Clearance: 53 mL/min (by C-G formula based on SCr of 0.87 mg/dL). Liver Function Tests: No results for input(s): AST, ALT, ALKPHOS, BILITOT, PROT, ALBUMIN in the last 168 hours. No results for input(s): LIPASE, AMYLASE in the last 168 hours. No results for input(s): AMMONIA in the last 168 hours. Coagulation Profile: No results for input(s): INR, PROTIME in the last 168 hours. Cardiac Enzymes: No results for input(s):  CKTOTAL, CKMB, CKMBINDEX, TROPONINI in the last 168 hours. BNP (last 3 results) No results for input(s): PROBNP in the last 8760 hours. HbA1C: No results for input(s): HGBA1C in the last 72 hours. CBG: No results for input(s): GLUCAP in the last 168 hours. Lipid Profile: No results for input(s): CHOL, HDL, LDLCALC, TRIG, CHOLHDL, LDLDIRECT in the last 72 hours. Thyroid Function Tests: No results for input(s): TSH, T4TOTAL, FREET4, T3FREE, THYROIDAB in the last 72 hours. Anemia Panel: No results for input(s): VITAMINB12, FOLATE, FERRITIN, TIBC, IRON, RETICCTPCT in the last 72 hours. Urine analysis:    Component Value Date/Time   COLORURINE STRAW (A) 05/02/2017 2100   APPEARANCEUR CLEAR 05/02/2017 2100   LABSPEC 1.010 05/02/2017 2100   PHURINE 7.0 05/02/2017 2100   GLUCOSEU NEGATIVE 05/02/2017 2100   HGBUR NEGATIVE 05/02/2017 2100   BILIRUBINUR NEGATIVE 05/02/2017 2100   [place] NEGATIVE 05/02/2017 2100   PROTEINUR NEGATIVE 05/02/2017 2100   NITRITE NEGATIVE 05/02/2017 2100   LEUKOCYTESUR NEGATIVE 05/02/2017 2100   Sepsis Labs: @LABRCNTIP (procalcitonin:4,lacticidven:4)    MRSA PCR Screening     Status: None   Collection Time: 04/30/17 12:28 AM  Result Value Ref Range Status   MRSA by  PCR NEGATIVE NEGATIVE Final      Radiology Studies: No results found.   Scheduled Meds: . docusate sodium  100 mg Oral BID  . feeding supplement (ENSURE ENLIVE)  237 mL Oral BID BM  . gabapentin  600 mg Oral TID  . mirtazapine  15 mg Oral QHS  . oxyCODONE  10 mg Oral Q12H  . polyethylene glycol  17 g Oral Daily   Continuous Infusions:   LOS: 6 days    Time spent: 25 minutes  Greater than 50% of the time spent on counseling and coordinating the care.   Leisa Lenz, MD [facility] Pager [phone]  If 7PM-7AM, please contact night-coverage [email or web address] Password [email or web address] 05/05/2017, 8:20 AM

## 2017-05-05 NOTE — Plan of Care (Signed)
  Pain Managment: General experience of comfort will improve Description Patient will be assessed and monitored for pain; pain control measures will be implemented.    05/05/2017 0056 - Progressing by Mickie Kay, RN

## 2017-05-05 NOTE — Progress Notes (Signed)
   05/05/17 1200  Clinical Encounter Type  Visited With Patient  Visit Type Follow-up;Psychological support;Spiritual support  Referral From Nurse  Consult/Referral To Chaplain  Spiritual Encounters  Spiritual Needs Emotional;Other (Comment) (Spiritual Care conversation/Support)  Stress Factors  Patient Stress Factors Health changes;Major life changes;Other (Comment) (Discharge concerns )   I visited with the patient per referral from the nurse and as a follow-up from our previous encounters.  The patient was anxious today over her belief that some people earlier in the day were being, "negative."  I was able to identify that the patient does not want to think negatively about her health condition, and that this is barrier for her when discussing goals of care.  The patient stated that she did not want hospice, and that she wants to go home after discharge.  When asked about her emotional health, she claims that she is doing "ok."  When talking about support she says that she doesn't want to be around, "family or people" who are negative.   I will continue to follow-up with the patient.   Please, contact Spiritual Care for further assistance.   [place] M.Div.

## 2017-05-05 NOTE — Progress Notes (Signed)
Date: May 05, 2017 Velva Harman, BSN, [place], [place] Chart and notes review for patient progress and needs. Will follow for case management and discharge needs. Next review date: [id number]

## 2017-05-05 NOTE — Progress Notes (Signed)
Daily Progress Note   Patient Name: Ann Oliver       Date: 05/05/2017 DOB: 1956/01/27  Age: 61 y.o. MRN#: [id number] Attending Physician: Robbie Lis, MD Primary Care Physician: Center, [facility] Admit Date: 04/29/2017  Reason for Consultation/Follow-up: Establishing goals of care  Subjective:  patient is sitting up in bed, she states she has started radiation Has a lot pf pain in her wound/L side of her arm/shoulder today.  Is tolerating POs Discussed with RN.  "I want you to do what you can for me."  Length of Stay: 6  Current Medications: Scheduled Meds:  . docusate sodium  100 mg Oral BID  . feeding supplement (ENSURE ENLIVE)  237 mL Oral BID BM  . gabapentin  600 mg Oral TID  .  HYDROmorphone (DILAUDID) injection  1 mg Intravenous Q6H  . mirtazapine  15 mg Oral QHS  . oxyCODONE  15 mg Oral Q12H  . polyethylene glycol  17 g Oral Daily    Continuous Infusions:   PRN Meds: acetaminophen **OR** acetaminophen, HYDROmorphone (DILAUDID) injection, ondansetron **OR** ondansetron (ZOFRAN) IV, oxyCODONE-acetaminophen  Physical Exam         Awake alert Sitting up in bed Answers questions appropriately Regular breathing S1 S2 Abdomen soft Has a strong odor from her wound No edema Dry skin Thin muscle wasting  Vital Signs: BP 108/68 (BP Location: Right Arm)   Pulse 93   Temp 98.2 F (36.8 C) (Oral)   Resp 18   Ht 5\' 2"  (1.575 m)   Wt 53.4 kg (117 lb 11.6 oz)   SpO2 100%   BMI 21.53 kg/m  SpO2: SpO2: 100 % O2 Device: O2 Device: Not Delivered O2 Flow Rate:    Intake/output summary:   Intake/Output Summary (Last 24 hours) at 05/05/2017 1203 Last data filed at 05/05/2017 0300 Gross per 24 hour  Intake 480 ml  Output -  Net 480 ml    LBM: Last BM Date: 05/02/17 Baseline Weight: Weight: 54 kg (119 lb) Most recent weight: Weight: 53.4 kg (117 lb 11.6 oz)       Palliative Assessment/Data:    Flowsheet Rows     Most Recent Value  Intake Tab  Referral Department  Hospitalist  Unit at Time of Referral  Med/Surg Unit  Palliative Care Primary Diagnosis  Cancer  Date Notified  04/30/17  Palliative Care Type  Return patient Palliative Care  Reason for referral  Clarify Goals of Care  Date of Admission  04/29/17  Date first seen by Palliative Care  05/04/17  # of days Palliative referral response time  4 Day(s)  # of days IP prior to Palliative referral  1  Clinical Assessment  Psychosocial & Spiritual Assessment  Palliative Care Outcomes      Patient Active Problem List   Diagnosis Date Noted  . Breast mass   . Acute blood loss anemia   . Hemorrhage from wound   . Ascites   . Evaluation by psychiatric service required 04/22/2017  . AKI (acute kidney injury) ([place]) 03/26/2017  . Leg swelling 03/26/2017  . Diarrhea 03/26/2017  . Leukocytosis 03/26/2017  . Breast wound 03/18/2017  . Blood loss anemia 01/18/2017  . Port catheter in place 12/02/2016  . Necrotizing soft tissue infection 11/21/2016  . Wound infection 10/28/2016  . Cellulitis of left breast 10/28/2016  . Encounter for palliative care   . Goals of care, counseling/discussion   . Metastatic breast cancer ([place]) 09/25/2016  . Malnutrition of moderate degree 09/25/2016  . Breast cancer of upper-outer quadrant of left female breast ([place]) 10/22/2015  . THYROID NODULE, RIGHT 05/31/2007  . Depressed 05/31/2007  . GERD 05/31/2007  . HOT FLASHES 05/31/2007  . HEADACHE 05/31/2007    Palliative Care Assessment & Plan   Patient Profile:    Assessment:  metastatic breast cancer Invasive adenocarcinoma Acute blood loss anemia from bleeding breast wound Cancer related pain  Goals of care discussions.   Recommendations/Plan:   remains full  code, re discussed today, wants her DNR bracelet removed, prolonged discussions with her about CPR, resuscitation and how it might harm/hurt her more than help her.   Augment pain regimen, add low dose scheduled IV Dilaudid, continue current PO PRN, increase scheduled long acting pain medication Oxy Contin PO.   Appreciate rad onc help.   Disposition remains a challenging issue, for now, to participate in radiation treatments/ continue with medication titration for symptom management.    Goals of Care and Additional Recommendations:  Limitations on Scope of Treatment: Full Scope Treatment  Code Status:    Code Status Orders  (From admission, onward)        Start     Ordered   05/04/17 1058  Full code  Continuous     05/04/17 1058    Code Status History    Date Active Date Inactive Code Status Order ID Comments User Context   05/01/2017 16:51 05/04/2017 10:58 DNR [id number]  Orson Eva, MD Inpatient   04/29/2017 18:19 05/01/2017 16:51 Full Code [id number]  Karmen Bongo, MD Inpatient   04/21/2017 14:11 04/28/2017 17:19 DNR [id number]  Loistine Chance, MD Inpatient   04/16/2017 22:14 04/21/2017 14:11 Full Code [id number]  Oswald Hillock, MD Inpatient   03/26/2017 19:45 03/30/2017 21:29 Full Code [id number]  Ivor Costa, MD ED   03/18/2017 08:41 03/24/2017 21:47 Full Code [id number]  Elwin Mocha, MD ED   01/18/2017 08:19 01/24/2017 17:19 Full Code [id number]  Shon Millet, DO ED   11/21/2016 18:17 11/22/2016 17:16 Full Code [id number]  Benito Mccreedy, MD Inpatient   10/28/2016 23:25 11/02/2016 21:08 Full Code [id number]  Rise Patience, MD Inpatient   09/25/2016 03:44 09/27/2016 20:08 Full Code [id number]  Etta Quill, DO ED      PPS 40%  Prognosis:   < 6 months  Discharge  Planning:  To Be Determined  Care plan was discussed with  Patient, RN.   Thank you for allowing the Palliative Medicine Team to assist in the care of this patient.   Time In: 11 Time Out: 11.35  Total Time 35 Prolonged Time Billed  no       Greater than 50%  of this time was spent counseling and coordinating care related to the above assessment and plan.  Loistine Chance, MD [phone]  Please contact Palliative Medicine Team phone at [phone] for questions and concerns.

## 2017-05-06 ENCOUNTER — Ambulatory Visit
Admit: 2017-05-06 | Discharge: 2017-05-06 | Disposition: A | Discharge status: Home / Self Care | Payer: Medicaid Other | Attending: Radiation Oncology | Admitting: Radiation Oncology

## 2017-05-06 LAB — BASIC METABOLIC PANEL
Anion gap: 5 (ref 5–15)
BUN: 17 mg/dL (ref 6–20)
CALCIUM: 8.3 mg/dL — AB (ref 8.9–10.3)
CO2: 27 mmol/L (ref 22–32)
CREATININE: 0.76 mg/dL (ref 0.44–1.00)
Chloride: 105 mmol/L (ref 101–111)
GFR calc non Af Amer: >60 mL/min (ref >60)
Glucose, Bld: 98 mg/dL (ref 65–99)
Potassium: 4.3 mmol/L (ref 3.5–5.1)
SODIUM: 137 mmol/L (ref 135–145)

## 2017-05-06 LAB — CBC
HEMATOCRIT: 23.8 % — AB (ref 36.0–46.0)
Hemoglobin: 7.7 g/dL — ABNORMAL LOW (ref 12.0–15.0)
MCH: 30.1 pg (ref 26.0–34.0)
MCHC: 32.4 g/dL (ref 30.0–36.0)
MCV: 93 fL (ref 78.0–100.0)
Platelets: 453 10*3/uL — ABNORMAL HIGH (ref 150–400)
RBC: 2.56 MIL/uL — ABNORMAL LOW (ref 3.87–5.11)
RDW: 16.7 % — AB (ref 11.5–15.5)
WBC: 15.3 10*3/uL — ABNORMAL HIGH (ref 4.0–10.5)

## 2017-05-06 NOTE — Progress Notes (Signed)
Nutrition Follow-up  DOCUMENTATION CODES:   Severe malnutrition in context of chronic illness  INTERVENTION:   -Continue Ensure Enlive po TID, each supplement provides 350 kcal and 20 grams of protein -Continue Multivitamin with minerals daily -RD will continue to monitor for plan and GOC  Recommend daily weights as pt is full code and with catabolic illness.  NUTRITION DIAGNOSIS:   Severe Malnutrition related to catabolic illness, cancer and cancer related treatments, chronic illness as evidenced by energy intake < or equal to 75% for > or equal to 1 month, moderate fat depletion, severe muscle depletion.  Malnutrition worsened to severe status.   GOAL:   Patient will meet greater than or equal to 90% of their needs  Pt eating meals but not meeting needs as they are increased.  MONITOR:   PO intake, Supplement acceptance, Weight trends, Skin, Labs  ASSESSMENT:   61 y.o. female seen at the request of Dr. Carles Collet.  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 She initially refused treatment, opting for homeopathic treatments instead. However, she did return to medical oncology in June 2018 and agreed to begin palliative chemotherapy. Her last dose of chemotherapy was delivered on 03/15/2017, she completed 5 of 6 scheduled cycles.   Patient continues to consume 50-100% of meals. On 12/12, she ate 100% of lunch which provided ~900 kcal and 22g of protein. Dinner was ordered but no intakes recorded.  On 12/11 patient consumed 50% of lunch which provided 400 kcal and 19g of protein and 100% of breakfast which provided ~800 kcal and 30g of protein.  Even with good intakes, pt is still not meeting increased needs d/t catabolic illness. Ensure supplements were increased to TID.   Palliative following, MD recommended comfort care. Pt refusing at this time.   Labs reviewed. Medications: Colace capsule BID, Remeron tablet daily, Multivitamin with minerals daily, Miralax packet daily  NUTRITION - FOCUSED PHYSICAL EXAM:    Most Recent Value  Orbital Region  Mild depletion  Upper Arm Region  Moderate depletion  Thoracic and Lumbar Region  Moderate depletion  Buccal Region  Mild depletion  Temple Region  Mild depletion  Clavicle Bone Region  Severe depletion  Clavicle and Acromion Bone Region  Moderate depletion  Scapular Bone Region  Severe depletion  Dorsal Hand  Moderate depletion  Patellar Region  Mild depletion  Posterior Calf Region  Mild depletion  Edema (RD Assessment)  None  Hair  Reviewed  Eyes  Reviewed  Mouth  Reviewed  Skin  Reviewed  Nails  Reviewed       Diet Order:  Diet regular Room service appropriate? Yes; Fluid consistency: Thin  EDUCATION NEEDS:   Education needs have been addressed  Skin:  Skin Assessment: Skin Integrity Issues: Other: open wound on left breast (non-pressure)  Last BM:  12/9  Height:   Ht Readings from Last 1 Encounters:  05/03/17 5\' 2"  (1.575 m)    Weight:   Wt Readings from Last 1 Encounters:  05/03/17 117 lb 11.6 oz (53.4 kg)    Ideal Body Weight:  50 kg  BMI:  Body mass index is 21.53 kg/m.  Estimated Nutritional Needs:   Kcal:  1850-2050  Protein:  85-105g  Fluid:  2L/day  Clayton Bibles, MS, RD, LDN [facility] Inpatient Clinical Dietitian Pager:  [phone] After Hours Pager: [phone]

## 2017-05-06 NOTE — Progress Notes (Signed)
[place] [facility] Therapy Treatment Record Phone [phone]   Radiation Therapy was administered to Ann Oliver on: 05/06/2017  10:34 AM and was treatment # 3 out of a planned course of 4 treatments.  Radiation Treatment  1). Beam photons with 6-10 energy  2). Brachytherapy None  3). Stereotactic Radiosurgery None  4). Other Radiation None     Bennett Scrape, RT (T)

## 2017-05-06 NOTE — Progress Notes (Signed)
Patient called out & asked about doing something with her dressing due to drainage coming through ACE bandage. Per MD notes, no dressing changes due to causing more bleeding. Placed ABD pads where the drainage had leaked through ACE wrap and wrapped a new ACE wrap around ABD pads. Placed new gown on patient. Gave patient 2 percocets d/t c/o pain 9/10. Continue to monitor.

## 2017-05-06 NOTE — Progress Notes (Signed)
Patient ID: Ann Oliver, female   DOB: 1956/03/26, 61 y.o.   MRN: [id number]  PROGRESS NOTE    Alexiss Iturralde  IOX:[id number] DOB: January 30, 1956 DOA: 04/29/2017  PCP: Center, [facility]   Brief Narrative:  61 year old female with a history of metastatic breast cancer, malnutrition, and blood loss anemia [facility] from her left fungating breast mass. The patient has had numerous hospital admissions in the last 6 months secondary to bleeding from her left-sided breast mass. The patient was sent to the emergency department because of concerns for bleeding from her wound site. Most recently, the patient was admitted to [facility] to bleeding from her breast mass. The patient was seen by general surgery, the wound care team, palliative medicine, psychiatry, and her medical oncologist [name]).The patient was deemed to have capacity to make medical decisions. She wished to continue the full scope of care. She required complex wound care by the wound care team and the general surgical team. Most recently, the patient was transfused 2 units PRBC on April 27, 2017. After stabilization, the patient was ultimately discharged to stabilization [facility].The patient is a poor historian and very tangential, but it is been noted that the patient is essentially homeless. Recent CT of the abdomen and pelvis on April 20, 2017 largely showed progression of her metastatic disease with enlargement of her chest wall mass and increase in left subpectoral lymph nodes. There was also progression of her pulmonary metastasis. She was noted to have hemoglobin 6.7,but she remained hemodynamically stable. The patient was transfused a total of2units PRBC during this hospital admission. The patient was seen by general surgery whom changed the patient's dressing. General surgery did not feel patient was a candidate for operative intervention at this time.  Dr.  Tammi Klippel, agreed to see the patient in consultation for palliative radiation to control bleeding.  Assessment & Plan:   Metastatic breast cancer--invasive adenocarcinoma - Follow up with [name] - Last chemo 03/15/2017 - Appreciate rad onc seeing the pt in consultation for possible radiation and control of bleeding  - April 20, 2017- CTabd/pelvisshows progression of metastatic disease - Overall poor prognosis--Dr. Gudenarecommends hospice with comfort measures,but patient refuses - Pt previously refused mastectomy but now no longer a candidate for surgery  - Palliative care following for goals of care  Acute blood loss anemia/Bleeding breast mass - Secondary to fungating left breast mass - Baseline hemoglobin7-8 - Transfused 2 U PRBC 12/4, 1 U PRBC 12/6 (previous admission) - Transfused 2 U PRBC during this admission - Transfuse for hgb less than 7 - Hgb this am 7.7 - Per surgery no need for frequent dressing changes as it will cause more bleeding (change once per week) - Plan for radiation therapy to left breast for hemostasis   Moderate protein calorie malnutrition - In the context of chronic illness - Encouraged diet  Cancer related pain - Continue pain management   Leukocytosis - Remains off of abx and monitored clinically - Improved     DVT prophylaxis: SCD's Code Status: full code  Family Communication: updated pt at the bedside  Disposition Plan: plan for radiation today   Consultants:   Palliative care  Radiation oncology   Procedures:   None  Antimicrobials:   None   Subjective: No overnight events.  Objective: Vitals:   05/05/17 0446 05/05/17 1430 05/05/17 2021 05/06/17 0431  BP: 108/68 112/68 128/77 112/65  Pulse: 93 100 92 93  Resp: 18 18 16 18   Temp: 98.2 F (36.8  C) 99.3 F (37.4 C) 99.8 F (37.7 C) 99.7 F (37.6 C)  TempSrc: Oral Oral Oral Oral  SpO2: 100% 100% 100% 100%  Weight:      Height:        Intake/Output  Summary (Last 24 hours) at 05/06/2017 1228 Last data filed at 05/06/2017 1109 Gross per 24 hour  Intake 360 ml  Output -  Net 360 ml   Filed Weights   04/29/17 1411 05/03/17 1930  Weight: 54 kg (119 lb) 53.4 kg (117 lb 11.6 oz)    Physical Exam  Constitutional: Appears ill, no distress  CVS: RRR, S1/S2 +  Pulmonary: Effort and breath sounds normal, no stridor, rhonchi, wheezes, rales.  Abdominal: Soft. BS +,  no distension, tenderness, rebound or guarding.  Musculoskeletal: No edema and no tenderness.  Lymphadenopathy: No lymphadenopathy noted, cervical, inguinal. Neuro: Alert. Normal reflexes, muscle tone coordination. No cranial nerve deficit. Skin: left breast wounds, fungating mass with foul smell  Psychiatric: Normal mood and affect. Behavior, judgment, thought content normal.    Data Reviewed: I have personally reviewed following labs and imaging studies  CBC: Recent Labs  Lab 04/29/17 1542 04/30/17 0634 05/01/17 0530 05/02/17 0518 05/03/17 0551 05/06/17 0346  WBC 16.5* 16.6* 16.5* 16.3* 17.6* 15.3*  NEUTROABS 13.6*  --   --   --   --   --   HGB 6.7* 7.1* 8.4* 8.5* 8.3* 7.7*  HCT 21.4* 21.7* 25.4* 26.9* 26.2* 23.8*  MCV 89.5 89.7 92.4 95.1 95.3 93.0  PLT 603* 455* 451* 496* 506* 324*   Basic Metabolic Panel: Recent Labs  Lab 04/29/17 1542 04/30/17 0634 05/01/17 0530 05/05/17 1140 05/06/17 0346  NA 136 134* 137 136 137  K 5.0 4.5 4.2 4.7 4.3  CL 102 101 106 100* 105  CO2 26 27 26 27 27   GLUCOSE 136* 98 89 100* 98  BUN 19 18 18 15 17   CREATININE 0.92 0.89 0.87 0.76 0.76  CALCIUM 8.1* 8.0* 7.9* 9.0 8.3*  MG  --   --  1.9  --   --    GFR: Estimated Creatinine Clearance: 57.7 mL/min (by C-G formula based on SCr of 0.76 mg/dL). Liver Function Tests: No results for input(s): AST, ALT, ALKPHOS, BILITOT, PROT, ALBUMIN in the last 168 hours. No results for input(s): LIPASE, AMYLASE in the last 168 hours. No results for input(s): AMMONIA in the last 168  hours. Coagulation Profile: No results for input(s): INR, PROTIME in the last 168 hours. Cardiac Enzymes: No results for input(s): CKTOTAL, CKMB, CKMBINDEX, TROPONINI in the last 168 hours. BNP (last 3 results) No results for input(s): PROBNP in the last 8760 hours. HbA1C: No results for input(s): HGBA1C in the last 72 hours. CBG: No results for input(s): GLUCAP in the last 168 hours. Lipid Profile: No results for input(s): CHOL, HDL, LDLCALC, TRIG, CHOLHDL, LDLDIRECT in the last 72 hours. Thyroid Function Tests: No results for input(s): TSH, T4TOTAL, FREET4, T3FREE, THYROIDAB in the last 72 hours. Anemia Panel: No results for input(s): VITAMINB12, FOLATE, FERRITIN, TIBC, IRON, RETICCTPCT in the last 72 hours. Urine analysis:    Component Value Date/Time   COLORURINE STRAW (A) 05/02/2017 2100   APPEARANCEUR CLEAR 05/02/2017 2100   LABSPEC 1.010 05/02/2017 2100   PHURINE 7.0 05/02/2017 2100   GLUCOSEU NEGATIVE 05/02/2017 2100   HGBUR NEGATIVE 05/02/2017 2100   BILIRUBINUR NEGATIVE 05/02/2017 2100   [place] NEGATIVE 05/02/2017 2100   PROTEINUR NEGATIVE 05/02/2017 2100   NITRITE NEGATIVE 05/02/2017 2100  LEUKOCYTESUR NEGATIVE 05/02/2017 2100   Sepsis Labs: @LABRCNTIP (procalcitonin:4,lacticidven:4)    MRSA PCR Screening     Status: None   Collection Time: 04/30/17 12:28 AM  Result Value Ref Range Status   MRSA by PCR NEGATIVE NEGATIVE Final      Radiology Studies: No results found.   Scheduled Meds: . docusate sodium  100 mg Oral BID  . feeding supplement (ENSURE ENLIVE)  237 mL Oral TID BM  . gabapentin  600 mg Oral TID  .  HYDROmorphone (DILAUDID) injection  1 mg Intravenous Q6H  . mirtazapine  15 mg Oral QHS  . multivitamin with minerals  1 tablet Oral Daily  . oxyCODONE  15 mg Oral Q12H  . polyethylene glycol  17 g Oral Daily   Continuous Infusions:   LOS: 7 days    Time spent: 25 minutes  Greater than 50% of the time spent on counseling and  coordinating the care.   Leisa Lenz, MD [facility] Pager [phone]  If 7PM-7AM, please contact night-coverage [email or web address] Password [facility] 05/06/2017, 12:28 PM

## 2017-05-07 ENCOUNTER — Encounter (HOSPITAL_BASED_OUTPATIENT_CLINIC_OR_DEPARTMENT_OTHER): Payer: Medicaid Other | Attending: Internal Medicine

## 2017-05-07 ENCOUNTER — Encounter: Payer: Self-pay | Admitting: Radiation Oncology

## 2017-05-07 DIAGNOSIS — D0502 Lobular carcinoma in situ of left breast: Secondary | ICD-10-CM | POA: Insufficient documentation

## 2017-05-07 DIAGNOSIS — Z9221 Personal history of antineoplastic chemotherapy: Secondary | ICD-10-CM | POA: Insufficient documentation

## 2017-05-07 DIAGNOSIS — C7800 Secondary malignant neoplasm of unspecified lung: Secondary | ICD-10-CM

## 2017-05-07 DIAGNOSIS — C50911 Malignant neoplasm of unspecified site of right female breast: Secondary | ICD-10-CM

## 2017-05-07 LAB — CBC
HEMATOCRIT: 23.8 % — AB (ref 36.0–46.0)
HEMOGLOBIN: 7.8 g/dL — AB (ref 12.0–15.0)
MCH: 30.4 pg (ref 26.0–34.0)
MCHC: 32.8 g/dL (ref 30.0–36.0)
MCV: 92.6 fL (ref 78.0–100.0)
Platelets: 493 10*3/uL — ABNORMAL HIGH (ref 150–400)
RBC: 2.57 MIL/uL — ABNORMAL LOW (ref 3.87–5.11)
RDW: 17.1 % — AB (ref 11.5–15.5)
WBC: 15 10*3/uL — ABNORMAL HIGH (ref 4.0–10.5)

## 2017-05-07 LAB — BASIC METABOLIC PANEL
Anion gap: 5 (ref 5–15)
BUN: 18 mg/dL (ref 6–20)
CALCIUM: 8.6 mg/dL — AB (ref 8.9–10.3)
CHLORIDE: 105 mmol/L (ref 101–111)
CO2: 28 mmol/L (ref 22–32)
CREATININE: 0.81 mg/dL (ref 0.44–1.00)
GFR calc non Af Amer: >60 mL/min (ref >60)
GLUCOSE: 96 mg/dL (ref 65–99)
Potassium: 4.1 mmol/L (ref 3.5–5.1)
Sodium: 138 mmol/L (ref 135–145)

## 2017-05-07 MED ORDER — OXYCODONE-ACETAMINOPHEN 5-325 MG PO TABS: 1.0000 | ORAL_TABLET | Freq: Four times a day (QID) | ORAL | 0 refills | Status: DC | PRN

## 2017-05-07 MED ORDER — GABAPENTIN 600 MG PO TABS: 600.0000 mg | ORAL_TABLET | Freq: Three times a day (TID) | ORAL | 1 refills | Status: DC

## 2017-05-07 MED ORDER — POLYETHYLENE GLYCOL 3350 17 G PO PACK: 17.0000 g | PACK | Freq: Every day | ORAL | 0 refills | Status: DC

## 2017-05-07 MED ORDER — ONDANSETRON HCL 8 MG PO TABS: 8.0000 mg | ORAL_TABLET | Freq: Three times a day (TID) | ORAL | 0 refills | Status: DC | PRN

## 2017-05-07 MED ORDER — OXYCODONE HCL ER 20 MG PO T12A: 20.0000 mg | EXTENDED_RELEASE_TABLET | Freq: Three times a day (TID) | ORAL | Status: DC

## 2017-05-07 MED ORDER — MIRTAZAPINE 15 MG PO TABS: 15.0000 mg | ORAL_TABLET | Freq: Every day | ORAL | 0 refills | Status: DC

## 2017-05-07 MED ORDER — ADULT MULTIVITAMIN W/MINERALS CH: 1.0000 | ORAL_TABLET | Freq: Every day | ORAL | 0 refills | Status: DC

## 2017-05-07 NOTE — Progress Notes (Signed)
Daily Progress Note   Patient Name: Ann Oliver       Date: 05/07/2017 DOB: 1956/05/14  Age: 61 y.o. MRN#: [id number] Attending Physician: Bonnielee Haff, MD Primary Care Physician: Center, [facility] Admit Date: 04/29/2017  Reason for Consultation/Follow-up: Establishing goals of care  Subjective:  patient is sitting up in bed, she states she is going home today  Is tolerating POs  Discussed with CSW, appreciate their input and recommendations, patient's goals are not hospice oriented at this point, see below  Length of Stay: 8  Current Medications: Scheduled Meds:  . docusate sodium  100 mg Oral BID  . feeding supplement (ENSURE ENLIVE)  237 mL Oral TID BM  . gabapentin  600 mg Oral TID  . mirtazapine  15 mg Oral QHS  . multivitamin with minerals  1 tablet Oral Daily  . oxyCODONE  20 mg Oral Q8H  . polyethylene glycol  17 g Oral Daily    Continuous Infusions:   PRN Meds: acetaminophen **OR** acetaminophen, HYDROmorphone (DILAUDID) injection, ondansetron **OR** ondansetron (ZOFRAN) IV, oxyCODONE-acetaminophen  Physical Exam         Awake alert Sitting up in bed Answers questions appropriately Regular breathing S1 S2 Abdomen soft Has a strong odor from her wound No edema Dry skin Thin muscle wasting  Vital Signs: BP 91/65 (BP Location: Right Arm)   Pulse 89   Temp 99 F (37.2 C) (Oral)   Resp 16   Ht 5\' 2"  (1.575 m)   Wt 54.7 kg (120 lb 9.5 oz)   SpO2 100%   BMI 22.06 kg/m  SpO2: SpO2: 100 % O2 Device: O2 Device: Not Delivered O2 Flow Rate:    Intake/output summary:   Intake/Output Summary (Last 24 hours) at 05/07/2017 1254 Last data filed at 05/06/2017 1556 Gross per 24 hour  Intake 120 ml  Output -  Net 120 ml   LBM: Last BM  Date: 05/06/17 Baseline Weight: Weight: 54 kg (119 lb) Most recent weight: Weight: 54.7 kg (120 lb 9.5 oz)       Palliative Assessment/Data:    Flowsheet Rows     Most Recent Value  Intake Tab  Referral Department  Hospitalist  Unit at Time of Referral  Med/Surg Unit  Palliative Care Primary Diagnosis  Cancer  Date Notified  04/30/17  Palliative  Care Type  Return patient Palliative Care  Reason for referral  Clarify Goals of Care  Date of Admission  04/29/17  Date first seen by Palliative Care  05/04/17  # of days Palliative referral response time  4 Day(s)  # of days IP prior to Palliative referral  1  Clinical Assessment  Psychosocial & Spiritual Assessment  Palliative Care Outcomes      Patient Active Problem List   Diagnosis Date Noted  . Breast mass   . Acute blood loss anemia   . Hemorrhage from wound   . Ascites   . Evaluation by psychiatric service required 04/22/2017  . AKI (acute kidney injury) ([place]) 03/26/2017  . Leg swelling 03/26/2017  . Diarrhea 03/26/2017  . Leukocytosis 03/26/2017  . Breast wound 03/18/2017  . Blood loss anemia 01/18/2017  . Port catheter in place 12/02/2016  . Necrotizing soft tissue infection 11/21/2016  . Wound infection 10/28/2016  . Cellulitis of left breast 10/28/2016  . Encounter for palliative care   . Goals of care, counseling/discussion   . Metastatic breast cancer ([place]) 09/25/2016  . Malnutrition of moderate degree 09/25/2016  . Breast cancer of upper-outer quadrant of left female breast ([place]) 10/22/2015  . THYROID NODULE, RIGHT 05/31/2007  . Depressed 05/31/2007  . GERD 05/31/2007  . HOT FLASHES 05/31/2007  . HEADACHE 05/31/2007    Palliative Care Assessment & Plan   Patient Profile:    Assessment:  metastatic breast cancer Invasive adenocarcinoma Acute blood loss anemia from bleeding breast wound Cancer related pain  Goals of care discussions.   Recommendations/Plan:   remains full code,    Augment  pain regimen, continue current PO PRN, increase scheduled long acting pain medication Oxy Contin PO to 20 mg PO Q 8 hours scheduled.   Appreciate rad onc help.   Patient wishes to go home, addressing disposition and goals of care has remained a challenge, patient states she doesn't want SNF.    Goals of Care and Additional Recommendations:  Limitations on Scope of Treatment: Full Scope Treatment  Code Status:    Code Status Orders  (From admission, onward)        Start     Ordered   05/04/17 1058  Full code  Continuous     05/04/17 1058    Code Status History    Date Active Date Inactive Code Status Order ID Comments User Context   05/01/2017 16:51 05/04/2017 10:58 DNR [id number]  Orson Eva, MD Inpatient   04/29/2017 18:19 05/01/2017 16:51 Full Code [id number]  Karmen Bongo, MD Inpatient   04/21/2017 14:11 04/28/2017 17:19 DNR [id number]  Loistine Chance, MD Inpatient   04/16/2017 22:14 04/21/2017 14:11 Full Code [id number]  Oswald Hillock, MD Inpatient   03/26/2017 19:45 03/30/2017 21:29 Full Code [id number]  Ivor Costa, MD ED   03/18/2017 08:41 03/24/2017 21:47 Full Code [id number]  Elwin Mocha, MD ED   01/18/2017 08:19 01/24/2017 17:19 Full Code [id number]  Shon Millet, DO ED   11/21/2016 18:17 11/22/2016 17:16 Full Code [id number]  Benito Mccreedy, MD Inpatient   10/28/2016 23:25 11/02/2016 21:08 Full Code [id number]  Rise Patience, MD Inpatient   09/25/2016 03:44 09/27/2016 20:08 Full Code [id number]  Etta Quill, DO ED      PPS 40%  Prognosis:   < 6 months  Discharge Planning:  To Be Determined  Care plan was discussed with  Patient, RN.   Thank you for allowing the Palliative Medicine Team to assist in the  care of this patient.   Time In: 12 Time Out: 12.25 Total Time 25 Prolonged Time Billed  no       Greater than 50%  of this time was spent counseling and coordinating care related to the above assessment and plan.  Loistine Chance, MD [phone]    Please contact Palliative Medicine Team phone at [phone] for questions and concerns.

## 2017-05-07 NOTE — Discharge Summary (Signed)
[facility]  Physician Discharge Summary   Patient ID: Ann Oliver MRN: [id number] DOB/AGE: [name] 62 61 y.o.  Admit date: 04/29/2017 Discharge date: 05/07/2017  PCP: [facility], [facility]  DISCHARGE DIAGNOSES:  Principal Problem:   Breast cancer of upper-outer quadrant of left female breast ([place]) Active Problems:   Breast cancer metastasized to lung, right (H[facility])   Malnutrition of moderate degree   Blood loss anemia   Acute blood loss anemia   Hemorrhage from wound   Breast mass   RECOMMENDATIONS FOR OUTPATIENT FOLLOW UP: 1. Outpatient follow-up with the wound center as well as radiation oncology   DISCHARGE CONDITION: fair  Diet recommendation: Regular as tolerated  Filed Weights   04/29/17 1411 05/03/17 1930 05/06/17 1300  Weight: 54 kg (119 lb) 53.4 kg (117 lb 11.6 oz) 54.7 kg (120 lb 9.5 oz)    INITIAL HISTORY: 61 year old female with a history of metastatic breast cancer, malnutrition, and blood loss anemia [facility] from her left fungating breast mass. The patient has had numerous hospital admissions in the last 6 months secondary to bleeding from her left-sided breast mass. The patient was sent to the emergency department because of concerns for bleeding from her wound site. Most recently, the patient was admitted to [facility] to bleeding from her breast mass. The patient was seen by general surgery, the wound care team, palliative medicine, psychiatry, and her medical oncologist Ann Oliver).The patient was deemed to have capacity to make medical decisions. She wished to continue the full scope of care. She required complex wound care by the wound care team and the general surgical team. Most recently, the patient was transfused 2 units PRBC on April 27, 2017. After stabilization, the patient was ultimately discharged to stabilization [facility].The patient is a poor historian and very tangential, but  it is been noted that the patient is essentially homeless. Recent CT of the abdomen and pelvis on April 20, 2017 largely showed progression of her metastatic disease with enlargement of her chest wall mass and increase in left subpectoral lymph nodes. There was also progression of her pulmonary metastasis. She was noted to have hemoglobin 6.7,but she remained hemodynamically stable. The patient was transfused a total of2units PRBC during this hospital admission. The patient was seen by general surgery whom changed the patient's dressing. General surgery did not feel patient was a candidate for operative intervention at this time.    Seen by radiation oncology.    Consultants:   Palliative care  Radiation oncology     HOSPITAL COURSE:   Metastatic breast cancer--invasive adenocarcinoma Followed by Dr. Lindi Oliver. Last chemo 03/15/2017.  Thought to have very poor prognosis.  Her oncologist has recommended hospice with comfort measures but the patient refuses.  Seen by radiation oncology during this hospitalization and she underwent radiation treatment.  Completed her treatment on 12/13.  They will follow the patient in their office as an outpatient.  Patient also seen by palliative medicine.  She has declined hospice.  She wants to be full code.    Acute blood loss anemia/Bleeding breast mass Secondary to fungating left breast mass. Baseline hemoglobin7-8. Transfused 2 U PRBC 12/4, 1 U PRBC 12/6 (previous admission). Transfused 2 U PRBC during this admission.  Globin has been stable.  Surgery has recommended infrequent dressing changes.  This is to minimize bleeding from her breast lesions.  She has an appointment with the wound center this afternoon.    Moderate protein calorie malnutrition In the context of chronic illness. Encouraged  diet  Cancer related pain Patient has been on long-acting OxyContin as well as oxycodone for breakthrough pain.  However per nursing notes she has  not been taking the long-acting OxyContin.  [facility] prescriber database was reviewed.  Last prescription for oxycodone was on October 31.  She will be discharged with prescription for same.    Patient remains stable.  She is completed her radiation treatment.  Okay for discharge home today.    PERTINENT LABS:  The results of significant diagnostics from this hospitalization (including imaging, microbiology, ancillary and laboratory) are listed below for reference.    Microbiology: Recent Results (from the past 240 hour(s))  MRSA PCR Screening     Status: None   Collection Time: 04/30/17 12:28 AM  Result Value Ref Range Status   MRSA by PCR NEGATIVE NEGATIVE Final    Comment:        The GeneXpert MRSA Assay (FDA approved for NASAL specimens only), is one component of a comprehensive MRSA colonization surveillance program. It is not intended to diagnose MRSA infection nor to guide or monitor treatment for MRSA infections.   Culture, Urine     Status: Abnormal   Collection Time: 05/02/17  9:00 PM  Result Value Ref Range Status   Specimen Description URINE, CLEAN CATCH  Final   Special Requests NONE  Final   Culture MULTIPLE SPECIES PRESENT, SUGGEST RECOLLECTION (A)  Final   Report Status 05/05/2017 FINAL  Final     Labs: Basic Metabolic Panel: Recent Labs  Lab 05/01/17 0530 05/05/17 1140 05/06/17 0346 05/07/17 0351  NA 137 136 137 138  K 4.2 4.7 4.3 4.1  CL 106 100* 105 105  CO2 26 27 27 28   GLUCOSE 89 100* 98 96  BUN 18 15 17 18   CREATININE 0.87 0.76 0.76 0.81  CALCIUM 7.9* 9.0 8.3* 8.6*  MG 1.9  --   --   --    CBC: Recent Labs  Lab 05/01/17 0530 05/02/17 0518 05/03/17 0551 05/06/17 0346 05/07/17 0351  WBC 16.5* 16.3* 17.6* 15.3* 15.0*  HGB 8.4* 8.5* 8.3* 7.7* 7.8*  HCT 25.4* 26.9* 26.2* 23.8* 23.8*  MCV 92.4 95.1 95.3 93.0 92.6  PLT 451* 496* 506* 453* 493*     DISCHARGE EXAMINATION: Vitals:   05/06/17 1300 05/06/17 1555 05/06/17 2037  05/07/17 0448  BP:  (!) 113/100 120/61 91/65  Pulse:  (!) 109 95 89  Resp:  20 18 16   Temp:  98.7 F (37.1 C) 99.2 F (37.3 C) 99 F (37.2 C)  TempSrc:  Oral Oral Oral  SpO2:  100% 100% 100%  Weight: 54.7 kg (120 lb 9.5 oz)     Height:       General appearance: alert, cooperative and no distress Resp: clear to auscultation bilaterally Chest wall: Covered with dressing. Cardio: regular rate and rhythm, S1, S2 normal, no murmur, click, rub or gallop GI: soft, non-tender; bowel sounds normal; no masses,  no organomegaly  DISPOSITION: Home  Discharge Instructions    Call MD for:  extreme fatigue   Complete by:  As directed    Call MD for:  persistant dizziness or light-headedness   Complete by:  As directed    Call MD for:  persistant nausea and vomiting   Complete by:  As directed    Call MD for:  severe uncontrolled pain   Complete by:  As directed    Call MD for:  temperature >100.4   Complete by:  As directed  Diet general   Complete by:  As directed    Discharge instructions   Complete by:  As directed    Please keep your appointment at the [facility] today (12/14) at [place] were cared for by a hospitalist during your hospital stay. If you have any questions about your discharge medications or the care you received while you were in the hospital after you are discharged, you can call the unit and asked to speak with the hospitalist on call if the hospitalist that took care of you is not available. Once you are discharged, your primary care physician will handle any further medical issues. Please note that NO REFILLS for any discharge medications will be authorized once you are discharged, as it is imperative that you return to your primary care physician (or establish a relationship with a primary care physician if you do not have one) for your aftercare needs so that they can reassess your need for medications and monitor your lab values. If you do not have a primary  care physician, you can call [phone] for a physician referral.   Increase activity slowly   Complete by:  As directed        Allergies as of 05/07/2017      Reactions   Aspirin Palpitations      Medication List    TAKE these medications   ANUCORT-HC 25 MG suppository Generic drug:  hydrocortisone unwrap and insert 1 suppository rectally twice a day if needed for HEMORRHOIDS OR ANAL ITCHING   gabapentin 600 MG tablet Commonly known as:  NEURONTIN Take 1 tablet (600 mg total) by mouth 3 (three) times daily.   mirtazapine 15 MG tablet Commonly known as:  REMERON Take 1 tablet (15 mg total) by mouth at bedtime.   multivitamin with minerals Tabs tablet Take 1 tablet by mouth daily.   ondansetron 8 MG tablet Commonly known as:  ZOFRAN Take 1 tablet (8 mg total) by mouth every 8 (eight) hours as needed for nausea or vomiting. What changed:  See the new instructions.   oxyCODONE 10 mg 12 hr tablet Commonly known as:  OXYCONTIN Take 1 tablet (10 mg total) by mouth every 12 (twelve) hours.   oxyCODONE-acetaminophen 5-325 MG tablet Commonly known as:  PERCOCET/ROXICET Take 1-2 tablets by mouth every 6 (six) hours as needed for severe pain. What changed:  how much to take   polyethylene glycol packet Commonly known as:  MIRALAX / GLYCOLAX Take 17 g by mouth daily.        Follow-up Information    [facility]              Follow up on 05/07/2017.   Why:  2PM be there at 1:45.  Contact information: [address] [address] [phone]       Freeman Caldron, PA-C Follow up.   Specialty:  Urology Why:  on 06/09/17 at [facility] information: [place] [phone]           TOTAL DISCHARGE TIME: 35 mins  Bonnielee Haff  [facility] Pager [phone]  05/07/2017, 1:22 PM

## 2017-05-10 NOTE — Progress Notes (Signed)
  Radiation Oncology         [phone]) [phone] ________________________________  Name: Ann Oliver MRN: [id number]  Date: 05/07/2017  DOB: Dec 29, 1955  End of Treatment Note  Diagnosis:   61 y.o. 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  Beams/energy:   3D // 6X Photon  Narrative: The patient tolerated radiation treatment relatively well.   No acute side effects noted.  Plan: The patient has completed radiation treatment. The patient will return to radiation oncology clinic for routine followup in one month. I advised her to call or return sooner if she has any questions or concerns related to her recovery or treatment. ________________________________  Sheral Apley. Tammi Klippel, M.D.  This document serves as a record of services personally performed by Tyler Pita, MD. It was created on his behalf by Rae Lips, a trained medical scribe. The creation of this record is based on the scribe's personal observations and the provider's statements to them. This document has been checked and approved by the attending provider.

## 2017-05-12 ENCOUNTER — Encounter (HOSPITAL_COMMUNITY): Payer: Self-pay

## 2017-05-12 ENCOUNTER — Other Ambulatory Visit: Payer: Self-pay

## 2017-05-12 ENCOUNTER — Emergency Department (HOSPITAL_COMMUNITY): Payer: Medicaid Other

## 2017-05-12 ENCOUNTER — Emergency Department (HOSPITAL_COMMUNITY)
Admission: EM | Admit: 2017-05-12 | Discharge: 2017-05-12 | Disposition: A | Discharge status: Home / Self Care | Payer: Medicaid Other | Attending: Emergency Medicine | Admitting: Emergency Medicine

## 2017-05-12 DIAGNOSIS — C50812 Malignant neoplasm of overlapping sites of left female breast: Secondary | ICD-10-CM | POA: Diagnosis not present

## 2017-05-12 DIAGNOSIS — Z79899 Other long term (current) drug therapy: Secondary | ICD-10-CM | POA: Diagnosis not present

## 2017-05-12 LAB — COMPREHENSIVE METABOLIC PANEL
ALBUMIN: 2.4 g/dL — AB (ref 3.5–5.0)
ALT: 19 U/L (ref 14–54)
ANION GAP: 8 (ref 5–15)
AST: 27 U/L (ref 15–41)
Alkaline Phosphatase: 129 U/L — ABNORMAL HIGH (ref 38–126)
BILIRUBIN TOTAL: 0.3 mg/dL (ref 0.3–1.2)
BUN: 24 mg/dL — ABNORMAL HIGH (ref 6–20)
CO2: 27 mmol/L (ref 22–32)
Calcium: 9 mg/dL (ref 8.9–10.3)
Chloride: 102 mmol/L (ref 101–111)
Creatinine, Ser: 0.84 mg/dL (ref 0.44–1.00)
GFR calc Af Amer: >60 mL/min (ref >60)
GFR calc non Af Amer: >60 mL/min (ref >60)
GLUCOSE: 110 mg/dL — AB (ref 65–99)
POTASSIUM: 4.7 mmol/L (ref 3.5–5.1)
SODIUM: 137 mmol/L (ref 135–145)
TOTAL PROTEIN: 7 g/dL (ref 6.5–8.1)

## 2017-05-12 LAB — CBC WITH DIFFERENTIAL/PLATELET
BASOS PCT: 0 %
Basophils Absolute: 0 10*3/uL (ref 0.0–0.1)
EOS ABS: 0.1 10*3/uL (ref 0.0–0.7)
Eosinophils Relative: 0 %
HEMATOCRIT: 25 % — AB (ref 36.0–46.0)
Hemoglobin: 7.9 g/dL — ABNORMAL LOW (ref 12.0–15.0)
Lymphocytes Relative: 4 %
Lymphs Abs: 0.9 10*3/uL (ref 0.7–4.0)
MCH: 28.4 pg (ref 26.0–34.0)
MCHC: 31.6 g/dL (ref 30.0–36.0)
MCV: 89.9 fL (ref 78.0–100.0)
MONO ABS: 0.5 10*3/uL (ref 0.1–1.0)
MONOS PCT: 2 %
Neutro Abs: 21.4 10*3/uL — ABNORMAL HIGH (ref 1.7–7.7)
Neutrophils Relative %: 94 %
Platelets: 692 10*3/uL — ABNORMAL HIGH (ref 150–400)
RBC: 2.78 MIL/uL — ABNORMAL LOW (ref 3.87–5.11)
RDW: 17.5 % — AB (ref 11.5–15.5)
WBC: 22.8 10*3/uL — ABNORMAL HIGH (ref 4.0–10.5)

## 2017-05-12 LAB — I-STAT CG4 LACTIC ACID, ED: Lactic Acid, Venous: 0.88 mmol/L (ref 0.5–1.9)

## 2017-05-12 MED ORDER — OXYCODONE-ACETAMINOPHEN 5-325 MG PO TABS
1.0000 | ORAL_TABLET | Freq: Once | ORAL | Status: AC
  Administered 2017-05-12: 1 via ORAL
  Filled 2017-05-12: qty 1

## 2017-05-12 MED ORDER — GABAPENTIN 300 MG PO CAPS
600.0000 mg | ORAL_CAPSULE | Freq: Once | ORAL | Status: AC
  Administered 2017-05-12: 600 mg via ORAL
  Filled 2017-05-12: qty 2

## 2017-05-12 MED ORDER — SODIUM CHLORIDE 0.9 % IV BOLUS (SEPSIS)
1000.0000 mL | Freq: Once | INTRAVENOUS | Status: AC
  Administered 2017-05-12: 1000 mL via INTRAVENOUS

## 2017-05-12 NOTE — Discharge Instructions (Signed)
Please follow-up with wound care and continue with your radiology appointments.  Please follow-up with your family doctor or oncologist to have your blood levels rechecked next week.  Return to emergency department if worsening symptoms.

## 2017-05-12 NOTE — ED Provider Notes (Signed)
[place] DEPT Provider Note   CSN: [id number] Arrival date & time: 05/12/17  1024     History   Chief Complaint Chief Complaint  Patient presents with  . breast wound check    left  . Breast Pain    right and left breast    HPI Ann Oliver is a 61 y.o. female.  HPI  Ann Oliver is a 61 y.o. female with history of metastatic breast cancer, currently undergoing radiation and wound care, presents to emergency department for increased drainage from the breast, shortness of breath, pain.  Patient states that she last went to wound care last week where she had that wound dressed.  She has not changed the dressing at home since then.  She states it bleeds severely whenever the dressing is taken off.  She states the wound is actually looking better since starting radiation.  She denies any fever or chills at home.  She is taking her pain medications with some pain relief.  She states that she is here to check her vital signs. No other complaints.    Past Medical History:  Diagnosis Date  . Breast cancer ([place])   . Sinusitis     Patient Active Problem List   Diagnosis Date Noted  . Breast mass   . Acute blood loss anemia   . Hemorrhage from wound   . Ascites   . Evaluation by psychiatric service required 04/22/2017  . AKI (acute kidney injury) ([place]) 03/26/2017  . Leg swelling 03/26/2017  . Diarrhea 03/26/2017  . Leukocytosis 03/26/2017  . Breast wound 03/18/2017  . Blood loss anemia 01/18/2017  . Port catheter in place 12/02/2016  . Necrotizing soft tissue infection 11/21/2016  . Wound infection 10/28/2016  . Cellulitis of left breast 10/28/2016  . Encounter for palliative care   . Goals of care, counseling/discussion   . Breast cancer metastasized to lung, right ([place]) 09/25/2016  . Malnutrition of moderate degree 09/25/2016  . Breast cancer of upper-outer quadrant of left female breast ([place]) 10/22/2015  . THYROID NODULE, RIGHT  05/31/2007  . Depressed 05/31/2007  . GERD 05/31/2007  . HOT FLASHES 05/31/2007  . HEADACHE 05/31/2007    Past Surgical History:  Procedure Laterality Date  . BIOPSY BREAST    . IR FLUORO GUIDE CV LINE RIGHT  11/06/2016  . IR US GUIDE VASC ACCESS RIGHT  11/06/2016    OB History    No data available       Home Medications    Prior to Admission medications   Medication Sig Start Date End Date Taking? Authorizing Provider  ANUCORT-HC 25 MG suppository unwrap and insert 1 suppository rectally twice a day if needed for HEMORRHOIDS OR ANAL ITCHING 04/12/17   Causey, Charlestine Massed, NP  gabapentin (NEURONTIN) 600 MG tablet Take 1 tablet (600 mg total) by mouth 3 (three) times daily. 05/07/17   Bonnielee Haff, MD  mirtazapine (REMERON) 15 MG tablet Take 1 tablet (15 mg total) by mouth at bedtime. 05/07/17   Bonnielee Haff, MD  Multiple Vitamin (MULTIVITAMIN WITH MINERALS) TABS tablet Take 1 tablet by mouth daily. 05/07/17   Bonnielee Haff, MD  ondansetron (ZOFRAN) 8 MG tablet Take 1 tablet (8 mg total) by mouth every 8 (eight) hours as needed for nausea or vomiting. 05/07/17   Bonnielee Haff, MD  oxyCODONE (OXYCONTIN) 10 mg 12 hr tablet Take 1 tablet (10 mg total) by mouth every 12 (twelve) hours. 04/28/17   Rosita Fire, MD  oxyCODONE-acetaminophen (PERCOCET/ROXICET) 5-325 MG tablet Take 1-2 tablets by mouth every 6 (six) hours as needed for severe pain. 05/07/17   Bonnielee Haff, MD  polyethylene glycol [facility] / Floria Raveling) packet Take 17 g by mouth daily. 05/07/17   Bonnielee Haff, MD    Family History Family History  Family history unknown: Yes    Social History Social History   Tobacco Use  . Smoking status: Never Smoker  . Smokeless tobacco: Never Used  Substance Use Topics  . Alcohol use: No  . Drug use: No     Allergies   Aspirin   Review of Systems Review of Systems  Constitutional: Positive for fatigue. Negative for chills and fever.    Respiratory: Positive for shortness of breath. Negative for cough, choking and chest tightness.   Cardiovascular: Negative for chest pain.  Skin: Positive for wound.  Neurological: Positive for weakness.     Physical Exam Updated Vital Signs BP 123/76 (BP Location: Right Arm)   Pulse 99   Temp 98.6 F (37 C) (Oral)   Resp 20   Ht 5\' 2"  (1.575 m)   Wt 54.4 kg (120 lb)   SpO2 100%   BMI 21.95 kg/m   Physical Exam  Constitutional: She is oriented to person, place, and time. She appears well-developed and well-nourished. No distress.  HENT:  Head: Normocephalic.  Eyes: Conjunctivae are normal.  Neck: Neck supple.  Cardiovascular: Normal rate, regular rhythm and normal heart sounds.  Pulmonary/Chest: Effort normal and breath sounds normal. No respiratory distress. She has no wheezes. She has no rales.  Large mass the size of a cantaloupe to the left breast, with open, meaty tissue, with yellow drainage, no active bleeding.  Malodorous.  Musculoskeletal: She exhibits no edema.  Neurological: She is alert and oriented to person, place, and time.  Skin: Skin is warm and dry.  Psychiatric: She has a normal mood and affect. Her behavior is normal.  Nursing note and vitals reviewed.    ED Treatments / Results  Labs (all labs ordered are listed, but only abnormal results are displayed) Labs Reviewed  CBC WITH DIFFERENTIAL/PLATELET - Abnormal; Notable for the following components:      Result Value   WBC 22.8 (*)    RBC 2.78 (*)    Hemoglobin 7.9 (*)    HCT 25.0 (*)    RDW 17.5 (*)    Platelets 692 (*)    Neutro Abs 21.4 (*)    All other components within normal limits  COMPREHENSIVE METABOLIC PANEL - Abnormal; Notable for the following components:   Glucose, Bld 110 (*)    BUN 24 (*)    Albumin 2.4 (*)    Alkaline Phosphatase 129 (*)    All other components within normal limits  I-STAT CG4 LACTIC ACID, ED  I-STAT CG4 LACTIC ACID, ED    EKG  EKG  Interpretation None       Radiology Dg Chest 2 View  Result Date: 05/12/2017 CLINICAL DATA:  Metastatic breast cancer. EXAM: CHEST  2 VIEW COMPARISON:  CT chest dated April 20, 2017. Chest x-ray dated March 18, 2017. FINDINGS: The cardiomediastinal silhouette is normal in size. Normal pulmonary vascularity. Scattered nodular densities are noted in both lungs, consistent with known pulmonary metastatic disease. No focal consolidation, pleural effusion, or pneumothorax. No acute osseous abnormality. Grossly unchanged large left breast mass. IMPRESSION: 1. No active cardiopulmonary disease. Scattered pulmonary metastases, not significantly changed. 2. Unchanged large left breast mass. Electronically Signed   By:  Titus Dubin M.D.   On: 05/12/2017 14:58    Procedures Procedures (including critical care time)  Medications Ordered in ED Medications  sodium chloride 0.9 % bolus 1,000 mL (not administered)  oxyCODONE-acetaminophen (PERCOCET/ROXICET) 5-325 MG per tablet 1 tablet (1 tablet Oral Given 05/12/17 1342)     Initial Impression / Assessment and Plan / ED Course  I have reviewed the triage vital signs and the nursing notes.  Pertinent labs & imaging results that were available during my care of the patient were reviewed by me and considered in my medical decision making (see chart for details).     Pt in ED for left breast mass recheck.  PT with large malodorous mass to left breast with drainage and bleeding. States it is draining. No bleeding since last dressing changed a week ago. Initially tachycardic, BP borderline. Rechecked after sitting in the room for sometime, improved. Will change the dressing on the wound. Will give IV fluids. Labs pending.   Discussed with Dr. Jeneen Rinks. WBC elevated. However pt is afebrile here VS looking better. Normal lactic acid. Hgb similar to last. Wound is draining, however no bleeding at this time. No surrounding cellulitis. Pt states it looks  "better" than before. Will have pt follow up with her wound care team for further treatment.   4:16 PM Patient received IV fluids.  Her vital signs here are normal.  She is in no acute distress and nontoxic-appearing.  She is stable for discharge home at this time.  I will have a follow-up with wound care in her oncologist.  Discussed return precautions.   Discussed pt with Dr. Jeneen Rinks who agrees with the plan.   Vitals:   05/12/17 1046 05/12/17 1125 05/12/17 1344 05/12/17 1607  BP: 99/61  123/76 133/75  Pulse: (!) 110  99 99  Resp: 20  20 20   Temp: 98.6 F (37 C)     TempSrc: Oral     SpO2: 100%  100% 100%  Weight:  54.4 kg (120 lb)    Height:  5\' 2"  (1.575 m)       Final Clinical Impressions(s) / ED Diagnoses   Final diagnoses:  Malignant neoplasm of overlapping sites of left female breast, unspecified estrogen receptor status [facility])    ED Discharge Orders    None       Jeannett Senior, PA-C 05/12/17 1622    Tanna Furry, MD 05/15/17 1529

## 2017-05-12 NOTE — Progress Notes (Signed)
Performed medication history. Patient is a poor historian. She apparently did not pick up pain meds prescribed on 12/5 or 12/14.  Romeo Rabon, PharmD. Mobile: [phone]. 05/12/2017,3:46 PM.

## 2017-05-12 NOTE — ED Notes (Signed)
Cannot do an EKG because of wondcare.  Informed RN(Aleana).  Will get EKG upon arrival in room and expose wound.

## 2017-05-12 NOTE — ED Notes (Signed)
Bed: WTR6 Expected date:  Expected time:  Means of arrival:  Comments: 

## 2017-05-12 NOTE — ED Triage Notes (Signed)
Patient c/o increased drainage from the left breast wound, but continues to report increased pain of the right breast. Wound has a foul odor.

## 2017-05-19 ENCOUNTER — Emergency Department (HOSPITAL_COMMUNITY)
Admission: EM | Admit: 2017-05-19 | Discharge: 2017-05-20 | Disposition: A | Discharge status: Home / Self Care | Payer: Medicaid Other | Attending: Emergency Medicine | Admitting: Emergency Medicine

## 2017-05-19 ENCOUNTER — Encounter (HOSPITAL_COMMUNITY): Payer: Self-pay | Admitting: Emergency Medicine

## 2017-05-19 DIAGNOSIS — Z853 Personal history of malignant neoplasm of breast: Secondary | ICD-10-CM

## 2017-05-19 DIAGNOSIS — C50912 Malignant neoplasm of unspecified site of left female breast: Secondary | ICD-10-CM | POA: Diagnosis not present

## 2017-05-19 DIAGNOSIS — Z79899 Other long term (current) drug therapy: Secondary | ICD-10-CM | POA: Diagnosis not present

## 2017-05-19 DIAGNOSIS — Z5189 Encounter for other specified aftercare: Secondary | ICD-10-CM

## 2017-05-19 NOTE — ED Notes (Signed)
Pt called to be triaged x1 with no response.  RN notified.

## 2017-05-19 NOTE — ED Provider Notes (Signed)
[place] DEPT Provider Note   CSN: [id number] Arrival date & time: 05/19/17  1824     History   Chief Complaint Chief Complaint  Patient presents with  . Breast Wound    HPI Ann Oliver is a 61 y.o. female.  Patient is a 61 year old female with past medical history of cancer of the left breast.  She has a large, fungating mass to the left breast that is being treated with radiation.  She is having dressing changes performed at home, however has not had a dressing change since last week due to the holiday.  She presents here requesting a wound check and dressing change.  She denies any new complaints.   The history is provided by the patient.    Past Medical History:  Diagnosis Date  . Breast cancer ([place])   . Sinusitis     Patient Active Problem List   Diagnosis Date Noted  . Breast mass   . Acute blood loss anemia   . Hemorrhage from wound   . Ascites   . Evaluation by psychiatric service required 04/22/2017  . AKI (acute kidney injury) ([place]) 03/26/2017  . Leg swelling 03/26/2017  . Diarrhea 03/26/2017  . Leukocytosis 03/26/2017  . Breast wound 03/18/2017  . Blood loss anemia 01/18/2017  . Port catheter in place 12/02/2016  . Necrotizing soft tissue infection 11/21/2016  . Wound infection 10/28/2016  . Cellulitis of left breast 10/28/2016  . Encounter for palliative care   . Goals of care, counseling/discussion   . Breast cancer metastasized to lung, right ([place]) 09/25/2016  . Malnutrition of moderate degree 09/25/2016  . Breast cancer of upper-outer quadrant of left female breast ([place]) 10/22/2015  . THYROID NODULE, RIGHT 05/31/2007  . Depressed 05/31/2007  . GERD 05/31/2007  . HOT FLASHES 05/31/2007  . HEADACHE 05/31/2007    Past Surgical History:  Procedure Laterality Date  . BIOPSY BREAST    . IR FLUORO GUIDE CV LINE RIGHT  11/06/2016  . IR US GUIDE VASC ACCESS RIGHT  11/06/2016    OB History    No data  available       Home Medications    Prior to Admission medications   Medication Sig Start Date End Date Taking? Authorizing Provider  gabapentin (NEURONTIN) 600 MG tablet Take 1 tablet (600 mg total) by mouth 3 (three) times daily. 05/07/17   Bonnielee Haff, MD  mirtazapine (REMERON) 15 MG tablet Take 1 tablet (15 mg total) by mouth at bedtime. 05/07/17   Bonnielee Haff, MD  Multiple Vitamin (MULTIVITAMIN WITH MINERALS) TABS tablet Take 1 tablet by mouth daily. 05/07/17   Bonnielee Haff, MD  ondansetron (ZOFRAN) 8 MG tablet Take 1 tablet (8 mg total) by mouth every 8 (eight) hours as needed for nausea or vomiting. 05/07/17   Bonnielee Haff, MD  oxyCODONE (OXYCONTIN) 10 mg 12 hr tablet Take 1 tablet (10 mg total) by mouth every 12 (twelve) hours. Patient not taking: Reported on 05/12/2017 04/28/17   Rosita Fire, MD  oxyCODONE-acetaminophen (PERCOCET/ROXICET) 5-325 MG tablet Take 1-2 tablets by mouth every 6 (six) hours as needed for severe pain. Patient not taking: Reported on 05/12/2017 05/07/17   Bonnielee Haff, MD  polyethylene glycol [facility] / Floria Raveling) packet Take 17 g by mouth daily. Patient not taking: Reported on 05/12/2017 05/07/17   Bonnielee Haff, MD    Family History Family History  Family history unknown: Yes    Social History Social History   Tobacco Use  .  Smoking status: Never Smoker  . Smokeless tobacco: Never Used  Substance Use Topics  . Alcohol use: No  . Drug use: No     Allergies   Aspirin   Review of Systems Review of Systems  All other systems reviewed and are negative.    Physical Exam Updated Vital Signs BP 112/67 (BP Location: Left Arm)   Pulse 84   Temp 98.4 F (36.9 C) (Oral)   Resp 16   Ht 5\' 2"  (1.575 m)   Wt 54.4 kg (120 lb)   SpO2 100%   BMI 21.95 kg/m   Physical Exam  Constitutional: She is oriented to person, place, and time. She appears well-developed and well-nourished. No distress.  Patient is somewhat  thin and cachectic appearing.  HENT:  Head: Normocephalic and atraumatic.  Neck: Normal range of motion. Neck supple.  Cardiovascular: Normal rate and regular rhythm. Exam reveals no gallop and no friction rub.  No murmur heard. Pulmonary/Chest: Effort normal and breath sounds normal. No respiratory distress. She has no wheezes.  Abdominal: Soft. Bowel sounds are normal. She exhibits no distension. There is no tenderness.  Musculoskeletal: Normal range of motion.  Neurological: She is alert and oriented to person, place, and time.  Skin: Skin is warm and dry. She is not diaphoretic.  There is a dressing in place overlying the left breast.  There is greenish yellow drainage on the dressing material.  This was removed.  There is a large, fungating, malodorous mass to the left breast.  It is draining and there is a slight amount of bleeding to the mass tissue.  There is no significant underlying or surrounding warmth or erythema.  Nursing note and vitals reviewed.    ED Treatments / Results  Labs (all labs ordered are listed, but only abnormal results are displayed) Labs Reviewed - No data to display  EKG  EKG Interpretation None       Radiology No results found.  Procedures Procedures (including critical care time)  Medications Ordered in ED Medications - No data to display   Initial Impression / Assessment and Plan / ED Course  I have reviewed the triage vital signs and the nursing notes.  Pertinent labs & imaging results that were available during my care of the patient were reviewed by me and considered in my medical decision making (see chart for details).  Patient presenting for dressing change.  She has a history of a very large, fungating breast mass that is followed by oncology and wound care.  The existing dressing was removed.  There was minimal bleeding which was controlled with pressure.  Another dressing was applied and the patient will be discharged, to  follow-up with her wound care clinic.  Final Clinical Impressions(s) / ED Diagnoses   Final diagnoses:  None    ED Discharge Orders    None       Veryl Speak, MD 05/20/17 [phone]

## 2017-05-19 NOTE — ED Triage Notes (Signed)
Pt comes in wanting her breast wound dressing changed.  States her wound nurse did not come yesterday to change it due to the holiday.  Wound has foul smell.  Pt complains of chronic pain from the wound and states she has not had her gabapentin today and wants [place] to provide her with some. Ambulatory. No other complaints at this time.

## 2017-05-20 MED ORDER — GABAPENTIN 300 MG PO CAPS
600.0000 mg | ORAL_CAPSULE | Freq: Once | ORAL | Status: AC
  Administered 2017-05-20: 600 mg via ORAL
  Filled 2017-05-20: qty 2

## 2017-05-20 NOTE — Discharge Instructions (Signed)
Follow-up in wound care clinic in the next 2 days.

## 2017-05-24 DIAGNOSIS — D0502 Lobular carcinoma in situ of left breast: Secondary | ICD-10-CM | POA: Diagnosis not present

## 2017-05-24 DIAGNOSIS — Z9221 Personal history of antineoplastic chemotherapy: Secondary | ICD-10-CM | POA: Diagnosis not present

## 2017-05-24 NOTE — Progress Notes (Signed)
CSW met with patient at [facility] due to concerns for transportation. Patient was currently at her scheduled outpatient appointment. Patient has been having difficulty with transportation to appointments- states she has been asking friends for money or taking the bus. CSW provided patient with [employer] application/ went over application in detail with patient. CSW also provided patient with bus pass for transportation home once appointment is completed.   Kingsley Spittle, [facility] Emergency Room Clinical Social Worker [phone]

## 2017-05-25 DIAGNOSIS — T391X1A Poisoning by 4-Aminophenol derivatives, accidental (unintentional), initial encounter: Secondary | ICD-10-CM

## 2017-05-25 HISTORY — DX: Poisoning by 4-aminophenol derivatives, accidental (unintentional), initial encounter: T39.1X1A

## 2017-05-28 ENCOUNTER — Other Ambulatory Visit: Payer: Self-pay

## 2017-05-28 MED ORDER — LOPERAMIDE HCL 2 MG PO CAPS: 2.0000 mg | ORAL_CAPSULE | ORAL | 0 refills | Status: DC | PRN

## 2017-05-31 ENCOUNTER — Encounter (HOSPITAL_BASED_OUTPATIENT_CLINIC_OR_DEPARTMENT_OTHER): Payer: Medicaid Other | Attending: Internal Medicine

## 2017-05-31 DIAGNOSIS — L03313 Cellulitis of chest wall: Secondary | ICD-10-CM | POA: Diagnosis not present

## 2017-05-31 DIAGNOSIS — Z9221 Personal history of antineoplastic chemotherapy: Secondary | ICD-10-CM | POA: Diagnosis not present

## 2017-05-31 DIAGNOSIS — D0502 Lobular carcinoma in situ of left breast: Secondary | ICD-10-CM | POA: Diagnosis present

## 2017-06-01 ENCOUNTER — Inpatient Hospital Stay (HOSPITAL_COMMUNITY)
Admission: EM | Admit: 2017-06-01 | Discharge: 2017-06-04 | DRG: 918 | Disposition: A | Discharge status: Hospice | Payer: Medicaid Other | Attending: Internal Medicine | Admitting: Internal Medicine

## 2017-06-01 ENCOUNTER — Encounter (HOSPITAL_COMMUNITY): Payer: Self-pay | Admitting: Emergency Medicine

## 2017-06-01 ENCOUNTER — Other Ambulatory Visit: Payer: Self-pay

## 2017-06-01 DIAGNOSIS — T426X2A Poisoning by other antiepileptic and sedative-hypnotic drugs, intentional self-harm, initial encounter: Secondary | ICD-10-CM | POA: Diagnosis present

## 2017-06-01 DIAGNOSIS — F419 Anxiety disorder, unspecified: Secondary | ICD-10-CM | POA: Diagnosis present

## 2017-06-01 DIAGNOSIS — Z515 Encounter for palliative care: Secondary | ICD-10-CM | POA: Diagnosis present

## 2017-06-01 DIAGNOSIS — Z923 Personal history of irradiation: Secondary | ICD-10-CM

## 2017-06-01 DIAGNOSIS — G4700 Insomnia, unspecified: Secondary | ICD-10-CM | POA: Diagnosis present

## 2017-06-01 DIAGNOSIS — C50919 Malignant neoplasm of unspecified site of unspecified female breast: Secondary | ICD-10-CM

## 2017-06-01 DIAGNOSIS — C7800 Secondary malignant neoplasm of unspecified lung: Secondary | ICD-10-CM | POA: Diagnosis present

## 2017-06-01 DIAGNOSIS — G893 Neoplasm related pain (acute) (chronic): Secondary | ICD-10-CM | POA: Diagnosis present

## 2017-06-01 DIAGNOSIS — D500 Iron deficiency anemia secondary to blood loss (chronic): Secondary | ICD-10-CM

## 2017-06-01 DIAGNOSIS — D62 Acute posthemorrhagic anemia: Secondary | ICD-10-CM | POA: Diagnosis present

## 2017-06-01 DIAGNOSIS — Z9221 Personal history of antineoplastic chemotherapy: Secondary | ICD-10-CM

## 2017-06-01 DIAGNOSIS — R64 Cachexia: Secondary | ICD-10-CM | POA: Diagnosis present

## 2017-06-01 DIAGNOSIS — Z886 Allergy status to analgesic agent status: Secondary | ICD-10-CM | POA: Diagnosis not present

## 2017-06-01 DIAGNOSIS — T391X1A Poisoning by 4-Aminophenol derivatives, accidental (unintentional), initial encounter: Secondary | ICD-10-CM | POA: Diagnosis not present

## 2017-06-01 DIAGNOSIS — R110 Nausea: Secondary | ICD-10-CM | POA: Diagnosis not present

## 2017-06-01 DIAGNOSIS — D638 Anemia in other chronic diseases classified elsewhere: Secondary | ICD-10-CM | POA: Diagnosis present

## 2017-06-01 DIAGNOSIS — R450 Nervousness: Secondary | ICD-10-CM | POA: Diagnosis not present

## 2017-06-01 DIAGNOSIS — F331 Major depressive disorder, recurrent, moderate: Secondary | ICD-10-CM | POA: Diagnosis present

## 2017-06-01 DIAGNOSIS — Z66 Do not resuscitate: Secondary | ICD-10-CM | POA: Diagnosis present

## 2017-06-01 DIAGNOSIS — C50412 Malignant neoplasm of upper-outer quadrant of left female breast: Secondary | ICD-10-CM | POA: Diagnosis present

## 2017-06-01 DIAGNOSIS — R45851 Suicidal ideations: Secondary | ICD-10-CM

## 2017-06-01 DIAGNOSIS — Z560 Unemployment, unspecified: Secondary | ICD-10-CM | POA: Diagnosis not present

## 2017-06-01 DIAGNOSIS — Z6820 Body mass index (BMI) 20.0-20.9, adult: Secondary | ICD-10-CM | POA: Diagnosis not present

## 2017-06-01 DIAGNOSIS — R51 Headache: Secondary | ICD-10-CM | POA: Diagnosis not present

## 2017-06-01 DIAGNOSIS — T50902A Poisoning by unspecified drugs, medicaments and biological substances, intentional self-harm, initial encounter: Secondary | ICD-10-CM

## 2017-06-01 DIAGNOSIS — T391X2A Poisoning by 4-Aminophenol derivatives, intentional self-harm, initial encounter: Principal | ICD-10-CM | POA: Diagnosis present

## 2017-06-01 DIAGNOSIS — T1491XA Suicide attempt, initial encounter: Secondary | ICD-10-CM | POA: Diagnosis not present

## 2017-06-01 DIAGNOSIS — Z170 Estrogen receptor positive status [ER+]: Secondary | ICD-10-CM | POA: Diagnosis not present

## 2017-06-01 HISTORY — DX: Poisoning by 4-aminophenol derivatives, accidental (unintentional), initial encounter: T39.1X1A

## 2017-06-01 LAB — CBC WITH DIFFERENTIAL/PLATELET
BASOS PCT: 0 %
Basophils Absolute: 0 10*3/uL (ref 0.0–0.1)
EOS ABS: 0.1 10*3/uL (ref 0.0–0.7)
Eosinophils Relative: 1 %
HCT: 22.9 % — ABNORMAL LOW (ref 36.0–46.0)
Hemoglobin: 7.2 g/dL — ABNORMAL LOW (ref 12.0–15.0)
LYMPHS ABS: 1 10*3/uL (ref 0.7–4.0)
Lymphocytes Relative: 8 %
MCH: 27.2 pg (ref 26.0–34.0)
MCHC: 31.4 g/dL (ref 30.0–36.0)
MCV: 86.4 fL (ref 78.0–100.0)
MONO ABS: 0.4 10*3/uL (ref 0.1–1.0)
Monocytes Relative: 3 %
Neutro Abs: 10.8 10*3/uL — ABNORMAL HIGH (ref 1.7–7.7)
Neutrophils Relative %: 88 %
Platelets: 513 10*3/uL — ABNORMAL HIGH (ref 150–400)
RBC: 2.65 MIL/uL — ABNORMAL LOW (ref 3.87–5.11)
RDW: 18.9 % — ABNORMAL HIGH (ref 11.5–15.5)
WBC: 12.3 10*3/uL — ABNORMAL HIGH (ref 4.0–10.5)

## 2017-06-01 LAB — COMPREHENSIVE METABOLIC PANEL
ALBUMIN: 2.4 g/dL — AB (ref 3.5–5.0)
ALT: 23 U/L (ref 14–54)
AST: 41 U/L (ref 15–41)
Alkaline Phosphatase: 122 U/L (ref 38–126)
Anion gap: 9 (ref 5–15)
BILIRUBIN TOTAL: 0.5 mg/dL (ref 0.3–1.2)
BUN: 19 mg/dL (ref 6–20)
CO2: 23 mmol/L (ref 22–32)
Calcium: 8.3 mg/dL — ABNORMAL LOW (ref 8.9–10.3)
Chloride: 105 mmol/L (ref 101–111)
Creatinine, Ser: 0.69 mg/dL (ref 0.44–1.00)
GFR calc Af Amer: >60 mL/min (ref >60)
GFR calc non Af Amer: >60 mL/min (ref >60)
GLUCOSE: 94 mg/dL (ref 65–99)
POTASSIUM: 3.5 mmol/L (ref 3.5–5.1)
Sodium: 137 mmol/L (ref 135–145)
TOTAL PROTEIN: 6.6 g/dL (ref 6.5–8.1)

## 2017-06-01 LAB — GLUCOSE, CAPILLARY: Glucose-Capillary: 64 mg/dL — ABNORMAL LOW (ref 65–99)

## 2017-06-01 LAB — RAPID URINE DRUG SCREEN, HOSP PERFORMED
AMPHETAMINES: NOT DETECTED
Barbiturates: NOT DETECTED
Benzodiazepines: NOT DETECTED
COCAINE: NOT DETECTED
OPIATES: NOT DETECTED
TETRAHYDROCANNABINOL: NOT DETECTED

## 2017-06-01 LAB — SALICYLATE LEVEL: Salicylate Lvl: <7 mg/dL (ref 2.8–30.0)

## 2017-06-01 LAB — ACETAMINOPHEN LEVEL
Acetaminophen (Tylenol), Serum: 40 ug/mL — ABNORMAL HIGH (ref 10–30)
Acetaminophen (Tylenol), Serum: <10 ug/mL — ABNORMAL LOW (ref 10–30)

## 2017-06-01 LAB — CBG MONITORING, ED: Glucose-Capillary: 90 mg/dL (ref 65–99)

## 2017-06-01 LAB — PROTIME-INR
INR: 0.95
PROTHROMBIN TIME: 12.6 s (ref 11.4–15.2)

## 2017-06-01 LAB — MRSA PCR SCREENING: MRSA by PCR: NEGATIVE

## 2017-06-01 LAB — ETHANOL: Alcohol, Ethyl (B): <10 mg/dL (ref <10)

## 2017-06-01 LAB — AMMONIA: Ammonia: 16 umol/L (ref 9–35)

## 2017-06-01 MED ORDER — SODIUM CHLORIDE 0.9 % IV BOLUS (SEPSIS)
1000.0000 mL | Freq: Once | INTRAVENOUS | Status: AC
  Administered 2017-06-01: 1000 mL via INTRAVENOUS

## 2017-06-01 MED ORDER — HEPARIN SODIUM (PORCINE) 5000 UNIT/ML IJ SOLN
5000.0000 [IU] | Freq: Three times a day (TID) | INTRAMUSCULAR | Status: DC
  Administered 2017-06-01 (×2): 5000 [IU] via SUBCUTANEOUS
  Filled 2017-06-01 (×9): qty 1

## 2017-06-01 MED ORDER — ACETYLCYSTEINE LOAD VIA INFUSION
150.0000 mg/kg | Freq: Once | INTRAVENOUS | Status: AC
  Administered 2017-06-01: 8160 mg via INTRAVENOUS
  Filled 2017-06-01: qty 204

## 2017-06-01 MED ORDER — OXYCODONE HCL 5 MG PO TABS
5.0000 mg | ORAL_TABLET | ORAL | Status: DC | PRN
  Administered 2017-06-01 – 2017-06-02 (×8): 5 mg via ORAL
  Filled 2017-06-01 (×9): qty 1

## 2017-06-01 MED ORDER — ONDANSETRON HCL 4 MG/2ML IJ SOLN
4.0000 mg | Freq: Four times a day (QID) | INTRAMUSCULAR | Status: DC | PRN
  Administered 2017-06-01 – 2017-06-03 (×4): 4 mg via INTRAVENOUS
  Filled 2017-06-01 (×4): qty 2

## 2017-06-01 MED ORDER — SODIUM CHLORIDE 0.9 % IV SOLN: INTRAVENOUS | Status: DC

## 2017-06-01 MED ORDER — DEXTROSE 5 % IV SOLN
15.0000 mg/kg/h | INTRAVENOUS | Status: DC
  Administered 2017-06-01: 15 mg/kg/h via INTRAVENOUS
  Filled 2017-06-01: qty 200

## 2017-06-01 MED ORDER — OXYCODONE HCL ER 10 MG PO T12A
10.0000 mg | EXTENDED_RELEASE_TABLET | Freq: Two times a day (BID) | ORAL | Status: DC
  Administered 2017-06-01 – 2017-06-02 (×3): 10 mg via ORAL
  Filled 2017-06-01 (×3): qty 1

## 2017-06-01 MED ORDER — ACETYLCYSTEINE 200 MG/ML IV SOLN
15.0000 mg/kg/h | INTRAVENOUS | Status: DC
  Filled 2017-06-01: qty 150

## 2017-06-01 MED ORDER — MORPHINE SULFATE (PF) 2 MG/ML IV SOLN
2.0000 mg | INTRAVENOUS | Status: DC | PRN
  Administered 2017-06-01 – 2017-06-03 (×12): 4 mg via INTRAVENOUS
  Administered 2017-06-04: 2 mg via INTRAVENOUS
  Administered 2017-06-04: 4 mg via INTRAVENOUS
  Administered 2017-06-04: 2 mg via INTRAVENOUS
  Filled 2017-06-01 (×4): qty 2
  Filled 2017-06-01: qty 1
  Filled 2017-06-01 (×8): qty 2
  Filled 2017-06-01: qty 1
  Filled 2017-06-01 (×2): qty 2

## 2017-06-01 MED ORDER — MORPHINE SULFATE (PF) 2 MG/ML IV SOLN
1.0000 mg | INTRAVENOUS | Status: DC | PRN
  Administered 2017-06-01: 2 mg via INTRAVENOUS
  Filled 2017-06-01: qty 1

## 2017-06-01 MED ORDER — ACETYLCYSTEINE LOAD VIA INFUSION
150.0000 mg/kg | Freq: Once | INTRAVENOUS | Status: DC
  Filled 2017-06-01: qty 204

## 2017-06-01 MED ORDER — SODIUM CHLORIDE 0.9 % IV SOLN
250.0000 mL | INTRAVENOUS | Status: DC | PRN
  Administered 2017-06-01: 250 mL via INTRAVENOUS

## 2017-06-01 MED ORDER — KETOROLAC TROMETHAMINE 30 MG/ML IJ SOLN
15.0000 mg | Freq: Once | INTRAMUSCULAR | Status: AC
  Administered 2017-06-01: 15 mg via INTRAVENOUS
  Filled 2017-06-01: qty 1

## 2017-06-01 NOTE — ED Notes (Signed)
Dr.Palumbo at bedside explaining why pain medications cannot be given at this time. Pt continually requesting pain medications

## 2017-06-01 NOTE — H&P (Signed)
Name: Ann Oliver MRN: [id number] DOB: 1955/08/07    ADMISSION DATE:  06/01/2017 CONSULTATION DATE: June 01, 2017  REFERRING MD : Dr. Randal Buba  CHIEF COMPLAINT: Acetaminophen overdose  HISTORY OF PRESENT ILLNESS: 62 year old female with past medical history as below, which is significant for metastatic adenocarcinoma of the breast status post palliative chemo and radiation.  She was referred to hospice by her oncologist and was initially on board with this, however, did later changed her mind opting for full code.  She has a fungating wound on her left breast which has bled significantly over the her recent course requiring multiple blood transfusion.  She has dressing changes at the wound center regularly.  1/8 she presented to the emergency department after intentional overdose of acetaminophen and gabapentin.  She reports having pain from head to toe due to her cancer for which she typically takes narcotic pain medications.  She ran out of these pain medications and was unable to get a prescription for more so she opted for over-the-counter solution such as acetaminophen and ibuprofen.  She reports taking "a few "ibuprofen, and most of the bottle of regular strength Tylenol.  She also took the remainder of her available gabapentin.  She does not know the number, but she does know that the prescription was very recently filled.  When asked whether taking all of this medication was an attempt to treat her pain or harm herself she states "probably a little bit of both ".  Laboratory evaluation in the emergency department significant for acetaminophen level 40, AST 41, ALT 23, and normal alcohol and aspirin levels.  She was started on an N-acetylcysteine infusion and placed on suicide precautions.  PCCM asked to admit.  SIGNIFICANT EVENTS  1/8 admit  STUDIES:    PAST MEDICAL HISTORY :   has a past medical history of Breast cancer ([place]) and Sinusitis.  has a past surgical history that  includes Biopsy breast; IR US Guide Vasc Access Right (11/06/2016); and IR Fluoro Guide CV Line Right (11/06/2016). Prior to Admission medications   Medication Sig Start Date End Date Taking? Authorizing Provider  gabapentin (NEURONTIN) 600 MG tablet Take 1 tablet (600 mg total) by mouth 3 (three) times daily. 05/07/17  Yes Bonnielee Haff, MD  loperamide (IMODIUM) 2 MG capsule Take 1 capsule (2 mg total) by mouth as needed for diarrhea or loose stools. 05/28/17  Yes Nicholas Lose, MD  ondansetron (ZOFRAN) 8 MG tablet Take 1 tablet (8 mg total) by mouth every 8 (eight) hours as needed for nausea or vomiting. 05/07/17  Yes Bonnielee Haff, MD  oxyCODONE (OXYCONTIN) 10 mg 12 hr tablet Take 1 tablet (10 mg total) by mouth every 12 (twelve) hours. 04/28/17  Yes Rosita Fire, MD  mirtazapine (REMERON) 15 MG tablet Take 1 tablet (15 mg total) by mouth at bedtime. Patient not taking: Reported on 06/01/2017 05/07/17   Bonnielee Haff, MD  Multiple Vitamin (MULTIVITAMIN WITH MINERALS) TABS tablet Take 1 tablet by mouth daily. Patient not taking: Reported on 06/01/2017 05/07/17   Bonnielee Haff, MD   Allergies  Allergen Reactions  . Aspirin Palpitations    FAMILY HISTORY:  Family history is unknown by patient. SOCIAL HISTORY:  reports that  has never smoked. she has never used smokeless tobacco. She reports that she does not drink alcohol or use drugs.  Review of Systems:   Bolds are positive  Constitutional: weight loss, gain, night sweats, Fevers, chills, fatigue .  HEENT: headaches, Sore throat, sneezing, nasal  congestion, post nasal drip, Difficulty swallowing, Tooth/dental problems, visual complaints visual changes, ear ache CV:  chest pain, radiates:,Orthopnea, PND, swelling in lower extremities, dizziness, palpitations, syncope.  GI  heartburn, indigestion, abdominal pain, nausea, vomiting, diarrhea, change in bowel habits, loss of appetite, bloody stools.  Resp: cough, productive:,  hemoptysis, dyspnea, chest pain, pleuritic.  Skin: rash or itching or icterus GU: dysuria, change in color of urine, urgency or frequency. flank pain, hematuria  OH:YWVP all over head to toe or swelling. decreased range of motion  Psych: change in mood or affect. depression or anxiety.  Neuro: difficulty with speech, weakness, numbness, ataxia    SUBJECTIVE:   VITAL SIGNS: Pulse Rate:  [78-101] 83 (01/08 0300) Resp:  [16-26] 26 (01/08 0300) BP: (111-125)/(60-69) 115/64 (01/08 0300) SpO2:  [100 %] 100 % (01/08 0300)  PHYSICAL EXAMINATION: General:  Cachectic female appears older than stated age Neuro:  Alert, oriented, non-focal.  HEENT:  [place]/AT, PERRL, no JVD Cardiovascular:  Tachy, regular, no MRG Lungs:  Clear Abdomen:  Soft, non-distended, non-tender Musculoskeletal:  No acute deformity or ROM limitation Skin:  L breast wound, unable to assess as dressing was just changed today, and patient has asked [place] not to remove dressing.   Recent Labs  Lab 06/01/17 0137  NA 137  K 3.5  CL 105  CO2 23  BUN 19  CREATININE 0.69  GLUCOSE 94   Recent Labs  Lab 06/01/17 0137  HGB 7.2*  HCT 22.9*  WBC 12.3*  PLT 513*   UDS neg  No results found.  ASSESSMENT / PLAN:  Overdose, intentional: Acetaminophen and gabapentin. She knew she took too many, but largely was trying to control her severe cancer related pain. She seems competent to make decisions regarding her care.  - ICU admission for monitoring and infusions - N- Acetylcysteine  Infusion continue - Psychiatry consult please call in AM to rule out suicidality  - Trend acetaminophen levels, LFT.   Metastatic adenocarcinoma of breast - s/p palliative chemo/radiation.  - Hospice care recommended by oncology - Will consult palliative care to assist with symptom management - Care management consult to determine status of home hospice.  - Treat pain with iv morphine - DNR  Anemia: multifactorial in the setting of chronic  illness and acute blood loss from wound - Follow CBC - Transfuse for hemoglobin < 7.   Diet: Regular VTE ppx: subcutaneous heparin, SCDs Code: DNR Dispo: ICU  Georgann Housekeeper, AGACNP-BC [facility] Pulmonology/Critical Care Pager [phone] or [phone]  06/01/2017 4:15 AM

## 2017-06-01 NOTE — Care Management (Signed)
CM consulted for home with hospice.  Per CM noted dated 04/30/17 -  Pt was set up with [facility] however pt later declined services due to medical plan parameters that are outside of hospice path.  CM requested [facility] Palliative consult for goals of care as pt is still actively pursuing cancer treatment.  CM will continue to follow for discharge needs

## 2017-06-01 NOTE — Consult Note (Signed)
Consultation Note Date: 06/01/2017   Patient Name: Ann Oliver  DOB: 04-22-1956  MRN: [id number]  Age / Sex: 62 y.o., female  PCP: [facility] Referring Physician: Reyne Dumas, MD  Reason for Consultation: Establishing goals of care  HPI/Patient Profile: 62 y.o. 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 Chart review done, patient reportedly was recently set up with home hospice, patient to look around 58 tablets of Tylenol, entire bottle of Neurontin over the course of the past 1-2 days prior to arrival to the emergency department, questionable suicidal ideation/intent. Patient is admitted to medical intensive care unit, is on N acetyl cystine drip, undergoing serial evaluations of her Tylenol level as well as liver function tests, [place] is following, she has a Air cabin crew at the bedside, psychiatry consult is pending, palliative consultation for ongoing goals of care discussions requested.  [name] is sitting up in bed. She is awake and alert. She is tolerating diet well. She has some mild nausea. She states that she got to hurting pretty bad. She was hurting on her chest wall, left side, essentially everywhere. She believes she still gets her pain medicine prescriptions filled through Dr. Geralyn Flash office. She states she went to the pharmacy to collect her prescription but was  unsuccessful.  [name] remains evasive as ever. She declines multiple attempts to engage in further discussions about the serious nature of her illness as well as the serious nature of this particular hospitalization. She does not want her family involved. She states she lives by herself and gets by with help from some people, she declines to elaborate who they are. We discussed about goals of care, what her wishes are, and her ongoing decline from serious irreversible illness. [name] understands. See recommendations below. Thank you for the consult.  NEXT OF KIN  reportedly has a son, she doesn't want him involved, will not give [place] any family's contact information, she has been consistent about this throughout her recent multiple hospitalizations.   SUMMARY OF RECOMMENDATIONS    Agree with DNR DNI.  Recommend psych consult regarding possible suicidal ideation/intent.  Patient agreeable to home with hospice, ?was involved at least peripherally with hosp[facility] of [place], [place]. Recommend case manager consult to establish hospice of [facility] referral process.   She is to follow up with rad onc later this month, she recently completed palliative radiation in December 2018.   Continue current pain regimen, she says her medical oncologist/cancer center refills her opioids.   Code Status/Advance Care Planning:  DNR    Symptom Management:    as above   Palliative  Prophylaxis:   Bowel Regimen  Additional Recommendations (Limitations, Scope, Preferences):  Full Scope Treatment  Psycho-social/Spiritual:   Desire for further Chaplaincy support:yes  Additional Recommendations: Caregiving  Support/Resources  Prognosis:   < 6 months  Discharge Planning: Home with Hospice      Primary Diagnoses: Present on Admission: . Tylenol overdose   I have reviewed the medical record, interviewed the patient and family, and examined the patient. The following aspects are  pertinent.  Past Medical History:  Diagnosis Date  . Breast cancer ([place])   . Sinusitis    Social History   Socioeconomic History  . Marital status: Single    Spouse name: None  . Number of children: None  . Years of education: None  . Highest education level: None  Social Needs  . Financial resource strain: None  . Food insecurity - worry: None  . Food insecurity - inability: None  . Transportation needs - medical: None  . Transportation needs - non-medical: None  Occupational History  . Occupation: unemployed  Tobacco Use  . Smoking status: Never Smoker  . Smokeless tobacco: Never Used  Substance and Sexual Activity  . Alcohol use: No  . Drug use: No  . Sexual activity: None  Other Topics Concern  . None  Social History Narrative  . None   Family History  Family history unknown: Yes   Scheduled Meds: . heparin  5,000 Units Subcutaneous Q8H  . oxyCODONE  10 mg Oral Q12H   Continuous Infusions: . sodium chloride    . sodium chloride    . acetylcysteine 15 mg/kg/hr (06/01/17 0900)   PRN Meds:.sodium chloride, morphine injection, oxyCODONE Medications Prior to Admission:  Prior to Admission medications   Medication Sig Start Date End Date Taking? Authorizing Provider  gabapentin (NEURONTIN) 600 MG tablet Take 1 tablet (600 mg total) by mouth 3 (three) times daily. 05/07/17  Yes Bonnielee Haff, MD  loperamide (IMODIUM) 2 MG capsule Take 1 capsule (2 mg total) by mouth as needed for diarrhea or loose stools. 05/28/17  Yes Nicholas Lose, MD  ondansetron (ZOFRAN) 8 MG tablet Take 1 tablet (8 mg total) by mouth every 8 (eight) hours as needed for nausea or vomiting. 05/07/17  Yes Bonnielee Haff, MD  oxyCODONE (OXYCONTIN) 10 mg 12 hr tablet Take 1 tablet (10 mg total) by mouth every 12 (twelve) hours. 04/28/17  Yes Rosita Fire, MD  mirtazapine (REMERON) 15 MG tablet Take 1 tablet (15 mg total) by mouth at bedtime. Patient not taking: Reported on 06/01/2017  05/07/17   Bonnielee Haff, MD  Multiple Vitamin (MULTIVITAMIN WITH MINERALS) TABS tablet Take 1 tablet by mouth daily. Patient not taking: Reported on 06/01/2017 05/07/17   Bonnielee Haff, MD   Allergies  Allergen Reactions  . Aspirin Palpitations   Review of Systems +pain in breast area +mild nausea  Physical Exam Awake alert In mild distress S1 S2 Clear Chest wall dressing No edema Thin, dry skin  Vital Signs: BP 128/69   Pulse 78   Temp 97.9 F (36.6 C) (Oral)   Resp (!) 22   Ht 5\' 2"  (1.575 m)   Wt 50.4 kg (111 lb 1.8 oz)   SpO2 100%   BMI 20.32 kg/m  Pain Assessment: 0-10   Pain Score: 7    SpO2: SpO2: 100 % O2 Device:SpO2: 100 % O2 Flow Rate: .   IO: Intake/output summary:   Intake/Output Summary (Last 24 hours) at 06/01/2017 1340 Last data filed at 06/01/2017 1023 Gross  per 24 hour  Intake 1636.1 ml  Output -  Net 1636.1 ml   PPS 30% LBM:   Baseline Weight: Weight: 50.4 kg (111 lb 1.8 oz) Most recent weight: Weight: 50.4 kg (111 lb 1.8 oz)     Palliative Assessment/Data:   Flowsheet Rows     Most Recent Value  Intake Tab  Referral Department  Hospitalist  Unit at Time of Referral  ICU  Palliative Care Primary Diagnosis  Cancer  Palliative Care Type  Return patient Palliative Care  Reason for referral  Pain, Clarify Goals of Care  Clinical Assessment  Palliative Performance Scale Score  30%  Pain Max last 24 hours  5  Pain Min Last 24 hours  4  Dyspnea Max Last 24 Hours  4  Dyspnea Min Last 24 hours  3  Psychosocial & Spiritual Assessment  Palliative Care Outcomes  Patient/Family meeting held?  Yes  Who was at the meeting?  patient      Time In:  12 Time Out:  1310 Time Total:  70 min  Greater than 50%  of this time was spent counseling and coordinating care related to the above assessment and plan.  Signed by: Loistine Chance, MD  [phone]  Please contact Palliative Medicine Team phone at [phone] for questions and concerns.  For  individual provider: See Shea Evans

## 2017-06-01 NOTE — ED Notes (Signed)
Sitter at bedside.

## 2017-06-01 NOTE — ED Provider Notes (Signed)
[facility] EMERGENCY DEPARTMENT Provider Note   CSN: [id number] Arrival date & time: 06/01/17  0033     History   Chief Complaint Chief Complaint  Patient presents with  . Ingestion  . Suicidal    HPI Ann Oliver is a 62 y.o. female.   Ingestion  This is a new problem. The current episode started yesterday. The problem occurs constantly. The problem has not changed since onset.Pertinent negatives include no chest pain, no abdominal pain, no headaches and no shortness of breath. Nothing aggravates the symptoms. Nothing relieves the symptoms. She has tried nothing for the symptoms. The treatment provided no relief.  Has had nausea and diarrhea.  Took a new bottle of neurontin and at least 50 tylenol.  Tylenol was taken over 24 hours.  Neurontin was taken at once.    Past Medical History:  Diagnosis Date  . Breast cancer ([place])   . Sinusitis     Patient Active Problem List   Diagnosis Date Noted  . Breast mass   . Acute blood loss anemia   . Hemorrhage from wound   . Ascites   . Evaluation by psychiatric service required 04/22/2017  . AKI (acute kidney injury) ([place]) 03/26/2017  . Leg swelling 03/26/2017  . Diarrhea 03/26/2017  . Leukocytosis 03/26/2017  . Breast wound 03/18/2017  . Blood loss anemia 01/18/2017  . Port catheter in place 12/02/2016  . Necrotizing soft tissue infection 11/21/2016  . Wound infection 10/28/2016  . Cellulitis of left breast 10/28/2016  . Encounter for palliative care   . Goals of care, counseling/discussion   . Breast cancer metastasized to lung, right ([place]) 09/25/2016  . Malnutrition of moderate degree 09/25/2016  . Breast cancer of upper-outer quadrant of left female breast ([place]) 10/22/2015  . THYROID NODULE, RIGHT 05/31/2007  . Depressed 05/31/2007  . GERD 05/31/2007  . HOT FLASHES 05/31/2007  . HEADACHE 05/31/2007    Past Surgical History:  Procedure Laterality Date  . BIOPSY BREAST    . IR FLUORO GUIDE  CV LINE RIGHT  11/06/2016  . IR US GUIDE VASC ACCESS RIGHT  11/06/2016    OB History    No data available       Home Medications    Prior to Admission medications   Medication Sig Start Date End Date Taking? Authorizing Provider  gabapentin (NEURONTIN) 600 MG tablet Take 1 tablet (600 mg total) by mouth 3 (three) times daily. 05/07/17   Bonnielee Haff, MD  loperamide (IMODIUM) 2 MG capsule Take 1 capsule (2 mg total) by mouth as needed for diarrhea or loose stools. 05/28/17   Nicholas Lose, MD  mirtazapine (REMERON) 15 MG tablet Take 1 tablet (15 mg total) by mouth at bedtime. 05/07/17   Bonnielee Haff, MD  Multiple Vitamin (MULTIVITAMIN WITH MINERALS) TABS tablet Take 1 tablet by mouth daily. 05/07/17   Bonnielee Haff, MD  ondansetron (ZOFRAN) 8 MG tablet Take 1 tablet (8 mg total) by mouth every 8 (eight) hours as needed for nausea or vomiting. 05/07/17   Bonnielee Haff, MD  oxyCODONE (OXYCONTIN) 10 mg 12 hr tablet Take 1 tablet (10 mg total) by mouth every 12 (twelve) hours. 04/28/17   Rosita Fire, MD    Family History Family History  Family history unknown: Yes    Social History Social History   Tobacco Use  . Smoking status: Never Smoker  . Smokeless tobacco: Never Used  Substance Use Topics  . Alcohol use: No  . Drug use:  No     Allergies   Aspirin   Review of Systems Review of Systems  Constitutional: Negative for fever.  Respiratory: Negative for shortness of breath.   Cardiovascular: Negative for chest pain.  Gastrointestinal: Positive for diarrhea and nausea. Negative for abdominal pain.  Neurological: Negative for headaches.  All other systems reviewed and are negative.    Physical Exam Updated Vital Signs BP 114/62 (BP Location: Right Arm)   Pulse 87   Resp 18   SpO2 100%   Physical Exam  Constitutional: She appears well-developed and well-nourished. No distress.  HENT:  Head: Normocephalic and atraumatic.  Mouth/Throat: No  oropharyngeal exudate.  Eyes: Conjunctivae are normal. Pupils are equal, round, and reactive to light.  Neck: Normal range of motion. Neck supple.  Cardiovascular: Normal rate, regular rhythm, normal heart sounds and intact distal pulses.  Pulmonary/Chest: Effort normal and breath sounds normal. No stridor. She has no wheezes. She has no rales.  Abdominal: Soft. Bowel sounds are normal. She exhibits no mass. There is no tenderness. There is no rebound and no guarding.  Musculoskeletal: Normal range of motion. She exhibits no edema.  Neurological: She is alert. She displays normal reflexes.  Skin: Skin is warm and dry. Capillary refill takes less than 2 seconds.  Psychiatric: Her affect is blunt. She expresses impulsivity and inappropriate judgment.     ED Treatments / Results  Labs (all labs ordered are listed, but only abnormal results are displayed)  Results for orders placed or performed during the hospital encounter of 06/01/17  Comprehensive metabolic panel  Result Value Ref Range   Sodium 137 135 - 145 mmol/L   Potassium 3.5 3.5 - 5.1 mmol/L   Chloride 105 101 - 111 mmol/L   CO2 23 22 - 32 mmol/L   Glucose, Bld 94 65 - 99 mg/dL   BUN 19 6 - 20 mg/dL   Creatinine, Ser 0.69 0.44 - 1.00 mg/dL   Calcium 8.3 (L) 8.9 - 10.3 mg/dL   Total Protein 6.6 6.5 - 8.1 g/dL   Albumin 2.4 (L) 3.5 - 5.0 g/dL   AST 41 15 - 41 U/L   ALT 23 14 - 54 U/L   Alkaline Phosphatase 122 38 - 126 U/L   Total Bilirubin 0.5 0.3 - 1.2 mg/dL   GFR calc non Af Amer >60 >60 mL/min   GFR calc Af Amer >60 >60 mL/min   Anion gap 9 5 - 15  Salicylate level  Result Value Ref Range   Salicylate Lvl <9.9 2.8 - 30.0 mg/dL  Acetaminophen level  Result Value Ref Range   Acetaminophen (Tylenol), Serum 40 (H) 10 - 30 ug/mL  Ethanol  Result Value Ref Range   Alcohol, Ethyl (B) <10 <10 mg/dL  CBC WITH DIFFERENTIAL  Result Value Ref Range   WBC 12.3 (H) 4.0 - 10.5 K/uL   RBC 2.65 (L) 3.87 - 5.11 MIL/uL    Hemoglobin 7.2 (L) 12.0 - 15.0 g/dL   HCT 22.9 (L) 36.0 - 46.0 %   MCV 86.4 78.0 - 100.0 fL   MCH 27.2 26.0 - 34.0 pg   MCHC 31.4 30.0 - 36.0 g/dL   RDW 18.9 (H) 11.5 - 15.5 %   Platelets 513 (H) 150 - 400 K/uL   Neutrophils Relative % 88 %   Lymphocytes Relative 8 %   Monocytes Relative 3 %   Eosinophils Relative 1 %   Basophils Relative 0 %   Neutro Abs 10.8 (H) 1.7 - 7.7 K/uL  Lymphs Abs 1.0 0.7 - 4.0 K/uL   Monocytes Absolute 0.4 0.1 - 1.0 K/uL   Eosinophils Absolute 0.1 0.0 - 0.7 K/uL   Basophils Absolute 0.0 0.0 - 0.1 K/uL   Smear Review MORPHOLOGY UNREMARKABLE   Protime-INR  Result Value Ref Range   Prothrombin Time 12.6 11.4 - 15.2 seconds   INR 0.95   Ammonia  Result Value Ref Range   Ammonia 16 9 - 35 umol/L  CBG monitoring, ED  Result Value Ref Range   Glucose-Capillary 90 65 - 99 mg/dL   Dg Chest 2 View  Result Date: 05/12/2017 CLINICAL DATA:  Metastatic breast cancer. EXAM: CHEST  2 VIEW COMPARISON:  CT chest dated April 20, 2017. Chest x-ray dated March 18, 2017. FINDINGS: The cardiomediastinal silhouette is normal in size. Normal pulmonary vascularity. Scattered nodular densities are noted in both lungs, consistent with known pulmonary metastatic disease. No focal consolidation, pleural effusion, or pneumothorax. No acute osseous abnormality. Grossly unchanged large left breast mass. IMPRESSION: 1. No active cardiopulmonary disease. Scattered pulmonary metastases, not significantly changed. 2. Unchanged large left breast mass. Electronically Signed   By: Titus Dubin M.D.   On: 05/12/2017 14:58    EKG  Date: 06/01/2017  Rate: 82  Rhythm: normal sinus rhythm  QRS Axis: normal  Intervals: normal  ST/T Wave abnormalities: normal  Conduction Disutrbances: none  Narrative Interpretation: unremarkable      Procedures Procedures (including critical care time)  Medications Ordered in ED Medications  acetylcysteine (ACETADOTE) 40 mg/mL load via  infusion 8,160 mg (not administered)    Followed by  acetylcysteine (ACETADOTE) 40,000 mg in dextrose 5 % 1,000 mL (40 mg/mL) infusion (not administered)  sodium chloride 0.9 % bolus 1,000 mL (not administered)    MDM Reviewed: previous chart, nursing note and vitals Reviewed previous: labs Interpretation: labs (elevated tylenol level low albumin normal coags and LFTS) Total time providing critical care: > 105 minutes (acetylcysteine started by me immediately, also filled out commitment paperwork for patient. frequent monitoring of the patient ). This excludes time spent performing separately reportable procedures and services. Consults: critical care  CRITICAL CARE Performed by: Carlisle Beers Total critical care time: 120 minutes Critical care time was exclusive of separately billable procedures and treating other patients. Critical care was necessary to treat or prevent imminent or life-threatening deterioration. Critical care was time spent personally by me on the following activities: development of treatment plan with patient and/or surrogate as well as nursing, discussions with consultants, evaluation of patient's response to treatment, examination of patient, obtaining history from patient or surrogate, ordering and performing treatments and interventions, ordering and review of laboratory studies, ordering and review of radiographic studies, pulse oximetry and re-evaluation of patient's condition.   Final Clinical Impressions(s) / ED Diagnoses   Intentional overdose on Tylenol and neurontin.  The Rumack matthews nomogram is unhelpful as the ingestion was take over a period of 24 hours.  I started NAC immediately on arrival.  See nursing note for poison control call.    Frequent monitoring of patient's level of alertness by me.   Commitment paperwork completed by me  Case d/w Dr. Madalyn Rob of PCCM, PCCM to admit    [facility], Burlie Cajamarca, MD 06/01/17 [phone]

## 2017-06-01 NOTE — ED Triage Notes (Signed)
Per EMS pt from home. Chronic wound to left chest was causing pt increased pain that wasn't relieved with her normal medications so she went yesterday and got a bottle of acetaminophen.  Pt has ingested approximately 58-500 mg tablets since yesterday.  When pt was asked if this was intentional she stated "maybe" and became tearful.  States "I need suicide watch"

## 2017-06-01 NOTE — ED Notes (Signed)
Poison control called to check on pt's status. Updated on labs and vital signs as well as EKG results. Advised to draw repeat acetaminophen level approximately 1 hour before end of

## 2017-06-01 NOTE — Progress Notes (Signed)
MEDICATION RELATED CONSULT NOTE - INITIAL   Pharmacy Consult for Acetadote Indication: Acetaminophen Overdose  Allergies  Allergen Reactions  . Aspirin Palpitations    Patient Measurements:    Vital Signs: BP: 115/64 (01/08 0300) Pulse Rate: 83 (01/08 0300) Intake/Output from previous day: No intake/output data recorded. Intake/Output from this shift: No intake/output data recorded.  Labs: Recent Labs    06/01/17 0137  WBC 12.3*  HGB 7.2*  HCT 22.9*  PLT 513*  CREATININE 0.69  ALBUMIN 2.4*  PROT 6.6  AST 41  ALT 23  ALKPHOS 122  BILITOT 0.5   Estimated Creatinine Clearance: 57.7 mL/min (by C-G formula based on SCr of 0.69 mg/dL).   Microbiology: Recent Results (from the past 720 hour(s))  Culture, Urine     Status: Abnormal   Collection Time: 05/02/17  9:00 PM  Result Value Ref Range Status   Specimen Description URINE, CLEAN CATCH  Final   Special Requests NONE  Final   Culture MULTIPLE SPECIES PRESENT, SUGGEST RECOLLECTION (A)  Final   Report Status 05/05/2017 FINAL  Final    Medical History: Past Medical History:  Diagnosis Date  . Breast cancer ([place])   . Sinusitis     Medications:  Current Facility-Administered Medications on File Prior to Encounter  Medication Dose Route Frequency Provider Last Rate Last Dose  . sodium chloride flush (NS) 0.9 % injection 10 mL  10 mL Intracatheter PRN Nicholas Lose, MD   10 mL at 12/25/16 1702   Current Outpatient Medications on File Prior to Encounter  Medication Sig Dispense Refill  . gabapentin (NEURONTIN) 600 MG tablet Take 1 tablet (600 mg total) by mouth 3 (three) times daily. 90 tablet 1  . loperamide (IMODIUM) 2 MG capsule Take 1 capsule (2 mg total) by mouth as needed for diarrhea or loose stools. 30 capsule 0  . mirtazapine (REMERON) 15 MG tablet Take 1 tablet (15 mg total) by mouth at bedtime. 30 tablet 0  . Multiple Vitamin (MULTIVITAMIN WITH MINERALS) TABS tablet Take 1 tablet by mouth daily. 30  tablet 0  . ondansetron (ZOFRAN) 8 MG tablet Take 1 tablet (8 mg total) by mouth every 8 (eight) hours as needed for nausea or vomiting. 20 tablet 0  . oxyCODONE (OXYCONTIN) 10 mg 12 hr tablet Take 1 tablet (10 mg total) by mouth every 12 (twelve) hours. 10 tablet 0     Assessment: 62 y.o. female admitted with acetaminophen and gabapentin overdose for Acetadote.  Initial acetaminophen level 40  INR 0.95  Plan:  Acetadote 150 mg/kg bolus, 15 mg/kg/hr  Recheck aceteminophen level, INR and hepatic panel at 24 hours, and f/u with Poison Control for recommendations  [place], [place] 06/01/2017,3:16 AM

## 2017-06-01 NOTE — ED Notes (Signed)
Pt tearful, states "i'm ready to go" "yall better watch me"  "Im not safe anywhere"

## 2017-06-01 NOTE — ED Notes (Signed)
Per pt also ingested a new refill of gabapentin. Script was for 600 mg 3 times a day, 30 day supply.

## 2017-06-01 NOTE — ED Notes (Signed)
Ordered tray 

## 2017-06-01 NOTE — ED Notes (Signed)
Per Andee Poles at [facility] control pt needs observation and acetylcysteine.  Gabapentin overdose may cause hypotension and somnolence.  Observe and supportive care.

## 2017-06-01 NOTE — Progress Notes (Signed)
eLink Physician-Brief Progress Note Patient Name: Ann Oliver DOB: 31-Mar-1956 MRN: [id number]   Date of Service  06/01/2017  HPI/Events of Note  Nausea - QTc interval = 0.46 seconds.   eICU Interventions  Will order: 1. Zofran 4 mg IV Q 6 hours PRN N/V. 2. Monitor QTc interval Q 6 hours. Notify MD if QTc interval > 500 milliseconds.      Intervention Category Major Interventions: Other:  Lysle Dingwall 06/01/2017, 7:31 PM

## 2017-06-01 NOTE — Consult Note (Addendum)
WOC consult requested for left necrotic breast wound.  Pt is familiar to [facility] team from recent visit; refer to progress notes on 12/5.  Pt has a chronic breast mass which bleeds a large amt at times during the dressing changes.  She is followed as an outpatient by the [place] [facility].  Called that facility to determine topical treatment; they changed the dressing and assessed the wound yesterday; pt is due for dressing changes Q M/W/F.  WOC will plan to change dressing tomorrow as described by their notes. Current dressing is in place without obvious bleeding at this time, there is a strong foul odor which is a chronic problem for the site, according to the EMR. Discussed plan of care with patient and she verbalized understanding.   Julien Girt MSN, RN, [place], [place], [place]

## 2017-06-02 ENCOUNTER — Telehealth: Payer: Self-pay

## 2017-06-02 ENCOUNTER — Other Ambulatory Visit: Payer: Self-pay

## 2017-06-02 DIAGNOSIS — Z170 Estrogen receptor positive status [ER+]: Secondary | ICD-10-CM

## 2017-06-02 DIAGNOSIS — R45851 Suicidal ideations: Secondary | ICD-10-CM

## 2017-06-02 DIAGNOSIS — C50412 Malignant neoplasm of upper-outer quadrant of left female breast: Secondary | ICD-10-CM

## 2017-06-02 LAB — COMPREHENSIVE METABOLIC PANEL
ALBUMIN: 1.8 g/dL — AB (ref 3.5–5.0)
ALT: 16 U/L (ref 14–54)
AST: 17 U/L (ref 15–41)
Alkaline Phosphatase: 96 U/L (ref 38–126)
Anion gap: 8 (ref 5–15)
BUN: 6 mg/dL (ref 6–20)
CO2: 25 mmol/L (ref 22–32)
Calcium: 8.7 mg/dL — ABNORMAL LOW (ref 8.9–10.3)
Chloride: 105 mmol/L (ref 101–111)
Creatinine, Ser: 0.58 mg/dL (ref 0.44–1.00)
GFR calc Af Amer: >60 mL/min (ref >60)
GFR calc non Af Amer: >60 mL/min (ref >60)
GLUCOSE: 91 mg/dL (ref 65–99)
POTASSIUM: 3.2 mmol/L — AB (ref 3.5–5.1)
SODIUM: 138 mmol/L (ref 135–145)
Total Bilirubin: 0.6 mg/dL (ref 0.3–1.2)
Total Protein: 5.7 g/dL — ABNORMAL LOW (ref 6.5–8.1)

## 2017-06-02 MED ORDER — OXYCODONE HCL ER 15 MG PO T12A
15.0000 mg | EXTENDED_RELEASE_TABLET | Freq: Two times a day (BID) | ORAL | Status: DC
  Administered 2017-06-02 – 2017-06-04 (×4): 15 mg via ORAL
  Filled 2017-06-02 (×5): qty 1

## 2017-06-02 MED ORDER — POTASSIUM CHLORIDE CRYS ER 20 MEQ PO TBCR
40.0000 meq | EXTENDED_RELEASE_TABLET | Freq: Once | ORAL | Status: AC
  Administered 2017-06-02: 40 meq via ORAL
  Filled 2017-06-02: qty 2

## 2017-06-02 MED ORDER — OXYCODONE HCL 5 MG PO TABS
10.0000 mg | ORAL_TABLET | ORAL | Status: DC | PRN
  Administered 2017-06-02 – 2017-06-04 (×9): 10 mg via ORAL
  Filled 2017-06-02 (×9): qty 2

## 2017-06-02 NOTE — Progress Notes (Signed)
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  Patient admitted taking about 58 Tylenol and entire bottle of Neurontin because of continued pain.  Assessment & Plan:   Active Problems:   Breast cancer of upper-outer quadrant of left female breast ([place])   Encounter for palliative care   Tylenol overdose   Suicidal ideation  Suicidal attempt/ideation: Admitted for Tylenol overdose.  Took about 48 hours of Tylenol.  Also took an entire bottle of Neurontin because of pain.  In the ER she said she felt suicidal due to her untreated pain. Psychiatry consult requested.  Plan is to discharge the patient to home with hospice after psych clearance.  Tylenol overdose: Started on NAC gtt  on admission.  Does not complain of any abdominal pain.  Liver enzymes  normal  Advanced left breast cancer: Diagnosed about 17 years ago.  Status post palliative chemotherapy and radiotherapy in 2016.  Breast cancer with metastases to lung and fungating mass through the skin.  This mass is necrotic, foul-smelling and continues to drain. Wound care following.  Palliative care following and the plan is to discharge her to home with hospice.     DVT prophylaxis: Heparin Code Status: DNR/DNI Family Communication: None present at the bedside Disposition Plan: Home with hospice   Consultants: Palliative care  Procedures:  Antimicrobials: None  Subjective: Patient seen and examined the bedside this afternoon.  Remains comfortable.  Complains of soakage of wound from the left breast  Objective: Vitals:   06/01/17 2200 06/01/17  2354 06/02/17 0621 06/02/17 1442  BP: 124/62 (!) 128/56 122/61 120/69  Pulse: 89 84 82 81  Resp: 18 17 18    Temp:  98.6 F (37 C) 98.5 F (36.9 C) 98.7 F (37.1 C)  TempSrc:  Oral Oral Oral  SpO2: 100% 99% 97% 100%  Weight:      Height:        Intake/Output Summary (Last 24 hours) at 06/02/2017 1722 Last data filed at 06/02/2017 3825 Gross per 24 hour  Intake 267.88 ml  Output -  Net 267.88 ml   Filed Weights   06/01/17 0800  Weight: 50.4 kg (111 lb 1.8 oz)    Examination:  General exam: Appears calm and comfortable ,Not in distress,average built Respiratory system: Bilateral equal air entry, normal vesicular breath sounds, no wheezes or crackles  Cardiovascular system: S1 & S2 heard, RRR. No JVD, murmurs, rubs, gallops or clicks. No pedal edema. Gastrointestinal system: Abdomen is nondistended, soft and nontender. No organomegaly or masses felt. Normal bowel sounds heard. Central nervous system: Alert and oriented. No focal neurological deficits. Extremities: No edema, no clubbing ,no cyanosis, distal peripheral pulses palpable. Skin: Fungating mass on the left breast covered by dressing, foul-smelling, drainage Psychiatry: Judgement and insight appear normal. Mood & affect appropriate.     Data Reviewed: I have personally reviewed following labs and imaging studies  CBC: Recent Labs  Lab 06/01/17 0137  WBC 12.3*  NEUTROABS 10.8*  HGB 7.2*  HCT 22.9*  MCV 86.4  PLT 053*   Basic Metabolic Panel: Recent Labs  Lab 06/01/17 0137 06/02/17 0218  NA 137 138  K 3.5 3.2*  CL 105 105  CO2 23 25  GLUCOSE 94 91  BUN 19 6  CREATININE 0.69 0.58  CALCIUM 8.3* 8.7*   GFR: Estimated Creatinine Clearance: 57.7 mL/min (by C-G formula based on SCr of 0.58 mg/dL). Liver Function Tests: Recent Labs  Lab 06/01/17 0137 06/02/17 0218  AST 41 17  ALT 23 16  ALKPHOS 122 96  BILITOT 0.5 0.6  PROT 6.6 5.7*  ALBUMIN 2.4* 1.8*   No results for input(s): LIPASE, AMYLASE  in the last 168 hours. Recent Labs  Lab 06/01/17 0137  AMMONIA 16   Coagulation Profile: Recent Labs  Lab 06/01/17 0137  INR 0.95   Cardiac Enzymes: No results for input(s): CKTOTAL, CKMB, CKMBINDEX, TROPONINI in the last 168 hours. BNP (last 3 results) No results for input(s): PROBNP in the last 8760 hours. HbA1C: No results for input(s): HGBA1C in the last 72 hours. CBG: Recent Labs  Lab 06/01/17 0140 06/01/17 0804  GLUCAP 90 64*   Lipid Profile: No results for input(s): CHOL, HDL, LDLCALC, TRIG, CHOLHDL, LDLDIRECT in the last 72 hours. Thyroid Function Tests: No results for input(s): TSH, T4TOTAL, FREET4, T3FREE, THYROIDAB in the last 72 hours. Anemia Panel: No results for input(s): VITAMINB12, FOLATE, FERRITIN, TIBC, IRON, RETICCTPCT in the last 72 hours. Sepsis Labs: No results for input(s): PROCALCITON, LATICACIDVEN in the last 168 hours.  Recent Results (from the past 240 hour(s))  MRSA PCR Screening     Status: None   Collection Time: 06/01/17  8:02 AM  Result Value Ref Range Status   MRSA by PCR NEGATIVE NEGATIVE Final    Comment:        The GeneXpert MRSA Assay (FDA approved for NASAL specimens only), is one component of a comprehensive MRSA colonization surveillance program. It is not intended to diagnose MRSA infection nor to guide or monitor treatment for MRSA infections.          Radiology Studies: No results found.      Scheduled Meds: . heparin  5,000 Units Subcutaneous Q8H  . oxyCODONE  15 mg Oral Q12H  . potassium chloride  40 mEq Oral Once   Continuous Infusions:   LOS: 1 day    Time spent: 25 minutes    Gurnoor Ursua Jodie Echevaria, MD [facility] Pager [phone]  If 7PM-7AM, please contact night-coverage [email or web address] Password [email or web address] 06/02/2017, 5:22 PM

## 2017-06-02 NOTE — Progress Notes (Signed)
PMT progress note  Ms Burandt is resting in bed, safety sitter is in the room, she is tolerating POs well now, she how ever has had a lot of pain, also has head ache, she says she was tearful because of her head ache, appears more labile this afternoon. No family at bedside.  BP 120/69   Pulse 81   Temp 98.7 F (37.1 C) (Oral)   Resp 18   Ht 5\' 2"  (1.575 m)   Wt 50.4 kg (111 lb 1.8 oz)   SpO2 100%   BMI 20.32 kg/m  Labs and imaging noted  Awake alert Resting in bed, tearful Regular Chest wall in dressing No edema, dry skin Abdomen soft S1 S2  A/P: 1. Agree with psych consult 2. Agree with home with hospice, if cleared by psych.  3. Increase PO PRN pain medication as well as PO long acting pain medication.  25 minutes spent [facility] palliative medicine team [phone].

## 2017-06-02 NOTE — Progress Notes (Addendum)
CM received consult: Was supposed to be set up with home hospice, but seems like this has not happened.  CM spoke with pt @ bedside and was told she thinks [facility] is the provider for her hospice  care. CM called [facility] and learned pt refused home hospice care in the past but is currently being followed by their palliative care program. CM explained given information to pt. CM ask pt if she's interested in  home hospice services  and pt stated yes. Choice offered, [facility] requested. Referral made with Audrea Muscat [facility] [phone]. Whitman Hero RN,BSN,CM

## 2017-06-02 NOTE — Telephone Encounter (Signed)
Received phone call from [facility] from [facility] - phone number [phone], regarding patient will be discharged from hospital and return home with Hospice.  Yvette requesting Dr [name] to be pt's primary with [facility].  Returned [employer] call and left message with Delsa Sale that Dr [name] can be pt's primary with Hospice as requested.

## 2017-06-02 NOTE — Progress Notes (Signed)
[facility] Liaison: RN visit  Notified by Whitman Hero, [facility] of patient request for [facility] services at home after discharge. Chart and patient information under review by [facility] physician. Hospice eligibility pending at this time.  Writer spoke with patient at bedside to initiate education related to hospice philosophy, services and team approach to care. verbalized understanding of information given. Per discussion, plan is for discharge to home in next 1-2 days by [facility] or private vehicle.   Please send signed and completed DNR form home with patient/family. Patient will need prescriptions for discharge comfort medications.  DME needs have been discussed, patient currently has the following equipment in the home: none. Patient/family requests the following DME for delivery to the home: none.   [facility] aware of the above. Please notify [facility] when patient is ready to leave the unit at discharge. (Call [phone] or [phone] after 5pm.) [facility] information and contact numbers given to at time of visit. Above information shared with CMRN.  Please call with any hospice related questions.  Thank you for this referral.  Farrel Gordon, RN, [facility] Liaison [phone] ? [facility] liaisons are now on [place].

## 2017-06-02 NOTE — Consult Note (Addendum)
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 She is followed by the outpatient wound care center and it is due to be changed today. (M/W/F)  Approached pt earlier this am and arranged for dressing change at 10:15, after additional medication was scheduled to be given, and patient agreed to this plan.  Upon arriving at that time with the supplies, patient refused for dressing change to be performed and states it can be done tomorrow.  Reviewed importance of changing on a schedule to avoid dressing from drying, sticking and and bleeding, since this has occurred in the past, and informed patient she will develop increased odor and drainage the longer she waits.  She continued to refuse a dressing change today.  Will return at 10:00 tomorrow, since pt agrees to this plan. Julien Girt MSN, RN, [place], [place], [place]

## 2017-06-02 NOTE — Progress Notes (Signed)
Pt arrived to unit and ambulated to bed. Identified appropriately and assessed. Skin intact, large bandage around chest to cover wound to left breast. Pt oriented to unit, sitter present in room. Pt c/o pain 10/10 to left breast, given pain medicine. Will continue to monitor.

## 2017-06-03 ENCOUNTER — Other Ambulatory Visit: Payer: Self-pay

## 2017-06-03 ENCOUNTER — Encounter (HOSPITAL_COMMUNITY): Payer: Self-pay | Admitting: General Practice

## 2017-06-03 DIAGNOSIS — G4700 Insomnia, unspecified: Secondary | ICD-10-CM

## 2017-06-03 DIAGNOSIS — T391X1A Poisoning by 4-Aminophenol derivatives, accidental (unintentional), initial encounter: Secondary | ICD-10-CM

## 2017-06-03 DIAGNOSIS — R110 Nausea: Secondary | ICD-10-CM

## 2017-06-03 DIAGNOSIS — F331 Major depressive disorder, recurrent, moderate: Secondary | ICD-10-CM

## 2017-06-03 DIAGNOSIS — R450 Nervousness: Secondary | ICD-10-CM

## 2017-06-03 DIAGNOSIS — R51 Headache: Secondary | ICD-10-CM

## 2017-06-03 DIAGNOSIS — T1491XA Suicide attempt, initial encounter: Secondary | ICD-10-CM

## 2017-06-03 DIAGNOSIS — F419 Anxiety disorder, unspecified: Secondary | ICD-10-CM

## 2017-06-03 DIAGNOSIS — Z560 Unemployment, unspecified: Secondary | ICD-10-CM

## 2017-06-03 LAB — COMPREHENSIVE METABOLIC PANEL
ALT: 16 U/L (ref 14–54)
ANION GAP: 8 (ref 5–15)
AST: 21 U/L (ref 15–41)
Albumin: 2 g/dL — ABNORMAL LOW (ref 3.5–5.0)
Alkaline Phosphatase: 103 U/L (ref 38–126)
BUN: 5 mg/dL — ABNORMAL LOW (ref 6–20)
CALCIUM: 8.7 mg/dL — AB (ref 8.9–10.3)
CHLORIDE: 102 mmol/L (ref 101–111)
CO2: 25 mmol/L (ref 22–32)
CREATININE: 0.57 mg/dL (ref 0.44–1.00)
GFR calc non Af Amer: >60 mL/min (ref >60)
Glucose, Bld: 99 mg/dL (ref 65–99)
Potassium: 4 mmol/L (ref 3.5–5.1)
SODIUM: 135 mmol/L (ref 135–145)
Total Bilirubin: 0.5 mg/dL (ref 0.3–1.2)
Total Protein: 5.8 g/dL — ABNORMAL LOW (ref 6.5–8.1)

## 2017-06-03 LAB — CBC WITH DIFFERENTIAL/PLATELET
BASOS PCT: 0 %
Basophils Absolute: 0 10*3/uL (ref 0.0–0.1)
EOS ABS: 0.2 10*3/uL (ref 0.0–0.7)
EOS PCT: 2 %
HCT: 22.2 % — ABNORMAL LOW (ref 36.0–46.0)
Hemoglobin: 6.7 g/dL — CL (ref 12.0–15.0)
LYMPHS ABS: 0.8 10*3/uL (ref 0.7–4.0)
Lymphocytes Relative: 8 %
MCH: 26 pg (ref 26.0–34.0)
MCHC: 30.2 g/dL (ref 30.0–36.0)
MCV: 86 fL (ref 78.0–100.0)
MONO ABS: 0.4 10*3/uL (ref 0.1–1.0)
Monocytes Relative: 4 %
NEUTROS ABS: 8.3 10*3/uL — AB (ref 1.7–7.7)
Neutrophils Relative %: 86 %
PLATELETS: 496 10*3/uL — AB (ref 150–400)
RBC: 2.58 MIL/uL — ABNORMAL LOW (ref 3.87–5.11)
RDW: 19.2 % — ABNORMAL HIGH (ref 11.5–15.5)
WBC: 9.7 10*3/uL (ref 4.0–10.5)

## 2017-06-03 LAB — PREPARE RBC (CROSSMATCH)

## 2017-06-03 MED ORDER — SODIUM CHLORIDE 0.9 % IV SOLN: Freq: Once | INTRAVENOUS | Status: DC

## 2017-06-03 MED ORDER — ONDANSETRON HCL 4 MG/2ML IJ SOLN
4.0000 mg | Freq: Four times a day (QID) | INTRAMUSCULAR | Status: AC
  Administered 2017-06-03 – 2017-06-04 (×4): 4 mg via INTRAVENOUS
  Filled 2017-06-03 (×4): qty 2

## 2017-06-03 NOTE — Progress Notes (Signed)
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  Patient admitted taking about 58 Tylenol and entire bottle of Neurontin because of continued pain.  Assessment & Plan:   Principal Problem:   MDD (major depressive disorder), recurrent episode, moderate (HCC) Active Problems:   Breast cancer of upper-outer quadrant of left female breast ([place])   Encounter for palliative care   Tylenol overdose   Suicidal ideation  Suicidal attempt/ideation: Admitted for Tylenol overdose.  Took about 48 hours of Tylenol.  Also took an entire bottle of Neurontin because of pain.  In the ER she said she felt suicidal due to her untreated pain. Psychiatry evaluated and cleared for discharge.  Started on Remeron for depression.  Plan is to discharge the patient to home with hospice .  Palliative care following  Anemia: Hb is 6.7.  We plan for transfusion of 2 units of PRBC but patient denies.  We will continue to monitor H&H and transfuse if patient agrees.  Tylenol overdose: Started on NAC gtt  on admission.  Does not complain of any abdominal pain.  Liver enzymes  normal  Advanced left breast cancer: Diagnosed about 17 years ago.  Status post palliative chemotherapy and radiotherapy in 2016.  Breast cancer with metastases to lung and fungating mass through the skin.  This mass is necrotic, foul-smelling and continues to drain. Wound care following.  Plan  is to discharge her to home with hospice.Palliative care working on that.     DVT prophylaxis: Heparin Code Status: DNR/DNI Family Communication: None present at the  bedside Disposition Plan: Home with hospice as soon as possible   Consultants: Palliative care  Procedures:  Antimicrobials: None  Subjective: Patient seen and examined the bedside.  Remains comfortable.  Denies blood transfusion and says she will think about it.  Objective: Vitals:   06/02/17 0621 06/02/17 1442 06/02/17 2103 06/03/17 0518  BP: 122/61 120/69 124/77 120/64  Pulse: 82 81 83 84  Resp: 18  16 16   Temp: 98.5 F (36.9 C) 98.7 F (37.1 C) 99 F (37.2 C) 98.7 F (37.1 C)  TempSrc: Oral Oral Oral Oral  SpO2: 97% 100% 99% 100%  Weight:      Height:        Intake/Output Summary (Last 24 hours) at 06/03/2017 1456 Last data filed at 06/03/2017 1000 Gross per 24 hour  Intake -  Output 300 ml  Net -300 ml   Filed Weights   06/01/17 0800  Weight: 50.4 kg (111 lb 1.8 oz)    Examination:  General exam: Appears calm and comfortable ,Not in distress,average built Respiratory system: Bilateral equal air entry, normal vesicular breath sounds, no wheezes or crackles  Cardiovascular system: S1 & S2 heard, RRR. No JVD, murmurs, rubs, gallops or clicks. No pedal edema. Gastrointestinal system: Abdomen is nondistended, soft and nontender. No organomegaly or masses felt. Normal bowel sounds heard. Central nervous system: Alert and oriented. No focal neurological deficits. Extremities: No edema, no clubbing ,no cyanosis, distal peripheral pulses palpable. Skin: Fungating mass on the left breast covered by dressing, foul-smelling, drainage Psychiatry: Judgement and insight appear normal. Mood & affect appropriate.     Data Reviewed: I have personally  reviewed following labs and imaging studies  CBC: Recent Labs  Lab 06/01/17 0137 06/03/17 0400  WBC 12.3* 9.7  NEUTROABS 10.8* 8.3*  HGB 7.2* 6.7*  HCT 22.9* 22.2*  MCV 86.4 86.0  PLT 513* 706*   Basic Metabolic Panel: Recent Labs  Lab 06/01/17 0137 06/02/17 0218 06/03/17 0400  NA 137 138 135  K 3.5 3.2* 4.0   CL 105 105 102  CO2 23 25 25   GLUCOSE 94 91 99  BUN 19 6 5*  CREATININE 0.69 0.58 0.57  CALCIUM 8.3* 8.7* 8.7*   GFR: Estimated Creatinine Clearance: 57.7 mL/min (by C-G formula based on SCr of 0.57 mg/dL). Liver Function Tests: Recent Labs  Lab 06/01/17 0137 06/02/17 0218 06/03/17 0400  AST 41 17 21  ALT 23 16 16   ALKPHOS 122 96 103  BILITOT 0.5 0.6 0.5  PROT 6.6 5.7* 5.8*  ALBUMIN 2.4* 1.8* 2.0*   No results for input(s): LIPASE, AMYLASE in the last 168 hours. Recent Labs  Lab 06/01/17 0137  AMMONIA 16   Coagulation Profile: Recent Labs  Lab 06/01/17 0137  INR 0.95   Cardiac Enzymes: No results for input(s): CKTOTAL, CKMB, CKMBINDEX, TROPONINI in the last 168 hours. BNP (last 3 results) No results for input(s): PROBNP in the last 8760 hours. HbA1C: No results for input(s): HGBA1C in the last 72 hours. CBG: Recent Labs  Lab 06/01/17 0140 06/01/17 0804  GLUCAP 90 64*   Lipid Profile: No results for input(s): CHOL, HDL, LDLCALC, TRIG, CHOLHDL, LDLDIRECT in the last 72 hours. Thyroid Function Tests: No results for input(s): TSH, T4TOTAL, FREET4, T3FREE, THYROIDAB in the last 72 hours. Anemia Panel: No results for input(s): VITAMINB12, FOLATE, FERRITIN, TIBC, IRON, RETICCTPCT in the last 72 hours. Sepsis Labs: No results for input(s): PROCALCITON, LATICACIDVEN in the last 168 hours.  Recent Results (from the past 240 hour(s))  MRSA PCR Screening     Status: None   Collection Time: 06/01/17  8:02 AM  Result Value Ref Range Status   MRSA by PCR NEGATIVE NEGATIVE Final    Comment:        The GeneXpert MRSA Assay (FDA approved for NASAL specimens only), is one component of a comprehensive MRSA colonization surveillance program. It is not intended to diagnose MRSA infection nor to guide or monitor treatment for MRSA infections.          Radiology Studies: No results found.      Scheduled Meds: . heparin  5,000 Units Subcutaneous Q8H  .  oxyCODONE  15 mg Oral Q12H   Continuous Infusions: . sodium chloride       LOS: 2 days    Time spent: 25 minutes    Jaelin Fackler Jodie Echevaria, MD [facility] Pager [phone]  If 7PM-7AM, please contact night-coverage [email or web address] Password [email or web address] 06/03/2017, 2:56 PM

## 2017-06-03 NOTE — Progress Notes (Signed)
Pt. Refusing to sigh blood consent at this time pt stated "I get my blood from [place]" and saying she does not want blood from [place]. MD notified

## 2017-06-03 NOTE — Progress Notes (Signed)
PMT progress note  [name] is resting in bed, safety sitter is in the room, she is tolerating POs well now, how ever appetite is poor now, she ate about 10-20% of her lunch  No family at bedside and patient declines to talk about them.  She complains of nausea Still tearful at times Refused blood transfusion earlier today Seen by Psych, appreciate their input.   BP 120/64 (BP Location: Right Arm)   Pulse 84   Temp 98.7 F (37.1 C) (Oral)   Resp 16   Ht 5\' 2"  (1.575 m)   Wt 50.4 kg (111 lb 1.8 oz)   SpO2 100%   BMI 20.32 kg/m  Labs and imaging noted  Awake alert Resting in bed, tearful Regular Chest wall in dressing No edema, dry skin Abdomen soft S1 S2  A/P: 1. Appreciate psych consult and care management input.  2. Appreciate [facility] support, home with hospice  3. continue PO PRN pain medication as well as PO long acting pain medication. 4. Add low dose scheduled Zofran for nausea and monitor.   25 minutes spent [place] [facility] palliative medicine team [phone].

## 2017-06-03 NOTE — Consult Note (Addendum)
[facility] Face-to-Face Psychiatry Consult   Reason for Consult:  Overdose Referring Physician:  Dr. Tawanna Solo Patient Identification: Isel Skufca MRN:  [id number] Principal Diagnosis: MDD (major depressive disorder), recurrent episode, moderate ([place]) Diagnosis:   Patient Active Problem List   Diagnosis Date Noted  . Suicidal ideation [R45.851] 06/02/2017  . Tylenol overdose [T39.1X1A] 06/01/2017  . Breast mass [N63.0]   . Acute blood loss anemia [D62]   . Hemorrhage from wound [R58]   . Ascites [R18.8]   . Evaluation by psychiatric service required [Z00.8] 04/22/2017  . AKI (acute kidney injury) ([place]) [N17.9] 03/26/2017  . Leg swelling [M79.89] 03/26/2017  . Diarrhea [R19.7] 03/26/2017  . Leukocytosis [D72.829] 03/26/2017  . Breast wound [S21.009A] 03/18/2017  . Blood loss anemia [D50.0] 01/18/2017  . Port catheter in place [facility] 12/02/2016  . Necrotizing soft tissue infection [M79.89] 11/21/2016  . Wound infection [T14.8XXA, L08.9] 10/28/2016  . Cellulitis of left breast [N61.0] 10/28/2016  . Encounter for palliative care [Z51.5]   . Goals of care, counseling/discussion [Z71.89]   . 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 Malnutrition of moderate degree [E44.0] 09/25/2016  . Breast cancer of upper-outer quadrant of left female breast ([place]) [C50.412] 10/22/2015  . THYROID NODULE, RIGHT [E04.1] 05/31/2007  . Depressed [F32.9] 05/31/2007  . GERD [K21.9] 05/31/2007  . HOT FLASHES [N95.1] 05/31/2007  . HEADACHE [R51] 05/31/2007    Total Time spent with patient: 1 hour  Subjective:   Dniyah Grant is a 62 y.o. female patient admitted with Tylenol overdose.  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 She took an entire bottle of Neurontin and 58 tablets of Tylenol over the course of 1-2 days for poorly treated pain. She reports running out of her  OxyContin. She reported that she was suicidal in the ED due to pain. She required NAC drip.   Of note, she was last seen by the psychiatry consult service on 11/29 for capacity evaluation to refuse comfort care. She demonstrated capacity. She was also seen on 10/30 for depression and intermittent SI in the setting of pain related to her medical condition. She was started on Remeron 15 mg qhs for sleep.   On interview, Ms. Saephanh reports that she ran out of Oxycodone for pain management and was using multiple OTC pain medications. She did not realize what harm she was causing and "learned her lesson." She does report depression and chronic, intermittent SI due to psychosocial stressors as well as worries about her current medical condition and pain. She reports worsening SI due to uncontrolled pain prior to admission. She reports that her grandchildren are her reason to live. She feels better today and has been able to get out of bed since her pain has significantly improved. She is future oriented and is able to state her goals for when she returns home. She would like to accomplish small goals so that she is not overwhelmed. She additionally endorses poor sleep due to pain and worry. She reports an improvement in her sleep since Wednesday. She denies HI or AVH.   Past Psychiatric History: Depression   Risk to Self: Is patient at risk for suicide?: Yes Risk to Others:  None. Denies HI. Prior Inpatient Therapy:  Denies  Prior Outpatient Therapy:  She was treated with Remeron for sleep in October while hospitalized but it was discontinued.   Past Medical History:  Past Medical History:  Diagnosis  Date  . Breast cancer ([place])    METS TO LUNGS  . Sinusitis   . Tylenol overdose 05/2017    Past Surgical History:  Procedure Laterality Date  . BIOPSY BREAST    . IR FLUORO GUIDE CV LINE RIGHT  11/06/2016  . IR US GUIDE VASC ACCESS RIGHT  11/06/2016   Family History:  Family History  Family history  unknown: Yes   Family Psychiatric  History: Denies  Social History:  Social History   Substance and Sexual Activity  Alcohol Use No     Social History   Substance and Sexual Activity  Drug Use No    Social History   Socioeconomic History  . Marital status: Single    Spouse name: None  . Number of children: None  . Years of education: None  . Highest education level: None  Social Needs  . Financial resource strain: None  . Food insecurity - worry: None  . Food insecurity - inability: None  . Transportation needs - medical: None  . Transportation needs - non-medical: None  Occupational History  . Occupation: unemployed  Tobacco Use  . Smoking status: Never Smoker  . Smokeless tobacco: Never Used  Substance and Sexual Activity  . Alcohol use: No  . Drug use: No  . Sexual activity: None  Other Topics Concern  . None  Social History Narrative  . None   Additional Social History: She lives at home with family. She denies substance use.    Allergies:   Allergies  Allergen Reactions  . Aspirin Palpitations    Labs:  Results for orders placed or performed during the hospital encounter of 06/01/17 (from the past 48 hour(s))  Acetaminophen level     Status: Abnormal   Collection Time: 06/01/17  1:39 PM  Result Value Ref Range   Acetaminophen (Tylenol), Serum <10 (L) 10 - 30 ug/mL    Comment:        THERAPEUTIC CONCENTRATIONS VARY SIGNIFICANTLY. A RANGE OF 10-30 ug/mL MAY BE AN EFFECTIVE CONCENTRATION FOR MANY PATIENTS. HOWEVER, SOME ARE BEST TREATED AT CONCENTRATIONS OUTSIDE THIS RANGE. ACETAMINOPHEN CONCENTRATIONS >150 ug/mL AT 4 HOURS AFTER INGESTION AND >50 ug/mL AT 12 HOURS AFTER INGESTION ARE OFTEN ASSOCIATED WITH TOXIC REACTIONS.   Comprehensive metabolic panel     Status: Abnormal   Collection Time: 06/02/17  2:18 AM  Result Value Ref Range   Sodium 138 135 - 145 mmol/L   Potassium 3.2 (L) 3.5 - 5.1 mmol/L   Chloride 105 101 - 111 mmol/L   CO2  25 22 - 32 mmol/L   Glucose, Bld 91 65 - 99 mg/dL   BUN 6 6 - 20 mg/dL   Creatinine, Ser 0.58 0.44 - 1.00 mg/dL   Calcium 8.7 (L) 8.9 - 10.3 mg/dL   Total Protein 5.7 (L) 6.5 - 8.1 g/dL   Albumin 1.8 (L) 3.5 - 5.0 g/dL   AST 17 15 - 41 U/L   ALT 16 14 - 54 U/L   Alkaline Phosphatase 96 38 - 126 U/L   Total Bilirubin 0.6 0.3 - 1.2 mg/dL   GFR calc non Af Amer >60 >60 mL/min   GFR calc Af Amer >60 >60 mL/min    Comment: (NOTE) The eGFR has been calculated using the CKD EPI equation. This calculation has not been validated in all clinical situations. eGFR's persistently <60 mL/min signify possible Chronic Kidney Disease.    Anion gap 8 5 - 15  CBC with Differential/Platelet  Status: Abnormal   Collection Time: 06/03/17  4:00 AM  Result Value Ref Range   WBC 9.7 4.0 - 10.5 K/uL   RBC 2.58 (L) 3.87 - 5.11 MIL/uL   Hemoglobin 6.7 (LL) 12.0 - 15.0 g/dL    Comment: REPEATED TO VERIFY CRITICAL RESULT CALLED TO, READ BACK BY AND VERIFIED WITH: D. BASUNU,RN 0448 06/03/2017 T. TYSOR    HCT 22.2 (L) 36.0 - 46.0 %   MCV 86.0 78.0 - 100.0 fL   MCH 26.0 26.0 - 34.0 pg   MCHC 30.2 30.0 - 36.0 g/dL   RDW 19.2 (H) 11.5 - 15.5 %   Platelets 496 (H) 150 - 400 K/uL   Neutrophils Relative % 86 %   Lymphocytes Relative 8 %   Monocytes Relative 4 %   Eosinophils Relative 2 %   Basophils Relative 0 %   Neutro Abs 8.3 (H) 1.7 - 7.7 K/uL   Lymphs Abs 0.8 0.7 - 4.0 K/uL   Monocytes Absolute 0.4 0.1 - 1.0 K/uL   Eosinophils Absolute 0.2 0.0 - 0.7 K/uL   Basophils Absolute 0.0 0.0 - 0.1 K/uL   RBC Morphology POLYCHROMASIA PRESENT     Comment: TARGET CELLS  Comprehensive metabolic panel     Status: Abnormal   Collection Time: 06/03/17  4:00 AM  Result Value Ref Range   Sodium 135 135 - 145 mmol/L   Potassium 4.0 3.5 - 5.1 mmol/L    Comment: DELTA CHECK NOTED   Chloride 102 101 - 111 mmol/L   CO2 25 22 - 32 mmol/L   Glucose, Bld 99 65 - 99 mg/dL   BUN 5 (L) 6 - 20 mg/dL   Creatinine, Ser  0.57 0.44 - 1.00 mg/dL   Calcium 8.7 (L) 8.9 - 10.3 mg/dL   Total Protein 5.8 (L) 6.5 - 8.1 g/dL   Albumin 2.0 (L) 3.5 - 5.0 g/dL   AST 21 15 - 41 U/L   ALT 16 14 - 54 U/L   Alkaline Phosphatase 103 38 - 126 U/L   Total Bilirubin 0.5 0.3 - 1.2 mg/dL   GFR calc non Af Amer >60 >60 mL/min   GFR calc Af Amer >60 >60 mL/min    Comment: (NOTE) The eGFR has been calculated using the CKD EPI equation. This calculation has not been validated in all clinical situations. eGFR's persistently <60 mL/min signify possible Chronic Kidney Disease.    Anion gap 8 5 - 15  Type and screen [place]     Status: None (Preliminary result)   Collection Time: 06/03/17 10:20 AM  Result Value Ref Range   ABO/RH(D) A POS    Antibody Screen PENDING    Sample Expiration 06/06/2017   Prepare RBC     Status: None   Collection Time: 06/03/17 10:20 AM  Result Value Ref Range   Order Confirmation ORDER PROCESSED BY BLOOD BANK     Current Facility-Administered Medications  Medication Dose Route Frequency Provider Last Rate Last Dose  . 0.9 %  sodium chloride infusion   Intravenous Once Jodie Echevaria, Amrit, MD      . heparin injection 5,000 Units  5,000 Units Subcutaneous Q8H Corey Harold, NP   5,000 Units at 06/01/17 2157  . morphine 2 MG/ML injection 2-4 mg  2-4 mg Intravenous Q4H PRN Rigoberto Noel, MD   4 mg at 06/03/17 1154  . ondansetron (ZOFRAN) injection 4 mg  4 mg Intravenous Q6H PRN Anders Simmonds, MD   4  mg at 06/03/17 0745  . oxyCODONE (Oxy IR/ROXICODONE) immediate release tablet 10 mg  10 mg Oral Q4H PRN Loistine Chance, MD   10 mg at 06/03/17 1140  . oxyCODONE (OXYCONTIN) 12 hr tablet 15 mg  15 mg Oral Q12H Loistine Chance, MD   15 mg at 06/03/17 0932   Facility-Administered Medications Ordered in Other Encounters  Medication Dose Route Frequency Provider Last Rate Last Dose  . sodium chloride flush (NS) 0.9 % injection 10 mL  10 mL Intracatheter PRN Nicholas Lose, MD   10 mL at  12/25/16 1702    Musculoskeletal: Strength & Muscle Tone: within normal limits Gait & Station: UTA since patient was sitting in a chair.  Patient leans: N/A  Psychiatric Specialty Exam: Physical Exam  Nursing note and vitals reviewed. Constitutional: She is oriented to person, place, and time. She appears well-developed.  thin  HENT:  Head: Normocephalic and atraumatic.  Neck: Normal range of motion.  Respiratory: Effort normal.  Musculoskeletal: Normal range of motion.  Neurological: She is alert and oriented to person, place, and time.  Skin: No rash noted.  Psychiatric: Her speech is normal and behavior is normal. Thought content normal. She exhibits a depressed mood.    Review of Systems  Constitutional: Negative for chills and fever.  Gastrointestinal: Positive for nausea. Negative for abdominal pain, constipation, diarrhea and vomiting.  Neurological: Positive for headaches.  Psychiatric/Behavioral: Positive for depression and suicidal ideas. Negative for hallucinations and substance abuse. The patient is nervous/anxious and has insomnia.     Blood pressure 120/64, pulse 84, temperature 98.7 F (37.1 C), temperature source Oral, resp. rate 16, height _0  (1.575 m), weight 50.4 kg (111 lb 1.8 oz), SpO2 100 %.Body mass index is 20.32 kg/m.  General Appearance: Well Groomed, middle aged, thin, African American female with short hair, wearing a hospital gown and sitting in a chair. NAD.   Eye Contact:  Good  Speech:  Clear and Coherent and Normal Rate  Volume:  Normal  Mood:  Depressed and briefly tearful.   Affect:  Appropriate and Congruent  Thought Process:  Goal Directed and Linear  Orientation:  Full (Time, Place, and Person)  Thought Content:  Logical  Suicidal Thoughts:  No but reports chronic, intermittent SI.  Homicidal Thoughts:  No  Memory:  Immediate;   Good Recent;   Good Remote;   Good  Judgement:  Fair  Insight:  Fair  Psychomotor Activity:  Normal   Concentration:  Concentration: Good and Attention Span: Good  Recall:  [employer] of Knowledge:  Fair  Language:  Fair  Akathisia:  No  Handed:  Right  AIMS (if indicated):   N/A  Assets:  Desire for Improvement Housing Social Support  ADL's:  Intact  Cognition:  She appears to have some difficulty verbally expressing herself and/or difficulty processing information.   Sleep:   Poor   Assessment: Desaree Downen is a 62 y.o. female who was admitted for overdose in the setting of poorly managed pain. She reports chronic, intermittent SI due to multiple psychosocial stressors as well as medical illness. She reports taking Tylenol and Gabapentin for treatment of pain with poor effect and it appearsl that her chronic SI worsened due to pain. She reports feeling better today and is future oriented. She does report depressive symptoms for which an antidepressant may be beneficial. Also discussed inpatient psychiatric treatment with patient but she is able to safety plan and reports that she feels safe returning home  and agreeable to medication management of psychiatric condition.   Treatment Plan Summary: -Start Remeron 15 mg qhs for depression, anxiety and insomnia.  -Please have unit SW provide patient with resources for psychiatrists and therapists. She may also continue medication management with her PCP.  -Patient is psychiatrically cleared. Psychiatry will sign off patient at this time. Please consult psychiatry again as needed.   Disposition: No evidence of imminent risk to self or others at present.    Faythe Dingwall, DO 06/03/2017 12:11 PM

## 2017-06-03 NOTE — Consult Note (Addendum)
[place] Nurse wound follow-up consult note Reason for Consult:Left breast mass dressing change. Fara Olden, WTA-C assisted with the procedure. Pt was medicated for pain and tolerated with minimal amt discomfort.  Measurement: Approx 20X18cm, tumor extends significantly above the skin level. Drainage (amount, consistency, odor) Large amt tan drainage, strong odor from dressings consistent with necrosis. 70% yellow, 10% red, 20% black Dressing procedure/placement/frequency: Dressings taken down slowly, layer by layer, moistening with NS to minimize pain and bleeding. Scant amt blood seeping from patchy area of the wound, but no active bleeding occurred. Applied dressing as was similarly used by the outpatient wound care center; Aquacel, then Drawtex, then kerlex, ABD pads, and coban to secure in place. Applied Ace wrap to provide compression. Pt plans to follow-up with the outpatient wound care center on Monday if she is discharged at that time.  If she is still in the hospital, [facility] nurse will change on that day.  She also plans to be followed by Hospice after discharge for dressing changes, according to the EMR. Julien Girt MSN, RN, [place], [place], [place]

## 2017-06-03 NOTE — Progress Notes (Signed)
Call received from lab about patient's HGB 6.7 this am from 7.2 on 06/01/2017, Dr. Tawanna Solo was paged, call back received, Dr Tawanna Solo, stated he will let day shift to decide, patient is asymptomatic. Will continue to monitor.

## 2017-06-03 NOTE — Care Management Note (Signed)
Case Management Note  Patient Details  Name: Taleia Sadowski MRN: [id number] Date of Birth: 04/05/1956  Subjective/Objective:     Admitted with Tylenol overdose, hx of metastatic adenocarcinoma of the breast status post palliative chemo and radiation. PTA was being followed by [facility] palliative care program. Per Palliative Care liaison, Enid Derry [phone]) pt has 4  sons and a sister, however, pt will not give out information nor telephone numbers regarding family. Pt is very private and doesn't want family to know her condition.  Pt states lives alone.          Action/Plan: Transition to home with hospice care @ d/c.  Expected Discharge Date:                  Expected Discharge Plan:  Home w Hospice Care(Lives alone.)  In-House Referral:     Discharge planning Services  CM Consult  Post Acute Care Choice:    Choice offered to:  Patient  DME Arranged:    DME Agency:     HH Arranged:   [place] Agency:  [facility]  Status of Service:  Completed, signed off  If discussed at [place] of Stay Meetings, dates discussed:    Additional Comments:  Sharin Mons, RN 06/03/2017, 2:46 PM

## 2017-06-03 NOTE — Progress Notes (Signed)
Pt refusing blood transfusion at this time.

## 2017-06-04 LAB — CBC WITH DIFFERENTIAL/PLATELET
Basophils Absolute: 0 10*3/uL (ref 0.0–0.1)
Basophils Relative: 0 %
Eosinophils Absolute: 0.2 10*3/uL (ref 0.0–0.7)
Eosinophils Relative: 2 %
HEMATOCRIT: 22.6 % — AB (ref 36.0–46.0)
HEMOGLOBIN: 6.6 g/dL — AB (ref 12.0–15.0)
LYMPHS PCT: 10 %
Lymphs Abs: 1 10*3/uL (ref 0.7–4.0)
MCH: 25.4 pg — AB (ref 26.0–34.0)
MCHC: 29.2 g/dL — AB (ref 30.0–36.0)
MCV: 86.9 fL (ref 78.0–100.0)
MONOS PCT: 4 %
Monocytes Absolute: 0.4 10*3/uL (ref 0.1–1.0)
NEUTROS ABS: 8.1 10*3/uL — AB (ref 1.7–7.7)
NEUTROS PCT: 84 %
Platelets: 508 10*3/uL — ABNORMAL HIGH (ref 150–400)
RBC: 2.6 MIL/uL — AB (ref 3.87–5.11)
RDW: 19.3 % — ABNORMAL HIGH (ref 11.5–15.5)
WBC: 9.6 10*3/uL (ref 4.0–10.5)

## 2017-06-04 MED ORDER — MIRTAZAPINE 15 MG PO TABS: 15.0000 mg | ORAL_TABLET | Freq: Every day | ORAL | Status: DC

## 2017-06-04 MED ORDER — MIRTAZAPINE 15 MG PO TABS: 15.0000 mg | ORAL_TABLET | Freq: Every day | ORAL | 0 refills | Status: DC

## 2017-06-04 MED ORDER — OXYCODONE HCL 10 MG PO TABS: 10.0000 mg | ORAL_TABLET | Freq: Four times a day (QID) | ORAL | 0 refills | Status: DC | PRN

## 2017-06-04 NOTE — Progress Notes (Addendum)
CSW provided patient with taxi voucher.  Patient's IVC is rescinded per MD. CSW provided psych follow up providers info in patient's AVS.   CSW signing off.  Percell Locus Zion Lint LCSWA [phone]

## 2017-06-04 NOTE — Discharge Summary (Signed)
Physician Discharge Summary  Ann Oliver PFX:[id number] DOB: 1956/03/19 DOA: 06/01/2017  PCP: [facility]  Admit date: 06/01/2017 Discharge date: 06/04/2017  Admitted From: Home Disposition:  Home with Hospice  Recommendations for Outpatient Follow-up:  1. Follow up with Hospice services. 2.Follow up with psychiatry as an outpatient.   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  Patient admitted taking about 58 Tylenol and entire bottle of Neurontin because of continued pain. She was started on N-acetylcysteine drip on admission.  Interestingly her liver enzymes remain stable.  Patient overall status remained stable except for anemia.Her hb dropped to the range of 6.  2 units of PRBC were ordered but patient persistently denies blood transfusion. Patient was suspected to have suicidal ideations.  Psychiatry was consulted.  Patient was also seen by palliative team due to her terminal breast cancer.  She has been set up with home hospice.  Psychiatry cleared her for discharge.  She is being discharged today.  Following problems were addressed during hospitalization:   Suicidal attempt/ideation: Admitted for Tylenol overdose.  Took about 48 hours of Tylenol.  Also took an entire bottle of Neurontin because of pain.  Suicidal ideations suspected .Psychiatry evaluated and cleared for discharge.  Started on Remeron for depression.  Plan is to discharge the patient to home with hospice .  Palliative care was  following.  Anemia: Hb is 6.7.  We plan for transfusion of 2 units of PRBC but patient denies.    Patient persistently  denies blood transfusion despite repeated conversation.  Tylenol overdose: Started on NAC gtt  on admission.  Does not complain of any abdominal pain.  Liver enzymes  normal  Advanced left breast cancer: Diagnosed about 17 years ago.  Status post palliative chemotherapy and radiotherapy in 2016.  Breast cancer with metastases to lung and fungating mass through the skin.  This mass is necrotic, foul-smelling and continues to drain. Wound care was following here.     Discharge Diagnoses:  Principal Problem:   MDD (major depressive disorder), recurrent episode, moderate ([place]) Active Problems:   Breast cancer of upper-outer quadrant of left female breast ([place])   Encounter for palliative care   Tylenol overdose   Suicidal ideation    Discharge Instructions  Discharge Instructions    Diet - low sodium heart healthy   Complete by:  As directed    Discharge instructions   Complete by:  As directed    1) Take prescribed medications as instructed. 2) Follow up with Home Hospice.   Increase activity slowly   Complete by:  As directed      Allergies as of 06/04/2017      Reactions   Aspirin Palpitations      Medication List    STOP taking these medications   oxyCODONE 10 mg 12 hr tablet Commonly known as:  OXYCONTIN Replaced by:  Oxycodone HCl 10 MG Tabs     TAKE these medications   gabapentin 600 MG tablet Commonly known as:  NEURONTIN Take 1 tablet (600 mg total) by mouth 3 (three) times daily.   loperamide 2 MG capsule Commonly known as:  IMODIUM Take 1 capsule (2 mg total) by mouth as needed for diarrhea or loose stools.  mirtazapine 15 MG tablet Commonly known as:  REMERON Take 1 tablet (15 mg total) by mouth at bedtime.   multivitamin with minerals Tabs tablet Take 1 tablet by mouth daily.   ondansetron 8 MG tablet Commonly known as:  ZOFRAN Take 1 tablet (8 mg total) by mouth every 8 (eight) hours as needed for nausea or vomiting.   Oxycodone HCl 10 MG  Tabs Take 1 tablet (10 mg total) by mouth every 6 (six) hours as needed for severe pain. Replaces:  oxyCODONE 10 mg 12 hr tablet      Follow-up Information    [facility] Go to.   Specialty:  Behavioral Health Why:  Walk in for first appointment Contact information: [place] [address] [phone]        [facility] Go to.   Specialty:  Professional Counselor Why:  Walk in for first appoinment.  Contact information: [facility] [address] [place] [address] [phone]          Allergies  Allergen Reactions  . Aspirin Palpitations    Consultations: Palliative  Procedures/Studies: Dg Chest 2 View  Result Date: 05/12/2017 CLINICAL DATA:  Metastatic breast cancer. EXAM: CHEST  2 VIEW COMPARISON:  CT chest dated April 20, 2017. Chest x-ray dated March 18, 2017. FINDINGS: The cardiomediastinal silhouette is normal in size. Normal pulmonary vascularity. Scattered nodular densities are noted in both lungs, consistent with known pulmonary metastatic disease. No focal consolidation, pleural effusion, or pneumothorax. No acute osseous abnormality. Grossly unchanged large left breast mass. IMPRESSION: 1. No active cardiopulmonary disease. Scattered pulmonary metastases, not significantly changed. 2. Unchanged large left breast mass. Electronically Signed   By: Titus Dubin M.D.   On: 05/12/2017 14:58    None   Subjective: Patient seen and examined the bedside this morning.  Continues to denies blood transfusion.  Explained the risks, but she says she wants to be monitored without being transfused. Being discharged today to home with hospice.  Discharge Exam: Vitals:   06/04/17 0549 06/04/17 1323  BP: 128/77 122/62  Pulse: 80 95  Resp: 14 17  Temp: 98.6 F (37 C) 98.2 F (36.8 C)  SpO2: 100% 100%   Vitals:   06/03/17 0518 06/03/17 2152 06/04/17 0549 06/04/17 1323  BP: 120/64 (!)  116/57 128/77 122/62  Pulse: 84 84 80 95  Resp: 16 17 14 17   Temp: 98.7 F (37.1 C) 99 F (37.2 C) 98.6 F (37 C) 98.2 F (36.8 C)  TempSrc: Oral Oral Oral Oral  SpO2: 100% 98% 100% 100%  Weight:      Height:        General: Pt is alert, awake, not in acute distress Cardiovascular: RRR, S1/S2 +, no rubs, no gallops Left fungating breast mass Respiratory: CTA bilaterally, no wheezing, no rhonchi Abdominal: Soft, NT, ND, bowel sounds  Extremities: no edema, no cyanosis    The results of significant diagnostics from this hospitalization (including imaging, microbiology, ancillary and laboratory) are listed below for reference.     Microbiology: Recent Results (from the past 240 hour(s))  MRSA PCR Screening     Status: None   Collection Time: 06/01/17  8:02 AM  Result Value Ref Range Status   MRSA by PCR NEGATIVE NEGATIVE Final    Comment:        The GeneXpert MRSA Assay (FDA approved for NASAL specimens only), is one component of a comprehensive MRSA colonization surveillance program. It  is not intended to diagnose MRSA infection nor to guide or monitor treatment for MRSA infections.      Labs: BNP (last 3 results) Recent Labs    03/26/17 2043  BNP 751.7*   Basic Metabolic Panel: Recent Labs  Lab 06/01/17 0137 06/02/17 0218 06/03/17 0400  NA 137 138 135  K 3.5 3.2* 4.0  CL 105 105 102  CO2 23 25 25   GLUCOSE 94 91 99  BUN 19 6 5*  CREATININE 0.69 0.58 0.57  CALCIUM 8.3* 8.7* 8.7*   Liver Function Tests: Recent Labs  Lab 06/01/17 0137 06/02/17 0218 06/03/17 0400  AST 41 17 21  ALT 23 16 16   ALKPHOS 122 96 103  BILITOT 0.5 0.6 0.5  PROT 6.6 5.7* 5.8*  ALBUMIN 2.4* 1.8* 2.0*   No results for input(s): LIPASE, AMYLASE in the last 168 hours. Recent Labs  Lab 06/01/17 0137  AMMONIA 16   CBC: Recent Labs  Lab 06/01/17 0137 06/03/17 0400 06/04/17 0144  WBC 12.3* 9.7 9.6  NEUTROABS 10.8* 8.3* 8.1*  HGB 7.2* 6.7* 6.6*  HCT 22.9* 22.2*  22.6*  MCV 86.4 86.0 86.9  PLT 513* 496* 508*   Cardiac Enzymes: No results for input(s): CKTOTAL, CKMB, CKMBINDEX, TROPONINI in the last 168 hours. BNP: Invalid input(s): POCBNP CBG: Recent Labs  Lab 06/01/17 0140 06/01/17 0804  GLUCAP 90 64*   D-Dimer No results for input(s): DDIMER in the last 72 hours. Hgb A1c No results for input(s): HGBA1C in the last 72 hours. Lipid Profile No results for input(s): CHOL, HDL, LDLCALC, TRIG, CHOLHDL, LDLDIRECT in the last 72 hours. Thyroid function studies No results for input(s): TSH, T4TOTAL, T3FREE, THYROIDAB in the last 72 hours.  Invalid input(s): FREET3 Anemia work up No results for input(s): VITAMINB12, FOLATE, FERRITIN, TIBC, IRON, RETICCTPCT in the last 72 hours. Urinalysis    Component Value Date/Time   COLORURINE STRAW (A) 05/02/2017 2100   APPEARANCEUR CLEAR 05/02/2017 2100   LABSPEC 1.010 05/02/2017 2100   PHURINE 7.0 05/02/2017 2100   GLUCOSEU NEGATIVE 05/02/2017 2100   HGBUR NEGATIVE 05/02/2017 2100   BILIRUBINUR NEGATIVE 05/02/2017 2100   [place] NEGATIVE 05/02/2017 2100   PROTEINUR NEGATIVE 05/02/2017 2100   NITRITE NEGATIVE 05/02/2017 2100   LEUKOCYTESUR NEGATIVE 05/02/2017 2100   Sepsis Labs Invalid input(s): PROCALCITONIN,  WBC,  LACTICIDVEN Microbiology Recent Results (from the past 240 hour(s))  MRSA PCR Screening     Status: None   Collection Time: 06/01/17  8:02 AM  Result Value Ref Range Status   MRSA by PCR NEGATIVE NEGATIVE Final    Comment:        The GeneXpert MRSA Assay (FDA approved for NASAL specimens only), is one component of a comprehensive MRSA colonization surveillance program. It is not intended to diagnose MRSA infection nor to guide or monitor treatment for MRSA infections.      Time coordinating discharge: Over 30 minutes  SIGNED:   Marene Lenz, MD  [facility] 06/04/2017, 4:03 PM Pager [phone]  If 7PM-7AM, please contact  night-coverage [email or web address] Password [email or web address]

## 2017-06-04 NOTE — Progress Notes (Signed)
Pt given discharge instructions, prescriptions, and care notes. Pt verbalized understanding on importance of medication compliance and keeping up with follow up visits. IV was discontinued, no redness, pain, or swelling noted at this time. Patient given handout on Acetaminophen toxicity.  Pt left the floor via wheelchair with staff in stable condition and brought to taxi with voucher.

## 2017-06-04 NOTE — Care Management Note (Signed)
Case Management Note  Patient Details  Name: Ann Oliver MRN: [id number] Date of Birth: March 09, 1956  Subjective/Objective:                  Admitted with Tylenol overdose, hx of metastatic adenocarcinoma of the breast status post palliative chemo and radiation.    Action/Plan: Transition to home.  Expected Discharge Date:  06/04/17               Expected Discharge Plan:  Home w Hospice Care(Lives alone.)  In-House Referral:   CSW, taxi voucher  Discharge planning Services  CM Consult  Post Acute Care Choice:    Choice offered to:  Patient  DME Arranged:    DME Agency:     HH Arranged:   home hospice [facility] Agency:  [facility]  Status of Service:  Completed, signed off  If discussed at [employer] of Stay Meetings, dates discussed:    Additional Comments:  Sharin Mons, RN 06/04/2017, 5:55 PM

## 2017-06-07 ENCOUNTER — Telehealth: Payer: Self-pay

## 2017-06-07 LAB — TYPE AND SCREEN
ABO/RH(D): A POS
Antibody Screen: POSITIVE
DAT, IGG: POSITIVE
DONOR AG TYPE: NEGATIVE
Donor AG Type: NEGATIVE
Unit division: 0
Unit division: 0

## 2017-06-07 LAB — BPAM RBC
Blood Product Expiration Date: 201901292359
Blood Product Expiration Date: 201901292359
UNIT TYPE AND RH: 6200
Unit Type and Rh: 6200

## 2017-06-07 NOTE — Telephone Encounter (Signed)
Returned call to [facility] regarding nerve pain management for [place]. Left msg for Opal Sidles, her primary RN. Per [name] he is requesting she be seen by their medical director since she has not been seen in our office since the beginning of November. Left return number.  Cyndia Bent RN

## 2017-06-09 ENCOUNTER — Ambulatory Visit
Admit: 2017-06-09 | Discharge: 2017-06-09 | Disposition: A | Discharge status: Home / Self Care | Payer: Medicaid Other | Attending: Urology | Admitting: Urology

## 2017-06-09 DIAGNOSIS — C50412 Malignant neoplasm of upper-outer quadrant of left female breast: Secondary | ICD-10-CM

## 2017-07-16 ENCOUNTER — Telehealth: Payer: Self-pay

## 2017-07-16 NOTE — Telephone Encounter (Signed)
Returned call to Hospice RN, Opal Sidles, left VM for [employer]. Informed Opal Sidles that we are not providing any pain medication for the patient.  Cyndia Bent RN

## 2017-07-31 ENCOUNTER — Encounter (HOSPITAL_COMMUNITY): Payer: Self-pay | Admitting: Emergency Medicine

## 2017-07-31 DIAGNOSIS — R0789 Other chest pain: Secondary | ICD-10-CM | POA: Diagnosis present

## 2017-07-31 DIAGNOSIS — C7801 Secondary malignant neoplasm of right lung: Secondary | ICD-10-CM | POA: Diagnosis not present

## 2017-07-31 DIAGNOSIS — Z79899 Other long term (current) drug therapy: Secondary | ICD-10-CM | POA: Insufficient documentation

## 2017-07-31 DIAGNOSIS — F329 Major depressive disorder, single episode, unspecified: Secondary | ICD-10-CM | POA: Diagnosis not present

## 2017-07-31 DIAGNOSIS — G893 Neoplasm related pain (acute) (chronic): Secondary | ICD-10-CM | POA: Insufficient documentation

## 2017-07-31 DIAGNOSIS — C50412 Malignant neoplasm of upper-outer quadrant of left female breast: Secondary | ICD-10-CM | POA: Insufficient documentation

## 2017-07-31 MED ORDER — OXYCODONE-ACETAMINOPHEN 5-325 MG PO TABS
1.0000 | ORAL_TABLET | Freq: Once | ORAL | Status: AC
  Administered 2017-07-31: 1 via ORAL
  Filled 2017-07-31: qty 1

## 2017-07-31 NOTE — ED Notes (Signed)
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  Pt states she has been out of her oxycodone and gabapentin for a few weeks and the pain is too much to handle now.  Patient is a hospice patient.

## 2017-07-31 NOTE — ED Notes (Signed)
Patient asking to be admitted for pain control through the weekend.  States she won't be able to get any medications until Monday

## 2017-08-01 ENCOUNTER — Emergency Department (HOSPITAL_COMMUNITY)
Admission: EM | Admit: 2017-08-01 | Discharge: 2017-08-01 | Disposition: A | Discharge status: Home / Self Care | Payer: Medicaid Other | Attending: Emergency Medicine | Admitting: Emergency Medicine

## 2017-08-01 DIAGNOSIS — G893 Neoplasm related pain (acute) (chronic): Secondary | ICD-10-CM

## 2017-08-01 LAB — BASIC METABOLIC PANEL
ANION GAP: 10 (ref 5–15)
BUN: 17 mg/dL (ref 6–20)
CHLORIDE: 106 mmol/L (ref 101–111)
CO2: 23 mmol/L (ref 22–32)
Calcium: 8.9 mg/dL (ref 8.9–10.3)
Creatinine, Ser: 0.78 mg/dL (ref 0.44–1.00)
GFR calc Af Amer: >60 mL/min (ref >60)
GFR calc non Af Amer: >60 mL/min (ref >60)
GLUCOSE: 107 mg/dL — AB (ref 65–99)
Potassium: 4 mmol/L (ref 3.5–5.1)
SODIUM: 139 mmol/L (ref 135–145)

## 2017-08-01 LAB — CBC WITH DIFFERENTIAL/PLATELET
BASOS ABS: 0 10*3/uL (ref 0.0–0.1)
Basophils Relative: 0 %
Eosinophils Absolute: 0.2 10*3/uL (ref 0.0–0.7)
Eosinophils Relative: 1 %
HCT: 27.3 % — ABNORMAL LOW (ref 36.0–46.0)
Hemoglobin: 8.2 g/dL — ABNORMAL LOW (ref 12.0–15.0)
LYMPHS ABS: 0.7 10*3/uL (ref 0.7–4.0)
LYMPHS PCT: 6 %
MCH: 24 pg — AB (ref 26.0–34.0)
MCHC: 30 g/dL (ref 30.0–36.0)
MCV: 80.1 fL (ref 78.0–100.0)
Monocytes Absolute: 0.9 10*3/uL (ref 0.1–1.0)
Monocytes Relative: 7 %
NEUTROS PCT: 86 %
Neutro Abs: 10.6 10*3/uL — ABNORMAL HIGH (ref 1.7–7.7)
Platelets: 575 10*3/uL — ABNORMAL HIGH (ref 150–400)
RBC: 3.41 MIL/uL — AB (ref 3.87–5.11)
RDW: 18 % — ABNORMAL HIGH (ref 11.5–15.5)
WBC: 12.4 10*3/uL — AB (ref 4.0–10.5)

## 2017-08-01 MED ORDER — GABAPENTIN 600 MG PO TABS
600.0000 mg | ORAL_TABLET | Freq: Once | ORAL | Status: AC
  Administered 2017-08-01: 600 mg via ORAL
  Filled 2017-08-01: qty 1

## 2017-08-01 MED ORDER — HYDROMORPHONE HCL 1 MG/ML IJ SOLN
1.0000 mg | Freq: Once | INTRAMUSCULAR | Status: AC
  Administered 2017-08-01: 1 mg via INTRAVENOUS
  Filled 2017-08-01: qty 1

## 2017-08-01 MED ORDER — GABAPENTIN 600 MG PO TABS: 600.0000 mg | ORAL_TABLET | Freq: Three times a day (TID) | ORAL | 0 refills | Status: DC

## 2017-08-01 MED ORDER — OXYCODONE HCL 10 MG PO TABS: 10.0000 mg | ORAL_TABLET | ORAL | 0 refills | Status: DC | PRN

## 2017-08-01 MED ORDER — OXYCODONE HCL 5 MG PO TABS
15.0000 mg | ORAL_TABLET | Freq: Once | ORAL | Status: AC
  Administered 2017-08-01: 15 mg via ORAL
  Filled 2017-08-01: qty 3

## 2017-08-01 MED ORDER — ONDANSETRON HCL 4 MG/2ML IJ SOLN
4.0000 mg | Freq: Once | INTRAMUSCULAR | Status: AC
  Administered 2017-08-01: 4 mg via INTRAVENOUS
  Filled 2017-08-01: qty 2

## 2017-08-01 NOTE — ED Notes (Signed)
Pt reports she has been out of her Oxy for over a week, pt reports she is now out of her Gabapentin as well. Pt states she is not able to get meds filled until next well.  Education provided on overmedicating.

## 2017-08-01 NOTE — ED Notes (Signed)
Provided bus pass

## 2017-08-01 NOTE — ED Notes (Signed)
Pt refusing to allow staff to remove bandages on chest, pt states only home health nurses are allowed to remove

## 2017-08-01 NOTE — ED Notes (Signed)
Patient left at this time with all belongings. 

## 2017-08-01 NOTE — ED Provider Notes (Signed)
[facility] EMERGENCY DEPARTMENT Provider Note   CSN: [id number] Arrival date & time: 07/31/17  2212     History   Chief Complaint Chief Complaint  Patient presents with  . Shoulder Pain    HPI Ann Oliver is a 62 y.o. female.  Patient presents with complaints of pain in the area of her left chest wall and shoulder region.  This is a chronic pain secondary to cancer.  Patient reports that she has been out of her pain medication for a few weeks and the pain is intolerable.  She reports that she will not be able to get her pain medication for 2 days and would like to be admitted for pain control.  Pain is severe and similar to pain she has had in the past.      Past Medical History:  Diagnosis Date  . Breast cancer (HCC)    METS TO LUNGS  . Sinusitis   . Tylenol overdose 05/2017    Patient Active Problem List   Diagnosis Date Noted  . MDD (major depressive disorder), recurrent episode, moderate ([place]) 06/03/2017  . Suicidal ideation 06/02/2017  . Tylenol overdose 06/01/2017  . Breast mass   . Acute blood loss anemia   . Hemorrhage from wound   . Ascites   . Evaluation by psychiatric service required 04/22/2017  . AKI (acute kidney injury) ([place]) 03/26/2017  . Leg swelling 03/26/2017  . Diarrhea 03/26/2017  . Leukocytosis 03/26/2017  . Breast wound 03/18/2017  . Blood loss anemia 01/18/2017  . Port catheter in place 12/02/2016  . Necrotizing soft tissue infection 11/21/2016  . Wound infection 10/28/2016  . Cellulitis of left breast 10/28/2016  . Encounter for palliative care   . Goals of care, counseling/discussion   . Breast cancer metastasized to lung, right ([place]) 09/25/2016  . Malnutrition of moderate degree 09/25/2016  . Breast cancer of upper-outer quadrant of left female breast ([place]) 10/22/2015  . THYROID NODULE, RIGHT 05/31/2007  . Depressed 05/31/2007  . GERD 05/31/2007  . HOT FLASHES 05/31/2007  . HEADACHE 05/31/2007    Past  Surgical History:  Procedure Laterality Date  . BIOPSY BREAST    . IR FLUORO GUIDE CV LINE RIGHT  11/06/2016  . IR US GUIDE VASC ACCESS RIGHT  11/06/2016    OB History    No data available       Home Medications    Prior to Admission medications   Medication Sig Start Date End Date Taking? Authorizing Provider  gabapentin (NEURONTIN) 600 MG tablet Take 1 tablet (600 mg total) by mouth 3 (three) times daily. 05/07/17  Yes Bonnielee Haff, MD  loperamide (IMODIUM) 2 MG capsule Take 1 capsule (2 mg total) by mouth as needed for diarrhea or loose stools. 05/28/17  Yes Nicholas Lose, MD  mirtazapine (REMERON) 15 MG tablet Take 1 tablet (15 mg total) by mouth at bedtime. 06/04/17  Yes Adhikari Emeline General, MD  oxyCODONE 10 MG TABS Take 1 tablet (10 mg total) by mouth every 6 (six) hours as needed for severe pain. 06/04/17  Yes Marene Lenz, MD  gabapentin (NEURONTIN) 600 MG tablet Take 1 tablet (600 mg total) by mouth 3 (three) times daily. 08/01/17   Orpah Greek, MD  Multiple Vitamin (MULTIVITAMIN WITH MINERALS) TABS tablet Take 1 tablet by mouth daily. Patient not taking: Reported on 06/01/2017 05/07/17   Bonnielee Haff, MD  ondansetron (ZOFRAN) 8 MG tablet Take 1 tablet (8 mg total) by mouth every 8 (  eight) hours as needed for nausea or vomiting. Patient not taking: Reported on 08/01/2017 05/07/17   Bonnielee Haff, MD  Oxycodone HCl 10 MG TABS Take 1 tablet (10 mg total) by mouth every 4 (four) hours as needed for severe pain. 08/01/17   Orpah Greek, MD    Family History Family History  Family history unknown: Yes    Social History Social History   Tobacco Use  . Smoking status: Never Smoker  . Smokeless tobacco: Never Used  Substance Use Topics  . Alcohol use: No  . Drug use: No     Allergies   Aspirin   Review of Systems Review of Systems  Musculoskeletal: Positive for arthralgias.  Skin: Positive for wound.  All other systems reviewed and are  negative.    Physical Exam Updated Vital Signs BP (!) 168/98   Pulse 92   Temp 98.4 F (36.9 C) (Oral)   Resp 16   SpO2 100%   Physical Exam  Constitutional: She is oriented to person, place, and time. She appears well-developed and well-nourished. No distress.  HENT:  Head: Normocephalic and atraumatic.  Right Ear: Hearing normal.  Left Ear: Hearing normal.  Nose: Nose normal.  Mouth/Throat: Oropharynx is clear and moist and mucous membranes are normal.  Eyes: Conjunctivae and EOM are normal. Pupils are equal, round, and reactive to light.  Neck: Normal range of motion. Neck supple.  Cardiovascular: Regular rhythm, S1 normal and S2 normal. Exam reveals no gallop and no friction rub.  No murmur heard. Pulmonary/Chest: Effort normal and breath sounds normal. No respiratory distress. She exhibits no tenderness.  Abdominal: Soft. Normal appearance and bowel sounds are normal. There is no hepatosplenomegaly. There is no tenderness. There is no rebound, no guarding, no tenderness at McBurney's point and negative Murphy's sign. No hernia.  Musculoskeletal: Normal range of motion.  Neurological: She is alert and oriented to person, place, and time. She has normal strength. No cranial nerve deficit or sensory deficit. Coordination normal. GCS eye subscore is 4. GCS verbal subscore is 5. GCS motor subscore is 6.  Skin: Skin is warm, dry and intact. No rash noted. No cyanosis.  Psychiatric: She has a normal mood and affect. Her speech is normal and behavior is normal. Thought content normal.  Nursing note and vitals reviewed.    ED Treatments / Results  Labs (all labs ordered are listed, but only abnormal results are displayed) Labs Reviewed  CBC WITH DIFFERENTIAL/PLATELET - Abnormal; Notable for the following components:      Result Value   WBC 12.4 (*)    RBC 3.41 (*)    Hemoglobin 8.2 (*)    HCT 27.3 (*)    MCH 24.0 (*)    RDW 18.0 (*)    Platelets 575 (*)    Neutro Abs 10.6  (*)    All other components within normal limits  BASIC METABOLIC PANEL - Abnormal; Notable for the following components:   Glucose, Bld 107 (*)    All other components within normal limits    EKG  EKG Interpretation None       Radiology No results found.  Procedures Procedures (including critical care time)  Medications Ordered in ED Medications  oxyCODONE (Oxy IR/ROXICODONE) immediate release tablet 15 mg (not administered)  gabapentin (NEURONTIN) tablet 600 mg (not administered)  oxyCODONE-acetaminophen (PERCOCET/ROXICET) 5-325 MG per tablet 1 tablet (1 tablet Oral Given 07/31/17 2229)  HYDROmorphone (DILAUDID) injection 1 mg (1 mg Intravenous Given 08/01/17 0138)  ondansetron (  ZOFRAN) injection 4 mg (4 mg Intravenous Given 08/01/17 0138)  HYDROmorphone (DILAUDID) injection 1 mg (1 mg Intravenous Given 08/01/17 0430)  HYDROmorphone (DILAUDID) injection 1 mg (1 mg Intravenous Given 08/01/17 0605)     Initial Impression / Assessment and Plan / ED Course  I have reviewed the triage vital signs and the nursing notes.  Pertinent labs & imaging results that were available during my care of the patient were reviewed by me and considered in my medical decision making (see chart for details).     Presents with complaints of pain in the left chest and shoulder area.  This is chronic secondary to stage IV metastatic breast cancer.  Patient has a chronic wound of the chest wall but she will not allow me to evaluate it.  She is in hospice care and has a visiting nurse that cares for her wound and she says that the only person that can look at it.  She is not febrile.  She does not appear infected at this time.  Vital signs are all stable.  She has a very slight leukocytosis and her hemoglobin is above her normal baseline anemia.  Electrolytes are normal.  Patient's examination was unremarkable.  She has diffuse tenderness over the area of the pain, but otherwise appears well.  Lungs are  clear, abdominal exam was benign.  Patient administered analgesia here in the ER.  She is asking for admission, but I do not feel that she qualifies for admission at this time.  She reports that her pain is severe, but she does appear comfortable after multiple doses of analgesia.  She reports that she is going to have a prescription called in for her refills tomorrow, I will provide her with a prescription for today's pain medications.  Final Clinical Impressions(s) / ED Diagnoses   Final diagnoses:  Chronic pain due to neoplasm    ED Discharge Orders        Ordered    gabapentin (NEURONTIN) 600 MG tablet  3 times daily     08/01/17 0655    Oxycodone HCl 10 MG TABS  Every 4 hours PRN     08/01/17 0655       Orpah Greek, MD 08/01/17 [phone]

## 2017-08-01 NOTE — ED Notes (Signed)
MD Pollina notified of continued pain.

## 2017-08-05 ENCOUNTER — Telehealth: Payer: Self-pay

## 2017-08-05 NOTE — Telephone Encounter (Signed)
Returned Hospice call from [facility], she is requesting order for wound care management.  Per Dr [name], he is fine with their request for wound care with Flagyl 250 mg crushed and applied to wound, vaseline gauze with optilock dressing with cloth tape, secured with kerlix and Ace bandage. No other needs per [facility] at this time.

## 2017-08-09 ENCOUNTER — Other Ambulatory Visit: Payer: Self-pay

## 2017-08-09 MED ORDER — MIRTAZAPINE 15 MG PO TABS: 15.0000 mg | ORAL_TABLET | Freq: Every day | ORAL | 0 refills | Status: DC

## 2017-08-14 ENCOUNTER — Encounter (HOSPITAL_COMMUNITY): Payer: Self-pay | Admitting: Emergency Medicine

## 2017-08-14 ENCOUNTER — Emergency Department (HOSPITAL_COMMUNITY)
Admission: EM | Admit: 2017-08-14 | Discharge: 2017-08-16 | Disposition: A | Discharge status: Home / Self Care | Payer: Medicaid Other | Attending: Emergency Medicine | Admitting: Emergency Medicine

## 2017-08-14 DIAGNOSIS — Z760 Encounter for issue of repeat prescription: Secondary | ICD-10-CM | POA: Diagnosis not present

## 2017-08-14 DIAGNOSIS — Z79899 Other long term (current) drug therapy: Secondary | ICD-10-CM | POA: Insufficient documentation

## 2017-08-14 DIAGNOSIS — F331 Major depressive disorder, recurrent, moderate: Secondary | ICD-10-CM | POA: Insufficient documentation

## 2017-08-14 DIAGNOSIS — C50412 Malignant neoplasm of upper-outer quadrant of left female breast: Secondary | ICD-10-CM | POA: Diagnosis not present

## 2017-08-14 DIAGNOSIS — R45851 Suicidal ideations: Secondary | ICD-10-CM | POA: Diagnosis not present

## 2017-08-14 DIAGNOSIS — G893 Neoplasm related pain (acute) (chronic): Secondary | ICD-10-CM

## 2017-08-14 DIAGNOSIS — N644 Mastodynia: Secondary | ICD-10-CM | POA: Diagnosis present

## 2017-08-14 LAB — RAPID URINE DRUG SCREEN, HOSP PERFORMED
AMPHETAMINES: NOT DETECTED
BARBITURATES: NOT DETECTED
BENZODIAZEPINES: NOT DETECTED
Cocaine: NOT DETECTED
Opiates: NOT DETECTED
TETRAHYDROCANNABINOL: NOT DETECTED

## 2017-08-14 LAB — CBC
HEMATOCRIT: 28.4 % — AB (ref 36.0–46.0)
Hemoglobin: 8.4 g/dL — ABNORMAL LOW (ref 12.0–15.0)
MCH: 23 pg — ABNORMAL LOW (ref 26.0–34.0)
MCHC: 29.6 g/dL — AB (ref 30.0–36.0)
MCV: 77.8 fL — AB (ref 78.0–100.0)
PLATELETS: 713 10*3/uL — AB (ref 150–400)
RBC: 3.65 MIL/uL — ABNORMAL LOW (ref 3.87–5.11)
RDW: 17.6 % — ABNORMAL HIGH (ref 11.5–15.5)
WBC: 14.7 10*3/uL — ABNORMAL HIGH (ref 4.0–10.5)

## 2017-08-14 LAB — BASIC METABOLIC PANEL
Anion gap: 10 (ref 5–15)
BUN: 19 mg/dL (ref 6–20)
CALCIUM: 9.1 mg/dL (ref 8.9–10.3)
CO2: 23 mmol/L (ref 22–32)
CREATININE: 0.86 mg/dL (ref 0.44–1.00)
Chloride: 105 mmol/L (ref 101–111)
GFR calc Af Amer: >60 mL/min (ref >60)
GFR calc non Af Amer: >60 mL/min (ref >60)
GLUCOSE: 104 mg/dL — AB (ref 65–99)
Potassium: 4.4 mmol/L (ref 3.5–5.1)
Sodium: 138 mmol/L (ref 135–145)

## 2017-08-14 LAB — ACETAMINOPHEN LEVEL: ACETAMINOPHEN (TYLENOL), SERUM: 17 ug/mL (ref 10–30)

## 2017-08-14 LAB — SALICYLATE LEVEL: Salicylate Lvl: <7 mg/dL (ref 2.8–30.0)

## 2017-08-14 LAB — ETHANOL: Alcohol, Ethyl (B): <10 mg/dL (ref <10)

## 2017-08-14 MED ORDER — GABAPENTIN 300 MG PO CAPS
600.0000 mg | ORAL_CAPSULE | Freq: Once | ORAL | Status: AC
  Administered 2017-08-14: 600 mg via ORAL
  Filled 2017-08-14: qty 2

## 2017-08-14 MED ORDER — OXYCODONE HCL ER 10 MG PO T12A
10.0000 mg | EXTENDED_RELEASE_TABLET | Freq: Once | ORAL | Status: AC
  Administered 2017-08-14: 10 mg via ORAL
  Filled 2017-08-14: qty 1

## 2017-08-14 MED ORDER — GABAPENTIN 600 MG PO TABS: 600.0000 mg | ORAL_TABLET | Freq: Three times a day (TID) | ORAL | 0 refills | Status: DC

## 2017-08-14 MED ORDER — OXYCODONE HCL 10 MG PO TABS: 10.0000 mg | ORAL_TABLET | ORAL | 0 refills | Status: DC | PRN

## 2017-08-14 NOTE — BH Assessment (Signed)
Tele Assessment Note   Patient Name: Ann Oliver MRN: [id number] Referring Physician: Roxine Caddy, MD Location of Patient: [facility] ED A08C Location of Provider: [place] Department  Ann Oliver is an 62 y.o. female. Patient initially seen for breast pain, due to having cancer. After treatment in ER for breast pain, just prior to discharge, patient endorsed to the nurse that she has decided that she does not want to live anymore. Patient reported that she is done with everything. Pt did not endorse a specific plan. Patient reported having pain for the past week. When asked would you be safe if you were discharged, patient stated "I would not be safe, in my head I don't feel safe". Patient reported her stressor is the pain levels as it becomes stronger and stronger, stating I don't want to deal with it anymore. Patient unable to recall 05/2017 attempted overdose on Tylenol. Patient reported being inpatient 1 year ago, when asked why, patient stated "same thing", however patient was unable to clarify.   Patient reports being out of her oxycodone for a week or 2.  She talked to her doctor, who states they cannot refill it until Monday.  She states she ran out of her gabapentin today.  Patient states that when she was here last time, 'nothing was done for her.'When I inquired about the IV pain medicine, oral pain medicine, and pain prescriptions that she was given last time, patient states the medicines given in the ER did not work, and she was unable to get her prescriptions filled. She reports the pain makes it hard to breath. Patient endorsed present suicidal ideations with no plan. Patient resides alone and has no family support. Patient is involved with Hospice.      Diagnosis: Major Depressive Disorder, recurrent, severe  Past Medical History:  Past Medical History:  Diagnosis Date  . Breast cancer (HCC)    METS TO LUNGS  . Sinusitis   . Tylenol overdose 05/2017    Past  Surgical History:  Procedure Laterality Date  . BIOPSY BREAST    . IR FLUORO GUIDE CV LINE RIGHT  11/06/2016  . IR US GUIDE VASC ACCESS RIGHT  11/06/2016    Family History:  Family History  Family history unknown: Yes    Social History:  reports that she has never smoked. She has never used smokeless tobacco. She reports that she does not drink alcohol or use drugs.  Additional Social History:     CIWA: CIWA-Ar BP: (!) 182/93 Pulse Rate: 96 COWS:    Allergies:  Allergies  Allergen Reactions  . Aspirin Palpitations    Home Medications:  (Not in a hospital admission)  OB/GYN Status:  No LMP recorded. Patient is postmenopausal.  General Assessment Data Location of Assessment: [facility] ED TTS Assessment: In system Is this a Tele or Face-to-Face Assessment?: Face-to-Face Is this an Initial Assessment or a Re-assessment for this encounter?: Initial Assessment Marital status: Single Is patient pregnant?: No Pregnancy Status: No Living Arrangements: Alone Can pt return to current living arrangement?: Yes Admission Status: Voluntary Is patient capable of signing voluntary admission?: Yes Referral Source: Self/Family/Friend  Medical Screening Exam ([place]) Medical Exam completed: Yes  Crisis Care Plan Living Arrangements: Alone Legal Guardian: (self) Name of Psychiatrist: n/a Name of Therapist: n/a  Education Status Is patient currently in school?: No Is the patient employed, unemployed or receiving disability?: Receiving disability income  Risk to self with the past 6 months Suicidal Ideation: Yes-Currently Present Has  patient been a risk to self within the past 6 months prior to admission? : Yes Suicidal Intent: Yes-Currently Present Has patient had any suicidal intent within the past 6 months prior to admission? : Yes Is patient at risk for suicide?: Yes Suicidal Plan?: No-Not Currently/Within Last 6 Months Has patient had any suicidal plan within the past  6 months prior to admission? : Yes Access to Means: No What has been your use of drugs/alcohol within the last 12 months?: (none) Previous Attempts/Gestures: No How many times?: (n/a) Other Self Harm Risks: (none) Triggers for Past Attempts: Other (Comment)(breast pain) Intentional Self Injurious Behavior: None Family Suicide History: No Recent stressful life event(s): Other (Comment)(breast pain) Persecutory voices/beliefs?: No Depression: Yes Depression Symptoms: Tearfulness, Isolating, Fatigue, Feeling angry/irritable Substance abuse history and/or treatment for substance abuse?: No Suicide prevention information given to non-admitted patients: Not applicable  Risk to Others within the past 6 months Homicidal Ideation: No Does patient have any lifetime risk of violence toward others beyond the six months prior to admission? : No Thoughts of Harm to Others: No Current Homicidal Intent: No Current Homicidal Plan: No Access to Homicidal Means: No Identified Victim: (n/a) History of harm to others?: No Assessment of Violence: None Noted Violent Behavior Description: (n/a) Does patient have access to weapons?: No Criminal Charges Pending?: No Does patient have a court date: No Is patient on probation?: No  Psychosis Hallucinations: None noted Delusions: None noted  Mental Status Report Appearance/Hygiene: Unremarkable Eye Contact: Poor Motor Activity: Restlessness Speech: Slurred, Slow Level of Consciousness: Drowsy Mood: Depressed, Sad, Despair Affect: Depressed, Sad Anxiety Level: Minimal Thought Processes: Coherent Judgement: Impaired Orientation: Person, Place, Time, Situation Obsessive Compulsive Thoughts/Behaviors: None  Cognitive Functioning Concentration: Fair Memory: Remote Impaired Is patient IDD: No Is patient DD?: No Insight: Fair Impulse Control: Poor Appetite: Fair Have you had any weight changes? : No Change Sleep: No Change Total Hours of  Sleep: (unknown) Vegetative Symptoms: None  ADLScreening [facility] Assessment Services) Patient's cognitive ability adequate to safely complete daily activities?: Yes Patient able to express need for assistance with ADLs?: Yes Independently performs ADLs?: Yes (appropriate for developmental age)  Prior Inpatient Therapy Prior Inpatient Therapy: Yes Prior Therapy Dates: (1 year ago) Prior Therapy Facilty/Provider(s): (unknown) Reason for Treatment: ("same thing")  Prior Outpatient Therapy Prior Outpatient Therapy: No Does patient have an ACCT team?: No Does patient have Intensive In-House Services?  : No Does patient have [facility] services? : No Does patient have P4CC services?: No  ADL Screening (condition at time of admission) Patient's cognitive ability adequate to safely complete daily activities?: Yes Patient able to express need for assistance with ADLs?: Yes Independently performs ADLs?: Yes (appropriate for developmental age)                  Additional Information 1:1 In Past 12 Months?: No CIRT Risk: No Elopement Risk: No Does patient have medical clearance?: Yes     Disposition: Per Lindon Romp, NP, patient recommended for Geri-psych/social work due to patient being involved with Hospice. Lyndal Rainbow, PA and Levada Dy, RN notified of disposition. Disposition Initial Assessment Completed for this Encounter: Yes Disposition of Patient: (pending review with psyhiatrist)  This service was provided via telemedicine using a 2-way, interactive audio and video technology.  Names of all persons participating in this telemedicine service and their role in this encounter. Name: Doris Gruhn Role: patient  Name: Kirtland Bouchard, [place] Role: TTS Counselor  Name: Levada Dy, RN Role: RN  Name:  Role:      Venora Maples 08/14/2017 11:15 PM

## 2017-08-14 NOTE — ED Provider Notes (Signed)
[facility] EMERGENCY DEPARTMENT Provider Note   CSN: [id number] Arrival date & time: 08/14/17  2051     History   Chief Complaint Chief Complaint  Patient presents with  . Breast Pain  . Suicidal    HPI Ann Oliver is a 62 y.o. female sending for evaluation of pain.  Patient states she has continued breast pain.  This is from her cancer.  She states she has been out of her oxycodone for a week or 2.  She talked to her doctor, who states they cannot refill it until Monday.  She states she ran out of her gabapentin today.  Patient states that when she was here last time, 'nothing was done for her.'When I inquired about the IV pain medicine, oral pain medicine, and pain prescriptions that she was given last time, patient states the medicines given in the ER did not work, and she was unable to get her prescriptions filled. She reports the pain makes it hard to breath. No recent fevers, chills, cough, n/v, abd pain, urinary sxs, or abnormal BMs.   HPI  Past Medical History:  Diagnosis Date  . Breast cancer (HCC)    METS TO LUNGS  . Sinusitis   . Tylenol overdose 05/2017    Patient Active Problem List   Diagnosis Date Noted  . MDD (major depressive disorder), recurrent episode, moderate ([place]) 06/03/2017  . Suicidal ideation 06/02/2017  . Tylenol overdose 06/01/2017  . Breast mass   . Acute blood loss anemia   . Hemorrhage from wound   . Ascites   . Evaluation by psychiatric service required 04/22/2017  . AKI (acute kidney injury) ([place]) 03/26/2017  . Leg swelling 03/26/2017  . Diarrhea 03/26/2017  . Leukocytosis 03/26/2017  . Breast wound 03/18/2017  . Blood loss anemia 01/18/2017  . Port catheter in place 12/02/2016  . Necrotizing soft tissue infection 11/21/2016  . Wound infection 10/28/2016  . Cellulitis of left breast 10/28/2016  . Encounter for palliative care   . Goals of care, counseling/discussion   . Breast cancer metastasized to lung,  right ([place]) 09/25/2016  . Malnutrition of moderate degree 09/25/2016  . Breast cancer of upper-outer quadrant of left female breast ([place]) 10/22/2015  . THYROID NODULE, RIGHT 05/31/2007  . Depressed 05/31/2007  . GERD 05/31/2007  . HOT FLASHES 05/31/2007  . HEADACHE 05/31/2007    Past Surgical History:  Procedure Laterality Date  . BIOPSY BREAST    . IR FLUORO GUIDE CV LINE RIGHT  11/06/2016  . IR US GUIDE VASC ACCESS RIGHT  11/06/2016     OB History   None      Home Medications    Prior to Admission medications   Medication Sig Start Date End Date Taking? Authorizing Provider  gabapentin (NEURONTIN) 600 MG tablet Take 1 tablet (600 mg total) by mouth 3 (three) times daily. 08/14/17  Yes Kazoua Gossen, PA-C  loperamide (IMODIUM) 2 MG capsule Take 1 capsule (2 mg total) by mouth as needed for diarrhea or loose stools. 05/28/17  Yes Nicholas Lose, MD  mirtazapine (REMERON) 15 MG tablet Take 1 tablet (15 mg total) by mouth at bedtime. 08/09/17  Yes Nicholas Lose, MD  Oxycodone HCl 10 MG TABS Take 1 tablet (10 mg total) by mouth every 4 (four) hours as needed. 08/14/17  Yes Rakeya Glab, PA-C  Multiple Vitamin (MULTIVITAMIN WITH MINERALS) TABS tablet Take 1 tablet by mouth daily. Patient not taking: Reported on 06/01/2017 05/07/17   Bonnielee Haff, MD  ondansetron (ZOFRAN) 8 MG tablet Take 1 tablet (8 mg total) by mouth every 8 (eight) hours as needed for nausea or vomiting. Patient not taking: Reported on 08/01/2017 05/07/17   Bonnielee Haff, MD    Family History Family History  Family history unknown: Yes    Social History Social History   Tobacco Use  . Smoking status: Never Smoker  . Smokeless tobacco: Never Used  Substance Use Topics  . Alcohol use: No  . Drug use: No     Allergies   Aspirin   Review of Systems Review of Systems  Musculoskeletal:       Breast pain 2/2 cancer  All other systems reviewed and are negative.    Physical Exam Updated  Vital Signs BP (!) 182/93 (BP Location: Right Arm)   Pulse 96   Temp 98.8 F (37.1 C) (Oral)   Resp 16   Ht 5\' 1"  (1.549 m)   Wt 50.3 kg (111 lb)   SpO2 100%   BMI 20.97 kg/m   Physical Exam  Constitutional: She is oriented to person, place, and time. She appears well-developed and well-nourished. No distress.  HENT:  Head: Normocephalic and atraumatic.  Eyes: EOM are normal.  Neck: Normal range of motion.  Cardiovascular: Normal rate, regular rhythm and intact distal pulses.  Pulmonary/Chest: Effort normal and breath sounds normal. No respiratory distress. She has no wheezes.  Speaking in full sentences. Clear lung sounds  Abdominal: She exhibits no distension.  Musculoskeletal: Normal range of motion.  Neurological: She is alert and oriented to person, place, and time.  Skin: Skin is warm. No rash noted.  Psychiatric: She has a normal mood and affect.  Nursing note and vitals reviewed.    ED Treatments / Results  Labs (all labs ordered are listed, but only abnormal results are displayed) Labs Reviewed  CBC - Abnormal; Notable for the following components:      Result Value   WBC 14.7 (*)    RBC 3.65 (*)    Hemoglobin 8.4 (*)    HCT 28.4 (*)    MCV 77.8 (*)    MCH 23.0 (*)    MCHC 29.6 (*)    RDW 17.6 (*)    Platelets 713 (*)    All other components within normal limits  BASIC METABOLIC PANEL - Abnormal; Notable for the following components:   Glucose, Bld 104 (*)    All other components within normal limits  ETHANOL  SALICYLATE LEVEL  ACETAMINOPHEN LEVEL  RAPID URINE DRUG SCREEN, HOSP PERFORMED    EKG None  Radiology No results found.  Procedures Procedures (including critical care time)  Medications Ordered in ED Medications  gabapentin (NEURONTIN) tablet 600 mg (has no administration in time range)  loperamide (IMODIUM) capsule 2 mg (has no administration in time range)  mirtazapine (REMERON) tablet 15 mg (has no administration in time range)    Oxycodone HCl TABS 10 mg (has no administration in time range)  gabapentin (NEURONTIN) capsule 600 mg (600 mg Oral Given 08/14/17 2155)  oxyCODONE (OXYCONTIN) 12 hr tablet 10 mg (10 mg Oral Given 08/14/17 2155)  acetaminophen (TYLENOL) tablet 1,000 mg (1,000 mg Oral Given 08/15/17 0009)     Initial Impression / Assessment and Plan / ED Course  I have reviewed the triage vital signs and the nursing notes.  Pertinent labs & imaging results that were available during my care of the patient were reviewed by me and considered in my medical decision making (see chart for  details).     Patient presenting for medication refill and chronic pain due to cancer.  Physical exam shows patient who is afebrile, not tachycardic, and appears in no distress.  Had a long conversation about chronic pain use and appropriate prescriptions.  Discussed that this needs to be addressed by her hospice doctor.  Discussed with patient that I am concerned if she continues to get her prescriptions filled in the ER, she will be unable to get any pain medicine in the future.  Will give dose of at home pain medication in the ER.   PMP checked, patient without narcotic prescription in the past several weeks.  Prescription was not filled after her visit on the 10th.  Discussed with attending, Dr. Tamera Punt agrees to plan.  Will discharge with short course of oxycodone 10 and gabapentin until patient can get refill on Monday.  Discussed with patient, who agrees to plan.  At this time, patient appears safe for discharge.  Return precautions given.  Patient states she understands.  Just prior to discharge, patient endorsed to the nurse that she has decided that she does not want to live anymore.  She states that she is done with everything. Pt did not endorse a specific plan. Likely 2/2 pain and cancer. Will obtain labs and have TTS evaluate.   WBC and platelets trending up since last visit, likely 2/2 cancer. As pt is without fevers,  chills, or cough, doubt PNA or other infection. Pt is medically cleared at this time.  Pt is recommended for geri-psych. Home meds reordered    Final Clinical Impressions(s) / ED Diagnoses   Final diagnoses:  Chronic pain due to neoplasm  Medication refill    ED Discharge Orders        Ordered    gabapentin (NEURONTIN) 600 MG tablet  3 times daily     08/14/17 2154    Oxycodone HCl 10 MG TABS  Every 4 hours PRN     08/14/17 2154       Franchot Heidelberg, PA-C 08/15/17 0023    Malvin Johns, MD 08/15/17 1505

## 2017-08-14 NOTE — Discharge Instructions (Signed)
It is important that you follow-up with your hospice doctor for further refills of your medications. Return to the hospital if you develop fevers, chest pain, worsening breathing, or any new or concerning symptoms.

## 2017-08-14 NOTE — ED Triage Notes (Signed)
Brought by ems from home for c/o left breast pain.  Hx of neoplasm of left breast seen on 10th for the same.  Per patient has not had oxycodone or gabapentin for several days.

## 2017-08-15 ENCOUNTER — Other Ambulatory Visit: Payer: Self-pay

## 2017-08-15 MED ORDER — LOPERAMIDE HCL 2 MG PO CAPS
2.0000 mg | ORAL_CAPSULE | ORAL | Status: DC | PRN
  Administered 2017-08-15: 2 mg via ORAL
  Filled 2017-08-15: qty 1

## 2017-08-15 MED ORDER — ACETAMINOPHEN 500 MG PO TABS
1000.0000 mg | ORAL_TABLET | Freq: Once | ORAL | Status: AC
Start: 2017-08-15 — End: 2017-08-15
  Administered 2017-08-15: 1000 mg via ORAL
  Filled 2017-08-15: qty 2

## 2017-08-15 MED ORDER — MIRTAZAPINE 15 MG PO TABS
15.0000 mg | ORAL_TABLET | Freq: Every day | ORAL | Status: DC
  Administered 2017-08-15 (×2): 15 mg via ORAL
  Filled 2017-08-15 (×2): qty 1

## 2017-08-15 MED ORDER — GABAPENTIN 300 MG PO CAPS
600.0000 mg | ORAL_CAPSULE | Freq: Three times a day (TID) | ORAL | Status: DC
  Administered 2017-08-15 – 2017-08-16 (×4): 600 mg via ORAL
  Filled 2017-08-15 (×4): qty 2

## 2017-08-15 MED ORDER — ACETAMINOPHEN 325 MG PO TABS: 325.0000 mg | ORAL_TABLET | Freq: Once | ORAL | Status: DC

## 2017-08-15 MED ORDER — OXYCODONE-ACETAMINOPHEN 5-325 MG PO TABS
1.0000 | ORAL_TABLET | Freq: Once | ORAL | Status: AC
  Administered 2017-08-15: 1 via ORAL
  Filled 2017-08-15: qty 1

## 2017-08-15 MED ORDER — OXYCODONE HCL 5 MG PO TABS
10.0000 mg | ORAL_TABLET | ORAL | Status: DC | PRN
  Administered 2017-08-15 – 2017-08-16 (×9): 10 mg via ORAL
  Filled 2017-08-15 (×9): qty 2

## 2017-08-15 MED ORDER — ACETAMINOPHEN 325 MG PO TABS
650.0000 mg | ORAL_TABLET | Freq: Four times a day (QID) | ORAL | Status: DC | PRN
  Administered 2017-08-15 – 2017-08-16 (×5): 650 mg via ORAL
  Filled 2017-08-15 (×5): qty 2

## 2017-08-15 NOTE — ED Notes (Signed)
Pt stated her pain level went to 0 while she was napping.

## 2017-08-15 NOTE — Care Management (Addendum)
ED CM received call from SW and Nurse from [facility] concerning patient, and the amount of request for refills too soon.  As per Nurse the concerns are that  patient lives alone and does not have any family support to better assist with Hospice supportive care.  CM will leave handoff with ED CSW and ED CM to follow up  assisting with a safe care transitional plan.

## 2017-08-15 NOTE — ED Notes (Signed)
Pts dinner tray ordered.  

## 2017-08-15 NOTE — ED Notes (Signed)
Pain med given 

## 2017-08-15 NOTE — ED Notes (Signed)
Pt asking if it is time for her pain med - advised 1830. Voiced understanding.

## 2017-08-15 NOTE — BH Assessment (Signed)
Per Lindon Romp, NP, recommended patient for Geri-psych/social work due to patient being involved with Hospice. Lyndal Rainbow, PA and Levada Dy, RN notified of disposition.

## 2017-08-15 NOTE — BHH Counselor (Signed)
Telephone call from [place]. Case manager has spoken with pt's care team, and they feel that inpatient geropsych placement will not benefit patient. Writer spoke w/ Zerita Boers NP re: case manager's concerns. NP recommends that pt be kept in [facility] overnight and then will be reassessed by the TTS NP tomorrow am. Writer notified case manager of new disposition recommendation.   Arnold Long, [place] Therapeutic Triage Specialist

## 2017-08-15 NOTE — ED Notes (Signed)
Patient provided with decaf coffee

## 2017-08-15 NOTE — ED Notes (Signed)
Pt asking for pain med   Not time  Discussed with dr Venora Maples  He will order an additional dose

## 2017-08-15 NOTE — BH Assessment (Signed)
Patient cooperative and oriented x 4 during reassessment. She says she had a rough night due to pain and she didn't sleep well. She also says she has a hard time sleeping with a sitter in the room. Pt says, "I'm not settled." Pt is referring to not feeling safe. She currently denies suicidal ideations and homicidal ideations. She denies any type of hallucinations and no delusions noted. Zerita Boers NP recommends geropsychiatric placement for patient.  Arnold Long, [place] Therapeutic Triage Specialist

## 2017-08-15 NOTE — Care Management (Signed)
Contacted [facility] Assessment Counselor Dustin Flock concerning disposition plan for inpatient after speaking with patient and hospice nurse, patient has cancer and has has said that she is always hurting rating 10/10 on pain scale. Patient states she use tylenol when she runs out Hospice has been trying to assist patient with pain control but concerned because patient does not have good support to assist with medication management.  ED CM and Hospice Nurse does not think that patient would benefit from inpatient psych. Counselor discussed with [facility] Provider, who has agreed to observe patient overnight an reassess in the am. CM updated EDP and Archivist on [place] [facility].

## 2017-08-15 NOTE — Care Management (Addendum)
ED CM reviewed patient's record, patient is active with [facility]. Hx of Breast Ca and has a large large, fungating mass to the left breast.  Patient was assessed by [facility] with recommendations for inpatient Geri-Psych placement. CM met with patient at bedside she denies being SI/HI/AH.  Patient confirms being active with [facility], states, she cannot get anymore pain medication until tomorrow 3/25. CM contacted hospice nurse concerning patient. Spoke with hospice nurse Rise Paganini who reports, they have been having issues with assisting patient with management of pain meds.  Patient has a left breast mass with dressing changes that is managed by [facility], she lives alone and does not have the family support to assist with managing her care at home.  As per hospice team patient has been running out of pain medications too soon, and has been calling in for refills quite frequently.  Patient denies SI/HI at this time, CM discussed with EDP,  and will contact [place] assessment counselor to discuss disposition  Recommendation and transitional care plan from ED.

## 2017-08-15 NOTE — ED Notes (Addendum)
Pt arrived to [facility] via stretcher. Pt ambulated to bed - wearing burgundy scrubs. Pt noted w/bandage to breast - foul odor from area noted.

## 2017-08-16 DIAGNOSIS — R45851 Suicidal ideations: Secondary | ICD-10-CM

## 2017-08-16 DIAGNOSIS — F331 Major depressive disorder, recurrent, moderate: Secondary | ICD-10-CM | POA: Diagnosis not present

## 2017-08-16 DIAGNOSIS — C50919 Malignant neoplasm of unspecified site of unspecified female breast: Secondary | ICD-10-CM | POA: Diagnosis not present

## 2017-08-16 DIAGNOSIS — Z560 Unemployment, unspecified: Secondary | ICD-10-CM | POA: Diagnosis not present

## 2017-08-16 MED ORDER — ONDANSETRON 4 MG PO TBDP
4.0000 mg | ORAL_TABLET | Freq: Once | ORAL | Status: AC
  Administered 2017-08-16: 4 mg via ORAL
  Filled 2017-08-16: qty 1

## 2017-08-16 NOTE — ED Notes (Signed)
Pt aware of how often she can receive her pain medications. Usually calls an hour before she can have them.

## 2017-08-16 NOTE — Consult Note (Signed)
Telepsych Consultation   Reason for Consult: Suicidal statements related to pain Referring Physician: EDP Location of Patient: [facility]  Location of Provider: [place] Department  Patient Identification: Daisja Kessinger MRN:  [id number] Principal Diagnosis: MDD (major depressive disorder), recurrent episode, moderate ([place]) Diagnosis:   Patient Active Problem List   Diagnosis Date Noted  . MDD (major depressive disorder), recurrent episode, moderate ([employer]) [F33.1] 06/03/2017  . Suicidal ideation [R45.851] 06/02/2017  . Tylenol overdose [T39.1X1A] 06/01/2017  . Breast mass [N63.0]   . Acute blood loss anemia [D62]   . Hemorrhage from wound [R58]   . Ascites [R18.8]   . Evaluation by psychiatric service required [Z00.8] 04/22/2017  . AKI (acute kidney injury) ([place]) [N17.9] 03/26/2017  . Leg swelling [M79.89] 03/26/2017  . Diarrhea [R19.7] 03/26/2017  . Leukocytosis [D72.829] 03/26/2017  . Breast wound [S21.009A] 03/18/2017  . Blood loss anemia [D50.0] 01/18/2017  . Port catheter in place [facility] 12/02/2016  . Necrotizing soft tissue infection [M79.89] 11/21/2016  . Wound infection [T14.8XXA, L08.9] 10/28/2016  . Cellulitis of left breast [N61.0] 10/28/2016  . Encounter for palliative care [Z51.5]   . Goals of care, counseling/discussion [Z71.89]   . 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 Malnutrition of moderate degree [E44.0] 09/25/2016  . Breast cancer of upper-outer quadrant of left female breast ([place]) [C50.412] 10/22/2015  . THYROID NODULE, RIGHT [E04.1] 05/31/2007  . Depressed [F32.9] 05/31/2007  . GERD [K21.9] 05/31/2007  . HOT FLASHES [N95.1] 05/31/2007  . HEADACHE [R51] 05/31/2007    Total Time spent with patient: 20 minutes  Subjective:   Joliana Claflin is a 62 y.o. female patient admitted with increased pain from breast CA. She made a suicidal comment at the hospital "when I was in pain. It will  make you say all kinds of things. I'm feeling better now with the medication changes."   HPI:    Per initial [facility] Assessment Note 08/14/2017 by Kirtland Bouchard Counselor:   Breezy Hertenstein is an 62 y.o. female. Patient initially seen for breast pain, due to having cancer. After treatment in ER for breast pain, just prior to discharge, patient endorsed to the nurse that she has decided that she does not want to live anymore. Patient reported that she is done with everything. Pt did not endorse a specific plan. Patient reported having pain for the past week. When asked would you be safe if you were discharged, patient stated "I would not be safe, in my head I don't feel safe". Patient reported her stressor is the pain levels as it becomes stronger and stronger, stating I don't want to deal with it anymore. Patient unable to recall 05/2017 attempted overdose on Tylenol. Patient reported being inpatient 1 year ago, when asked why, patient stated "same thing", however patient was unable to clarify.   Patient reports being out of her oxycodone for a week or 2. She talked to her doctor, who states they cannot refill it until Monday. She states she ran out of her gabapentin today. Patient states that when she was here last time, 'nothing was done for her.'When I inquired about the IV pain medicine, oral pain medicine, and pain prescriptions that she was given last time, patient states the medicines given in the ER did not work, and she was unable to get her prescriptions filled.She reports the pain makes it hard to breath. Patient endorsed present suicidal ideations with no plan. Patient resides alone and has no family support.  Patient is involved with Hospice.    Per psychiatric assessment 08/16/2017:  Patient was calm and cooperative this morning. Fully alert and oriented. She denies any suicidal ideation today stating "I came for pain. Pain will make you say things like that. I have never tried to hurt  myself. I have pain on my side. I feel better now. I am taking oxycodone and neurontin. I feel a perked up. I should be going home today or tomorrow. I do not feel depressed. I just do not like being in pain." Shalonda denies any symptoms consistent with depression. There is no evidence of psychotic process during the assessment. Patient was focused on her medical issues denying any acute psychiatric symptoms. She reports having a RN that comes to her home and that she lives with family.  Past Psychiatric History: Depression   Risk to Self: Suicidal Ideation: Denies as of 08/16/2017  Suicidal Intent: Denies  Is patient at risk for suicide?: Yes Suicidal Plan?: No-Not Currently/Within Last 6 Months Access to Means: No What has been your use of drugs/alcohol within the last 12 months?: (none) How many times?: (n/a) Other Self Harm Risks: (none) Triggers for Past Attempts: Other (Comment)(breast pain) Intentional Self Injurious Behavior: None Risk to Others: Homicidal Ideation: No Thoughts of Harm to Others: No Current Homicidal Intent: No Current Homicidal Plan: No Access to Homicidal Means: No Identified Victim: (n/a) History of harm to others?: No Assessment of Violence: None Noted Violent Behavior Description: (n/a) Does patient have access to weapons?: No Criminal Charges Pending?: No Does patient have a court date: No Prior Inpatient Therapy: Prior Inpatient Therapy: Yes Prior Therapy Dates: (1 year ago) Prior Therapy Facilty/Provider(s): (unknown) Reason for Treatment: ("same thing") Prior Outpatient Therapy: Prior Outpatient Therapy: No Does patient have an ACCT team?: No Does patient have Intensive In-House Services?  : No Does patient have [facility] services? : No Does patient have P4CC services?: No  Past Medical History:  Past Medical History:  Diagnosis Date  . Breast cancer (HCC)    METS TO LUNGS  . Sinusitis   . Tylenol overdose 05/2017    Past Surgical History:   Procedure Laterality Date  . BIOPSY BREAST    . IR FLUORO GUIDE CV LINE RIGHT  11/06/2016  . IR US GUIDE VASC ACCESS RIGHT  11/06/2016   Family History:  Family History  Family history unknown: Yes   Family Psychiatric  History: Denies Social History:  Social History   Substance and Sexual Activity  Alcohol Use No     Social History   Substance and Sexual Activity  Drug Use No    Social History   Socioeconomic History  . Marital status: Single    Spouse name: Not on file  . Number of children: Not on file  . Years of education: Not on file  . Highest education level: Not on file  Occupational History  . Occupation: unemployed  Social Needs  . Financial resource strain: Not on file  . Food insecurity:    Worry: Not on file    Inability: Not on file  . Transportation needs:    Medical: Not on file    Non-medical: Not on file  Tobacco Use  . Smoking status: Never Smoker  . Smokeless tobacco: Never Used  Substance and Sexual Activity  . Alcohol use: No  . Drug use: No  . Sexual activity: Not on file  Lifestyle  . Physical activity:    Days per week: Not on file  Minutes per session: Not on file  . Stress: Not on file  Relationships  . Social connections:    Talks on phone: Not on file    Gets together: Not on file    Attends religious service: Not on file    Active member of club or organization: Not on file    Attends meetings of clubs or organizations: Not on file    Relationship status: Not on file  Other Topics Concern  . Not on file  Social History Narrative  . Not on file   Additional Social History:    Allergies:   Allergies  Allergen Reactions  . Aspirin Palpitations    Labs:  Results for orders placed or performed during the hospital encounter of 08/14/17 (from the past 48 hour(s))  Rapid urine drug screen (hospital performed)     Status: None   Collection Time: 08/14/17 10:39 PM  Result Value Ref Range   Opiates NONE DETECTED NONE  DETECTED   Cocaine NONE DETECTED NONE DETECTED   Benzodiazepines NONE DETECTED NONE DETECTED   Amphetamines NONE DETECTED NONE DETECTED   Tetrahydrocannabinol NONE DETECTED NONE DETECTED   Barbiturates NONE DETECTED NONE DETECTED    Comment: (NOTE) DRUG SCREEN FOR MEDICAL PURPOSES ONLY.  IF CONFIRMATION IS NEEDED FOR ANY PURPOSE, NOTIFY LAB WITHIN 5 DAYS. LOWEST DETECTABLE LIMITS FOR URINE DRUG SCREEN Drug Class                     Cutoff (ng/mL) Amphetamine and metabolites    1000 Barbiturate and metabolites    200 Benzodiazepine                 161 Tricyclics and metabolites     300 Opiates and metabolites        300 Cocaine and metabolites        300 THC                            50 Performed at [facility], [place] [address], [place], [place] [address]   CBC     Status: Abnormal   Collection Time: 08/14/17 10:48 PM  Result Value Ref Range   WBC 14.7 (H) 4.0 - 10.5 K/uL   RBC 3.65 (L) 3.87 - 5.11 MIL/uL   Hemoglobin 8.4 (L) 12.0 - 15.0 g/dL   HCT 28.4 (L) 36.0 - 46.0 %   MCV 77.8 (L) 78.0 - 100.0 fL   MCH 23.0 (L) 26.0 - 34.0 pg   MCHC 29.6 (L) 30.0 - 36.0 g/dL   RDW 17.6 (H) 11.5 - 15.5 %   Platelets 713 (H) 150 - 400 K/uL    Comment: Performed at [facility], [place] [address] [place], [place] [address]  Basic metabolic panel     Status: Abnormal   Collection Time: 08/14/17 10:48 PM  Result Value Ref Range   Sodium 138 135 - 145 mmol/L   Potassium 4.4 3.5 - 5.1 mmol/L   Chloride 105 101 - 111 mmol/L   CO2 23 22 - 32 mmol/L   Glucose, Bld 104 (H) 65 - 99 mg/dL   BUN 19 6 - 20 mg/dL   Creatinine, Ser 0.86 0.44 - 1.00 mg/dL   Calcium 9.1 8.9 - 10.3 mg/dL   GFR calc non Af Amer >60 >60 mL/min   GFR calc Af Amer >60 >60 mL/min    Comment: (NOTE) The eGFR has been calculated using  the CKD EPI equation. This calculation has not been validated in all clinical situations. eGFR's persistently <60 mL/min signify possible Chronic Kidney Disease.    Anion gap  10 5 - 15    Comment: Performed at [place] [address], [place], [place] [address]  Ethanol     Status: None   Collection Time: 08/14/17 10:49 PM  Result Value Ref Range   Alcohol, Ethyl (B) <10 <10 mg/dL    Comment:        LOWEST DETECTABLE LIMIT FOR SERUM ALCOHOL IS 10 mg/dL FOR MEDICAL PURPOSES ONLY Performed at [facility], [place] [address], [place], [place] [address]   Salicylate level     Status: None   Collection Time: 08/14/17 10:49 PM  Result Value Ref Range   Salicylate Lvl <5.3 2.8 - 30.0 mg/dL    Comment: Performed at [place] [address], [place], [place] [address]  Acetaminophen level     Status: None   Collection Time: 08/14/17 10:49 PM  Result Value Ref Range   Acetaminophen (Tylenol), Serum 17 10 - 30 ug/mL    Comment:        THERAPEUTIC CONCENTRATIONS VARY SIGNIFICANTLY. A RANGE OF 10-30 ug/mL MAY BE AN EFFECTIVE CONCENTRATION FOR MANY PATIENTS. HOWEVER, SOME ARE BEST TREATED AT CONCENTRATIONS OUTSIDE THIS RANGE. ACETAMINOPHEN CONCENTRATIONS >150 ug/mL AT 4 HOURS AFTER INGESTION AND >50 ug/mL AT 12 HOURS AFTER INGESTION ARE OFTEN ASSOCIATED WITH TOXIC REACTIONS. Performed at [facility], [place] [address], [place], [place] [address]     Medications:  Current Facility-Administered Medications  Medication Dose Route Frequency Provider Last Rate Last Dose  . acetaminophen (TYLENOL) tablet 650 mg  650 mg Oral Q6H PRN Charlesetta Shanks, MD   650 mg at 08/16/17 0415  . gabapentin (NEURONTIN) capsule 600 mg  600 mg Oral TID Caccavale, Sophia, PA-C   600 mg at 08/15/17 2200  . loperamide (IMODIUM) capsule 2 mg  2 mg Oral PRN Caccavale, Sophia, PA-C   2 mg at 08/15/17 2045  . mirtazapine (REMERON) tablet 15 mg  15 mg Oral QHS Caccavale, Sophia, PA-C   15 mg at 08/15/17 2200  . oxyCODONE (Oxy IR/ROXICODONE) immediate release tablet 10 mg  10 mg Oral Q4H PRN Caccavale, Sophia, PA-C   10 mg at 08/16/17 8676   Current Outpatient  Medications  Medication Sig Dispense Refill  . gabapentin (NEURONTIN) 600 MG tablet Take 1 tablet (600 mg total) by mouth 3 (three) times daily. 5 tablet 0  . loperamide (IMODIUM) 2 MG capsule Take 1 capsule (2 mg total) by mouth as needed for diarrhea or loose stools. 30 capsule 0  . mirtazapine (REMERON) 15 MG tablet Take 1 tablet (15 mg total) by mouth at bedtime. 30 tablet 0  . Oxycodone HCl 10 MG TABS Take 1 tablet (10 mg total) by mouth every 4 (four) hours as needed. 6 tablet 0  . Multiple Vitamin (MULTIVITAMIN WITH MINERALS) TABS tablet Take 1 tablet by mouth daily. (Patient not taking: Reported on 06/01/2017) 30 tablet 0  . ondansetron (ZOFRAN) 8 MG tablet Take 1 tablet (8 mg total) by mouth every 8 (eight) hours as needed for nausea or vomiting. (Patient not taking: Reported on 08/01/2017) 20 tablet 0   Facility-Administered Medications Ordered in Other Encounters  Medication Dose Route Frequency Provider Last Rate Last Dose  . sodium chloride flush (NS) 0.9 % injection 10 mL  10 mL Intracatheter PRN Nicholas Lose, MD  10 mL at 12/25/16 1702    Musculoskeletal:  Unable to assess via camera  Psychiatric Specialty Exam: Physical Exam  Psychiatric: She has a normal mood and affect. Her speech is normal and behavior is normal. Judgment and thought content normal. Cognition and memory are normal.    ROS  Blood pressure 139/80, pulse (!) 107, temperature 100.3 F (37.9 C), temperature source Oral, resp. rate 18, height 5' 1"  (1.549 m), weight 50.3 kg (111 lb), SpO2 97 %.Body mass index is 20.97 kg/m.  General Appearance: Casual  Eye Contact:  Good  Speech:  Clear and Coherent  Volume:  Normal  Mood:  Euthymic  Affect:  Appropriate  Thought Process:  Coherent and Goal Directed  Orientation:  Full (Time, Place, and Person)  Thought Content:  Logical  Suicidal Thoughts:  No  Homicidal Thoughts:  No  Memory:  Immediate;   Good Recent;   Good Remote;   Good  Judgement:  Good   Insight:  Good  Psychomotor Activity:  Normal  Concentration:  Concentration: Good and Attention Span: Good  Recall:  Good  Fund of Knowledge:  Good  Language:  Good  Akathisia:  No  Handed:  Right  AIMS (if indicated):     Assets:  Communication Skills Desire for Improvement Financial Resources/Insurance Housing Intimacy Leisure Time Physical Health Resilience Social Support  ADL's:  Intact  Cognition:  WNL  Sleep:        Treatment Plan Summary: Plan Discharge to home  Disposition: No evidence of imminent risk to self or others at present.   Patient does not meet criteria for psychiatric inpatient admission. Supportive therapy provided about ongoing stressors. Discussed crisis plan, support from social network, calling 911, coming to the Emergency Department, and calling Suicide Hotline.  This service was provided via telemedicine using a 2-way, interactive audio and video technology.  Names of all persons participating in this telemedicine service and their role in this encounter. Name: Elmarie Shiley  Role: PMHNP-C  Name: [name]  Role: Patient  Name:  Role:   Name:  Role:     Elmarie Shiley, NP 08/16/2017 9:07 AM

## 2017-08-16 NOTE — ED Triage Notes (Signed)
Pt refuses to have DSY changed . Strong odor present in Pt room.

## 2017-08-16 NOTE — ED Triage Notes (Signed)
TTS 08-16-17

## 2017-08-16 NOTE — ED Notes (Signed)
Received call from pts home hospice RN. States that she will see pt tomorrow as planned. Pt made aware that she called.

## 2017-08-16 NOTE — ED Notes (Signed)
Pt has foul smelling wound to left chest area with dressing in place. States that it is her breast cancer. Will not let this RN remove dressing to see wound or change dressing. States that only her hospice RN can do that.

## 2017-08-16 NOTE — ED Triage Notes (Signed)
When Hospice Nurse in room to assess PT. ,Pt reported they do not have my dsy. Supplies and they don't know how to change dsy. There continues to be a strong odor from wound .

## 2017-08-16 NOTE — ED Provider Notes (Signed)
  Physical Exam  BP 139/80 (BP Location: Right Arm)   Pulse (!) 107   Temp 100.3 F (37.9 C) (Oral)   Resp 18   Ht 5\' 1"  (1.549 m)   Wt 50.3 kg (111 lb)   SpO2 97%   BMI 20.97 kg/m   Physical Exam  ED Course/Procedures     Procedures  MDM  Patient is been seen by psychiatry and social work and cleared for discharge.       Davonna Belling, MD 08/16/17 1225

## 2017-08-16 NOTE — ED Triage Notes (Signed)
Pt informed that hospice nurse would not come  Today to change dsy. Pt understands this and does not want Hospital nurse to change dsy .

## 2017-08-16 NOTE — Progress Notes (Signed)
Pt is psychiatrically cleared and is recommended for d/c per Elmarie Shiley, PNP.  This Probation officer called and left a message for pt's Hospice Social Worker, Christena Deem, to advise of status.  Pt's nurse, Elizbeth Squires., RN notified.  Areatha Keas. Judi Cong, MSW, [place] Disposition Clinical Social Work [phone] (cell) [phone] (office)

## 2017-08-16 NOTE — ED Triage Notes (Signed)
PT has cab voucher for transport home

## 2017-08-16 NOTE — Progress Notes (Signed)
[facility]) - RN Visit  Checked in with Ann Oliver, Bedside RN to get update on patient who stated that patient was pending for possible discharge today.  She also stated that patient had a good night but was refusing dressing change this morning.    Spoke to patient who was Alert/Oriented sitting up in bed verbalizing that she was "feeling perky and her appetite has returned"  with complaints of "discomfort" in Left arm. Patient stated she could have her pain medication at 10am and wanted to know the time now. She also stated that she would like to go home since she can pick up her new prescription today and she had come to the ER because she ran out of her medications at home and was in a lot of pain over the last 2 weeks.  Patient admitted she takes medications when she feels pain which is about every 4 hours at home.   Writer encouraged patient to let ER RN change Left Chest dressing due to the strong foul odor coming from patient which patient continues to refuse wanting to have Hospice RN and also continued medication education which has been ongoing in the home.    [facility] will continue to follow patient while in the hospital. Please call with any Hospice related concerns or questions.   Gar Ponto, RN [facility] Liaison [place] now found on [facility]

## 2017-08-23 ENCOUNTER — Emergency Department (HOSPITAL_COMMUNITY)
Admission: EM | Admit: 2017-08-23 | Discharge: 2017-08-23 | Disposition: A | Discharge status: Home / Self Care | Source: Home / Self Care | Attending: Emergency Medicine | Admitting: Emergency Medicine

## 2017-08-23 ENCOUNTER — Other Ambulatory Visit: Payer: Self-pay

## 2017-08-23 ENCOUNTER — Emergency Department (HOSPITAL_COMMUNITY)

## 2017-08-23 ENCOUNTER — Encounter (HOSPITAL_COMMUNITY): Payer: Self-pay | Admitting: Emergency Medicine

## 2017-08-23 ENCOUNTER — Inpatient Hospital Stay (HOSPITAL_COMMUNITY)
Admission: EM | Admit: 2017-08-23 | Discharge: 2017-08-26 | DRG: 864 | Disposition: A | Discharge status: Home / Self Care | Attending: Internal Medicine | Admitting: Internal Medicine

## 2017-08-23 ENCOUNTER — Encounter (HOSPITAL_COMMUNITY): Payer: Self-pay | Admitting: *Deleted

## 2017-08-23 DIAGNOSIS — D72829 Elevated white blood cell count, unspecified: Secondary | ICD-10-CM | POA: Diagnosis present

## 2017-08-23 DIAGNOSIS — R509 Fever, unspecified: Principal | ICD-10-CM

## 2017-08-23 DIAGNOSIS — Z79899 Other long term (current) drug therapy: Secondary | ICD-10-CM

## 2017-08-23 DIAGNOSIS — C50919 Malignant neoplasm of unspecified site of unspecified female breast: Secondary | ICD-10-CM | POA: Diagnosis present

## 2017-08-23 DIAGNOSIS — R64 Cachexia: Secondary | ICD-10-CM | POA: Diagnosis present

## 2017-08-23 DIAGNOSIS — G894 Chronic pain syndrome: Secondary | ICD-10-CM

## 2017-08-23 DIAGNOSIS — Z9114 Patient's other noncompliance with medication regimen: Secondary | ICD-10-CM

## 2017-08-23 DIAGNOSIS — R45851 Suicidal ideations: Secondary | ICD-10-CM | POA: Diagnosis not present

## 2017-08-23 DIAGNOSIS — Z515 Encounter for palliative care: Secondary | ICD-10-CM | POA: Diagnosis present

## 2017-08-23 DIAGNOSIS — Z853 Personal history of malignant neoplasm of breast: Secondary | ICD-10-CM | POA: Diagnosis not present

## 2017-08-23 DIAGNOSIS — F329 Major depressive disorder, single episode, unspecified: Secondary | ICD-10-CM | POA: Diagnosis present

## 2017-08-23 DIAGNOSIS — Z66 Do not resuscitate: Secondary | ICD-10-CM | POA: Diagnosis present

## 2017-08-23 DIAGNOSIS — D638 Anemia in other chronic diseases classified elsewhere: Secondary | ICD-10-CM | POA: Diagnosis present

## 2017-08-23 DIAGNOSIS — Z008 Encounter for other general examination: Secondary | ICD-10-CM | POA: Diagnosis not present

## 2017-08-23 DIAGNOSIS — Z79891 Long term (current) use of opiate analgesic: Secondary | ICD-10-CM

## 2017-08-23 DIAGNOSIS — N644 Mastodynia: Secondary | ICD-10-CM | POA: Diagnosis present

## 2017-08-23 DIAGNOSIS — Z886 Allergy status to analgesic agent status: Secondary | ICD-10-CM

## 2017-08-23 DIAGNOSIS — G893 Neoplasm related pain (acute) (chronic): Secondary | ICD-10-CM | POA: Diagnosis present

## 2017-08-23 DIAGNOSIS — Z6821 Body mass index (BMI) 21.0-21.9, adult: Secondary | ICD-10-CM

## 2017-08-23 DIAGNOSIS — C7800 Secondary malignant neoplasm of unspecified lung: Secondary | ICD-10-CM | POA: Diagnosis present

## 2017-08-23 LAB — BASIC METABOLIC PANEL
Anion gap: 12 (ref 5–15)
BUN: 19 mg/dL (ref 6–20)
CHLORIDE: 101 mmol/L (ref 101–111)
CO2: 20 mmol/L — AB (ref 22–32)
CREATININE: 0.88 mg/dL (ref 0.44–1.00)
Calcium: 9 mg/dL (ref 8.9–10.3)
GFR calc non Af Amer: >60 mL/min (ref >60)
Glucose, Bld: 115 mg/dL — ABNORMAL HIGH (ref 65–99)
POTASSIUM: 3.9 mmol/L (ref 3.5–5.1)
SODIUM: 133 mmol/L — AB (ref 135–145)

## 2017-08-23 LAB — COMPREHENSIVE METABOLIC PANEL
ALK PHOS: 356 U/L — AB (ref 38–126)
ALT: 37 U/L (ref 14–54)
AST: 21 U/L (ref 15–41)
Albumin: 3 g/dL — ABNORMAL LOW (ref 3.5–5.0)
Anion gap: 9 (ref 5–15)
BILIRUBIN TOTAL: 0.2 mg/dL — AB (ref 0.3–1.2)
BUN: 22 mg/dL — ABNORMAL HIGH (ref 6–20)
CALCIUM: 9.3 mg/dL (ref 8.9–10.3)
CO2: 24 mmol/L (ref 22–32)
CREATININE: 0.69 mg/dL (ref 0.44–1.00)
Chloride: 106 mmol/L (ref 101–111)
GFR calc Af Amer: >60 mL/min (ref >60)
GLUCOSE: 104 mg/dL — AB (ref 65–99)
Potassium: 4.5 mmol/L (ref 3.5–5.1)
Sodium: 139 mmol/L (ref 135–145)
TOTAL PROTEIN: 7.6 g/dL (ref 6.5–8.1)

## 2017-08-23 LAB — CBC
HCT: 29.6 % — ABNORMAL LOW (ref 36.0–46.0)
HEMATOCRIT: 29.7 % — AB (ref 36.0–46.0)
Hemoglobin: 8.8 g/dL — ABNORMAL LOW (ref 12.0–15.0)
Hemoglobin: 8.6 g/dL — ABNORMAL LOW (ref 12.0–15.0)
MCH: 23 pg — AB (ref 26.0–34.0)
MCH: 22.6 pg — AB (ref 26.0–34.0)
MCHC: 29.6 g/dL — ABNORMAL LOW (ref 30.0–36.0)
MCHC: 29.1 g/dL — AB (ref 30.0–36.0)
MCV: 77.5 fL — AB (ref 78.0–100.0)
MCV: 77.7 fL — AB (ref 78.0–100.0)
PLATELETS: 800 10*3/uL — AB (ref 150–400)
PLATELETS: 842 10*3/uL — AB (ref 150–400)
RBC: 3.83 MIL/uL — AB (ref 3.87–5.11)
RBC: 3.81 MIL/uL — ABNORMAL LOW (ref 3.87–5.11)
RDW: 17.9 % — ABNORMAL HIGH (ref 11.5–15.5)
RDW: 17.7 % — ABNORMAL HIGH (ref 11.5–15.5)
WBC: 16.7 10*3/uL — ABNORMAL HIGH (ref 4.0–10.5)
WBC: 16.3 10*3/uL — ABNORMAL HIGH (ref 4.0–10.5)

## 2017-08-23 LAB — ACETAMINOPHEN LEVEL: Acetaminophen (Tylenol), Serum: 12 ug/mL (ref 10–30)

## 2017-08-23 LAB — RAPID URINE DRUG SCREEN, HOSP PERFORMED
Amphetamines: NOT DETECTED
BARBITURATES: NOT DETECTED
BENZODIAZEPINES: NOT DETECTED
Cocaine: NOT DETECTED
Opiates: NOT DETECTED
Tetrahydrocannabinol: NOT DETECTED

## 2017-08-23 LAB — ETHANOL

## 2017-08-23 LAB — TROPONIN I: Troponin I: <0.03 ng/mL (ref <0.03)

## 2017-08-23 LAB — SALICYLATE LEVEL: Salicylate Lvl: <7 mg/dL (ref 2.8–30.0)

## 2017-08-23 MED ORDER — MIRTAZAPINE 15 MG PO TABS
15.0000 mg | ORAL_TABLET | Freq: Every day | ORAL | Status: DC
  Administered 2017-08-23 – 2017-08-25 (×3): 15 mg via ORAL
  Filled 2017-08-23 (×3): qty 1

## 2017-08-23 MED ORDER — ACETAMINOPHEN 325 MG PO TABS
325.0000 mg | ORAL_TABLET | Freq: Once | ORAL | Status: AC
  Administered 2017-08-23: 325 mg via ORAL
  Filled 2017-08-23: qty 1

## 2017-08-23 MED ORDER — OXYCODONE HCL 5 MG PO TABS
10.0000 mg | ORAL_TABLET | ORAL | Status: DC | PRN
  Administered 2017-08-23 – 2017-08-24 (×4): 10 mg via ORAL
  Filled 2017-08-23 (×4): qty 2

## 2017-08-23 MED ORDER — OXYCODONE HCL 5 MG PO TABS
10.0000 mg | ORAL_TABLET | Freq: Once | ORAL | Status: AC
  Administered 2017-08-23: 10 mg via ORAL
  Filled 2017-08-23: qty 2

## 2017-08-23 MED ORDER — GABAPENTIN 300 MG PO CAPS
600.0000 mg | ORAL_CAPSULE | Freq: Three times a day (TID) | ORAL | Status: DC
  Administered 2017-08-23 – 2017-08-26 (×9): 600 mg via ORAL
  Filled 2017-08-23 (×9): qty 2

## 2017-08-23 NOTE — ED Notes (Addendum)
Patient continually asking for pain medication.  She reports the pain is overall and she is rating it as a 10.  Provider notified.

## 2017-08-23 NOTE — ED Notes (Signed)
Patient wanded by security. 

## 2017-08-23 NOTE — ED Notes (Signed)
Patient reports she wants to go home if she is not going to get any pain medication.

## 2017-08-23 NOTE — Progress Notes (Signed)
[facility] [place] ([facility]) - RN visit  Checked in with bedside RN to get report on patient.  Made [facility] RN aware of last week of home hospice services with patient, informing her that patient has been seen and followed by [facility] throughout past week regarding pain management and education at home. Patient had 60 pills of Oxycodone and 60 pill of Gabapentin refilled on 08/16/17 after discharge from [facility] ER and as of this weekend has none left. Next medication refill is scheduled for 08/30/17.  Medication education has been ongoing with patient who lives alone and seems to have had issues being compliant with administration schedule.   Per [facility] RN Irma Newness MD had visited patient this afternoon, before coming to [facility] ER, to discuss medication changes to help patient with pain and compliance.    Visited patient in room who was standing at bedside holding stomach/moving gingerly and asking to go home. Patient complains of all over pain of 10 on a scale of 10 and is asking for medication which bedside RN made aware. Encouraged patient to get back in bed and she is sitting at bedside at this time.  Patient has a Engineer, materials since stating she was suicidal. Informed patient that [facility] would continue to follow her while in hospital and to call as needed.  Bedside RN given contact information for [facility] RN and asked to notify [facility] if patient admitted to or discharged from hospital.  [facility] SW and [facility] RN are aware of the above.  Please call [facility] with any hospice related questions or concerns.  Thank you,  Gar Ponto, [facility] Liaison [place] now found on [facility]

## 2017-08-23 NOTE — ED Notes (Signed)
Bed: [facility]3 Expected date:  Expected time:  Means of arrival:  Comments: 

## 2017-08-23 NOTE — ED Notes (Signed)
Pt took Taxi to [employer] to get something to eat and then returned.

## 2017-08-23 NOTE — BH Assessment (Addendum)
[facility] Assessment Progress Note  Case was staffed with Romilda Garret FNP who recommended patient be monitored and observed for safety. Romilda Garret FNP also recommends patient be monitored for ongoing medical concerns.Patient will be seen by psychiatry in the a.m.

## 2017-08-23 NOTE — ED Notes (Signed)
Pt stable, ambulatory, and verbalizes understanding of d/c instructions.  

## 2017-08-23 NOTE — ED Triage Notes (Signed)
The pt is c/o generalized chest pain with sob for 3-7 days  No nausea

## 2017-08-23 NOTE — ED Notes (Signed)
Patient talking to TTS 

## 2017-08-23 NOTE — BH Assessment (Addendum)
Assessment Note  Ann Oliver is an 62 y.o. female that presents this date with thoughts of self harm although denies any plan or intent. Patient denies any H/I or AVH although this writer is uncertain if patient is processing the content of this writer's questions. Patient is time/place oriented and speaks in a low voice that is inaudible at times. Patient is unable to render her psychiatric symptoms but does nod her head "yes" when asked if she had thoughts of self harm. Patient displays some active thought blocking and this writer is unsure if patient is actively impaired. Patient is difficult to redirect and finds it difficult to interact with this Probation officer. Patient is a poor historian and renders limited history. Patient continues to elaborate on her pain and believes this writer to be a provider who can assist her with pain management issues. This writer continues to explain that TTS is attempting to assess her current mental state associated with her S/I and evaluate for her safety. Patient continues to find it difficult to comprehend the questions associated with the assessment. Information to complete assessment was obtained from admission notes and prior history. Per notes, patient is brought in by EMS from her residence. Patient has a history of  breast cancer. Patient reports she ran out of oxycodone last week and pain has worsened. Patient was seen at [facility] this morning and discharged for same. Patient reports she was given pain medicine there and when it wore off she became suicidal again due to pain. Per hospice, due to patient abusing medication, she is provided with a two week supply at a time. Per hospice, patient was given medication refills with 60 gabapentin and 60 oxycodone on 3/25. 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 Patient filled her medication on March 25th and is already out of the medications. Next prescription  due to be filled on April 8th. Hospice reports patient has been educated multiple times about how to take her medications, but continues to tell staff, she will take them when she needs them and not on any particular schedule. Case was staffed with Romilda Garret FNP who recommended patient be monitored and observed for safety. Romilda Garret FNP also recommends patient be monitored for ongoing medical concerns. Patient will continue in TCU and will be seen by psychiatry in the a.m.      Diagnosis: Deferred  Past Medical History:  Past Medical History:  Diagnosis Date  . Breast cancer (HCC)    METS TO LUNGS  . Sinusitis   . Tylenol overdose 05/2017    Past Surgical History:  Procedure Laterality Date  . BIOPSY BREAST    . IR FLUORO GUIDE CV LINE RIGHT  11/06/2016  . IR US GUIDE VASC ACCESS RIGHT  11/06/2016    Family History:  Family History  Family history unknown: Yes    Social History:  reports that she has never smoked. She has never used smokeless tobacco. She reports that she does not drink alcohol or use drugs.  Additional Social History:  Alcohol / Drug Use Pain Medications: See MAR Prescriptions: See MAR Over the Counter: See MAR History of alcohol / drug use?: Yes Longest period of sobriety (when/how long): Unknown Negative Consequences of Use: (Denies) Withdrawal Symptoms: (Denies) Substance #1 Name of Substance 1: Opiates 1 - Age of First Use: Unknown 1 - Amount (size/oz): Unknown 1 - Frequency: Unknown 1 - Duration: Unknown 1 - Last Use / Amount: 08/23/17  CIWA: CIWA-Ar BP: 126/77 Pulse Rate: 96 COWS:    Allergies:  Allergies  Allergen Reactions  . Aspirin Palpitations    Home Medications:  (Not in a hospital admission)  OB/GYN Status:  No LMP recorded. Patient is postmenopausal.  General Assessment Data Location of Assessment: [facility] TTS Assessment: In system Is this a Tele or Face-to-Face Assessment?: Face-to-Face Is this an Initial Assessment or a  Re-assessment for this encounter?: Initial Assessment Marital status: Single Maiden name: NA Is patient pregnant?: No Pregnancy Status: No Living Arrangements: Alone Can pt return to current living arrangement?: Yes Admission Status: Voluntary Is patient capable of signing voluntary admission?: Yes Referral Source: Self/Family/Friend Insurance type: Medicaid  Medical Screening Exam ([place]) Medical Exam completed: Yes  Crisis Care Plan Living Arrangements: Alone Legal Guardian: (NA) Name of Psychiatrist: None Name of Therapist: None  Education Status Is patient currently in school?: No Is the patient employed, unemployed or receiving disability?: Receiving disability income  Risk to self with the past 6 months Suicidal Ideation: Yes-Currently Present Has patient been a risk to self within the past 6 months prior to admission? : Yes Suicidal Intent: No Has patient had any suicidal intent within the past 6 months prior to admission? : Yes Is patient at risk for suicide?: Yes Suicidal Plan?: No Has patient had any suicidal plan within the past 6 months prior to admission? : Yes Access to Means: No What has been your use of drugs/alcohol within the last 12 months?: Current use of opiates Previous Attempts/Gestures: No How many times?: 0 Other Self Harm Risks: NA Triggers for Past Attempts: Other (Comment)(Pain management) Intentional Self Injurious Behavior: None Family Suicide History: No Recent stressful life event(s): Other (Comment)(Illness chronic pain management) Persecutory voices/beliefs?: No Depression: Yes Depression Symptoms: (pt cannot identify symptoms) Substance abuse history and/or treatment for substance abuse?: No Suicide prevention information given to non-admitted patients: Not applicable  Risk to Others within the past 6 months Homicidal Ideation: No Does patient have any lifetime risk of violence toward others beyond the six months prior to  admission? : No Thoughts of Harm to Others: No Current Homicidal Intent: No Current Homicidal Plan: No Access to Homicidal Means: No Identified Victim: NA History of harm to others?: No Assessment of Violence: None Noted Violent Behavior Description: NA Does patient have access to weapons?: No Criminal Charges Pending?: No Does patient have a court date: No Is patient on probation?: No  Psychosis Hallucinations: None noted Delusions: None noted  Mental Status Report Appearance/Hygiene: Unremarkable Eye Contact: Poor Motor Activity: Unsteady Speech: Soft, Slow Level of Consciousness: Drowsy Mood: Depressed Affect: Sad Anxiety Level: Moderate Thought Processes: Coherent Judgement: Impaired Orientation: Person, Place, Time Obsessive Compulsive Thoughts/Behaviors: None  Cognitive Functioning Concentration: Poor Memory: Remote Impaired Is patient IDD: No Is patient DD?: No Insight: Poor Impulse Control: Poor Appetite: Fair Have you had any weight changes? : No Change Sleep: No Change Total Hours of Sleep: (Pt states 2 to 4 hours nightly) Vegetative Symptoms: None  ADLScreening [facility] Assessment Services) Patient's cognitive ability adequate to safely complete daily activities?: Yes Patient able to express need for assistance with ADLs?: Yes Independently performs ADLs?: Yes (appropriate for developmental age)  Prior Inpatient Therapy Prior Inpatient Therapy: Yes Prior Therapy Dates: 2018 Prior Therapy Facilty/Provider(s): (Unknown) Reason for Treatment: (Pain management med abuse)  Prior Outpatient Therapy Prior Outpatient Therapy: No Does patient have an ACCT team?: No Does patient have Intensive In-House Services?  : No Does patient have [employer]  services? : No Does patient have P4CC services?: No  ADL Screening (condition at time of admission) Patient's cognitive ability adequate to safely complete daily activities?: Yes Is the patient deaf or have  difficulty hearing?: No Does the patient have difficulty seeing, even when wearing glasses/contacts?: No Does the patient have difficulty concentrating, remembering, or making decisions?: Yes Patient able to express need for assistance with ADLs?: Yes Does the patient have difficulty dressing or bathing?: No Independently performs ADLs?: Yes (appropriate for developmental age) Does the patient have difficulty walking or climbing stairs?: Yes Weakness of Legs: Both Weakness of Arms/Hands: Both  Home Assistive Devices/Equipment Home Assistive Devices/Equipment: None  Therapy Consults (therapy consults require a physician order) PT Evaluation Needed: No OT Evalulation Needed: No SLP Evaluation Needed: No Abuse/Neglect Assessment (Assessment to be complete while patient is alone) Physical Abuse: Denies Verbal Abuse: Denies Sexual Abuse: Denies Exploitation of patient/patient's resources: Denies Self-Neglect: Denies Values / Beliefs Cultural Requests During Hospitalization: None Spiritual Requests During Hospitalization: None Consults Spiritual Care Consult Needed: No Social Work Consult Needed: No Regulatory affairs officer (For Healthcare) Does Patient Have a Medical Advance Directive?: No Would patient like information on creating a medical advance directive?: No - Patient declined    Additional Information 1:1 In Past 12 Months?: No CIRT Risk: No Elopement Risk: No Does patient have medical clearance?: Yes     Disposition: Case was staffed with Romilda Garret FNP who recommended patient be monitored and observed for safety. Romilda Garret FNP also recommends patient be evaluated for a possible medical admission. This Probation officer informed EDP who will discuss with attending provider. If not medically admitted patient will continue in TCU and will be seen by psychiatry in the a.m.    Disposition Initial Assessment Completed for this Encounter: Yes Disposition of Patient: (Observe and monitor) Patient  refused recommended treatment: No Mode of transportation if patient is discharged?: (Unknown)  On Site Evaluation by:   Reviewed with Physician:    Mamie Nick 08/23/2017 5:33 PM

## 2017-08-23 NOTE — ED Provider Notes (Signed)
[facility] EMERGENCY DEPARTMENT Provider Note   CSN: [id number] Arrival date & time: 08/23/17  0235     History   Chief Complaint Chief Complaint  Patient presents with  . Chest Pain    HPI Ann Oliver is a 62 y.o. female.  Complains of anterior chest pain for 1 year.  Pain is constant.  Not made better or worse by anything she denies any shortness of breath denies cough.  No other associated symptoms.  She states she ran out of her medicine.  Patient was was prescribed oxycodone 10 mg 60 tablets, 15-day supply on 08/16/2017 [facility] Controlled Substance reporting System queried.  She does report depression but denies feeling suicidal presently.  She states that she is not taking her medicine properly.  HPI  Past Medical History:  Diagnosis Date  . Breast cancer (HCC)    METS TO LUNGS  . Sinusitis   . Tylenol overdose 05/2017    Patient Active Problem List   Diagnosis Date Noted  . MDD (major depressive disorder), recurrent episode, moderate ([place]) 06/03/2017  . Suicidal ideation 06/02/2017  . Tylenol overdose 06/01/2017  . Breast mass   . Acute blood loss anemia   . Hemorrhage from wound   . Ascites   . Evaluation by psychiatric service required 04/22/2017  . AKI (acute kidney injury) ([place]) 03/26/2017  . Leg swelling 03/26/2017  . Diarrhea 03/26/2017  . Leukocytosis 03/26/2017  . Breast wound 03/18/2017  . Blood loss anemia 01/18/2017  . Port catheter in place 12/02/2016  . Necrotizing soft tissue infection 11/21/2016  . Wound infection 10/28/2016  . Cellulitis of left breast 10/28/2016  . Encounter for palliative care   . Goals of care, counseling/discussion   . Breast cancer metastasized to lung, right ([place]) 09/25/2016  . Malnutrition of moderate degree 09/25/2016  . Breast cancer of upper-outer quadrant of left female breast ([place]) 10/22/2015  . THYROID NODULE, RIGHT 05/31/2007  . Depressed 05/31/2007  . GERD 05/31/2007  . HOT  FLASHES 05/31/2007  . HEADACHE 05/31/2007    Past Surgical History:  Procedure Laterality Date  . BIOPSY BREAST    . IR FLUORO GUIDE CV LINE RIGHT  11/06/2016  . IR US GUIDE VASC ACCESS RIGHT  11/06/2016     OB History   None      Home Medications    Prior to Admission medications   Medication Sig Start Date End Date Taking? Authorizing Provider  gabapentin (NEURONTIN) 600 MG tablet Take 1 tablet (600 mg total) by mouth 3 (three) times daily. 08/14/17   Caccavale, Sophia, PA-C  loperamide (IMODIUM) 2 MG capsule Take 1 capsule (2 mg total) by mouth as needed for diarrhea or loose stools. 05/28/17   Nicholas Lose, MD  mirtazapine (REMERON) 15 MG tablet Take 1 tablet (15 mg total) by mouth at bedtime. 08/09/17   Nicholas Lose, MD  Multiple Vitamin (MULTIVITAMIN WITH MINERALS) TABS tablet Take 1 tablet by mouth daily. Patient not taking: Reported on 06/01/2017 05/07/17   Bonnielee Haff, MD  ondansetron (ZOFRAN) 8 MG tablet Take 1 tablet (8 mg total) by mouth every 8 (eight) hours as needed for nausea or vomiting. Patient not taking: Reported on 08/01/2017 05/07/17   Bonnielee Haff, MD  Oxycodone HCl 10 MG TABS Take 1 tablet (10 mg total) by mouth every 4 (four) hours as needed. 08/14/17   Caccavale, Sophia, PA-C    Family History Family History  Family history unknown: Yes    Social History  Social History   Tobacco Use  . Smoking status: Never Smoker  . Smokeless tobacco: Never Used  Substance Use Topics  . Alcohol use: No  . Drug use: No   Hospice patient  Allergies   Aspirin   Review of Systems Review of Systems  Cardiovascular: Positive for chest pain.  Allergic/Immunologic: Positive for immunocompromised state.  Psychiatric/Behavioral: Positive for dysphoric mood.  All other systems reviewed and are negative.    Physical Exam Updated Vital Signs BP 128/85 (BP Location: Right Arm)   Pulse 95   Temp 98.5 F (36.9 C)   Resp 20   Ht 5\' 1"  (1.549 m)   Wt 52.2  kg (115 lb)   SpO2 100%   BMI 21.73 kg/m   Physical Exam  Constitutional: She is oriented to person, place, and time.  Frail, chronically ill-appearing  HENT:  Head: Normocephalic and atraumatic.  Eyes: Pupils are equal, round, and reactive to light. Conjunctivae are normal.  Neck: Neck supple. No tracheal deviation present. No thyromegaly present.  Cardiovascular: Normal rate and regular rhythm.  No murmur heard. Pulmonary/Chest: Effort normal and breath sounds normal.   chest is wrapped in an Ace wrap.  She refused to allow me to remove the Ace wrap for further examination  anterior chest wall is tender  Abdominal: Soft. Bowel sounds are normal. She exhibits no distension. There is no tenderness.  Musculoskeletal: Normal range of motion. She exhibits no edema or tenderness.  Neurological: She is alert and oriented to person, place, and time. Coordination normal.  Skin: Skin is warm and dry. No rash noted.  Unable to examine chest  skin otherwise unremarkable   Psychiatric:  Depressed affect  Nursing note and vitals reviewed.    ED Treatments / Results  Labs (all labs ordered are listed, but only abnormal results are displayed) Labs Reviewed  BASIC METABOLIC PANEL - Abnormal; Notable for the following components:      Result Value   Sodium 133 (*)    CO2 20 (*)    Glucose, Bld 115 (*)    All other components within normal limits  CBC - Abnormal; Notable for the following components:   WBC 16.7 (*)    RBC 3.83 (*)    Hemoglobin 8.8 (*)    HCT 29.7 (*)    MCV 77.5 (*)    MCH 23.0 (*)    MCHC 29.6 (*)    RDW 17.9 (*)    Platelets 800 (*)    All other components within normal limits  TROPONIN I    EKG EKG Interpretation  Date/Time:  Monday August 23 2017 02:47:06 EDT Ventricular Rate:  97 PR Interval:  116 QRS Duration: 60 QT Interval:  314 QTC Calculation: 398 R Axis:   66 Text Interpretation:  Normal sinus rhythm Low voltage QRS Nonspecific T wave  abnormality Abnormal ECG No significant change since last tracing Confirmed by Orlie Dakin [phone]) on 08/23/2017 7:21:21 AM   Radiology Dg Chest 2 View  Result Date: 08/23/2017 CLINICAL DATA:  Chest pain for 3-7 days of dyspnea. EXAM: CHEST - 2 VIEW COMPARISON:  05/12/2017 FINDINGS: The heart size and mediastinal contours are within normal limits. Mild aortic atherosclerosis at the arch without aneurysm. Interval progression of metastatic pulmonary disease with increase in size of bilateral pulmonary masslike opacities. Dressing projects over the lateral aspect of the left hemithorax were there is asymmetric soft tissue prominence relative to the right chest wall. No aggressive osseous lesions identified. IMPRESSION: 1. Interval  increase in size of several bilateral pulmonary masslike opacities compatible with worsening metastatic disease. 2. Dressing overlies the left lateral aspect of the chest wall with asymmetric soft tissue prominence of the left lateral chest wall relative to right. Electronically Signed   By: Ashley Royalty M.D.   On: 08/23/2017 03:54    Procedures Procedures (including critical care time)  Medications Ordered in ED Medications  oxyCODONE (Oxy IR/ROXICODONE) immediate release tablet 10 mg (has no administration in time range)    Chest x-ray viewed by me  Initial Impression / Assessment and Plan / ED Course  I have reviewed the triage vital signs and the nursing notes.  Pertinent labs & imaging results that were available during my care of the patient were reviewed by me and considered in my medical decision making (see chart for details).     Patient to be given oxycodone 10 mg here.  She was given a meal here.  I discussed case with hospice nurse Coralee Pesa.  Personnel from hospice and [facility] of [facility] will visit patient at home this morning  Final Clinical Impressions(s) / ED Diagnoses  Diagnosis#1 chronic pain #2 metastatic cancer Final  diagnoses:  None    ED Discharge Orders    None       Orlie Dakin, MD 08/23/17 [phone]

## 2017-08-23 NOTE — ED Notes (Signed)
Report received from [facility].

## 2017-08-23 NOTE — ED Notes (Signed)
Hospice nurse stopped by and told nurse that patient has been abusing her pain medications and has a history of overdose.  Patient filled her medication on March 25th and is already out of the meds.  Next prescription due to be filled on April 8th.  Hospice reports patient has been educated multiple times about how to take her meds, but continues to tell staff, she will take them when she needs them and not on any particular schedule.  Hospice nurse talking to PA now.

## 2017-08-23 NOTE — ED Notes (Signed)
Due to patient being suicidal, belongings taken out of room and placed in cabinet.  Belongings: black and white polka dot hard case, travel size bottle of acetaminophen, 1 green card, $1 bill, 1 quarter, 1 dime, 4 pennies, bus receipt, and partial roll of cloth tape.

## 2017-08-23 NOTE — ED Provider Notes (Signed)
[facility] DEPT Provider Note   CSN: [id number] Arrival date & time: 08/23/17  1343     History   Chief Complaint Chief Complaint  Patient presents with  . Suicidal  . Generalized Body Aches    HPI Ann Oliver is a 62 y.o. female with past medical history of breast cancer currently on hospice who presents the emergency department today for suicidal ideation.  I spoke with patient's hospice nurse Gar Ponto reports that she has been under hospice care since January 9.  They report that she has been noncompliant with medication.  They state that she tells them during visits that she does not care about their schedule she will take her medications whenever she is in pain as she feels necessary.  The patient frequently misses her appointments.  She did recently have a refill of her gabapentin and oxycodone 10mg   including 60 pills of each on Monday, 08/16/2017.  The patient started calling the office telling them that she was out of all of her medications on Thursday, 08/19/2017.  A nurse went out to visit the house and found that she only had 3 gabapentin left and no oxycodone.  A social worker attempted to see the patient on Friday but was refused down to the patient's house.  The patient presents the emergency permit today complaining of her chronic breast pain.  She had a reassuring EKG and negative troponin.  The patient was discharged home.  She had a outpatient visit by her hospice doctor at noon today.  She was told that she will not have refills of her medications until April 8.  After the visit the patient called EMS and reported that she was suicidal.  To myself she states that she is now suicidal because she is in so much pain and no one will help her.  Does not have a plan.  She has not attempted this.  She states that she has taken 10 mg of oxycodone today for which she got in the emergency department during her earlier visit.  No other medication use  today.  She states she continues to have chronic breast pain.  She denies any chest pain, shortness of breath, nausea, diaphoresis, hemoptysis, cough, lower leg swelling.  She states this is her chronic pain and has not change from baseline.  It is worse with palpation.  She denies any alcohol use.  No homicidal ideation.  No other complaints at this time.  HPI  Past Medical History:  Diagnosis Date  . Breast cancer (HCC)    METS TO LUNGS  . Sinusitis   . Tylenol overdose 05/2017    Patient Active Problem List   Diagnosis Date Noted  . MDD (major depressive disorder), recurrent episode, moderate ([place]) 06/03/2017  . Suicidal ideation 06/02/2017  . Tylenol overdose 06/01/2017  . Breast mass   . Acute blood loss anemia   . Hemorrhage from wound   . Ascites   . Evaluation by psychiatric service required 04/22/2017  . AKI (acute kidney injury) ([place]) 03/26/2017  . Leg swelling 03/26/2017  . Diarrhea 03/26/2017  . Leukocytosis 03/26/2017  . Breast wound 03/18/2017  . Blood loss anemia 01/18/2017  . Port catheter in place 12/02/2016  . Necrotizing soft tissue infection 11/21/2016  . Wound infection 10/28/2016  . Cellulitis of left breast 10/28/2016  . Encounter for palliative care   . Goals of care, counseling/discussion   . 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 Malnutrition  of moderate degree 09/25/2016  . Breast cancer of upper-outer quadrant of left female breast ([place]) 10/22/2015  . THYROID NODULE, RIGHT 05/31/2007  . Depressed 05/31/2007  . GERD 05/31/2007  . HOT FLASHES 05/31/2007  . HEADACHE 05/31/2007    Past Surgical History:  Procedure Laterality Date  . BIOPSY BREAST    . IR FLUORO GUIDE CV LINE RIGHT  11/06/2016  . IR US GUIDE VASC ACCESS RIGHT  11/06/2016     OB History   None      Home Medications    Prior to Admission medications   Medication Sig Start Date End Date Taking? Authorizing Provider  gabapentin (NEURONTIN) 600 MG  tablet Take 1 tablet (600 mg total) by mouth 3 (three) times daily. 08/14/17   Caccavale, Sophia, PA-C  loperamide (IMODIUM) 2 MG capsule Take 1 capsule (2 mg total) by mouth as needed for diarrhea or loose stools. 05/28/17   Nicholas Lose, MD  mirtazapine (REMERON) 15 MG tablet Take 1 tablet (15 mg total) by mouth at bedtime. 08/09/17   Nicholas Lose, MD  Multiple Vitamin (MULTIVITAMIN WITH MINERALS) TABS tablet Take 1 tablet by mouth daily. Patient not taking: Reported on 06/01/2017 05/07/17   Bonnielee Haff, MD  ondansetron (ZOFRAN) 8 MG tablet Take 1 tablet (8 mg total) by mouth every 8 (eight) hours as needed for nausea or vomiting. Patient not taking: Reported on 08/01/2017 05/07/17   Bonnielee Haff, MD  Oxycodone HCl 10 MG TABS Take 1 tablet (10 mg total) by mouth every 4 (four) hours as needed. 08/14/17   Caccavale, Sophia, PA-C    Family History Family History  Family history unknown: Yes    Social History Social History   Tobacco Use  . Smoking status: Never Smoker  . Smokeless tobacco: Never Used  Substance Use Topics  . Alcohol use: No  . Drug use: No     Allergies   Aspirin   Review of Systems Review of Systems  All other systems reviewed and are negative.    Physical Exam Updated Vital Signs BP 126/77 (BP Location: Right Arm)   Pulse 96   Temp 98.4 F (36.9 C) (Oral)   Resp 16   SpO2 100%   Physical Exam  Constitutional: She appears well-nourished.  Appears older than stated age, chronically frail-appearing.  HENT:  Head: Normocephalic and atraumatic.  Right Ear: External ear normal.  Left Ear: External ear normal.  Nose: Nose normal.  Mouth/Throat: Uvula is midline, oropharynx is clear and moist and mucous membranes are normal. No tonsillar exudate.  Eyes: Pupils are equal, round, and reactive to light. Right eye exhibits no discharge. Left eye exhibits no discharge. No scleral icterus.  Neck: Trachea normal. Neck supple. No spinous process tenderness  present. No neck rigidity. Normal range of motion present.  Cardiovascular: Normal rate, regular rhythm and intact distal pulses.  No murmur heard. Pulses:      Radial pulses are 2+ on the right side, and 2+ on the left side.       Dorsalis pedis pulses are 2+ on the right side, and 2+ on the left side.       Posterior tibial pulses are 2+ on the right side, and 2+ on the left side.  No lower extremity swelling or edema. Calves symmetric in size bilaterally.  Pulmonary/Chest: Effort normal and breath sounds normal. She exhibits tenderness (along chest wall).  Chest wrapped in Ace wrap.  She refused to let me examine this or remove the Ace  wrap  Abdominal: Soft. Bowel sounds are normal. She exhibits no distension. There is no tenderness. There is no rigidity, no rebound, no guarding and no CVA tenderness.  Musculoskeletal: She exhibits no edema.  Lymphadenopathy:    She has no cervical adenopathy.  Neurological: She is alert.  Skin: Skin is warm and dry. No rash noted. She is not diaphoretic.  Psychiatric: She has a normal mood and affect.  Nursing note and vitals reviewed.    ED Treatments / Results  Labs (all labs ordered are listed, but only abnormal results are displayed) Labs Reviewed  COMPREHENSIVE METABOLIC PANEL - Abnormal; Notable for the following components:      Result Value   Glucose, Bld 104 (*)    BUN 22 (*)    Albumin 3.0 (*)    Alkaline Phosphatase 356 (*)    Total Bilirubin 0.2 (*)    All other components within normal limits  CBC - Abnormal; Notable for the following components:   WBC 16.3 (*)    RBC 3.81 (*)    Hemoglobin 8.6 (*)    HCT 29.6 (*)    MCV 77.7 (*)    MCH 22.6 (*)    MCHC 29.1 (*)    RDW 17.7 (*)    Platelets 842 (*)    All other components within normal limits  ETHANOL  SALICYLATE LEVEL  ACETAMINOPHEN LEVEL  RAPID URINE DRUG SCREEN, HOSP PERFORMED    EKG None  Radiology Dg Chest 2 View  Result Date: 08/23/2017 CLINICAL DATA:   Chest pain for 3-7 days of dyspnea. EXAM: CHEST - 2 VIEW COMPARISON:  05/12/2017 FINDINGS: The heart size and mediastinal contours are within normal limits. Mild aortic atherosclerosis at the arch without aneurysm. Interval progression of metastatic pulmonary disease with increase in size of bilateral pulmonary masslike opacities. Dressing projects over the lateral aspect of the left hemithorax were there is asymmetric soft tissue prominence relative to the right chest wall. No aggressive osseous lesions identified. IMPRESSION: 1. Interval increase in size of several bilateral pulmonary masslike opacities compatible with worsening metastatic disease. 2. Dressing overlies the left lateral aspect of the chest wall with asymmetric soft tissue prominence of the left lateral chest wall relative to right. Electronically Signed   By: Ashley Royalty M.D.   On: 08/23/2017 03:54    Procedures Procedures (including critical care time)  Medications Ordered in ED Medications  mirtazapine (REMERON) tablet 15 mg (has no administration in time range)  oxyCODONE (Oxy IR/ROXICODONE) immediate release tablet 10 mg (10 mg Oral Given 08/23/17 1628)  gabapentin (NEURONTIN) capsule 600 mg (600 mg Oral Given 08/23/17 1628)     Initial Impression / Assessment and Plan / ED Course  I have reviewed the triage vital signs and the nursing notes.  Pertinent labs & imaging results that were available during my care of the patient were reviewed by me and considered in my medical decision making (see chart for details).       62 y.o. female with past medical history of breast cancer currently on hospice who presents the emergency department today for suicidal ideation.  I spoke with patient's hospice nurse Gar Ponto reports that she has been under hospice care since January 9.  They report that she has been noncompliant with medication.  They state that she tells them during visits that she does not care about their schedule she  will take her medications whenever she is in pain as she feels necessary.  The patient frequently  misses her appointments.  She did recently have a refill of her gabapentin and oxycodone 10mg   including 60 pills of each on Monday, 08/16/2017.  The patient started calling the office telling them that she was out of all of her medications on Thursday, 08/19/2017.  A nurse went out to visit the house and found that she only had 3 gabapentin left and no oxycodone.  A social worker attempted to see the patient on Friday but was refused down to the patient's house.  The patient presents the emergency permit today complaining of her chronic breast pain.  She had a reassuring EKG and negative troponin.  The patient was discharged home.  She had a outpatient visit by her hospice doctor at noon today.  She was told that she will not have refills of her medications until April 8.  After the visit the patient called EMS and reported that she was suicidal.  To myself she states that she is now suicidal because she is in so much pain and no one will help her.  Does not have a plan.  She has not attempted this.  She states that she has taken 10 mg of oxycodone today for which she got in the emergency department during her earlier visit.  No other medication use today.  She states she continues to have chronic breast pain.  She denies any chest pain, shortness of breath, nausea, diaphoresis, hemoptysis, cough, lower leg swelling.  She states this is her chronic pain and has not change from baseline.  It is worse with palpation.  She denies any alcohol use.  No homicidal ideation.  No other complaints at this time.  Patient had reassuring EKG and negative troponin earlier today.  No new complaints.  Suspect this is the patient's chronic pain 2/2 cancer. She denies any fever, chest pain, shortness of breath, nausea, diaphoresis, hemoptysis, cough, lower leg swelling.  No other physical complaints at this time. She is complaining of  suicidal ideation. No plan. Will order TTS consult.  Patient is medically cleared.  Screening labs drawn. Home meds ordered.  TTS recommends observe overnight.  Patient case discussed with Dr. Ralene Bathe who is in agreement with plan.  Final Clinical Impressions(s) / ED Diagnoses   Final diagnoses:  Encounter for medical clearance for patient hold    ED Discharge Orders    None       Lorelle Gibbs 08/23/17 Liliana Cline, MD 08/24/17 1450

## 2017-08-23 NOTE — ED Notes (Signed)
Bed: YT24 Expected date:  Expected time:  Means of arrival:  Comments: TR3

## 2017-08-23 NOTE — Discharge Instructions (Addendum)
You will be visited by personnel from hospice and palliative care this morning.  They will help to arrange pain control for you

## 2017-08-23 NOTE — ED Notes (Signed)
Pt ambulated to room from waiting room, tolerated well. 

## 2017-08-23 NOTE — ED Notes (Signed)
Pt reported to this writer that she ran out of pain medication "either last Monday or Tuesday."  Sts she has contacted Hospice and "they can't get me anymore until next Monday.  They say I'm taking too much, but I'm going by the bottle.  I can have it 4 hours.  They told me, if I wait 5 or 6, I'd still have some left."  It is noted the Pt has called out several times within 15 minutes of arrival asking for pain medication.

## 2017-08-23 NOTE — ED Triage Notes (Addendum)
Per EMS, patient coming from home, where [facility] was called for SI. Denies plan. Hx breast cancer. Reports she ran out of oxycodone last week and pain has worsened. Seen at [facility] this morning and discharged for same. Reports she was given pain medicine there and when it wore off she became suicidal again due to pain.   Per hospice, due to patient abusing medication, she is provided with a two week supply at a time. Per hospice, patient was given medication refills with 60 gabapentin and 60 oxycodone on 3/25.

## 2017-08-24 ENCOUNTER — Emergency Department (HOSPITAL_COMMUNITY)

## 2017-08-24 ENCOUNTER — Telehealth: Payer: Self-pay

## 2017-08-24 ENCOUNTER — Encounter (HOSPITAL_COMMUNITY): Payer: Self-pay

## 2017-08-24 ENCOUNTER — Other Ambulatory Visit: Payer: Self-pay

## 2017-08-24 DIAGNOSIS — D72829 Elevated white blood cell count, unspecified: Secondary | ICD-10-CM | POA: Diagnosis present

## 2017-08-24 DIAGNOSIS — F329 Major depressive disorder, single episode, unspecified: Secondary | ICD-10-CM | POA: Diagnosis present

## 2017-08-24 DIAGNOSIS — Z008 Encounter for other general examination: Secondary | ICD-10-CM | POA: Diagnosis not present

## 2017-08-24 DIAGNOSIS — Z515 Encounter for palliative care: Secondary | ICD-10-CM | POA: Diagnosis present

## 2017-08-24 DIAGNOSIS — Z560 Unemployment, unspecified: Secondary | ICD-10-CM | POA: Diagnosis not present

## 2017-08-24 DIAGNOSIS — Z79899 Other long term (current) drug therapy: Secondary | ICD-10-CM | POA: Diagnosis not present

## 2017-08-24 DIAGNOSIS — G893 Neoplasm related pain (acute) (chronic): Secondary | ICD-10-CM | POA: Diagnosis present

## 2017-08-24 DIAGNOSIS — D638 Anemia in other chronic diseases classified elsewhere: Secondary | ICD-10-CM | POA: Diagnosis present

## 2017-08-24 DIAGNOSIS — Z9114 Patient's other noncompliance with medication regimen: Secondary | ICD-10-CM | POA: Diagnosis not present

## 2017-08-24 DIAGNOSIS — Z886 Allergy status to analgesic agent status: Secondary | ICD-10-CM | POA: Diagnosis not present

## 2017-08-24 DIAGNOSIS — R64 Cachexia: Secondary | ICD-10-CM | POA: Diagnosis present

## 2017-08-24 DIAGNOSIS — Z6821 Body mass index (BMI) 21.0-21.9, adult: Secondary | ICD-10-CM | POA: Diagnosis not present

## 2017-08-24 DIAGNOSIS — Z853 Personal history of malignant neoplasm of breast: Secondary | ICD-10-CM

## 2017-08-24 DIAGNOSIS — R509 Fever, unspecified: Secondary | ICD-10-CM | POA: Diagnosis present

## 2017-08-24 DIAGNOSIS — Z66 Do not resuscitate: Secondary | ICD-10-CM | POA: Diagnosis present

## 2017-08-24 DIAGNOSIS — C50919 Malignant neoplasm of unspecified site of unspecified female breast: Secondary | ICD-10-CM | POA: Diagnosis present

## 2017-08-24 DIAGNOSIS — C7800 Secondary malignant neoplasm of unspecified lung: Secondary | ICD-10-CM | POA: Diagnosis present

## 2017-08-24 DIAGNOSIS — Z79891 Long term (current) use of opiate analgesic: Secondary | ICD-10-CM | POA: Diagnosis not present

## 2017-08-24 DIAGNOSIS — R45851 Suicidal ideations: Secondary | ICD-10-CM | POA: Diagnosis present

## 2017-08-24 DIAGNOSIS — N644 Mastodynia: Secondary | ICD-10-CM | POA: Diagnosis present

## 2017-08-24 LAB — CBC WITH DIFFERENTIAL/PLATELET
BASOS ABS: 0 10*3/uL (ref 0.0–0.1)
Basophils Relative: 0 %
Eosinophils Absolute: 0.1 10*3/uL (ref 0.0–0.7)
Eosinophils Relative: 1 %
HEMATOCRIT: 31.5 % — AB (ref 36.0–46.0)
Hemoglobin: 9.6 g/dL — ABNORMAL LOW (ref 12.0–15.0)
LYMPHS PCT: 5 %
Lymphs Abs: 0.9 10*3/uL (ref 0.7–4.0)
MCH: 23.5 pg — ABNORMAL LOW (ref 26.0–34.0)
MCHC: 30.5 g/dL (ref 30.0–36.0)
MCV: 77 fL — AB (ref 78.0–100.0)
Monocytes Absolute: 0.8 10*3/uL (ref 0.1–1.0)
Monocytes Relative: 5 %
NEUTROS ABS: 15.2 10*3/uL — AB (ref 1.7–7.7)
Neutrophils Relative %: 89 %
Platelets: 895 10*3/uL — ABNORMAL HIGH (ref 150–400)
RBC: 4.09 MIL/uL (ref 3.87–5.11)
RDW: 17.9 % — ABNORMAL HIGH (ref 11.5–15.5)
WBC: 17 10*3/uL — AB (ref 4.0–10.5)

## 2017-08-24 LAB — COMPREHENSIVE METABOLIC PANEL
ALT: 34 U/L (ref 14–54)
ANION GAP: 10 (ref 5–15)
AST: 21 U/L (ref 15–41)
Albumin: 3.2 g/dL — ABNORMAL LOW (ref 3.5–5.0)
Alkaline Phosphatase: 402 U/L — ABNORMAL HIGH (ref 38–126)
BILIRUBIN TOTAL: 0.4 mg/dL (ref 0.3–1.2)
BUN: 15 mg/dL (ref 6–20)
CO2: 24 mmol/L (ref 22–32)
Calcium: 9 mg/dL (ref 8.9–10.3)
Chloride: 105 mmol/L (ref 101–111)
Creatinine, Ser: 0.77 mg/dL (ref 0.44–1.00)
Glucose, Bld: 110 mg/dL — ABNORMAL HIGH (ref 65–99)
POTASSIUM: 4 mmol/L (ref 3.5–5.1)
Sodium: 139 mmol/L (ref 135–145)
TOTAL PROTEIN: 8.2 g/dL — AB (ref 6.5–8.1)

## 2017-08-24 LAB — URINALYSIS, ROUTINE W REFLEX MICROSCOPIC
Bilirubin Urine: NEGATIVE
GLUCOSE, UA: NEGATIVE mg/dL
Hgb urine dipstick: NEGATIVE
Ketones, ur: NEGATIVE mg/dL
LEUKOCYTES UA: NEGATIVE
Nitrite: NEGATIVE
PROTEIN: NEGATIVE mg/dL
Specific Gravity, Urine: 1.004 — ABNORMAL LOW (ref 1.005–1.030)
pH: 6 (ref 5.0–8.0)

## 2017-08-24 LAB — I-STAT CG4 LACTIC ACID, ED: Lactic Acid, Venous: 1.27 mmol/L (ref 0.5–1.9)

## 2017-08-24 MED ORDER — SODIUM CHLORIDE 0.9 % IV BOLUS (SEPSIS)
1000.0000 mL | Freq: Once | INTRAVENOUS | Status: AC
  Administered 2017-08-24: 1000 mL via INTRAVENOUS

## 2017-08-24 MED ORDER — ONDANSETRON HCL 4 MG/2ML IJ SOLN
4.0000 mg | Freq: Three times a day (TID) | INTRAMUSCULAR | Status: AC | PRN
  Filled 2017-08-24: qty 2

## 2017-08-24 MED ORDER — SODIUM CHLORIDE 0.45 % IV SOLN
INTRAVENOUS | Status: DC
  Administered 2017-08-24 – 2017-08-25 (×3): via INTRAVENOUS

## 2017-08-24 MED ORDER — OXYCODONE-ACETAMINOPHEN 5-325 MG PO TABS
1.0000 | ORAL_TABLET | Freq: Four times a day (QID) | ORAL | Status: DC | PRN
  Administered 2017-08-24 – 2017-08-25 (×4): 1 via ORAL
  Filled 2017-08-24 (×4): qty 1

## 2017-08-24 MED ORDER — ACETAMINOPHEN 325 MG PO TABS: 325.0000 mg | ORAL_TABLET | Freq: Four times a day (QID) | ORAL | Status: DC | PRN

## 2017-08-24 MED ORDER — ACETAMINOPHEN 325 MG PO TABS
650.0000 mg | ORAL_TABLET | Freq: Four times a day (QID) | ORAL | Status: DC | PRN
Start: 2017-08-24 — End: 2017-08-26
  Administered 2017-08-24 – 2017-08-25 (×6): 650 mg via ORAL
  Filled 2017-08-24 (×7): qty 2

## 2017-08-24 MED ORDER — SODIUM CHLORIDE 0.9 % IV BOLUS (SEPSIS): 500.0000 mL | Freq: Once | INTRAVENOUS | Status: DC

## 2017-08-24 MED ORDER — ENOXAPARIN SODIUM 40 MG/0.4ML ~~LOC~~ SOLN
40.0000 mg | SUBCUTANEOUS | Status: DC
  Filled 2017-08-24 (×3): qty 0.4

## 2017-08-24 MED ORDER — SODIUM CHLORIDE 0.9 % IV BOLUS (SEPSIS): 250.0000 mL | Freq: Once | INTRAVENOUS | Status: DC

## 2017-08-24 NOTE — Telephone Encounter (Signed)
Received call from hospice nurse, Jane ([facility]). Regarding verbal order request for pt medicare recertification. Per Dr.Gudena, okay to give verbal orders to continue hospice care for pt.

## 2017-08-24 NOTE — ED Provider Notes (Signed)
3:30 AM patient's nurse informed me that patient had a fever of 101.6.  I saw the patient at 4:20 AM.  She denies cough, sore throat, dysuria, frequency, vomiting, or diarrhea.  She states she has some nausea.  She denies being around anybody who is ill that she is aware of.  Patient is a small frail female who is awake and cooperative.  There was no coughing heard during my exam.  She is moving all her extremities well.  Laboratory testing was done including blood cultures and urine and urine culture to evaluate her fever.  Chest x-ray was ordered.  7:15 AM patient is just now given [place] a urine sample.  Patient has persistent leukocytosis.  She does not have any specific symptoms related to a acute infection, I wonder if her fevers possibly tumor related.  We will have hospitalist do a consult on patient for their opinion so hopefully she can be admitted to the psychiatric service.  Patient has no obvious source of her fever.  She was discussed with Dr. Jacki Cones, hospitalist at 7:30 AM.  She is going to admit patient to observation on a MedSurg bed for 24 hours to try determine the etiology of her fever.  Psychiatry can follow her as a medical inpatient.  Results for orders placed or performed during the hospital encounter of 08/23/17  Comprehensive metabolic panel  Result Value Ref Range   Sodium 139 135 - 145 mmol/L   Potassium 4.5 3.5 - 5.1 mmol/L   Chloride 106 101 - 111 mmol/L   CO2 24 22 - 32 mmol/L   Glucose, Bld 104 (H) 65 - 99 mg/dL   BUN 22 (H) 6 - 20 mg/dL   Creatinine, Ser 0.69 0.44 - 1.00 mg/dL   Calcium 9.3 8.9 - 10.3 mg/dL   Total Protein 7.6 6.5 - 8.1 g/dL   Albumin 3.0 (L) 3.5 - 5.0 g/dL   AST 21 15 - 41 U/L   ALT 37 14 - 54 U/L   Alkaline Phosphatase 356 (H) 38 - 126 U/L   Total Bilirubin 0.2 (L) 0.3 - 1.2 mg/dL   GFR calc non Af Amer >60 >60 mL/min   GFR calc Af Amer >60 >60 mL/min   Anion gap 9 5 - 15  Ethanol  Result Value Ref Range   Alcohol, Ethyl  (B) <67 <12 mg/dL  Salicylate level  Result Value Ref Range   Salicylate Lvl <4.5 2.8 - 30.0 mg/dL  Acetaminophen level  Result Value Ref Range   Acetaminophen (Tylenol), Serum 12 10 - 30 ug/mL  cbc  Result Value Ref Range   WBC 16.3 (H) 4.0 - 10.5 K/uL   RBC 3.81 (L) 3.87 - 5.11 MIL/uL   Hemoglobin 8.6 (L) 12.0 - 15.0 g/dL   HCT 29.6 (L) 36.0 - 46.0 %   MCV 77.7 (L) 78.0 - 100.0 fL   MCH 22.6 (L) 26.0 - 34.0 pg   MCHC 29.1 (L) 30.0 - 36.0 g/dL   RDW 17.7 (H) 11.5 - 15.5 %   Platelets 842 (H) 150 - 400 K/uL  Rapid urine drug screen (hospital performed)  Result Value Ref Range   Opiates NONE DETECTED NONE DETECTED   Cocaine NONE DETECTED NONE DETECTED   Benzodiazepines NONE DETECTED NONE DETECTED   Amphetamines NONE DETECTED NONE DETECTED   Tetrahydrocannabinol NONE DETECTED NONE DETECTED   Barbiturates NONE DETECTED NONE DETECTED  Comprehensive metabolic panel  Result Value Ref Range   Sodium 139 135 - 145 mmol/L  Potassium 4.0 3.5 - 5.1 mmol/L   Chloride 105 101 - 111 mmol/L   CO2 24 22 - 32 mmol/L   Glucose, Bld 110 (H) 65 - 99 mg/dL   BUN 15 6 - 20 mg/dL   Creatinine, Ser 0.77 0.44 - 1.00 mg/dL   Calcium 9.0 8.9 - 10.3 mg/dL   Total Protein 8.2 (H) 6.5 - 8.1 g/dL   Albumin 3.2 (L) 3.5 - 5.0 g/dL   AST 21 15 - 41 U/L   ALT 34 14 - 54 U/L   Alkaline Phosphatase 402 (H) 38 - 126 U/L   Total Bilirubin 0.4 0.3 - 1.2 mg/dL   GFR calc non Af Amer >60 >60 mL/min   GFR calc Af Amer >60 >60 mL/min   Anion gap 10 5 - 15  CBC WITH DIFFERENTIAL  Result Value Ref Range   WBC 17.0 (H) 4.0 - 10.5 K/uL   RBC 4.09 3.87 - 5.11 MIL/uL   Hemoglobin 9.6 (L) 12.0 - 15.0 g/dL   HCT 31.5 (L) 36.0 - 46.0 %   MCV 77.0 (L) 78.0 - 100.0 fL   MCH 23.5 (L) 26.0 - 34.0 pg   MCHC 30.5 30.0 - 36.0 g/dL   RDW 17.9 (H) 11.5 - 15.5 %   Platelets 895 (H) 150 - 400 K/uL   Neutrophils Relative % 89 %   Neutro Abs 15.2 (H) 1.7 - 7.7 K/uL   Lymphocytes Relative 5 %   Lymphs Abs 0.9 0.7 - 4.0  K/uL   Monocytes Relative 5 %   Monocytes Absolute 0.8 0.1 - 1.0 K/uL   Eosinophils Relative 1 %   Eosinophils Absolute 0.1 0.0 - 0.7 K/uL   Basophils Relative 0 %   Basophils Absolute 0.0 0.0 - 0.1 K/uL  Urinalysis, Routine w reflex microscopic  Result Value Ref Range   Color, Urine COLORLESS (A) YELLOW   APPearance CLEAR CLEAR   Specific Gravity, Urine 1.004 (L) 1.005 - 1.030   pH 6.0 5.0 - 8.0   Glucose, UA NEGATIVE NEGATIVE mg/dL   Hgb urine dipstick NEGATIVE NEGATIVE   Bilirubin Urine NEGATIVE NEGATIVE   Ketones, ur NEGATIVE NEGATIVE mg/dL   Protein, ur NEGATIVE NEGATIVE mg/dL   Nitrite NEGATIVE NEGATIVE   Leukocytes, UA NEGATIVE NEGATIVE  I-Stat CG4 Lactic Acid, ED  (not at  [facility])  Result Value Ref Range   Lactic Acid, Venous 1.27 0.5 - 1.9 mmol/L    Laboratory interpretation all normal except persistent leukocytosis, persistent anemia   Dg Chest 2 View  Result Date: 08/24/2017 CLINICAL DATA:  62 year old female with fever and tachycardia. History of breast cancer. EXAM: CHEST - 2 VIEW COMPARISON:  Chest radiograph dated 08/23/2017 and CT dated 04/20/2017 FINDINGS: Multiple bilateral pulmonary nodules as seen on the earlier radiograph and consistent with metastatic disease. There is mild eventration of the left hemidiaphragm with minimal left lung base atelectatic changes. No new consolidative changes. There is no pleural effusion or pneumothorax. The cardiac silhouette is within normal limits. No acute osseous pathology. Left chest wall irregular soft tissue mass and dressing. IMPRESSION: 1. No acute cardiopulmonary process. 2. Bilateral pulmonary metastatic disease. 3. Left chest wall soft tissue mass and overlying dressing Electronically Signed   By: Anner Crete M.D.   On: 08/24/2017 04:24   Dg Chest 2 View  Result Date: 08/23/2017 CLINICAL DATA:  Chest pain for 3-7 days of dyspnea. IMPRESSION: 1. Interval increase in size of several bilateral pulmonary masslike  opacities compatible with worsening metastatic disease.  2. Dressing overlies the left lateral aspect of the chest wall with asymmetric soft tissue prominence of the left lateral chest wall relative to right. Electronically Signed   By: Ashley Royalty M.D.   On: 08/23/2017 03:54   Diagnoses that have been ruled out:  None  Diagnoses that are still under consideration:  None  Final diagnoses:  Encounter for medical clearance for patient hold  Fever, unspecified fever cause  Suicidal ideation   Plan admission  Rolland Porter, MD, Barbette Or, MD 08/24/17 [phone]

## 2017-08-24 NOTE — Progress Notes (Signed)
[facility] 1330- [facility] ([facility]) RN Visit @ 10:00.   This is a related and covered hospitalization admission of 08/24/2017 with a [facility] diagnosis of breast cancer per Dr. Tomasa Hosteller.   Patent was transported to [facility] ED 08/23/2017  for treatment of pain unmanaged at home. She previously went to [facility] ED 08/23/2017 earlier in the day and was treated and released. Prior to coming to ED she called triage and requested a call back from her primary RN but the primary RN was unable to reach her on call back. In addition to complaints of pain, patient expressed suicidal ideations due to pain and was evaluated by psychiatry before being admitted as in inpatient for evaluation of fever.   GIP LOS 1 day.  She is currently being treated for pain and evaluation of a elevated temp of 101.6.  MD notes: Disposition Plan: She is being admitted because of fever and suicidal ideation.  If she remains afebrile and no source of infection has been identified she may be discharged.  Follow-up psych recommendations. Consults called: Psychiatry  Psychiatry notes: Assessment:  Desarie Feild is a 62 y.o. female who was admitted with SI in the setting of poorly managed pain. She denies SI today since admission to the hospital because her pain has improved. She denies HI or AVH. She does not warrant inpatient psychiatric hospitalization at this time. She should continue to see her outpatient provider for pain management since this is largely contributing to her mood and intermittent SI. It may be difficult to find a balance between adequately managing her pain and reducing the risk for medication misuse to treat poorly managed pain. She will need to continue to work closely with her provider to find the optimal medication regimen.   Treatment Plan Summary: -Continue Remeron 15 mg qhs for depression, anxiety and appetite stimulation. -Patient is psychiatrically cleared. Psychiatry will sign off on patient  at this time. Please consult psychiatry again as needed.   Visited with patient at bedside. She is awake and alert and oriented. She does not appear in distress. She was able to get to BR unassisted. She rates her pain currently at 9/0-10 and has requested Tylenol from RN. She had a sitter in ED but this has been discontinued. She did not express any desire for self harm during our visit.   Medications: 0.45% sodium chloride @ 100/hr, Lovenox 40mg  [place] QD, Neurontin 600mg  PO TID, Remeron 15mg  QHS, Tylenol 650mg  PO QID PRN last given @ 11:02, Oxycodone 10mg  PO Q 4hrs PRN last given @ 07:36.   Goals of Care: Patient wants her pain managed, to be comfortable. She has DNR.   [facility] will continue to follow and anticipate discharge needs. Should ambulance transport be needed at time of discharge, please call [facility] as [facility] contracts with [facility] for our patients.  [facility] team is aware of her admission.  [name] notified of patient admission. Transfer summary and current [facility] medication list placed on shadow chart.  Please call with any hospice related questions or concerns.   ?Farrel Gordon, RN, [facility] Liaison  [place] are on AMION

## 2017-08-24 NOTE — H&P (Signed)
History and Physical    Stormy Connon POE:[id number] DOB: December 26, 1955 DOA: 08/23/2017  PCP: [facility] Patient coming from: Home  Chief Complaint: Chest pain and shortness of breath for 1 week  HPI: Ann Oliver is a 62 y.o. female with medical history significant of major depression, breast cancer with metastases to the lungs, suicidal ideation was being followed by [facility], admitted with chest pain and shortness of breath.  Apparently patient had 60 pills of oxycodone and 60 pills of gabapentin filled on 08/16/2017 after discharge from [facility] ER.  And as of this last weekend there was no medication left. she is due for next prescription refill on April 8.  Patient reported that she was suicidal and so a behavioral health consult was placed.  Psychiatric she is due to see her this morning.  During her stay in the ER he she spiked a temperature of 101.6.  Patient has a history of abusing pain medications and as a history of overdosing.  So only a 15-day supply is given to her at that time.  Patient feels that she is suicidal because of pain.  ED-her blood pressure was 126/77, pulse was 107, respiration 18-25, T-max 101.6.  She was given Tylenol recheck temperature was 99.4.  Sodium 139 potassium 4.0 BUN 15 creatinine 0.77 her anion gap is 10 alkaline phosphatase is 402 albumin 3.2 lactic acid level 1.27 WBC count is 17 hemoglobin 9.6 platelet count 895.  A urine culture and blood culture has been done in the ER.  Chest x-ray showed no acute cardiopulmonary process bilateral pulmonary metastatic disease left chest wall soft tissue mass.   Review of Systems: As per HPI otherwise all other systems reviewed and are negative  Ambulatory Status:  Past Medical History:  Diagnosis Date  . Breast cancer (HCC)    METS TO LUNGS  . Sinusitis   . Tylenol overdose 05/2017    Past Surgical History:  Procedure Laterality Date  . BIOPSY BREAST    . IR  FLUORO GUIDE CV LINE RIGHT  11/06/2016  . IR US GUIDE VASC ACCESS RIGHT  11/06/2016    Social History   Socioeconomic History  . Marital status: Single    Spouse name: Not on file  . Number of children: Not on file  . Years of education: Not on file  . Highest education level: Not on file  Occupational History  . Occupation: unemployed  Social Needs  . Financial resource strain: Not on file  . Food insecurity:    Worry: Not on file    Inability: Not on file  . Transportation needs:    Medical: Not on file    Non-medical: Not on file  Tobacco Use  . Smoking status: Never Smoker  . Smokeless tobacco: Never Used  Substance and Sexual Activity  . Alcohol use: No  . Drug use: No  . Sexual activity: Not on file  Lifestyle  . Physical activity:    Days per week: Not on file    Minutes per session: Not on file  . Stress: Not on file  Relationships  . Social connections:    Talks on phone: Not on file    Gets together: Not on file    Attends religious service: Not on file    Active member of club or organization: Not on file    Attends meetings of clubs or organizations: Not on file    Relationship status: Not on file  . Intimate partner  violence:    Fear of current or ex partner: Not on file    Emotionally abused: Not on file    Physically abused: Not on file    Forced sexual activity: Not on file  Other Topics Concern  . Not on file  Social History Narrative  . Not on file    Allergies  Allergen Reactions  . Aspirin Palpitations    Family History  Family history unknown: Yes      Prior to Admission medications   Medication Sig Start Date End Date Taking? Authorizing Provider  gabapentin (NEURONTIN) 600 MG tablet Take 1 tablet (600 mg total) by mouth 3 (three) times daily. 08/14/17  Yes Caccavale, Sophia, PA-C  mirtazapine (REMERON) 15 MG tablet Take 1 tablet (15 mg total) by mouth at bedtime. 08/09/17  Yes Nicholas Lose, MD  Multiple Vitamin (MULTIVITAMIN WITH  MINERALS) TABS tablet Take 1 tablet by mouth daily. 05/07/17  Yes Bonnielee Haff, MD  ondansetron (ZOFRAN) 8 MG tablet Take 1 tablet (8 mg total) by mouth every 8 (eight) hours as needed for nausea or vomiting. 05/07/17  Yes Bonnielee Haff, MD  Oxycodone HCl 10 MG TABS Take 1 tablet (10 mg total) by mouth every 4 (four) hours as needed. Patient taking differently: Take 10 mg by mouth every 4 (four) hours as needed (pain).  08/14/17  Yes Caccavale, Sophia, PA-C  loperamide (IMODIUM) 2 MG capsule Take 1 capsule (2 mg total) by mouth as needed for diarrhea or loose stools. 05/28/17   Nicholas Lose, MD    Physical Exam: Vitals:   08/24/17 0408 08/24/17 0500 08/24/17 0559 08/24/17 0727  BP: (!) 174/73 134/68  139/71  Pulse:    100  Resp: (!) 24 (!) 25  (!) 25  Temp:   99.4 F (37.4 C)   TempSrc:   Oral   SpO2:    100%     General:  Appears calm and comfortable Eyes:  PERRL, EOMI, normal lids, iris ENT:  grossly normal hearing, lips & tongue, mmm Neck:  no LAD, masses or thyromegaly Cardiovascular:  RRR, no m/r/g. No LE edema.  Respiratory:  CTA bilaterally, no w/r/r. Normal respiratory effort. Abdomen: soft, ntnd, NABS Skin: no rash or induration seen on limited exam Musculoskeletal:  grossly normal tone BUE/BLE, good ROM, no bony abnormality Psychiatric: speaks at a low tone. Neurologic:  CN 2-12 grossly intact, moves all extremities in coordinated fashion, sensation intact  Labs on Admission: I have personally reviewed following labs and imaging studies  CBC: Recent Labs  Lab 08/23/17 0254 08/23/17 1438 08/24/17 0547  WBC 16.7* 16.3* 17.0*  NEUTROABS  --   --  15.2*  HGB 8.8* 8.6* 9.6*  HCT 29.7* 29.6* 31.5*  MCV 77.5* 77.7* 77.0*  PLT 800* 842* 277*   Basic Metabolic Panel: Recent Labs  Lab 08/23/17 0254 08/23/17 1438 08/24/17 0547  NA 133* 139 139  K 3.9 4.5 4.0  CL 101 106 105  CO2 20* 24 24  GLUCOSE 115* 104* 110*  BUN 19 22* 15  CREATININE 0.88 0.69 0.77    CALCIUM 9.0 9.3 9.0   GFR: Estimated Creatinine Clearance: 54.3 mL/min (by C-G formula based on SCr of 0.77 mg/dL). Liver Function Tests: Recent Labs  Lab 08/23/17 1438 08/24/17 0547  AST 21 21  ALT 37 34  ALKPHOS 356* 402*  BILITOT 0.2* 0.4  PROT 7.6 8.2*  ALBUMIN 3.0* 3.2*   No results for input(s): LIPASE, AMYLASE in the last 168 hours. No results  for input(s): AMMONIA in the last 168 hours. Coagulation Profile: No results for input(s): INR, PROTIME in the last 168 hours. Cardiac Enzymes: Recent Labs  Lab 08/23/17 0254  TROPONINI <0.03   BNP (last 3 results) No results for input(s): PROBNP in the last 8760 hours. HbA1C: No results for input(s): HGBA1C in the last 72 hours. CBG: No results for input(s): GLUCAP in the last 168 hours. Lipid Profile: No results for input(s): CHOL, HDL, LDLCALC, TRIG, CHOLHDL, LDLDIRECT in the last 72 hours. Thyroid Function Tests: No results for input(s): TSH, T4TOTAL, FREET4, T3FREE, THYROIDAB in the last 72 hours. Anemia Panel: No results for input(s): VITAMINB12, FOLATE, FERRITIN, TIBC, IRON, RETICCTPCT in the last 72 hours. Urine analysis:    Component Value Date/Time   COLORURINE COLORLESS (A) 08/24/2017 0720   APPEARANCEUR CLEAR 08/24/2017 0720   LABSPEC 1.004 (L) 08/24/2017 0720   PHURINE 6.0 08/24/2017 0720   GLUCOSEU NEGATIVE 08/24/2017 0720   HGBUR NEGATIVE 08/24/2017 0720   BILIRUBINUR NEGATIVE 08/24/2017 0720   KETONESUR NEGATIVE 08/24/2017 0720   PROTEINUR NEGATIVE 08/24/2017 0720   NITRITE NEGATIVE 08/24/2017 0720   LEUKOCYTESUR NEGATIVE 08/24/2017 0720    Creatinine Clearance: Estimated Creatinine Clearance: 54.3 mL/min (by C-G formula based on SCr of 0.77 mg/dL).  Sepsis Labs: @LABRCNTIP (procalcitonin:4,lacticidven:4) )No results found for this or any previous visit (from the past 240 hour(s)).   Radiological Exams on Admission: Dg Chest 2 View  Result Date: 08/24/2017 CLINICAL DATA:  62 year old female  with fever and tachycardia. History of breast cancer. EXAM: CHEST - 2 VIEW COMPARISON:  Chest radiograph dated 08/23/2017 and CT dated 04/20/2017 FINDINGS: Multiple bilateral pulmonary nodules as seen on the earlier radiograph and consistent with metastatic disease. There is mild eventration of the left hemidiaphragm with minimal left lung base atelectatic changes. No new consolidative changes. There is no pleural effusion or pneumothorax. The cardiac silhouette is within normal limits. No acute osseous pathology. Left chest wall irregular soft tissue mass and dressing. IMPRESSION: 1. No acute cardiopulmonary process. 2. Bilateral pulmonary metastatic disease. 3. Left chest wall soft tissue mass and overlying dressing Electronically Signed   By: Anner Crete M.D.   On: 08/24/2017 04:24   Dg Chest 2 View  Result Date: 08/23/2017 CLINICAL DATA:  Chest pain for 3-7 days of dyspnea. EXAM: CHEST - 2 VIEW COMPARISON:  05/12/2017 FINDINGS: The heart size and mediastinal contours are within normal limits. Mild aortic atherosclerosis at the arch without aneurysm. Interval progression of metastatic pulmonary disease with increase in size of bilateral pulmonary masslike opacities. Dressing projects over the lateral aspect of the left hemithorax were there is asymmetric soft tissue prominence relative to the right chest wall. No aggressive osseous lesions identified. IMPRESSION: 1. Interval increase in size of several bilateral pulmonary masslike opacities compatible with worsening metastatic disease. 2. Dressing overlies the left lateral aspect of the chest wall with asymmetric soft tissue prominence of the left lateral chest wall relative to right. Electronically Signed   By: Ashley Royalty M.D.   On: 08/23/2017 03:54    EKG: Independently reviewed.   Assessment/Plan Active Problems:   Fever   1] end-stage metastatic breast cancerwith metastases to the lungs-the reason for her admission is the concern for  misusing pain medications.  Patient has a history of suicidal ideations and overdose.  She finished her refill of oxycodone and gabapentin way before it was due to be over.  Patient will be seen by psychiatry today to assess if she needs inpatient psych admission.  Patient is followed by [facility].  2] fever with leukocytosis-her WBC count was 9.30 May 2017.  Ever since she has  an upward trend to her WBC count.  In the middle of March her WBC count was 14.7 and today at 17.0.  No source of infection identified yet.  Follow-up cultures.  Chest x-ray no evidence of acute infiltrates.  UA shows no evidence of UTI. ? if it is tumor fever.  DVT prophylaxis Lovenox :Code Status: DNR Family Communication: No family available Disposition Plan: She is being admitted because of fever and suicidal ideation.  If she remains afebrile and no source of infection has been identified she may be discharged.  Follow-up psych recommendations. Consults called: Psychiatry Admission status: Observation   Georgette Shell MD [facility]  If 7PM-7AM, please contact night-coverage [email or web address] Password [email or web address]  08/24/2017, 9:30 AM

## 2017-08-24 NOTE — Progress Notes (Signed)
Pt's hospice social worker reached out to CSW to provide contact information - Joellen Jersey [phone]. Pt under care of [facility], lives at home and plans to return at [place].   Social worker noted that upon ED admission pt was reportedly voicing vague SI but now denying any.   Sharren Bridge, MSW, LCSW Clinical Social Work 08/24/2017 [phone]

## 2017-08-24 NOTE — ED Notes (Signed)
Two Gold "saved" tube in main lab

## 2017-08-24 NOTE — Consult Note (Signed)
[facility] Consult   Reason for Consult:  SI Referring Physician:  EDP Patient Identification: Ann Oliver MRN:  [id number] Principal Diagnosis: Suicidal ideation Diagnosis:   Patient Active Problem List   Diagnosis Date Noted  . Fever [R50.9] 08/24/2017  . MDD (major depressive disorder), recurrent episode, moderate ([place]) [F33.1] 06/03/2017  . Suicidal ideation [R45.851] 06/02/2017  . Tylenol overdose [T39.1X1A] 06/01/2017  . Breast mass [N63.0]   . Acute blood loss anemia [D62]   . Hemorrhage from wound [R58]   . Ascites [R18.8]   . Evaluation by psychiatric service required [Z00.8] 04/22/2017  . AKI (acute kidney injury) ([place]) [N17.9] 03/26/2017  . Leg swelling [M79.89] 03/26/2017  . Diarrhea [R19.7] 03/26/2017  . Leukocytosis [D72.829] 03/26/2017  . Breast wound [S21.009A] 03/18/2017  . Blood loss anemia [D50.0] 01/18/2017  . Port catheter in place [facility] 12/02/2016  . Necrotizing soft tissue infection [M79.89] 11/21/2016  . Wound infection [T14.8XXA, L08.9] 10/28/2016  . Cellulitis of left breast [N61.0] 10/28/2016  . Encounter for palliative care [Z51.5]   . Goals of care, counseling/discussion [Z71.89]   . 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 Malnutrition of moderate degree [E44.0] 09/25/2016  . Breast cancer of upper-outer quadrant of left female breast ([place]) [C50.412] 10/22/2015  . THYROID NODULE, RIGHT [E04.1] 05/31/2007  . Depressed [F32.9] 05/31/2007  . GERD [K21.9] 05/31/2007  . HOT FLASHES [N95.1] 05/31/2007  . HEADACHE [R51] 05/31/2007    Total Time spent with patient: 30 minutes  Subjective:   Ann Oliver is a 62 y.o. female patient admitted with SI in the setting of poorly managed pain.  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 She was initially admitted for SI in the setting of pain.  She is  prescribed Oxycodone and Gabapentin for pain management.  She ran out of a recently filled prescription for both several days early.  She was told by her provider that it would not be refilled until it was next due and she subsequently reported SI.  Today she denies SI.  She reports feeling better today due to improved pain.  She denies HI or AVH.  She denies problems with sleep and appetite.  She does report problems with sleep when her pain is poorly managed.  She lives at home alone and is able to attend to her ADLs.  Past Psychiatric History: Depression  Risk to Self: Suicidal Ideation: Yes-Currently Present Suicidal Intent: No Is patient at risk for suicide?: Yes Suicidal Plan?: No Access to Means: No What has been your use of drugs/alcohol within the last 12 months?: Current use of opiates How many times?: 0 Other Self Harm Risks: NA Triggers for Past Attempts: Other (Comment)(Pain management) Intentional Self Injurious Behavior: None Risk to Others: Homicidal Ideation: No Thoughts of Harm to Others: No Current Homicidal Intent: No Current Homicidal Plan: No Access to Homicidal Means: No Identified Victim: NA History of harm to others?: No Assessment of Violence: None Noted Violent Behavior Description: NA Does patient have access to weapons?: No Criminal Charges Pending?: No Does patient have a court date: No Prior Inpatient Therapy: Prior Inpatient Therapy: Yes Prior Therapy Dates: 2018 Prior Therapy Facilty/Provider(s): (Unknown) Reason for Treatment: (Pain management med abuse) Prior Outpatient Therapy: Prior Outpatient Therapy: No Does patient have an ACCT team?: No Does patient have Intensive In-House Services?  : No Does patient have [facility] services? : No Does patient have P4CC  services?: No  Past Medical History:  Past Medical History:  Diagnosis Date  . Breast cancer (HCC)    METS TO LUNGS  . Sinusitis   . Tylenol overdose 05/2017    Past Surgical History:   Procedure Laterality Date  . BIOPSY BREAST    . IR FLUORO GUIDE CV LINE RIGHT  11/06/2016  . IR US GUIDE VASC ACCESS RIGHT  11/06/2016   Family History:  Family History  Family history unknown: Yes   Family Psychiatric  History: Denies  Social History:  Social History   Substance and Sexual Activity  Alcohol Use No     Social History   Substance and Sexual Activity  Drug Use No    Social History   Socioeconomic History  . Marital status: Single    Spouse name: Not on file  . Number of children: Not on file  . Years of education: Not on file  . Highest education level: Not on file  Occupational History  . Occupation: unemployed  Social Needs  . Financial resource strain: Not on file  . Food insecurity:    Worry: Not on file    Inability: Not on file  . Transportation needs:    Medical: Not on file    Non-medical: Not on file  Tobacco Use  . Smoking status: Never Smoker  . Smokeless tobacco: Never Used  Substance and Sexual Activity  . Alcohol use: No  . Drug use: No  . Sexual activity: Not on file  Lifestyle  . Physical activity:    Days per week: Not on file    Minutes per session: Not on file  . Stress: Not on file  Relationships  . Social connections:    Talks on phone: Not on file    Gets together: Not on file    Attends religious service: Not on file    Active member of club or organization: Not on file    Attends meetings of clubs or organizations: Not on file    Relationship status: Not on file  Other Topics Concern  . Not on file  Social History Narrative  . Not on file   Additional Social History: She lives at home alone.     Allergies:   Allergies  Allergen Reactions  . Aspirin Palpitations    Labs:  Results for orders placed or performed during the hospital encounter of 08/23/17 (from the past 48 hour(s))  Rapid urine drug screen (hospital performed)     Status: None   Collection Time: 08/23/17  2:19 PM  Result Value Ref Range    Opiates NONE DETECTED NONE DETECTED   Cocaine NONE DETECTED NONE DETECTED   Benzodiazepines NONE DETECTED NONE DETECTED   Amphetamines NONE DETECTED NONE DETECTED   Tetrahydrocannabinol NONE DETECTED NONE DETECTED   Barbiturates NONE DETECTED NONE DETECTED    Comment: (NOTE) DRUG SCREEN FOR MEDICAL PURPOSES ONLY.  IF CONFIRMATION IS NEEDED FOR ANY PURPOSE, NOTIFY LAB WITHIN 5 DAYS. LOWEST DETECTABLE LIMITS FOR URINE DRUG SCREEN Drug Class                     Cutoff (ng/mL) Amphetamine and metabolites    1000 Barbiturate and metabolites    200 Benzodiazepine                 517 Tricyclics and metabolites     300 Opiates and metabolites        300 Cocaine and metabolites  300 THC                            50 Performed at [facility], [place] [address], [place], [place] [address]   Comprehensive metabolic panel     Status: Abnormal   Collection Time: 08/23/17  2:38 PM  Result Value Ref Range   Sodium 139 135 - 145 mmol/L   Potassium 4.5 3.5 - 5.1 mmol/L   Chloride 106 101 - 111 mmol/L   CO2 24 22 - 32 mmol/L   Glucose, Bld 104 (H) 65 - 99 mg/dL   BUN 22 (H) 6 - 20 mg/dL   Creatinine, Ser 0.69 0.44 - 1.00 mg/dL   Calcium 9.3 8.9 - 10.3 mg/dL   Total Protein 7.6 6.5 - 8.1 g/dL   Albumin 3.0 (L) 3.5 - 5.0 g/dL   AST 21 15 - 41 U/L   ALT 37 14 - 54 U/L   Alkaline Phosphatase 356 (H) 38 - 126 U/L   Total Bilirubin 0.2 (L) 0.3 - 1.2 mg/dL   GFR calc non Af Amer >60 >60 mL/min   GFR calc Af Amer >60 >60 mL/min    Comment: (NOTE) The eGFR has been calculated using the CKD EPI equation. This calculation has not been validated in all clinical situations. eGFR's persistently <60 mL/min signify possible Chronic Kidney Disease.    Anion gap 9 5 - 15    Comment: Performed at [facility], [place] [address] [place], [place] [address]  Ethanol     Status: None   Collection Time: 08/23/17  2:38 PM  Result Value Ref Range   Alcohol, Ethyl (B)  <10 <10 mg/dL    Comment:        LOWEST DETECTABLE LIMIT FOR SERUM ALCOHOL IS 10 mg/dL FOR MEDICAL PURPOSES ONLY Performed at [facility], [place] [address], [place], [place] [address]   Salicylate level     Status: None   Collection Time: 08/23/17  2:38 PM  Result Value Ref Range   Salicylate Lvl <4.1 2.8 - 30.0 mg/dL    Comment: Performed at [facility], [place] [address] [place], [place] [address]  Acetaminophen level     Status: None   Collection Time: 08/23/17  2:38 PM  Result Value Ref Range   Acetaminophen (Tylenol), Serum 12 10 - 30 ug/mL    Comment:        THERAPEUTIC CONCENTRATIONS VARY SIGNIFICANTLY. A RANGE OF 10-30 ug/mL MAY BE AN EFFECTIVE CONCENTRATION FOR MANY PATIENTS. HOWEVER, SOME ARE BEST TREATED AT CONCENTRATIONS OUTSIDE THIS RANGE. ACETAMINOPHEN CONCENTRATIONS >150 ug/mL AT 4 HOURS AFTER INGESTION AND >50 ug/mL AT 12 HOURS AFTER INGESTION ARE OFTEN ASSOCIATED WITH TOXIC REACTIONS. Performed at [facility], [place] [address], [place], [place] [address]   cbc     Status: Abnormal   Collection Time: 08/23/17  2:38 PM  Result Value Ref Range   WBC 16.3 (H) 4.0 - 10.5 K/uL   RBC 3.81 (L) 3.87 - 5.11 MIL/uL   Hemoglobin 8.6 (L) 12.0 - 15.0 g/dL   HCT 29.6 (L) 36.0 - 46.0 %   MCV 77.7 (L) 78.0 - 100.0 fL   MCH 22.6 (L) 26.0 - 34.0 pg   MCHC 29.1 (L) 30.0 - 36.0 g/dL   RDW 17.7 (H) 11.5 - 15.5 %   Platelets 842 (H) 150 - 400 K/uL    Comment: Performed at [facility]  [place] [address], [place], [place] [address]  I-Stat CG4 Lactic Acid, ED  (not at  [facility])     Status: None   Collection Time: 08/24/17  5:44 AM  Result Value Ref Range   Lactic Acid, Venous 1.27 0.5 - 1.9 mmol/L  Comprehensive metabolic panel     Status: Abnormal   Collection Time: 08/24/17  5:47 AM  Result Value Ref Range   Sodium 139 135 - 145 mmol/L   Potassium 4.0 3.5 - 5.1 mmol/L   Chloride 105 101 - 111 mmol/L    CO2 24 22 - 32 mmol/L   Glucose, Bld 110 (H) 65 - 99 mg/dL   BUN 15 6 - 20 mg/dL   Creatinine, Ser 0.77 0.44 - 1.00 mg/dL   Calcium 9.0 8.9 - 10.3 mg/dL   Total Protein 8.2 (H) 6.5 - 8.1 g/dL   Albumin 3.2 (L) 3.5 - 5.0 g/dL   AST 21 15 - 41 U/L   ALT 34 14 - 54 U/L   Alkaline Phosphatase 402 (H) 38 - 126 U/L   Total Bilirubin 0.4 0.3 - 1.2 mg/dL   GFR calc non Af Amer >60 >60 mL/min   GFR calc Af Amer >60 >60 mL/min    Comment: (NOTE) The eGFR has been calculated using the CKD EPI equation. This calculation has not been validated in all clinical situations. eGFR's persistently <60 mL/min signify possible Chronic Kidney Disease.    Anion gap 10 5 - 15    Comment: Performed at [facility], [place] [address] [place], [place] [address]  CBC WITH DIFFERENTIAL     Status: Abnormal   Collection Time: 08/24/17  5:47 AM  Result Value Ref Range   WBC 17.0 (H) 4.0 - 10.5 K/uL   RBC 4.09 3.87 - 5.11 MIL/uL   Hemoglobin 9.6 (L) 12.0 - 15.0 g/dL   HCT 31.5 (L) 36.0 - 46.0 %   MCV 77.0 (L) 78.0 - 100.0 fL   MCH 23.5 (L) 26.0 - 34.0 pg   MCHC 30.5 30.0 - 36.0 g/dL   RDW 17.9 (H) 11.5 - 15.5 %   Platelets 895 (H) 150 - 400 K/uL   Neutrophils Relative % 89 %   Neutro Abs 15.2 (H) 1.7 - 7.7 K/uL   Lymphocytes Relative 5 %   Lymphs Abs 0.9 0.7 - 4.0 K/uL   Monocytes Relative 5 %   Monocytes Absolute 0.8 0.1 - 1.0 K/uL   Eosinophils Relative 1 %   Eosinophils Absolute 0.1 0.0 - 0.7 K/uL   Basophils Relative 0 %   Basophils Absolute 0.0 0.0 - 0.1 K/uL    Comment: Performed at [facility], [place] [address], [place], [place] [address]  Urinalysis, Routine w reflex microscopic     Status: Abnormal   Collection Time: 08/24/17  7:20 AM  Result Value Ref Range   Color, Urine COLORLESS (A) YELLOW   APPearance CLEAR CLEAR   Specific Gravity, Urine 1.004 (L) 1.005 - 1.030   pH 6.0 5.0 - 8.0   Glucose, UA NEGATIVE NEGATIVE mg/dL   Hgb urine dipstick NEGATIVE  NEGATIVE   Bilirubin Urine NEGATIVE NEGATIVE   Ketones, ur NEGATIVE NEGATIVE mg/dL   Protein, ur NEGATIVE NEGATIVE mg/dL   Nitrite NEGATIVE NEGATIVE   Leukocytes, UA NEGATIVE NEGATIVE    Comment: Performed at [facility] [address] [place], [place] [address]    Current Facility-Administered Medications  Medication Dose Route Frequency Provider Last  Rate Last Dose  . 0.45 % sodium chloride infusion   Intravenous Continuous Georgette Shell, MD      . acetaminophen (TYLENOL) tablet 650 mg  650 mg Oral Q6H PRN Rolland Porter, MD   650 mg at 08/24/17 0425  . enoxaparin (LOVENOX) injection 40 mg  40 mg Subcutaneous Q24H Georgette Shell, MD      . gabapentin (NEURONTIN) capsule 600 mg  600 mg Oral TID Jillyn Ledger, PA-C   600 mg at 08/24/17 0932  . mirtazapine (REMERON) tablet 15 mg  15 mg Oral QHS Jillyn Ledger, PA-C   15 mg at 08/23/17 2202  . ondansetron (ZOFRAN) injection 4 mg  4 mg Intravenous Q8H PRN Rolland Porter, MD      . sodium chloride 0.9 % bolus 500 mL  500 mL Intravenous Once Rolland Porter, MD   Stopped at 08/24/17 [phone]   And  . sodium chloride 0.9 % bolus 250 mL  250 mL Intravenous Once Rolland Porter, MD   Stopped at 08/24/17 6433   Facility-Administered Medications Ordered in Other Encounters  Medication Dose Route Frequency Provider Last Rate Last Dose  . sodium chloride flush (NS) 0.9 % injection 10 mL  10 mL Intracatheter PRN Nicholas Lose, MD   10 mL at 12/25/16 1702    Musculoskeletal: Strength & Muscle Tone: decreased due to physical deconditioning.  Gait & Station: UTA since lying in bed. Patient leans: N/A  Psychiatric Specialty Exam: Physical Exam  Nursing note and vitals reviewed. Constitutional: She is oriented to person, place, and time. She appears well-developed.  Thin  HENT:  Head: Normocephalic and atraumatic.  Neck: Normal range of motion.  Respiratory: Effort normal.  Musculoskeletal: Normal range of motion.   Neurological: She is alert and oriented to person, place, and time.  Psychiatric: Her speech is normal and behavior is normal. Thought content normal. Cognition and memory are normal. She expresses impulsivity. She exhibits a depressed mood.    Review of Systems  Psychiatric/Behavioral: Positive for depression and substance abuse. Negative for hallucinations and suicidal ideas. The patient does not have insomnia.   All other systems reviewed and are negative.   Blood pressure 140/66, pulse (!) 102, temperature 99.1 F (37.3 C), temperature source Oral, resp. rate 20, height 5' 1"  (1.549 m), weight 45.2 kg (99 lb 10.4 oz), SpO2 100 %.Body mass index is 18.83 kg/m.  General Appearance: Fairly Groomed, thin, middle aged, African American female who is lying in bed. NAD.   Eye Contact:  Good  Speech:  Clear and Coherent and Normal Rate  Volume:  Normal  Mood:  "Better"  Affect:  Constricted  Thought Process:  Goal Directed, Linear and Descriptions of Associations: Intact  Orientation:  Full (Time, Place, and Person)  Thought Content:  Logical  Suicidal Thoughts:  No  Homicidal Thoughts:  No  Memory:  Immediate;   Good Recent;   Good Remote;   Good  Judgement:  Fair  Insight:  Fair  Psychomotor Activity:  Normal  Concentration:  Concentration: Good and Attention Span: Good  Recall:  Good  Fund of Knowledge:  Good  Language:  Good  Akathisia:  No  Handed:  Right  AIMS (if indicated):   N/A  Assets:  Communication Skills Housing Social Support  ADL's:  Intact  Cognition:  WNL  Sleep:   Okay   Assessment:  Ann Oliver is a 62 y.o. female who was admitted with SI in the setting of poorly managed pain.  She denies SI today since admission to the hospital because her pain has improved. She denies HI or AVH. She does not warrant inpatient psychiatric hospitalization at this time. She should continue to see her outpatient provider for pain management since this is largely  contributing to her mood and intermittent SI. It may be difficult to find a balance between adequately managing her pain and reducing the risk for medication misuse to treat poorly managed pain. She will need to continue to work closely with her provider to find the optimal medication regimen.   Treatment Plan Summary: -Continue Remeron 15 mg qhs for depression, anxiety and appetite stimulation. -Patient is psychiatrically cleared. Psychiatry will sign off on patient at this time. Please consult psychiatry again as needed.   Disposition: No evidence of imminent risk to self or others at present.   Patient does not meet criteria for psychiatric inpatient admission.  Faythe Dingwall, DO 08/24/2017 10:34 AM

## 2017-08-24 NOTE — Progress Notes (Signed)
[facility] 1330- [facility] ([facility]) SW Visit @ 02:05pm.  LCSW presented in Pt room and Pt was asleep. LCSW gently aroused Pt and Pt woke up but dozed back to sleep shortly after. Pt then got up to use the rest room and did not make it to the bathroom before urinating on herself. Pt denied pain initially but during the visit noted pain in her right under arm area. LCSW discussed Pt goals and emotional status. Pt notes feeling anxious and emphasizes importance of managing her pain effectively and states wanting to go home. LCSW inquired about Pt trip to the ER yesterday and she denies knowing why she came and denied suicidal ideation. Pt denies suicidal ideation and became upset later in the conversation regarding discussion implicating death and dying. Pt notes that she is not going to die and discussions surrounding these topics are agitating to her. Pt notes desire for non-oral pain medication, noting a patch or pump. LCSW agreed to alert primary [facility] RN of this request. Pt aware of likely discharge tomorrow to her home where Pt lives alone. Pt does not desire to speak about her family helping her though she states that there is family to assist. LCSW uncertain about this but Pt notes desire to return to her home. Pt notes eating and drinking well in the hospital. LCSW coordinated with SW, Meghan, RN, Legrand Rams, and Engineer, manufacturing, [employer] regarding Pt. Pt aware that LCSW will follow up regarding needs post discharge and Pt once again noted desire for non oral pain medication prior to discharge. LCSW to follow up with primary RN and hospital liaison RN with update from LCSW visit.  Christena Deem, LCSW Social Worker [facility] ext: [phone]

## 2017-08-24 NOTE — Progress Notes (Signed)
   08/24/17 1200  Clinical Encounter Type  Visited With Patient  Visit Type Follow-up;Psychological support;Spiritual support  Referral From Nurse  Consult/Referral To Chaplain  Spiritual Encounters  Spiritual Needs Emotional;Other (Comment) (Spiritual Care Conversation/Support)  Stress Factors  Patient Stress Factors Health changes;Major life changes   I visited with the patient per [place]. I have visited with the patient multiple times throughout her Visits to [facility].  Today the patient was concerned about her pain management. The patient stated that she gets depressed when her "pain is out of control." The patient stated that it is hard for her to remember to take her medications, and that she is hoping for another option that she "won't forget."   Please, contact Spiritual Care for further assistance.   Chaplain Shanon Ace M.Div., [facility]

## 2017-08-24 NOTE — Progress Notes (Signed)
08/24/17 1000 Per Dr Mariea Clonts patient does not require a sitter.

## 2017-08-24 NOTE — Progress Notes (Signed)
Patient refused her chest dressing to be opened, for RN to assess breast tumor, states they changed it yesterday,

## 2017-08-25 DIAGNOSIS — R509 Fever, unspecified: Principal | ICD-10-CM

## 2017-08-25 DIAGNOSIS — D638 Anemia in other chronic diseases classified elsewhere: Secondary | ICD-10-CM

## 2017-08-25 DIAGNOSIS — G893 Neoplasm related pain (acute) (chronic): Secondary | ICD-10-CM

## 2017-08-25 DIAGNOSIS — C50919 Malignant neoplasm of unspecified site of unspecified female breast: Secondary | ICD-10-CM

## 2017-08-25 LAB — CBC
HCT: 25.9 % — ABNORMAL LOW (ref 36.0–46.0)
Hemoglobin: 7.6 g/dL — ABNORMAL LOW (ref 12.0–15.0)
MCH: 22.6 pg — ABNORMAL LOW (ref 26.0–34.0)
MCHC: 29.3 g/dL — ABNORMAL LOW (ref 30.0–36.0)
MCV: 77.1 fL — AB (ref 78.0–100.0)
PLATELETS: 711 10*3/uL — AB (ref 150–400)
RBC: 3.36 MIL/uL — ABNORMAL LOW (ref 3.87–5.11)
RDW: 17.8 % — ABNORMAL HIGH (ref 11.5–15.5)
WBC: 15.4 10*3/uL — AB (ref 4.0–10.5)

## 2017-08-25 LAB — URINE CULTURE: Culture: <10000 — AB

## 2017-08-25 MED ORDER — POLYETHYLENE GLYCOL 3350 17 G PO PACK: 17.0000 g | PACK | Freq: Every day | ORAL | Status: DC | PRN

## 2017-08-25 MED ORDER — MORPHINE SULFATE ER 15 MG PO TBCR
15.0000 mg | EXTENDED_RELEASE_TABLET | Freq: Two times a day (BID) | ORAL | Status: DC
  Administered 2017-08-25 – 2017-08-26 (×3): 15 mg via ORAL
  Filled 2017-08-25 (×3): qty 1

## 2017-08-25 MED ORDER — SENNA 8.6 MG PO TABS: 2.0000 | ORAL_TABLET | Freq: Every day | ORAL | Status: DC

## 2017-08-25 MED ORDER — OXYCODONE-ACETAMINOPHEN 5-325 MG PO TABS
1.0000 | ORAL_TABLET | Freq: Four times a day (QID) | ORAL | Status: DC | PRN
  Administered 2017-08-25: 1 via ORAL
  Administered 2017-08-25: 2 via ORAL
  Administered 2017-08-25 – 2017-08-26 (×5): 1 via ORAL
  Filled 2017-08-25: qty 2
  Filled 2017-08-25 (×2): qty 1
  Filled 2017-08-25: qty 2
  Filled 2017-08-25 (×3): qty 1

## 2017-08-25 NOTE — Progress Notes (Signed)
[facility] PROGRESS NOTE  Ann Oliver KXF:[id number] DOB: [birth year] DOA: 08/23/2017  PCP: [facility], [facility]  Brief History/Interval Summary: 62 year old African-American female with a past medical history of metastatic breast cancer with wound on the chest wall, history of major depression presented with poorly controlled pain along with suicidal ideation.  She was hospitalized for further management.  She also was noted to have fever.  Reason for Visit: Poorly controlled cancer related pain  Consultants: Psychiatry  Procedures: None  Antibiotics: None  Subjective/Interval History: Patient seems to be comfortable.  However she states that the pain medicine that she is currently taking do not last and do not provide consistent relief.  ROS: Denies any nausea or vomiting  Objective:  Vital Signs  Vitals:   08/24/17 1027 08/24/17 1501 08/24/17 1951 08/25/17 0457  BP: 140/66 139/76 117/76 123/66  Pulse: (!) 102 (!) 104 (!) 109 (!) 102  Resp: 20 20 16 14   Temp: 99.1 F (37.3 C) 98.8 F (37.1 C) (!) 100.5 F (38.1 C) 99.3 F (37.4 C)  TempSrc: Oral Oral Oral Oral  SpO2: 100% 100% 100% 100%  Weight: 45.2 kg (99 lb 10.4 oz)     Height: 5\' 1"  (1.549 m)       Intake/Output Summary (Last 24 hours) at 08/25/2017 1333 Last data filed at 08/25/2017 0820 Gross per 24 hour  Intake 3555 ml  Output -  Net 3555 ml   Filed Weights   08/24/17 1027  Weight: 45.2 kg (99 lb 10.4 oz)    General appearance: alert, cooperative, cachectic.  In no distress. Head: Normocephalic, without obvious abnormality, atraumatic Resp: clear to auscultation bilaterally Cardio: S1-S2 normal regular.  No S3-S4.  No rubs murmurs of bruit GI: soft, non-tender; bowel sounds normal; no masses,  no organomegaly Extremities: extremities normal, atraumatic, no cyanosis or edema Neurologic: No obvious focal neurological deficits noted  Lab Results:  Data Reviewed: I have  personally reviewed following labs and imaging studies  CBC: Recent Labs  Lab 08/23/17 0254 08/23/17 1438 08/24/17 0547 08/25/17 0356  WBC 16.7* 16.3* 17.0* 15.4*  NEUTROABS  --   --  15.2*  --   HGB 8.8* 8.6* 9.6* 7.6*  HCT 29.7* 29.6* 31.5* 25.9*  MCV 77.5* 77.7* 77.0* 77.1*  PLT 800* 842* 895* 711*    Basic Metabolic Panel: Recent Labs  Lab 08/23/17 0254 08/23/17 1438 08/24/17 0547  NA 133* 139 139  K 3.9 4.5 4.0  CL 101 106 105  CO2 20* 24 24  GLUCOSE 115* 104* 110*  BUN 19 22* 15  CREATININE 0.88 0.69 0.77  CALCIUM 9.0 9.3 9.0    GFR: Estimated Creatinine Clearance: 51.4 mL/min (by C-G formula based on SCr of 0.77 mg/dL).  Liver Function Tests: Recent Labs  Lab 08/23/17 1438 08/24/17 0547  AST 21 21  ALT 37 34  ALKPHOS 356* 402*  BILITOT 0.2* 0.4  PROT 7.6 8.2*  ALBUMIN 3.0* 3.2*    Cardiac Enzymes: Recent Labs  Lab 08/23/17 0254  TROPONINI <0.03     Recent Results (from the past 240 hour(s))  Blood Culture (routine x 2)     Status: None (Preliminary result)   Collection Time: 08/24/17  5:47 AM  Result Value Ref Range Status   Specimen Description   Final    BLOOD RIGHT HAND Performed at [facility], [place] [address], [place], [place] [address]    Special Requests   Final    BOTTLES DRAWN AEROBIC AND  ANAEROBIC Blood Culture adequate volume Performed at [place] [address], [place], [place] [address]    Culture   Final    NO GROWTH 1 DAY Performed at [facility], [place] [address], [place], [place] [address]    Report Status PENDING  Incomplete  Blood Culture (routine x 2)     Status: None (Preliminary result)   Collection Time: 08/24/17  5:47 AM  Result Value Ref Range Status   Specimen Description   Final    BLOOD RIGHT ANTECUBITAL Performed at [place] [address], [place], [place] [address]    Special Requests   Final    BOTTLES DRAWN AEROBIC AND  ANAEROBIC Blood Culture results may not be optimal due to an excessive volume of blood received in culture bottles Performed at [place] [address], [place], [place] [address]    Culture   Final    NO GROWTH 1 DAY Performed at [facility], [place] [address] [place], [place] [address]    Report Status PENDING  Incomplete  Urine culture     Status: Abnormal   Collection Time: 08/24/17  7:00 AM  Result Value Ref Range Status   Specimen Description   Final    URINE, CATHETERIZED Performed at [place] [address], [place], [place] [address]    Special Requests   Final    NONE Performed at [facility], [place] [address] [place], [place] [address]    Culture (A)  Final    <10,000 COLONIES/mL INSIGNIFICANT GROWTH Performed at [place] [address], [place], [place] [address]    Report Status 08/25/2017 FINAL  Final      Radiology Studies: Dg Chest 2 View  Result Date: 08/24/2017 CLINICAL DATA:  62 year old female with fever and tachycardia. History of breast cancer. EXAM: CHEST - 2 VIEW COMPARISON:  Chest radiograph dated 08/23/2017 and CT dated 04/20/2017 FINDINGS: Multiple bilateral pulmonary nodules as seen on the earlier radiograph and consistent with metastatic disease. There is mild eventration of the left hemidiaphragm with minimal left lung base atelectatic changes. No new consolidative changes. There is no pleural effusion or pneumothorax. The cardiac silhouette is within normal limits. No acute osseous pathology. Left chest wall irregular soft tissue mass and dressing. IMPRESSION: 1. No acute cardiopulmonary process. 2. Bilateral pulmonary metastatic disease. 3. Left chest wall soft tissue mass and overlying dressing Electronically Signed   By: Anner Crete M.D.   On: 08/24/2017 04:24     Medications:  Scheduled: . enoxaparin (LOVENOX) injection  40 mg Subcutaneous Q24H  . gabapentin   600 mg Oral TID  . mirtazapine  15 mg Oral QHS  . morphine  15 mg Oral Q12H  . senna  2 tablet Oral QHS   Continuous: . sodium chloride 100 mL/hr at 08/25/17 0820  . sodium chloride Stopped (08/24/17 0948)   And  . sodium chloride Stopped (08/24/17 0949)   TDD:[id number], oxyCODONE-acetaminophen, polyethylene glycol  Assessment/Plan:  Principal Problem:   Suicidal ideation Active Problems:   Fever    Fever with leukocytosis Reason is not clear.  Could be related to her cancer.  No infectious etiology has been found.  Continue to monitor for now.  Old labs were reviewed.  She has had elevated WBC chronically.  Metastatic breast cancer likely terminal Followed by hospice at home.  She is DNR.  Cancer related pain Appears that this  is poorly controlled.  There has been some concern about patient abusing her medications.  However she mentions suicidal ideation as a result of the pain.  She was seen by psychiatry and does not merit inpatient psychiatric admission.  I think patient needs better pain control.  She is noted to be on only short-acting medications.  She may benefit from long-acting pain control.  Discussed with palliative medicine specialist, Dr. Rowe Pavy.  We will initiate MS Contin.  This can be uptitrated by hospice at home.  Laxatives.  Anemia likely due to chronic disease Hemoglobin noted to be 7.6 this morning.  It was 9.6 yesterday.  No overt bleeding noted.  Likely dilutional drop.  We will recheck tomorrow.  Transfuse if it drops below 7.  History of depression Continue with the Remeron.  Seen by psychiatry.  It is felt that her suicidal ideation was due to poorly controlled pain.  DVT Prophylaxis: Lovenox    Code Status: DNR Family Communication: Discussed with the patient Disposition Plan: Management as outlined above.    LOS: 1 day   [facility] Pager [phone] 08/25/2017, 1:33 PM  If 7PM-7AM, please contact night-coverage at  [email or web address], password [facility]

## 2017-08-25 NOTE — Progress Notes (Signed)
[facility]) RN Visit @ 4196  This is a related and covered hospitalization admission of 08/24/2017 with a [facility] diagnosis of breast cancer per Dr. Tomasa Hosteller.   Patent was transported to [facility] ED 08/23/2017  for treatment of pain unmanaged at home. She previously went to [facility] ED 08/23/2017 earlier in the day and was treated and released. Prior to coming to ED she called triage and requested a call back from her primary RN but the primary RN was unable to reach her on call back. In addition to complaints of pain, patient expressed suicidal ideations due to pain and was evaluated by psychiatry before being admitted as in inpatient for evaluation of fever.   GIP LOS 2  day.  Per bedside RN Verline Lema, Patient had an uneventful night and planned for discharge today.   MD notes: Disposition Plan:She is being admitted because of fever and suicidal ideation. If she remains afebrile and no source of infection has been identified she may be discharged.  Consults:Psychiatry Psychiatry Treatment Plan Summary: -Continue Remeron 15 mg qhs for depression, anxiety and appetite stimulation. -Patient is psychiatrically cleared.Psychiatry will sign off on patient at this time.   Visited with patient who is alert/oriented standing at sink completing her morning self-care in NAD. She was able to ambulate back to bed unassisted with steady gait. She rates her pain currently at a 6 and has recently been medicated with Oxycodone from bedside RN. She states she slept well and ate a good breakfast and has no desire for self harm during our visit.  She also verbalized that she feels "half  as depressed as this weekend since she is getting regular pain medication".  She also added that "I am ready to go home"   Medications: 0.45% sodium chloride @ 100/hr, Lovenox 40mg  [place] QD, Neurontin 600mg  PO TID, Remeron 15mg  QHS, Tylenol 650mg  PO Q6hr PRN last given @ 0608, Oxycodone 10mg   PO Q 6hrs PRN last given @ 0903.   Goals of Care: Patient wants her pain managed, to be comfortable. She has DNR.   [facility] will continue to follow and anticipate discharge needs. Should ambulance transport be needed at time of discharge, please call [facility] as [facility] contracts with [facility] for our patients.  [facility] team is aware of her admission, Left voicemail with [facility] RN about planned discharge for today and patient concerns for pain control once home.   [name] notified of patient admission. Transfer summary and current [facility] medication list placed on shadow chart.  Please call with any hospice related questions or concerns.   ?Gar Ponto, RN [facility] Liaison  [place] are on AMION

## 2017-08-26 LAB — CBC
HEMATOCRIT: 25.9 % — AB (ref 36.0–46.0)
HEMOGLOBIN: 7.6 g/dL — AB (ref 12.0–15.0)
MCH: 22.6 pg — ABNORMAL LOW (ref 26.0–34.0)
MCHC: 29.3 g/dL — AB (ref 30.0–36.0)
MCV: 77.1 fL — ABNORMAL LOW (ref 78.0–100.0)
Platelets: 652 10*3/uL — ABNORMAL HIGH (ref 150–400)
RBC: 3.36 MIL/uL — ABNORMAL LOW (ref 3.87–5.11)
RDW: 17.8 % — ABNORMAL HIGH (ref 11.5–15.5)
WBC: 13.9 10*3/uL — ABNORMAL HIGH (ref 4.0–10.5)

## 2017-08-26 MED ORDER — MORPHINE SULFATE ER 15 MG PO TBCR: 15.0000 mg | EXTENDED_RELEASE_TABLET | Freq: Two times a day (BID) | ORAL | 0 refills | Status: DC

## 2017-08-26 MED ORDER — SENNA 8.6 MG PO TABS: 1.0000 | ORAL_TABLET | Freq: Every day | ORAL | 2 refills | Status: AC

## 2017-08-26 MED ORDER — POLYETHYLENE GLYCOL 3350 17 G PO PACK: 17.0000 g | PACK | Freq: Every day | ORAL | 0 refills | Status: AC | PRN

## 2017-08-26 MED ORDER — MORPHINE SULFATE ER 15 MG PO TBCR: 15.0000 mg | EXTENDED_RELEASE_TABLET | Freq: Two times a day (BID) | ORAL | 0 refills | Status: AC

## 2017-08-26 MED ORDER — GABAPENTIN 600 MG PO TABS: 600.0000 mg | ORAL_TABLET | Freq: Three times a day (TID) | ORAL | 0 refills | Status: AC

## 2017-08-26 NOTE — Discharge Summary (Signed)
[facility]  Physician Discharge Summary   Patient ID: Ann Oliver MRN: [id number] DOB/AGE: 1956-02-12 62 y.o.  Admit date: 08/23/2017 Discharge date: 08/26/2017  PCP: [facility]  DISCHARGE DIAGNOSES:  Principal Problem:   Suicidal ideation Active Problems:   Fever   RECOMMENDATIONS FOR OUTPATIENT FOLLOW UP: 1. Hospice to continue following the patient at home   DISCHARGE CONDITION: fair  Diet recommendation: As before  Filed Weights   08/24/17 1027  Weight: 45.2 kg (99 lb 10.4 oz)    INITIAL HISTORY: 62 year old African-American female with a past medical history of metastatic breast cancer with wound on the chest wall, history of major depression presented with poorly controlled pain along with suicidal ideation.  She was hospitalized for further management.  She also was noted to have fever.  Consultations:  Psychiatry  HOSPITAL COURSE:   Fever with leukocytosis Reason is not clear.  Could be related to her cancer.  No infectious etiology has been found.  Fevers have subsided. Old labs were reviewed.  She has had elevated WBC chronically.  No further workup at this time.  Metastatic breast cancer likely terminal Followed by hospice at home.  She is DNR.  Cancer related pain Her pain is poorly controlled.  Patient noted to be on gabapentin and oxycodone at home.  There is also mention of patient abusing her medications and apparently she gets only 2-week supply at a time.  She was seen by psychiatry since patient was complaining of suicidal ideation.  These thoughts were primarily due to uncontrolled pain.  Psychiatry has cleared her.  It is felt that she may benefit from long-acting pain medications for better control of her cancer pain.  She was started on MS Contin after discussions with palliative medicine.  This will need to be uptitrated by hospice providers at home.  Laxatives.  Anemia likely due to chronic  disease Hemoglobin was likely concentrated at the time of admission.  Drop in hemoglobin noted but stable today.  No overt bleeding.    History of depression Continue with the Remeron.  Seen by psychiatry.  It is felt that her suicidal ideation was due to poorly controlled pain.  Overall stable.  Okay for discharge home today.    PERTINENT LABS:  The results of significant diagnostics from this hospitalization (including imaging, microbiology, ancillary and laboratory) are listed below for reference.    Microbiology: Recent Results (from the past 240 hour(s))  Blood Culture (routine x 2)     Status: None (Preliminary result)   Collection Time: 08/24/17  5:47 AM  Result Value Ref Range Status   Specimen Description   Final    BLOOD RIGHT HAND Performed at [facility] [address] [place], [place] [address]    Special Requests   Final    BOTTLES DRAWN AEROBIC AND ANAEROBIC Blood Culture adequate volume Performed at [facility] [address] [place], [place] [address]    Culture   Final    NO GROWTH 2 DAYS Performed at [place] [address] [place], [place] [address]    Report Status PENDING  Incomplete  Blood Culture (routine x 2)     Status: None (Preliminary result)   Collection Time: 08/24/17  5:47 AM  Result Value Ref Range Status   Specimen Description   Final    BLOOD RIGHT ANTECUBITAL Performed at [place] [address] [place], [place] [address]    Special Requests  Final    BOTTLES DRAWN AEROBIC AND ANAEROBIC Blood Culture results may not be optimal due to an excessive volume of blood received in culture bottles Performed at [place] [address], [place], [place] [address]    Culture   Final    NO GROWTH 2 DAYS Performed at [facility] [address], [place], [place] [address]    Report Status PENDING  Incomplete  Urine culture      Status: Abnormal   Collection Time: 08/24/17  7:00 AM  Result Value Ref Range Status   Specimen Description   Final    URINE, CATHETERIZED Performed at [place] [address], [place], [place] [address]    Special Requests   Final    NONE Performed at [facility], [place] [address], [place], [place] [address]    Culture (A)  Final    <10,000 COLONIES/mL INSIGNIFICANT GROWTH Performed at [place] [address], [place], [place] [address]    Report Status 08/25/2017 FINAL  Final     Labs: Basic Metabolic Panel: Recent Labs  Lab 08/23/17 0254 08/23/17 1438 08/24/17 0547  NA 133* 139 139  K 3.9 4.5 4.0  CL 101 106 105  CO2 20* 24 24  GLUCOSE 115* 104* 110*  BUN 19 22* 15  CREATININE 0.88 0.69 0.77  CALCIUM 9.0 9.3 9.0   Liver Function Tests: Recent Labs  Lab 08/23/17 1438 08/24/17 0547  AST 21 21  ALT 37 34  ALKPHOS 356* 402*  BILITOT 0.2* 0.4  PROT 7.6 8.2*  ALBUMIN 3.0* 3.2*   CBC: Recent Labs  Lab 08/23/17 0254 08/23/17 1438 08/24/17 0547 08/25/17 0356 08/26/17 0335  WBC 16.7* 16.3* 17.0* 15.4* 13.9*  NEUTROABS  --   --  15.2*  --   --   HGB 8.8* 8.6* 9.6* 7.6* 7.6*  HCT 29.7* 29.6* 31.5* 25.9* 25.9*  MCV 77.5* 77.7* 77.0* 77.1* 77.1*  PLT 800* 842* 895* 711* 652*   Cardiac Enzymes: Recent Labs  Lab 08/23/17 0254  TROPONINI <0.03    IMAGING STUDIES Dg Chest 2 View  Result Date: 08/24/2017 CLINICAL DATA:  62 y.o. female with fever and tachycardia and tachycardia. History of breast cancer. EXAM: CHEST - 2 VIEW COMPARISON:  Chest radiograph dated 08/23/2017 and CT dated 04/20/2017 FINDINGS: Multiple bilateral pulmonary nodules as seen on the earlier radiograph and consistent with metastatic disease. There is mild eventration of the left hemidiaphragm with minimal left lung base atelectatic changes. No new consolidative changes. There is no pleural effusion or pneumothorax. The cardiac silhouette is within  normal limits. No acute osseous pathology. Left chest wall irregular soft tissue mass and dressing. IMPRESSION: 1. No acute cardiopulmonary process. 2. Bilateral pulmonary metastatic disease. 3. Left chest wall soft tissue mass and overlying dressing Electronically Signed   By: Anner Crete M.D.   On: 08/24/2017 04:24   Dg Chest 2 View  Result Date: 08/23/2017 CLINICAL DATA:  Chest pain for 3-7 days of dyspnea. EXAM: CHEST - 2 VIEW COMPARISON:  05/12/2017 FINDINGS: The heart size and mediastinal contours are within normal limits. Mild aortic atherosclerosis at the arch without aneurysm. Interval progression of metastatic pulmonary disease with increase in size of bilateral pulmonary masslike opacities. Dressing projects over the lateral aspect of the left hemithorax were there is asymmetric soft tissue prominence relative to the right chest wall. No aggressive osseous lesions identified. IMPRESSION: 1. Interval increase in size of several bilateral pulmonary masslike  opacities compatible with worsening metastatic disease. 2. Dressing overlies the left lateral aspect of the chest wall with asymmetric soft tissue prominence of the left lateral chest wall relative to right. Electronically Signed   By: Ashley Royalty M.D.   On: 08/23/2017 03:54    DISCHARGE EXAMINATION: Vitals:   08/25/17 0457 08/25/17 1516 08/25/17 2100 08/26/17 0443  BP: 123/66 133/64 124/70 117/71  Pulse: (!) 102 (!) 103 100 95  Resp: 14 16 14 12   Temp: 99.3 F (37.4 C) 99.7 F (37.6 C) 99.4 F (37.4 C) 99 F (37.2 C)  TempSrc: Oral Oral Oral Oral  SpO2: 100% 100% 100% 99%  Weight:      Height:       General appearance: alert, cooperative, appears stated age and no distress Resp: clear to auscultation bilaterally Cardio: regular rate and rhythm, S1, S2 normal, no murmur, click, rub or gallop GI: soft, non-tender; bowel sounds normal; no masses,  no organomegaly  DISPOSITION: Home  Discharge Instructions    Discharge  instructions   Complete by:  As directed    You were cared for by a hospitalist during your hospital stay. If you have any questions about your discharge medications or the care you received while you were in the hospital after you are discharged, you can call the unit and asked to speak with the hospitalist on call if the hospitalist that took care of you is not available. Once you are discharged, your primary care physician will handle any further medical issues. Please note that NO REFILLS for any discharge medications will be authorized once you are discharged, as it is imperative that you return to your primary care physician (or establish a relationship with a primary care physician if you do not have one) for your aftercare needs so that they can reassess your need for medications and monitor your lab values. If you do not have a primary care physician, you can call [phone] for a physician referral.   Increase activity slowly   Complete by:  As directed         Allergies as of 08/26/2017      Reactions   Aspirin Palpitations      Medication List    TAKE these medications   gabapentin 600 MG tablet Commonly known as:  NEURONTIN Take 1 tablet (600 mg total) by mouth 3 (three) times daily for 15 days.   loperamide 2 MG capsule Commonly known as:  IMODIUM Take 1 capsule (2 mg total) by mouth as needed for diarrhea or loose stools.   mirtazapine 15 MG tablet Commonly known as:  REMERON Take 1 tablet (15 mg total) by mouth at bedtime.   morphine 15 MG 12 hr tablet Commonly known as:  MS CONTIN Take 1 tablet (15 mg total) by mouth every 12 (twelve) hours for 15 days.   multivitamin with minerals Tabs tablet Take 1 tablet by mouth daily.   ondansetron 8 MG tablet Commonly known as:  ZOFRAN Take 1 tablet (8 mg total) by mouth every 8 (eight) hours as needed for nausea or vomiting.   Oxycodone HCl 10 MG Tabs Take 1 tablet (10 mg total) by mouth every 4 (four) hours as  needed. What changed:  reasons to take this   polyethylene glycol packet Commonly known as:  MIRALAX / GLYCOLAX Take 17 g by mouth daily as needed for moderate constipation.   senna 8.6 MG Tabs tablet Commonly known as:  SENOKOT Take 1 tablet (8.6 mg total)  by mouth at bedtime.        TOTAL DISCHARGE TIME: 35 mins  [facility] Pager [phone]  08/26/2017, 2:31 PM

## 2017-08-26 NOTE — Progress Notes (Addendum)
[facility] RN Visit @0830   This is a related and covered hospitalization admission of04/02/2019with a [facility] diagnosis ofbreast cancerper Dr. Tomasa Hosteller.   Patent was transported to [facility] ED 08/23/2017 for treatment of pain unmanaged at home. She previously went to [facility] ED 08/23/2017 earlier in the day and was treated and released. Prior to coming to ED she called triage and requested a call back from her primary RN but the primary RN was unable to reach her on call back. In addition to complaints of pain, patient expressed suicidal ideations due to pain and was evaluated by psychiatry before being admitted as in inpatient for evaluation of fever.   GIP LOS 3  day.  Plan to discharge today.  Discharge medications MS Contin 15mg  BID, gabapentin 600mg  TID with a 2 week supply of both.   Visited patient at bedside. She is alert and oriented. She does not appear in distress and states she is feeling better although she rates her pain 9/0-10.  Dressing is dry and intact. She states she is ready to go home.   Medications: MS Contin 15mg  BID, Lovenox 40mg  [place] QD, Neurontin 600mg  PO TID, Remeron 15mg  QHS, , Percocet 5-325 PO Q 6hrs PRN last given @ 0736,   Goals of Care: Patient wants her pain managed, to be comfortable. She has DNR.   [facility] team updated on anticipated discharge. Pain medications reviewed with primary RN and Dr. Tomasa Hosteller.   [facility] will continue to follow and anticipate needs. CSW will arrange taxi voucher for patient transportation home.   Dr.Gudenanotified of patient admission. Transfer summary and current [facility] medication list placed on shadow chart.  Please call with any hospice related questions or concerns.   ?Farrel Gordon, RN, [facility] Liaison  [place] are on AMION

## 2017-08-29 LAB — CULTURE, BLOOD (ROUTINE X 2)
Culture: NO GROWTH
Culture: NO GROWTH
Special Requests: ADEQUATE

## 2017-09-09 ENCOUNTER — Telehealth: Payer: Self-pay | Admitting: Medical Oncology

## 2017-09-09 ENCOUNTER — Other Ambulatory Visit: Payer: Self-pay | Admitting: *Deleted

## 2017-09-09 ENCOUNTER — Encounter: Payer: Self-pay | Admitting: General Practice

## 2017-09-09 NOTE — Progress Notes (Signed)
[facility] CSW Progress Note  Spoke w hospice CSW Joellen Jersey [phone]), [facility] has been actively monitoring patient situation since hospital discharge but was not aware of impending eviction/utility disconnection until today.  Patient ives alone, is able to ambulate to nearby store, cares for her own needs.  Per hospice CSW, patient is stable at this point and able to make her own decisions.  Hospice has been actively involved w patient but were not aware of dire financial circumstances until today.  Since finding out situation, hospice staff have been working w patient to identify other resources who can assist her - patient does have family both here (no room for her w local relatives) and in [place] (may have room to house her) but does not have phone numbers for out of town family.  Hospice is working on helping patient transition to SNF if desired, using her Medicaid.  Have instructed patient to call her Medicaid worker who can start the process of getting long term Medicaid needed for SNF placement.  Hospice MD will complete FL2 required for placement.  Per Hospice CSW, they would appreciate any resources for financial assistance that would help stabilize housing situation so that patient can transition to SNF in an orderly fashion.   Hospice has provided resources for patient to stabilize her temporarily, over weekend; have told patient that due to the Easter holiday, continued work on her case will be deferred until Easter Monday.  CSW will message CSW Wallis Bamberg to contact hospice CSW on Monday.  Will also message [employer] and our Financial Advocates to determine if there are any financial resources that could assist her.  Edwyna Shell, LCSW Clinical Social Worker Phone:  [phone]

## 2017-09-09 NOTE — Telephone Encounter (Signed)
Pt is under hospice care. Pt says she needs to go to nursing home because she is going to be evicted , cut back on her food stamps and has no electricity. She does not know what to do. Per Hospice pt has called 10 times today and the nurse was going to get back to her. Pt wants a doctor order to go to a nursing home. I left message for Leveda Anna to call me back. Note cc social worker

## 2017-09-12 ENCOUNTER — Emergency Department (HOSPITAL_COMMUNITY)
Admission: EM | Admit: 2017-09-12 | Discharge: 2017-09-14 | Disposition: A | Discharge status: Home / Self Care | Payer: Medicaid Other | Attending: Emergency Medicine | Admitting: Emergency Medicine

## 2017-09-12 ENCOUNTER — Encounter (HOSPITAL_COMMUNITY): Payer: Self-pay | Admitting: Emergency Medicine

## 2017-09-12 DIAGNOSIS — R45851 Suicidal ideations: Secondary | ICD-10-CM | POA: Insufficient documentation

## 2017-09-12 DIAGNOSIS — Z853 Personal history of malignant neoplasm of breast: Secondary | ICD-10-CM | POA: Diagnosis not present

## 2017-09-12 DIAGNOSIS — Z046 Encounter for general psychiatric examination, requested by authority: Secondary | ICD-10-CM | POA: Diagnosis not present

## 2017-09-12 DIAGNOSIS — F329 Major depressive disorder, single episode, unspecified: Secondary | ICD-10-CM | POA: Diagnosis not present

## 2017-09-12 DIAGNOSIS — C7800 Secondary malignant neoplasm of unspecified lung: Secondary | ICD-10-CM | POA: Insufficient documentation

## 2017-09-12 DIAGNOSIS — Z79899 Other long term (current) drug therapy: Secondary | ICD-10-CM | POA: Diagnosis not present

## 2017-09-12 LAB — CBC
HEMATOCRIT: 27.7 % — AB (ref 36.0–46.0)
HEMOGLOBIN: 8 g/dL — AB (ref 12.0–15.0)
MCH: 22 pg — ABNORMAL LOW (ref 26.0–34.0)
MCHC: 28.9 g/dL — AB (ref 30.0–36.0)
MCV: 76.3 fL — AB (ref 78.0–100.0)
Platelets: 753 10*3/uL — ABNORMAL HIGH (ref 150–400)
RBC: 3.63 MIL/uL — ABNORMAL LOW (ref 3.87–5.11)
RDW: 17.7 % — AB (ref 11.5–15.5)
WBC: 17.9 10*3/uL — ABNORMAL HIGH (ref 4.0–10.5)

## 2017-09-12 LAB — COMPREHENSIVE METABOLIC PANEL
ALBUMIN: 2.5 g/dL — AB (ref 3.5–5.0)
ALT: 19 U/L (ref 14–54)
AST: 21 U/L (ref 15–41)
Alkaline Phosphatase: 232 U/L — ABNORMAL HIGH (ref 38–126)
Anion gap: 13 (ref 5–15)
BILIRUBIN TOTAL: 0.3 mg/dL (ref 0.3–1.2)
BUN: 15 mg/dL (ref 6–20)
CALCIUM: 8.7 mg/dL — AB (ref 8.9–10.3)
CO2: 19 mmol/L — AB (ref 22–32)
Chloride: 101 mmol/L (ref 101–111)
Creatinine, Ser: 0.77 mg/dL (ref 0.44–1.00)
GFR calc Af Amer: >60 mL/min (ref >60)
GFR calc non Af Amer: >60 mL/min (ref >60)
GLUCOSE: 87 mg/dL (ref 65–99)
Potassium: 3.8 mmol/L (ref 3.5–5.1)
Sodium: 133 mmol/L — ABNORMAL LOW (ref 135–145)
TOTAL PROTEIN: 7 g/dL (ref 6.5–8.1)

## 2017-09-12 LAB — SALICYLATE LEVEL: Salicylate Lvl: <7 mg/dL (ref 2.8–30.0)

## 2017-09-12 LAB — ETHANOL: Alcohol, Ethyl (B): <10 mg/dL (ref <10)

## 2017-09-12 LAB — ACETAMINOPHEN LEVEL: Acetaminophen (Tylenol), Serum: 13 ug/mL (ref 10–30)

## 2017-09-12 MED ORDER — OXYCODONE HCL 5 MG PO TABS
10.0000 mg | ORAL_TABLET | ORAL | Status: DC | PRN
Start: 2017-09-12 — End: 2017-09-12

## 2017-09-12 MED ORDER — MIRTAZAPINE 15 MG PO TABS
15.0000 mg | ORAL_TABLET | Freq: Every day | ORAL | Status: DC
  Administered 2017-09-13 (×2): 15 mg via ORAL
  Filled 2017-09-12 (×3): qty 1

## 2017-09-12 MED ORDER — OXYCODONE HCL 5 MG PO TABS
10.0000 mg | ORAL_TABLET | ORAL | Status: DC | PRN
  Administered 2017-09-12 – 2017-09-14 (×12): 10 mg via ORAL
  Filled 2017-09-12 (×12): qty 2

## 2017-09-12 MED ORDER — ACETAMINOPHEN 325 MG PO TABS
325.0000 mg | ORAL_TABLET | Freq: Once | ORAL | Status: AC
  Administered 2017-09-13: 325 mg via ORAL
  Filled 2017-09-12: qty 1

## 2017-09-12 MED ORDER — GABAPENTIN 600 MG PO TABS
600.0000 mg | ORAL_TABLET | Freq: Three times a day (TID) | ORAL | Status: DC
  Administered 2017-09-12 – 2017-09-14 (×7): 600 mg via ORAL
  Filled 2017-09-12 (×9): qty 1

## 2017-09-12 MED ORDER — ACETAMINOPHEN 325 MG PO TABS
650.0000 mg | ORAL_TABLET | Freq: Once | ORAL | Status: AC
  Administered 2017-09-12: 650 mg via ORAL
  Filled 2017-09-12: qty 2

## 2017-09-12 MED ORDER — ONDANSETRON HCL 4 MG PO TABS
8.0000 mg | ORAL_TABLET | Freq: Three times a day (TID) | ORAL | Status: DC | PRN
  Administered 2017-09-12: 8 mg via ORAL
  Filled 2017-09-12: qty 2

## 2017-09-12 MED ORDER — CLINDAMYCIN HCL 150 MG PO CAPS: 300.0000 mg | ORAL_CAPSULE | Freq: Once | ORAL | Status: DC

## 2017-09-12 NOTE — ED Triage Notes (Signed)
Per EMS:  Patient presents to ED for assessment of SI with hx of breast cancer with invasive wound to left breast and armpit.  Patient states Hospice has assisted her with finding housing and are handling her pain medications at this time, but patient states she can't take it anymore.

## 2017-09-12 NOTE — BH Assessment (Signed)
Tele Assessment Note   Patient Name: Ann Oliver MRN: [id number] Referring Physician: Leonette [facility] Location of Patient: [facility] Location of Provider: [facility]  Ann Oliver is a 62 y.o. female, in ED due to SI. Pt has end-stage metastatic breast cancer and associated chronic pain. Pt is also in the process of getting evicted from her home. Pt reports being prescribed depression medication but reports only taking it when she feels bad. Pt is variable in her disclosures. She reports to this clinician that she thinks about overdosing on her meds "more than halfway" of the time, with the last time having that thought being several days ago. She reports to NP, who assessed her directly after, that she is "100%" feeling suicidal currently. Pt will not definitively say if she feels this way due to any recent events, such as her impending eviction, or her ongoing cancer battle. Pt also reports that "at night, when I close my eyes", she hears things and sees things. When asked what she hears and sees, pt says "just kill yourself". When asked what she sees, pt says "ways to kill myself, I guess". Pt doesn't appear to be responding to any internal stimuli or operating in delusional thought content.   Case staffed with Marvia Pickles, NP, who also assessed pt. Pt is recommended, at this time, for IP psych treatment.   Diagnosis: MDD, recurrent episode, severe, w/ psychotic features  Past Medical History:  Past Medical History:  Diagnosis Date  . Breast cancer (HCC)    METS TO LUNGS  . Sinusitis   . Tylenol overdose 05/2017    Past Surgical History:  Procedure Laterality Date  . BIOPSY BREAST    . IR FLUORO GUIDE CV LINE RIGHT  11/06/2016  . IR US GUIDE VASC ACCESS RIGHT  11/06/2016    Family History:  Family History  Family history unknown: Yes    Social History:  reports that she has never smoked. She has never used smokeless tobacco. She reports that she does not drink  alcohol or use drugs.  Additional Social History:  Alcohol / Drug Use Pain Medications: See MAR Prescriptions: See MAR Over the Counter: See MAR History of alcohol / drug use?: No history of alcohol / drug abuse  CIWA: CIWA-Ar BP: 124/65 Pulse Rate: 91 COWS:    Allergies:  Allergies  Allergen Reactions  . Aspirin Palpitations    Home Medications:  (Not in a hospital admission)  OB/GYN Status:  No LMP recorded. Patient is postmenopausal.  General Assessment Data Location of Assessment: [facility] ED TTS Assessment: In system Is this a Tele or Face-to-Face Assessment?: Tele Assessment Is this an Initial Assessment or a Re-assessment for this encounter?: Initial Assessment Marital status: Single Is patient pregnant?: No Pregnancy Status: No Living Arrangements: Alone Can pt return to current living arrangement?: Yes Admission Status: Voluntary Is patient capable of signing voluntary admission?: Yes Referral Source: Self/Family/Friend     Crisis Care Plan Living Arrangements: Alone Name of Psychiatrist: None Name of Therapist: None  Education Status Is patient currently in school?: No Is the patient employed, unemployed or receiving disability?: Receiving disability income  Risk to self with the past 6 months Suicidal Ideation: Yes-Currently Present Has patient been a risk to self within the past 6 months prior to admission? : Yes Suicidal Intent: Yes-Currently Present Has patient had any suicidal intent within the past 6 months prior to admission? : Yes Is patient at risk for suicide?: Yes Suicidal Plan?: Yes-Currently Present Has patient  had any suicidal plan within the past 6 months prior to admission? : Yes Specify Current Suicidal Plan: OD on meds Access to Means: Yes Specify Access to Suicidal Means: meds Previous Attempts/Gestures: Yes How many times?: 1 Triggers for Past Attempts: Other (Comment) Intentional Self Injurious Behavior: None Family Suicide  History: No Recent stressful life event(s): Other (Comment)(impending eviction; terminal disease) Persecutory voices/beliefs?: Yes Depression: Yes Depression Symptoms: Tearfulness, Loss of interest in usual pleasures, Feeling worthless/self pity Substance abuse history and/or treatment for substance abuse?: No Suicide prevention information given to non-admitted patients: Not applicable  Risk to Others within the past 6 months Homicidal Ideation: No Does patient have any lifetime risk of violence toward others beyond the six months prior to admission? : No Thoughts of Harm to Others: No Current Homicidal Intent: No Current Homicidal Plan: No Access to Homicidal Means: No History of harm to others?: No Assessment of Violence: None Noted Does patient have access to weapons?: No Criminal Charges Pending?: No Does patient have a court date: No Is patient on probation?: No  Psychosis Hallucinations: Auditory, Visual Delusions: None noted  Mental Status Report Appearance/Hygiene: Unremarkable Eye Contact: Fair Motor Activity: Unremarkable Speech: Soft, Slow Level of Consciousness: Quiet/awake Mood: Depressed Affect: Appropriate to circumstance Anxiety Level: None Thought Processes: Coherent, Relevant Judgement: Partial Orientation: Person, Place, Time, Situation Obsessive Compulsive Thoughts/Behaviors: None  Cognitive Functioning Concentration: Fair Memory: Recent Intact, Remote Intact Is patient IDD: No Is patient DD?: No Insight: Poor Impulse Control: Unable to Assess Appetite: Fair Have you had any weight changes? : No Change Sleep: Unable to Assess Vegetative Symptoms: Unable to Assess  ADLScreening [facility] Assessment Services) Patient's cognitive ability adequate to safely complete daily activities?: Yes Patient able to express need for assistance with ADLs?: Yes Independently performs ADLs?: Yes (appropriate for developmental age)  Prior Inpatient Therapy Prior  Inpatient Therapy: Yes Prior Therapy Dates: 2018  Prior Outpatient Therapy Prior Outpatient Therapy: No Does patient have an ACCT team?: No Does patient have Intensive In-House Services?  : No Does patient have [facility] services? : No Does patient have P4CC services?: No  ADL Screening (condition at time of admission) Patient's cognitive ability adequate to safely complete daily activities?: Yes Is the patient deaf or have difficulty hearing?: No Does the patient have difficulty seeing, even when wearing glasses/contacts?: No Does the patient have difficulty concentrating, remembering, or making decisions?: Yes Patient able to express need for assistance with ADLs?: Yes Does the patient have difficulty dressing or bathing?: No Independently performs ADLs?: Yes (appropriate for developmental age) Does the patient have difficulty walking or climbing stairs?: Yes Weakness of Legs: Both Weakness of Arms/Hands: Both  Home Assistive Devices/Equipment Home Assistive Devices/Equipment: None    Abuse/Neglect Assessment (Assessment to be complete while patient is alone) Physical Abuse: Denies Verbal Abuse: Denies Sexual Abuse: Denies Exploitation of patient/patient's resources: Denies Self-Neglect: Denies Values / Beliefs Cultural Requests During Hospitalization: None Spiritual Requests During Hospitalization: None   Advance Directives (For Healthcare) Does Patient Have a Medical Advance Directive?: No Would patient like information on creating a medical advance directive?: No - Patient declined          Disposition:  Disposition Initial Assessment Completed for this Encounter: Yes  This service was provided via telemedicine using a 2-way, interactive audio and video technology.  Names of all persons participating in this telemedicine service and their role in this encounter. Name: Marvia Pickles Role: Nurse Practitioner    Aldona Bar Geni Bers 09/12/2017 6:37 PM

## 2017-09-12 NOTE — ED Notes (Signed)
Tracy @ [facility] called.  This facility has been working with patient on emergency housing.   If there are any questions please call her @ [phone].

## 2017-09-12 NOTE — ED Notes (Signed)
Pt transported to pod F room 5 for TTS and medical exam. Pt will be pulled back to Pod C following assessment by EDP. EDP is aware of plan.

## 2017-09-12 NOTE — ED Notes (Signed)
Patient's belongings placed in locker 5

## 2017-09-12 NOTE — Progress Notes (Signed)
1343 -- [facility]) RN   Phone call with Angela Nevin, RN in [place]. Notified of patient's current status with [facility]. Updated Angela Nevin regarding housing concerns and phone calls this morning regarding suicide threat. [facility] SW requesting mental health evaluation.  Angela Nevin, RN informed that plan is for full workup and mental health evaluation. My phone number given as hospital liaison to discuss plan of care as it evolves.  Please call for any hospice related questions or concerns.  Thank you, Margaretmary Eddy, RN, [facility] Liaison

## 2017-09-12 NOTE — ED Provider Notes (Addendum)
[facility] EMERGENCY DEPARTMENT Provider Note  CSN: [id number] Arrival date & time: 09/12/17 1245  Chief Complaint(s) Suicidal  HPI Ann Oliver is a 62 y.o. female   The history is provided by the patient.  Mental Health Problem  Presenting symptoms: suicidal thoughts   Degree of incapacity (severity):  Moderate Onset quality:  Gradual Duration: several days. Timing:  Constant Progression:  Worsening Chronicity:  Recurrent Context: stressful life event   Relieved by:  Nothing Exacerbated by: chronic pain from end stage breast cancer. Associated symptoms: anhedonia   Risk factors: hx of suicide attempts    endorses plan to OD on her pain medications.   Past Medical History Past Medical History:  Diagnosis Date  . Breast cancer (HCC)    METS TO LUNGS  . Sinusitis   . Tylenol overdose 05/2017   Patient Active Problem List   Diagnosis Date Noted  . Fever 08/24/2017  . MDD (major depressive disorder), recurrent episode, moderate ([place]) 06/03/2017  . Suicidal ideation 06/02/2017  . Tylenol overdose 06/01/2017  . Breast mass   . Acute blood loss anemia   . Hemorrhage from wound   . Ascites   . Evaluation by psychiatric service required 04/22/2017  . AKI (acute kidney injury) ([place]) 03/26/2017  . Leg swelling 03/26/2017  . Diarrhea 03/26/2017  . Leukocytosis 03/26/2017  . Breast wound 03/18/2017  . Blood loss anemia 01/18/2017  . Port catheter in place 12/02/2016  . Necrotizing soft tissue infection 11/21/2016  . Wound infection 10/28/2016  . Cellulitis of left breast 10/28/2016  . Encounter for palliative care   . Goals of care, counseling/discussion   . Breast cancer metastasized to lung, right ([place]) 09/25/2016  . Malnutrition of moderate degree 09/25/2016  . Breast cancer of upper-outer quadrant of left female breast ([place]) 10/22/2015  . THYROID NODULE, RIGHT 05/31/2007  . Depressed 05/31/2007  . GERD 05/31/2007  . HOT FLASHES 05/31/2007   . HEADACHE 05/31/2007   Home Medication(s) Prior to Admission medications   Medication Sig Start Date End Date Taking? Authorizing Provider  gabapentin (NEURONTIN) 600 MG tablet Take 1 tablet (600 mg total) by mouth 3 (three) times daily for 15 days. 08/26/17 09/12/17 Yes Bonnielee Haff, MD  loperamide (IMODIUM) 2 MG capsule Take 1 capsule (2 mg total) by mouth as needed for diarrhea or loose stools. 05/28/17  Yes Nicholas Lose, MD  mirtazapine (REMERON) 15 MG tablet Take 1 tablet (15 mg total) by mouth at bedtime. 08/09/17  Yes Nicholas Lose, MD  Multiple Vitamin (MULTIVITAMIN WITH MINERALS) TABS tablet Take 1 tablet by mouth daily. 05/07/17  Yes Bonnielee Haff, MD  ondansetron (ZOFRAN) 8 MG tablet Take 1 tablet (8 mg total) by mouth every 8 (eight) hours as needed for nausea or vomiting. 05/07/17  Yes Bonnielee Haff, MD  Oxycodone HCl 10 MG TABS Take 1 tablet (10 mg total) by mouth every 4 (four) hours as needed. Patient taking differently: Take 10 mg by mouth every 4 (four) hours as needed (pain).  08/14/17  Yes Caccavale, Sophia, PA-C  polyethylene glycol (MIRALAX / GLYCOLAX) packet Take 17 g by mouth daily as needed for moderate constipation. Patient not taking: Reported on 09/12/2017 08/26/17   Bonnielee Haff, MD  senna (SENOKOT) 8.6 MG TABS tablet Take 1 tablet (8.6 mg total) by mouth at bedtime. Patient not taking: Reported on 09/12/2017 08/26/17   Bonnielee Haff, MD  Past Surgical History Past Surgical History:  Procedure Laterality Date  . BIOPSY BREAST    . IR FLUORO GUIDE CV LINE RIGHT  11/06/2016  . IR US GUIDE VASC ACCESS RIGHT  11/06/2016   Family History Family History  Family history unknown: Yes    Social History Social History   Tobacco Use  . Smoking status: Never Smoker  . Smokeless tobacco: Never Used  Substance Use Topics  . Alcohol use:  No  . Drug use: No   Allergies Aspirin  Review of Systems Review of Systems  Skin: Positive for wound (chest wall/ breast/axilla wound).  Psychiatric/Behavioral: Positive for suicidal ideas.   All other systems are reviewed and are negative for acute change except as noted in the HPI  Physical Exam Vital Signs  I have reviewed the triage vital signs BP 124/65 (BP Location: Right Arm)   Pulse 91   Temp 98.5 F (36.9 C) (Oral)   Resp 16   SpO2 99%   Physical Exam  Constitutional: She is oriented to person, place, and time. She appears well-developed and well-nourished. No distress.  HENT:  Head: Normocephalic and atraumatic.  Nose: Nose normal.  Eyes: Pupils are equal, round, and reactive to light. Conjunctivae and EOM are normal. Right eye exhibits no discharge. Left eye exhibits no discharge. No scleral icterus.  Neck: Normal range of motion. Neck supple.  Cardiovascular: Normal rate and regular rhythm. Exam reveals no gallop and no friction rub.  No murmur heard. Pulmonary/Chest: Effort normal and breath sounds normal. No stridor. No respiratory distress. She has no rales.  Abdominal: Soft. She exhibits no distension. There is no tenderness.  Musculoskeletal: She exhibits no edema or tenderness.  Neurological: She is alert and oriented to person, place, and time.  Skin: Skin is warm and dry. No rash noted. She is not diaphoretic. No erythema.  Psychiatric: Her speech is normal. Her affect is labile. She expresses suicidal ideation. She expresses suicidal plans.  Vitals reviewed.   ED Results and Treatments Labs (all labs ordered are listed, but only abnormal results are displayed) Labs Reviewed  COMPREHENSIVE METABOLIC PANEL - Abnormal; Notable for the following components:      Result Value   Sodium 133 (*)    CO2 19 (*)    Calcium 8.7 (*)    Albumin 2.5 (*)    Alkaline Phosphatase 232 (*)    All other components within normal limits  CBC - Abnormal; Notable for  the following components:   WBC 17.9 (*)    RBC 3.63 (*)    Hemoglobin 8.0 (*)    HCT 27.7 (*)    MCV 76.3 (*)    MCH 22.0 (*)    MCHC 28.9 (*)    RDW 17.7 (*)    Platelets 753 (*)    All other components within normal limits  ETHANOL  SALICYLATE LEVEL  ACETAMINOPHEN LEVEL  RAPID URINE DRUG SCREEN, HOSP PERFORMED  EKG  EKG Interpretation  Date/Time:    Ventricular Rate:    PR Interval:    QRS Duration:   QT Interval:    QTC Calculation:   R Axis:     Text Interpretation:        Radiology No results found. Pertinent labs & imaging results that were available during my care of the patient were reviewed by me and considered in my medical decision making (see chart for details).  Medications Ordered in ED Medications  gabapentin (NEURONTIN) tablet 600 mg (has no administration in time range)  mirtazapine (REMERON) tablet 15 mg (has no administration in time range)  ondansetron (ZOFRAN) tablet 8 mg (has no administration in time range)  Oxycodone HCl TABS 10 mg (has no administration in time range)                                                                                                                                    Procedures Procedures  (including critical care time)  Medical Decision Making / ED Course I have reviewed the nursing notes for this encounter and the patient's prior records (if available in EHR or on provided paperwork).    Patient with end-stage metastatic breast cancer with chronic pain and chest wall wound here for suicidal ideations.  Patient reports that she has pain medicine at home but continues to think about trying to overdose on her pain medication and and that all.   Screening labs at baseline.  [facility] consulted for evaluation.  Patient is under palliative care and I spoke with a nurse who reported that the patient  was apparently being affected.  They have been trying to find the patient's placement but states that it might be until tomorrow before the able to find placement for her.  If she is cleared by psychiatry, discuss with SW regarding placement.  Final Clinical Impression(s) / ED Diagnoses Final diagnoses:  Suicidal ideation      This chart was dictated using voice recognition software.  Despite best efforts to proofread,  errors can occur which can change the documentation meaning.     Fatima Blank, MD 09/12/17 [phone]

## 2017-09-12 NOTE — ED Notes (Signed)
Staffing notified of need for sitter.  

## 2017-09-12 NOTE — ED Notes (Signed)
Reg/no sharps tray ordered

## 2017-09-12 NOTE — ED Notes (Signed)
Patient asked for urine sample, unable to give sample at this time. 

## 2017-09-13 MED ORDER — CLINDAMYCIN HCL 150 MG PO CAPS
300.0000 mg | ORAL_CAPSULE | Freq: Three times a day (TID) | ORAL | Status: DC
  Administered 2017-09-13 – 2017-09-14 (×6): 300 mg via ORAL
  Filled 2017-09-13 (×6): qty 2

## 2017-09-13 MED ORDER — LOPERAMIDE HCL 2 MG PO CAPS
4.0000 mg | ORAL_CAPSULE | Freq: Once | ORAL | Status: AC
  Administered 2017-09-13: 4 mg via ORAL
  Filled 2017-09-13: qty 2

## 2017-09-13 MED ORDER — BACID PO TABS
2.0000 | ORAL_TABLET | Freq: Two times a day (BID) | ORAL | Status: DC
  Filled 2017-09-13: qty 2

## 2017-09-13 MED ORDER — ACETAMINOPHEN 500 MG PO TABS
1000.0000 mg | ORAL_TABLET | Freq: Once | ORAL | Status: AC
  Administered 2017-09-13: 1000 mg via ORAL
  Filled 2017-09-13: qty 2

## 2017-09-13 MED ORDER — RISAQUAD PO CAPS
2.0000 | ORAL_CAPSULE | Freq: Two times a day (BID) | ORAL | Status: DC
  Administered 2017-09-13 – 2017-09-14 (×4): 2 via ORAL
  Filled 2017-09-13 (×6): qty 2

## 2017-09-13 NOTE — BH Assessment (Signed)
Patient has been referred to inpatient treatment at the following facilities: [facility], [facility], [facility], [facility], [facility], [facility], [facility], [facility], [facility], [facility], [facility].  CSW in disposition will continue to follow up and seek placement for pt.  Verlon Setting, MSW, [place] Clinical social worker in [place] [facility], [place] Office [phone] and [phone] 09/13/2017 1:19 PM

## 2017-09-13 NOTE — Progress Notes (Signed)
[facility] ED CH07 [facility]) RN visit  Patient went to ED via EMS on 4/21 for suicidal ideations. She is also experiencing problems with housing and needs a place to live.  Currently [facility] LCSW is working with this patient to facilitate housing or possibly going to ALF or SNF.   Met with patient at bedside. She does not appear in distress although she does report difficulty in getting her pain managed. She is receiving Oxycodone 37m Q4hrs PRN and was dosed at 0509 and 1007.  She is due for a dressing change today by her [facility] case mFreight forwarder Discussed with BHassan RowanRN, advised her I would have JLongfordcase manager to call and review instructions and products needed to change the dressing. Patient is also being evaluated for IP psych treatment per notes. [facility] LCSW is going to update Katie [facility] LCSW once psych evaluation is completed.   [facility] hospital liaisons will continue to follow and anticpate needs for this patient.   Please call with any hospice related questions or concerns.   Thank you,  MFarrel Gordon RN, [facility] HospitalLiaison [phone] [facility] are on [place]

## 2017-09-13 NOTE — Progress Notes (Signed)
CSW receiebce call from [place] with [facility] and was informed that she has been working with pt recently. Katie informed this CSW that she was awre of pt beign evicted from apartment and needing a place to stay. Katie went on to inform this CSW that she has been speaking with pt over the past few weeks and pt had expressed that pt was intretsed in SNF placement considering that pt may be being evicted Katies expressed that she was suppose to met and work with pt today on getting FL2 and other information needed in order to see that pt's needs are met. CSW explained that pt must meet qualification  for SNF placement in order to be admitted into any facility. .CSW also notifed Katie that at this time per notes pt is being recommended for IP psych treatment to address [place] needs first.   CSW will follow up with Joellen [facility] once psych has reassess pt. CSW will assists as needed at this time with further needs.     Virgie Dad Jamiria Langill, MSW, [place] [facility] Clinical Social Worker [phone]

## 2017-09-13 NOTE — Consult Note (Signed)
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  Bleeds with dressing changes.  She states they are changing twice weekly.  We will change Mon/Wed/Fri while here due to differences in wound care supplies.  Wound type:cancerous lesion Pressure Injury POA: NA Measurement: Left breast Wound bed: yellow malodorous devitalized tumor Drainage (amount, consistency, odor) Heavy purulent discharge with foul necrotic odor Periwound:intact Dressing procedure/placement/frequency: Wound bleeds when vaseline gauze is removed.  Combat gauze and pressure used to stop bleeding at proximal margins.  Bleeding stopped.  Covered with Xeroform gauze, ABD pads, kerlix and Coban.  Secured with ace wrap.  Change Mon/Wed/Fri WOC team will follow.  Domenic Moras RN BSN [place] Pager [phone]

## 2017-09-13 NOTE — BH Assessment (Signed)
[place] Assessment Progress Note  Pt reassessed today. Pt continues to endorse SI, citing that they're "a little better today". Pt denies HI, AVH. Pt has been restarted on her Remeron. Clinician explained to pt that she is being referred for IP psych treatment due to her active SI and inability to contract for safety. Clinician explained that, due to her medical conditions, it will be very difficult to find a psych facility to accommodate her medical needs. Clinician also explained that, while pt continues to be at risk for suicide, no efforts can be made towards finding placement at a nursing home or SNF. Pt voiced understanding. Pt is still recommended for IP psych treatment, at this time.   Kenna Gilbert. Lovena Le, [place], [place], LPCA Counselor

## 2017-09-13 NOTE — ED Provider Notes (Signed)
12:00 AM  Patient is a 62 year old female on hospice for breast cancer who presented to the emergency department with suicidal thoughts.  TTS has recommended inpatient psychiatric treatment and I agree.  Patient has a known fungating breast mass.  It has a foul odor coming from it now but she refuses to allow any provider to look at it.  She is afebrile and hemodynamically stable.  She does have a leukocytosis which appears chronic for her.  We will start empiric antibiotics for 10 days as well as probiotics.  We will continue to attempt to look at this wound if patient will allow [place].  She has pain medication prescribed.   Remmie Bembenek, Delice Bison, DO 09/13/17 0010

## 2017-09-13 NOTE — ED Notes (Signed)
Called [facility].  [phone]. 

## 2017-09-13 NOTE — ED Notes (Signed)
Wound nurse at bedside.

## 2017-09-14 ENCOUNTER — Encounter (HOSPITAL_COMMUNITY): Payer: Self-pay | Admitting: Registered Nurse

## 2017-09-14 ENCOUNTER — Other Ambulatory Visit: Payer: Self-pay

## 2017-09-14 LAB — RAPID URINE DRUG SCREEN, HOSP PERFORMED
Amphetamines: NOT DETECTED
Barbiturates: NOT DETECTED
Benzodiazepines: NOT DETECTED
Cocaine: NOT DETECTED
OPIATES: POSITIVE — AB
Tetrahydrocannabinol: NOT DETECTED

## 2017-09-14 NOTE — ED Provider Notes (Signed)
Patient to be discharged home with Hospice. Hospice will be following up for placement.    Little, Wenda [place]land, MD 09/14/17 [phone]

## 2017-09-14 NOTE — ED Notes (Signed)
Called [facility] for transport.  

## 2017-09-14 NOTE — ED Notes (Signed)
Gave pt caffeine free coke cola, per Jacqlyn Larsen - RN.

## 2017-09-14 NOTE — ED Notes (Signed)
Pt changed into her personal clothing.

## 2017-09-14 NOTE — ED Notes (Signed)
Telepsych being performed. 

## 2017-09-14 NOTE — ED Notes (Signed)
Ordered pt's dinner 

## 2017-09-14 NOTE — Progress Notes (Signed)
Disposition CSW contacted [facility] ED Social Work and spoke to [place], Ann Oliver, to let them know that pt is to be reassessed this AM.  If pt is no longer expressing SI, they, and [facility] SW will be able to move forward with SNF placement.  She would otherwise need to remain in the ED.  It is very unlikely that pt would be accepted at any inpatient psych facilities given her medical condition and needs.  Areatha Keas. Judi Cong, MSW, [place] Disposition Clinical Social Work [phone] (cell) [phone] (office)

## 2017-09-14 NOTE — ED Notes (Signed)
Pt initially stating she did not think she was going home tonight. States she thought she would be d/c'd tomorrow. Advised pt RN was in room w/SW who advised pt she would be d/c'd tonight and Hospice will follow up w/her tomorrow and will continue to look for placement for her. Pt stating "I don't have my medicine at home". Then states she has her Neurontin, Tylenol, and other meds but "not my Oxy". Advised pt Hospice is assisting her w/this.

## 2017-09-14 NOTE — BH Assessment (Signed)
[place] Assessment Progress Note  Pt reassessed today. She reports feeling "pretty good". She says her SI is "a little better". She indicates that thinking about "them finding me a place" makes her feel better. Clinician reiterated to pt that she can't be found a place until she feels safe enough to not want to actively kill herself. Clinician asked pt if she feels that way. Pt says she feels "a lot better. I can handle myself a lot better".   Disposition pending consult with NP.   Kenna Gilbert. Lovena Le, [place], [place], LPCA Counselor

## 2017-09-14 NOTE — ED Notes (Signed)
Pt given snack as requested. 

## 2017-09-14 NOTE — ED Notes (Signed)
Pt declined shower x 2.

## 2017-09-14 NOTE — Progress Notes (Signed)
Hospice and [facility]) Hospital Liaison: RN visit @ [phone].   Spoke with patient at bedside. Sitter was also present. Patient did not appear in distress. Rates pain at 8-9/0-10. Last medicated at 0701 with Oxy Condone 10mg .  RN aware of pain. WOC consult for dressing changes made and dressing was changed on 09/13/2017.  Patient expresses desire to go to a nursing facility. Currently the plan is discharge discharge today if no longer experiencing SI. Per  [facility] notes ED SW will seek SNF placement. Joellen Jersey Powers CSW with [facility] made aware.   Epic WOC notes:  Reason for Consult:Fungating tumor to left breast.  Chronic.  Bleeds with dressing changes.  She states they are changing twice weekly.  We will change Mon/Wed/Fri while here due to differences in wound care supplies.  Wound type:cancerous lesion Pressure Injury POA: NA Measurement: Left breast Wound bed: yellow malodorous devitalized tumor Drainage (amount, consistency, odor) Heavy purulent discharge with foul necrotic odor Periwound:intact Dressing procedure/placement/frequency: Wound bleeds when vaseline gauze is removed.  Combat gauze and pressure used to stop bleeding at proximal margins.  Bleeding stopped.  Covered with Xeroform gauze, ABD pads, kerlix and Coban.  Secured with ace wrap.  Change Mon/Wed/Fri.  [place] team will follow.  Domenic Moras RN BSN CWON Pager [phone]  Please call with any hospice related questions or concerns.  Hospice will continue to follow and anticipate discharge needs.  If ambulance transport is needed at discharge please use [facility] for active [facility] patients.   Thank you,  Farrel Gordon, RN, [place] Hospital Liaison [place] are on [facility].

## 2017-09-14 NOTE — Consult Note (Signed)
  Tele Assessment   Ann Oliver, 62 y.o., female patient presented to [facility] with complaints of suicidal ideation,  Patient seen via telepsych by this provider; chart reviewed and consulted with Dr. Dwyane Dee on 09/14/17. Patient has end stage metastatic breast cancer with chronic pain.  She is currently in hospice care. Patient also in the process of being evicted from her home.   On evaluation Ann Oliver reports that she came to the hospital because she was feeling suicidal.  "  I was feeling like I did not want to live anymore."  Patient states that she has a lot going on in her life with her cancer and having some place to live.  Patient reports that she is feeling better and would feel a lot better if she knew she was able to get into assistant living place her somewhere.  "  I feel good enough to go to assisted living.  I am not suicidal right now cause they say they are looking for somewhere from me to leave.  Patient reports that she is eating without any difficulty.  Patient also states that she is able to sleep well when she is not in pain.  "  As long as my pain is controlled I sleep good."  At this time patient denies suicidal/self-harm/homicidal ideations, psychosis, and paranoia.  During evaluation Ann Oliver is alert/oriented x 4; calm/cooperative; mood is congruent with affect.  She does not appear to be responding to internal/external stimuli or delusional thoughts..  Patient denies suicidal/self-harm/homicidal ideation, psychosis, and paranoia.  Patient answered question appropriately.  Patient is seeking skilled nursing or assisted living facility to go to after discharge.  Recommendations: Patient psychiatrically cleared.  Social work to continue assisting patient with assistant living/skilled nursing placement.  Patient to continue following up with current psychiatric outpatient provider.  Patient to continue current psychotropic medications.  Disposition: No evidence of  imminent risk to self or others at present.   Patient does not meet criteria for psychiatric inpatient admission.   Spoke with [facility] about above recommendations/disposition and she will inform EDP.    Ann Newport, NP

## 2017-09-14 NOTE — ED Notes (Signed)
Pt's dinner tray arrived. 

## 2017-09-14 NOTE — Progress Notes (Addendum)
CSW received phone call from [place] with pt's Hospice Team. Valetta Fuller informed CSW that she spoke with Adela Lank from [facility] and [place] has a bed available for pt, but is needing FL-2.  CSW explained to Hospice Team that she can complete FL-2 and fax it to [facility], but pt cannot stay in the ED once cleared. Hospice Care Team expressed understanding that pt will be transitioned to [facility] or home once cleared.   CSW spoke with pt. Pt stated she would like to go to SNF at [facility]. Pt agreed to signing her check over if necessary. Pt understands and is agreeable to being transitioned home if she is unable to go to [facility] once cleared.   Update: Pt's PASSR is under review. Pt does not have PASSR number. CSW called to update [place] with Hospice. CSW explained that pt will be transitioned home with continued hospice and seek placement from home once PASSR number is in.  Plan: Pt transitioned home with hospice to seek placement from the community once cleared. Pt and pt's hospice team are agreeable.   6:15 PM CSW received phone call stating pt is cleared to transition home. CSW updated pt about plan to be transitioned home with Hospice and continue to seek placement from the community.   Wendelyn Breslow, [facility]  [phone]

## 2017-09-14 NOTE — Discharge Planning (Signed)
Discussed at [place] of Stay Meetings, dates discussed:    4/23: Reported that pt will d/c today if no SI; notes that EDSW will seek SNF placement

## 2017-09-23 ENCOUNTER — Telehealth: Payer: Self-pay

## 2017-09-23 NOTE — Telephone Encounter (Signed)
Received call yesterday from Hospice stating they have discharged Ann Oliver from their services due to her not following guidelines of contract between them. Sent msg to our Education officer, museum to make her aware.  Cyndia Bent RN

## 2017-09-24 ENCOUNTER — Encounter: Payer: Self-pay | Admitting: General Practice

## 2017-09-24 ENCOUNTER — Other Ambulatory Visit: Payer: Self-pay

## 2017-09-24 ENCOUNTER — Telehealth: Payer: Self-pay | Admitting: *Deleted

## 2017-09-24 ENCOUNTER — Encounter (HOSPITAL_COMMUNITY): Payer: Self-pay | Admitting: Emergency Medicine

## 2017-09-24 ENCOUNTER — Telehealth: Payer: Self-pay | Admitting: Hematology and Oncology

## 2017-09-24 ENCOUNTER — Emergency Department (HOSPITAL_COMMUNITY)
Admission: EM | Admit: 2017-09-24 | Discharge: 2017-09-26 | Disposition: A | Discharge status: Skilled Nursing Facility | Payer: Medicaid Other | Attending: Emergency Medicine | Admitting: Emergency Medicine

## 2017-09-24 DIAGNOSIS — Z79899 Other long term (current) drug therapy: Secondary | ICD-10-CM | POA: Diagnosis not present

## 2017-09-24 DIAGNOSIS — C50911 Malignant neoplasm of unspecified site of right female breast: Secondary | ICD-10-CM

## 2017-09-24 DIAGNOSIS — C7801 Secondary malignant neoplasm of right lung: Secondary | ICD-10-CM | POA: Insufficient documentation

## 2017-09-24 DIAGNOSIS — N644 Mastodynia: Secondary | ICD-10-CM | POA: Diagnosis present

## 2017-09-24 DIAGNOSIS — C50412 Malignant neoplasm of upper-outer quadrant of left female breast: Secondary | ICD-10-CM

## 2017-09-24 DIAGNOSIS — Z170 Estrogen receptor positive status [ER+]: Secondary | ICD-10-CM

## 2017-09-24 DIAGNOSIS — C7800 Secondary malignant neoplasm of unspecified lung: Secondary | ICD-10-CM

## 2017-09-24 DIAGNOSIS — D0592 Unspecified type of carcinoma in situ of left breast: Secondary | ICD-10-CM | POA: Diagnosis not present

## 2017-09-24 MED ORDER — OXYCODONE HCL 5 MG PO TABS
10.0000 mg | ORAL_TABLET | Freq: Once | ORAL | Status: AC
  Administered 2017-09-24: 10 mg via ORAL
  Filled 2017-09-24: qty 2

## 2017-09-24 NOTE — Telephone Encounter (Signed)
Spoke to patient regarding upcoming may appointments per 5/2 sch message.

## 2017-09-24 NOTE — Telephone Encounter (Signed)
Pt called to this RN to state she needed "an appointment next week because I will not have a place to live and then have to go a hotel and the home health nurse will not be able to come do my dressing change"  Of note pt was under [facility] services but was discharged due to non compliancy with plan of care.  Ann Oliver is currently at an [place] apartments [address] " and they will be changing the locks today so I am probably going to have to go to a motel "  Ann Oliver states she " called [facility] but they said I needed a doctor to admit me - I need assisted living " " do you know if they have an open bed " This RN informed Ann Oliver above is not something this RN could answer.  " I need to walk in and have an appointment there so my dressing can be changed "  Ann Oliver states a home health nurse came yesterday and changed her dressing.  This RN informed pt appointment will be obtained for MD visit next week - but if her wound needs care she would need to proceed to the ER for appropriate care and to let them know of her current living status and issues.  This note will be given to MD as well as to Support Staff for assistance in care of this patient.  Return call given per this discussion is [phone].

## 2017-09-24 NOTE — Telephone Encounter (Signed)
Voicemail received requesting return call [phone].  Message forwarded for further patient communication from collaborative nurse.

## 2017-09-24 NOTE — ED Triage Notes (Signed)
Pt brought in by EMS from a motel where she just checked out with c/o increased pain to left breast.  Pt has a chronic open wound to left breast from cancer.  Was recently on hospice care for a short period.  Pt reports pain to affected area has increased significantly and she is not on any pain medication---- pt is also requesting a "social work consult".

## 2017-09-24 NOTE — ED Notes (Signed)
Bed: SU86 Expected date:  Expected time:  Means of arrival:  Comments: EMS 62 yo female from home/left breast and arm pain-recent Hospice care-now off it

## 2017-09-25 MED ORDER — ACETAMINOPHEN 325 MG PO TABS
650.0000 mg | ORAL_TABLET | Freq: Four times a day (QID) | ORAL | Status: DC | PRN
  Administered 2017-09-25 – 2017-09-26 (×4): 650 mg via ORAL
  Filled 2017-09-25 (×4): qty 2

## 2017-09-25 MED ORDER — OXYCODONE HCL 5 MG PO TABS
10.0000 mg | ORAL_TABLET | ORAL | Status: DC | PRN
  Administered 2017-09-25 – 2017-09-26 (×5): 10 mg via ORAL
  Filled 2017-09-25 (×5): qty 2

## 2017-09-25 MED ORDER — GABAPENTIN 300 MG PO CAPS
600.0000 mg | ORAL_CAPSULE | Freq: Every day | ORAL | Status: DC
  Administered 2017-09-25: 600 mg via ORAL
  Filled 2017-09-25: qty 2

## 2017-09-25 MED ORDER — GABAPENTIN 300 MG PO CAPS
600.0000 mg | ORAL_CAPSULE | Freq: Three times a day (TID) | ORAL | Status: DC
  Administered 2017-09-25 – 2017-09-26 (×4): 600 mg via ORAL
  Filled 2017-09-25 (×4): qty 2

## 2017-09-25 MED ORDER — ONDANSETRON 4 MG PO TBDP: 4.0000 mg | ORAL_TABLET | Freq: Once | ORAL | Status: DC

## 2017-09-25 MED ORDER — MIRTAZAPINE 30 MG PO TABS
15.0000 mg | ORAL_TABLET | Freq: Every day | ORAL | Status: DC
  Administered 2017-09-25: 15 mg via ORAL
  Filled 2017-09-25: qty 1

## 2017-09-25 MED ORDER — ACETAMINOPHEN 325 MG PO TABS
650.0000 mg | ORAL_TABLET | Freq: Once | ORAL | Status: AC
  Administered 2017-09-25: 650 mg via ORAL
  Filled 2017-09-25: qty 2

## 2017-09-25 NOTE — ED Provider Notes (Signed)
[facility] DEPT Provider Note   CSN: [id number] Arrival date & time: 09/24/17  2216     History   Chief Complaint Chief Complaint  Patient presents with  . Breast Pain    HPI Ann Oliver is a 62 y.o. female.  If complaint is "I cannot pay for my hotel anymore".  HPI: Unfortunate 62 year old female with history of extensive large fungating breast mass that is widely metastatic.  Has had a multitude of social issues and difficulties including difficulty with finding placement.  She is been in hospice.  She has been hesitant to be compliant with outpatient follow-up that has been extensively provided for her.  Most recently as of today contacted her nurse stating that she needed to have her dressing changes and that she was not going to be at her hotel anymore because she was being kicked out.  Per the notes in the chart the nurse advised her to "go to the emergency room if she needed help".  Past Medical History:  Diagnosis Date  . Breast cancer (HCC)    METS TO LUNGS  . Sinusitis   . Tylenol overdose 05/2017    Patient Active Problem List   Diagnosis Date Noted  . Fever 08/24/2017  . MDD (major depressive disorder), recurrent episode, moderate ([place]) 06/03/2017  . Suicidal ideation 06/02/2017  . Tylenol overdose 06/01/2017  . Breast mass   . Acute blood loss anemia   . Hemorrhage from wound   . Ascites   . Evaluation by psychiatric service required 04/22/2017  . AKI (acute kidney injury) ([place]) 03/26/2017  . Leg swelling 03/26/2017  . Diarrhea 03/26/2017  . Leukocytosis 03/26/2017  . Breast wound 03/18/2017  . Blood loss anemia 01/18/2017  . Port catheter in place 12/02/2016  . Necrotizing soft tissue infection 11/21/2016  . Wound infection 10/28/2016  . Cellulitis of left breast 10/28/2016  . Encounter for palliative care   . Goals of care, counseling/discussion   . 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  Malnutrition of moderate degree 09/25/2016  . Breast cancer of upper-outer quadrant of left female breast ([place]) 10/22/2015  . THYROID NODULE, RIGHT 05/31/2007  . Depressed 05/31/2007  . GERD 05/31/2007  . HOT FLASHES 05/31/2007  . HEADACHE 05/31/2007    Past Surgical History:  Procedure Laterality Date  . BIOPSY BREAST    . IR FLUORO GUIDE CV LINE RIGHT  11/06/2016  . IR US GUIDE VASC ACCESS RIGHT  11/06/2016     OB History   None      Home Medications    Prior to Admission medications   Medication Sig Start Date End Date Taking? Authorizing Provider  loperamide (IMODIUM) 2 MG capsule Take 1 capsule (2 mg total) by mouth as needed for diarrhea or loose stools. 05/28/17  Yes Nicholas Lose, MD  mirtazapine (REMERON) 15 MG tablet Take 1 tablet (15 mg total) by mouth at bedtime. 08/09/17  Yes Nicholas Lose, MD  Multiple Vitamin (MULTIVITAMIN WITH MINERALS) TABS tablet Take 1 tablet by mouth daily. 05/07/17  Yes Bonnielee Haff, MD  ondansetron (ZOFRAN) 8 MG tablet Take 1 tablet (8 mg total) by mouth every 8 (eight) hours as needed for nausea or vomiting. 05/07/17  Yes Bonnielee Haff, MD  Oxycodone HCl 10 MG TABS Take 1 tablet (10 mg total) by mouth every 4 (four) hours as needed. Patient taking differently: Take 10 mg by mouth every 4 (four) hours as needed (pain).  08/14/17  Yes  Caccavale, Sophia, PA-C  gabapentin (NEURONTIN) 600 MG tablet Take 1 tablet (600 mg total) by mouth 3 (three) times daily for 15 days. 08/26/17 09/12/17  Bonnielee Haff, MD  polyethylene glycol [facility] / Floria Raveling) packet Take 17 g by mouth daily as needed for moderate constipation. Patient not taking: Reported on 09/12/2017 08/26/17   Bonnielee Haff, MD  senna (SENOKOT) 8.6 MG TABS tablet Take 1 tablet (8.6 mg total) by mouth at bedtime. Patient not taking: Reported on 09/12/2017 08/26/17   Bonnielee Haff, MD    Family History Family History  Family history unknown: Yes    Social History Social History    Tobacco Use  . Smoking status: Never Smoker  . Smokeless tobacco: Never Used  Substance Use Topics  . Alcohol use: No  . Drug use: No     Allergies   Aspirin   Review of Systems Review of Systems  Constitutional: Negative for appetite change, chills, diaphoresis, fatigue and fever.  HENT: Negative for mouth sores, sore throat and trouble swallowing.   Eyes: Negative for visual disturbance.  Respiratory: Negative for cough, chest tightness, shortness of breath and wheezing.   Cardiovascular: Negative for chest pain.  Gastrointestinal: Negative for abdominal distention, abdominal pain, diarrhea, nausea and vomiting.  Endocrine: Negative for polydipsia, polyphagia and polyuria.  Genitourinary: Negative for dysuria, frequency and hematuria.  Musculoskeletal: Negative for gait problem.  Skin: Negative for color change, pallor and rash.       Breast pain  Neurological: Negative for dizziness, syncope, light-headedness and headaches.  Hematological: Does not bruise/bleed easily.  Psychiatric/Behavioral: Negative for behavioral problems and confusion.     Physical Exam Updated Vital Signs BP (!) 146/81   Pulse 80   Temp 98.4 F (36.9 C) (Oral)   Resp 18   SpO2 100%   Physical Exam  Constitutional: She is oriented to person, place, and time. She appears well-developed and well-nourished. No distress.  HENT:  Head: Normocephalic.  Eyes: Pupils are equal, round, and reactive to light. Conjunctivae are normal. No scleral icterus.  Neck: Normal range of motion. Neck supple. No thyromegaly present.  Cardiovascular: Normal rate and regular rhythm. Exam reveals no gallop and no friction rub.  No murmur heard. Pulmonary/Chest: Effort normal and breath sounds normal. No respiratory distress. She has no wheezes. She has no rales.  Large breast mass.  Abdominal: Soft. Bowel sounds are normal. She exhibits no distension. There is no tenderness. There is no rebound.   Musculoskeletal: Normal range of motion.  Neurological: She is alert and oriented to person, place, and time.  Skin: Skin is warm and dry. No rash noted.  Psychiatric: She has a normal mood and affect. Her behavior is normal.     ED Treatments / Results  Labs (all labs ordered are listed, but only abnormal results are displayed) Labs Reviewed - No data to display  EKG None  Radiology No results found.  Procedures Procedures (including critical care time)  Medications Ordered in ED Medications  gabapentin (NEURONTIN) capsule 600 mg (600 mg Oral Given 09/25/17 0057)  oxyCODONE (Oxy IR/ROXICODONE) immediate release tablet 10 mg (has no administration in time range)  ondansetron (ZOFRAN-ODT) disintegrating tablet 4 mg (has no administration in time range)  mirtazapine (REMERON) tablet 15 mg (has no administration in time range)  oxyCODONE (Oxy IR/ROXICODONE) immediate release tablet 10 mg (10 mg Oral Given 09/24/17 2355)  acetaminophen (TYLENOL) tablet 650 mg (650 mg Oral Given 09/25/17 0215)     Initial Impression / Assessment  and Plan / ED Course  I have reviewed the triage vital signs and the nursing notes.  Pertinent labs & imaging results that were available during my care of the patient were reviewed by me and considered in my medical decision making (see chart for details).    Plan a.m. social work evaluation.  Continue current meds.  Final Clinical Impressions(s) / ED Diagnoses   Final diagnoses:  Breast pain, left    ED Discharge Orders    None       Tanna Furry, MD 09/25/17 [phone]

## 2017-09-25 NOTE — Progress Notes (Addendum)
CSW contacted by [facility] staff member Casper Harrison and informed that patient has been accepted to SNF on 09/26/2017 by admissions staff Rosario Jacks.   CSW will complete FL2 and send to [facility].  CSW will continue to follow and assist with discharge planning.  3:52PM CSW updated patient about SNF bed at [facility].   Abundio Miu, [place] Social Worker [facility] Cell#: [phone]

## 2017-09-25 NOTE — ED Notes (Signed)
Pt requesting that MD order her gabapentin to be administered like she was taking it at home. Pt states she was taking the gabapentin TID as prescribed at home. MD made aware.

## 2017-09-25 NOTE — ED Provider Notes (Signed)
Blood pressure (!) 141/75, pulse 89, temperature 98.4 F (36.9 C), temperature source Oral, resp. rate 18, SpO2 100 %.  Assuming care from Dr. Jeneen Rinks.  In short, Ann Oliver is a 62 y.o. female with a chief complaint of Breast Pain .  Refer to the original H&P for additional details.  The current plan of care is to f/u after SW evaluation.  09:15 AM Patient evaluated by SW. Will attempt placement from the ED.   04:00 PM Patient has been accepted to facility but cannot go until tomorrow. Will complete FL-2.   Nanda Quinton, MD    Margette Fast, MD 09/25/17 [phone]

## 2017-09-25 NOTE — ED Notes (Signed)
Pt calling out for nurse multiple times for various complaints. Pt informed that this RN is checking to see if PRN pain medication can be administered at this time. Pt informed if pain medication can be administered at this time, RN will bring it in. Pt informed that there are no lunch menus to order from in the ED and a generic tray with ordered diet would be brought up at lunch. Pt verbalizes understanding.

## 2017-09-25 NOTE — Clinical Social Work Note (Signed)
Clinical Social Work Assessment  Patient Details  Name: Ann Oliver MRN: [id number] Date of Birth: 1955-11-22  Date of referral:  09/25/17               Reason for consult:  Facility Placement, Housing Concerns/Homelessness                Permission sought to share information with:  Facility Art therapist granted to share information::  Yes, Verbal Permission Granted  Name::        Agency::     Relationship::     Contact Information:     Housing/Transportation Living arrangements for the past 2 months:  Apartment, No permanent address(Patient reported that she was recently staying in an apartment with a relative until she was evicted. Patient reported she then moved to motel and has been paying day by day) Source of Information:  Patient Patient Interpreter Needed:  None Criminal Activity/Legal Involvement Pertinent to Current Situation/Hospitalization:  No - Comment as needed Significant Relationships:    Lives with:  Self Do you feel safe going back to the place where you live?  No(PAtient reported that she is unable to return to motel because she is in too much pain and has no one to do dressing changes.) Need for family participation in patient care:  No (Coment)  Care giving concerns:  Patient from motel. Patient reported that she was recently evicted from an apartment that she was staying in with a relative. Patient reported that she is currently having issues with pain management and needs assistance with dressing changes. Per chart review patient was recently seen by [facility] ED CSW and patient was supposed to admit to [facility] from home.   Social Worker assessment / plan:  CSW spoke with patient at bedside regarding patient's request to speak with Education officer, museum and current housing situation. Patient reported that she was recently evicted from apartment and reported that she has been staying at a motel paying day by day. Patient reported that she is  unable to return to motel because her pain is not managed and that she needs assistance with dressing changes. Patient reported that she was followed by [facility] but that they were not managing her pain. CSW inquired about patient's recent plan to admit to [facility]. Patient reported that SNF is waiting for a form from hospital. CSW asked patient if it was a FL2, patient replied yes. CSW agreed to follow up with [facility] SNF to see if they still have a bed available for patient. CSW asked patient if patient is agreeable to dc to SNF, patient reported yes because she needs the help.  CSW contacted [facility] and spoke with staff member Casper Harrison, staff agreed to contact admissions to see if patient has a bed available at [facility].  CSW awaiting return call from [facility] staff.  CSW will continue to follow and assist with discharge planning.  Employment status:  Disabled (Comment on whether or not currently receiving Disability) Insurance information:  Medicaid [place] PT Recommendations:  Not assessed at this time Information / Referral to community resources:  [place]  Patient/Family's Response to care:  Patient appreciative of CSW assistance with discharge planning.   Patient/Family's Understanding of and Emotional Response to Diagnosis, Current Treatment, and Prognosis:  Patient presented calm and reported that she plans to dc to SNF if possible. Patient hopeful that SNF will have a bed available so she can get her pain  managed and dressing changes.   Emotional Assessment Appearance:  Appears older than stated age Attitude/Demeanor/Rapport:  Other(Open) Affect (typically observed):  Calm Orientation:  Oriented to Self, Oriented to Place, Oriented to  Time, Oriented to Situation Alcohol / Substance use:  Not Applicable Psych involvement (Current and /or in the community):  No (Comment)  Discharge Needs  Concerns to be addressed:  Care Coordination Readmission  within the last 30 days:  Yes Current discharge risk:  Homeless, Other(Needs assistance with dressing changes) Barriers to Discharge:  No SNF bed   [name], LCSW 09/25/2017, 12:08 PM

## 2017-09-25 NOTE — ED Notes (Signed)
Pt called out requesting a phone. Pt provided with phone in room and instructed on use of phone.

## 2017-09-25 NOTE — ED Notes (Signed)
Pt called out requesting tylenol for pain. Pt reporting that she is still in pain. Pt requesting a dressing change to breast wound once pain is managed. Pt requesting lactose free milk with breakfast tray. Sec informed of need for diet change. Order for tylenol placed by previous RN. Will administer medication once verified.

## 2017-09-25 NOTE — NC FL2 (Signed)
[place] LEVEL OF CARE SCREENING TOOL     IDENTIFICATION  Patient Name: Ann Oliver Birthdate: Dec 15, 1955 Sex: female Admission Date (Current Location): 09/24/2017  [place] and [place] Number:  Ann Oliver [id number] [place] and Address:  [facility],  [place] [address] [place]      Provider Number: [id number]  Attending Physician Name and Address:  Default, Provider, MD  Relative Name and Phone Number:       Current Level of Care: Hospital Recommended Level of Care: [place] Prior Approval Number:    Date Approved/Denied:   PASRR Number: [id number]  Discharge Plan: SNF    Current Diagnoses: Patient Active Problem List   Diagnosis Date Noted  . Fever 08/24/2017  . MDD (major depressive disorder), recurrent episode, moderate ([place]) 06/03/2017  . Suicidal ideation 06/02/2017  . Tylenol overdose 06/01/2017  . Breast mass   . Acute blood loss anemia   . Hemorrhage from wound   . Ascites   . Evaluation by psychiatric service required 04/22/2017  . AKI (acute kidney injury) ([place]) 03/26/2017  . Leg swelling 03/26/2017  . Diarrhea 03/26/2017  . Leukocytosis 03/26/2017  . Breast wound 03/18/2017  . Blood loss anemia 01/18/2017  . Port catheter in place 12/02/2016  . Necrotizing soft tissue infection 11/21/2016  . Wound infection 10/28/2016  . Cellulitis of left breast 10/28/2016  . Encounter for palliative care   . Goals of care, counseling/discussion   . Breast cancer metastasized to lung, right ([place]) 09/25/2016  . Malnutrition of moderate degree 09/25/2016  . Breast cancer of upper-outer quadrant of left female breast ([place]) 10/22/2015  . THYROID NODULE, RIGHT 05/31/2007  . Depressed 05/31/2007  . GERD 05/31/2007  . HOT FLASHES 05/31/2007  . HEADACHE 05/31/2007    Orientation RESPIRATION BLADDER Height & Weight     Self, Time, Situation, Place  Normal Continent Weight:   Height:     BEHAVIORAL  SYMPTOMS/MOOD NEUROLOGICAL BOWEL NUTRITION STATUS      Continent Diet(see after visit summary)  AMBULATORY STATUS COMMUNICATION OF NEEDS Skin   Supervision Verbally Other (Comment)(cancerous lesion; Left breast; Dressing procedure/placement/frequency: Wound bleeds when vaseline gauze is removed.  Combat gauze and pressure used to stop bleeding at proximal margins.  Bleeding stopped.  Covered with Xeroform gauze, ABD pads, kerlix an) Coban. Secured with ace wrap. Change Mon/Wed/Fri                        Personal Care Assistance Level of Assistance  Bathing, Feeding, Dressing Bathing Assistance: Maximum assistance Feeding assistance: Independent Dressing Assistance: Maximum assistance     Functional Limitations Info  Sight, Hearing, Speech Sight Info: Adequate Hearing Info: Adequate Speech Info: Adequate    SPECIAL CARE FACTORS FREQUENCY                       Contractures      Additional Factors Info  Code Status, Allergies Code Status Info: Full Code Allergies Info: Aspirin           Current Medications (09/25/2017):  This is the current hospital active medication list Current Facility-Administered Medications  Medication Dose Route Frequency Provider Last Rate Last Dose  . acetaminophen (TYLENOL) tablet 650 mg  650 mg Oral Q6H PRN Tanna Furry, MD   650 mg at 09/25/17 1326  . gabapentin (NEURONTIN) capsule 600 mg  600 mg Oral TID Margette Fast, MD   600 mg at 09/25/17 0815  .  mirtazapine (REMERON) tablet 15 mg  15 mg Oral QHS Tanna Furry, MD      . ondansetron (ZOFRAN-ODT) disintegrating tablet 4 mg  4 mg Oral Once Tanna Furry, MD      . oxyCODONE (Oxy IR/ROXICODONE) immediate release tablet 10 mg  10 mg Oral Q4H PRN Tanna Furry, MD   10 mg at 09/25/17 1041   Current Outpatient Medications  Medication Sig Dispense Refill  . loperamide (IMODIUM) 2 MG capsule Take 1 capsule (2 mg total) by mouth as needed for diarrhea or loose stools. 30 capsule 0  .  mirtazapine (REMERON) 15 MG tablet Take 1 tablet (15 mg total) by mouth at bedtime. 30 tablet 0  . Multiple Vitamin (MULTIVITAMIN WITH MINERALS) TABS tablet Take 1 tablet by mouth daily. 30 tablet 0  . ondansetron (ZOFRAN) 8 MG tablet Take 1 tablet (8 mg total) by mouth every 8 (eight) hours as needed for nausea or vomiting. 20 tablet 0  . Oxycodone HCl 10 MG TABS Take 1 tablet (10 mg total) by mouth every 4 (four) hours as needed. (Patient taking differently: Take 10 mg by mouth every 4 (four) hours as needed (pain). ) 6 tablet 0  . gabapentin (NEURONTIN) 600 MG tablet Take 1 tablet (600 mg total) by mouth 3 (three) times daily for 15 days. 45 tablet 0  . polyethylene glycol (MIRALAX / GLYCOLAX) packet Take 17 g by mouth daily as needed for moderate constipation. (Patient not taking: Reported on 09/12/2017) 30 each 0  . senna (SENOKOT) 8.6 MG TABS tablet Take 1 tablet (8.6 mg total) by mouth at bedtime. (Patient not taking: Reported on 09/12/2017) 30 each 2   Facility-Administered Medications Ordered in Other Encounters  Medication Dose Route Frequency Provider Last Rate Last Dose  . sodium chloride flush (NS) 0.9 % injection 10 mL  10 mL Intracatheter PRN Nicholas Lose, MD   10 mL at 12/25/16 1702     Discharge Medications: Please see discharge summary for a list of discharge medications.  Relevant Imaging Results:  Relevant Lab Results:   Additional Information [id number]  [name], LCSW

## 2017-09-26 MED ORDER — LOPERAMIDE HCL 2 MG PO CAPS: 2.0000 mg | ORAL_CAPSULE | ORAL | 0 refills | Status: AC | PRN

## 2017-09-26 MED ORDER — MIRTAZAPINE 15 MG PO TABS: 15.0000 mg | ORAL_TABLET | Freq: Every day | ORAL | 0 refills | Status: AC

## 2017-09-26 MED ORDER — OXYCODONE HCL 5 MG PO TABS
10.0000 mg | ORAL_TABLET | ORAL | Status: DC | PRN
  Administered 2017-09-26: 10 mg via ORAL
  Filled 2017-09-26: qty 2

## 2017-09-26 MED ORDER — OXYCODONE-ACETAMINOPHEN 5-325 MG PO TABS: 1.0000 | ORAL_TABLET | Freq: Three times a day (TID) | ORAL | 0 refills | Status: AC | PRN

## 2017-09-26 MED ORDER — ONDANSETRON HCL 8 MG PO TABS: 8.0000 mg | ORAL_TABLET | Freq: Three times a day (TID) | ORAL | 0 refills | Status: AC | PRN

## 2017-09-26 NOTE — Progress Notes (Signed)
CSW spoke with [facility] staff member Bethena Roys who confirmed patient's bed offer. Staff reported that patient can arrive at any time.  CSW updated patient, patient requested lunch.  CSW updated patient's RN about patient's request for lunch.  CSW faxed patient's after visit summary to [facility].   Patient's RN can call report to [phone], packet provided to patient's RN. Patient's RN will arrange PTAR.   CSW signing off, no other needs identified at this time.  Abundio Miu, [place] Social Worker [facility] Cell#: [phone]

## 2017-09-26 NOTE — Progress Notes (Signed)
Pt's dressing to breast changed with MD at bedside. Pt discharging to [facility] via [facility]. Pt's home meds, and belongings sent with pt to [facility] along with new prescriptions. RN called report multiple times to [place], no answer. Secretary told RN that someone would call back. RN notified Josie Dixon that pt would be going to facility in 30 minutes.

## 2017-09-26 NOTE — Discharge Instructions (Signed)
You were seen in the emergency department today and discharged to a nursing facility.  Continue the dressing changes as described in the FL-2 paperwork.  I am providing refills of medications to take as directed.  Establish care with a primary care physician for further treatment.

## 2017-09-27 NOTE — Telephone Encounter (Signed)
This RN contacted [employer] and verified pt was admitted and is in the care as of 09/26/2017.  Dr [name] made aware of above.

## 2017-09-29 ENCOUNTER — Inpatient Hospital Stay: Payer: Medicaid Other | Attending: Hematology and Oncology | Admitting: Hematology and Oncology

## 2017-09-29 ENCOUNTER — Inpatient Hospital Stay: Admitting: Hematology and Oncology

## 2017-09-29 VITALS — BP 114/68 | HR 103 | Temp 97.7°F | Resp 18 | Ht 61.0 in | Wt 99.0 lb

## 2017-09-29 DIAGNOSIS — C7801 Secondary malignant neoplasm of right lung: Secondary | ICD-10-CM | POA: Diagnosis not present

## 2017-09-29 DIAGNOSIS — Z170 Estrogen receptor positive status [ER+]: Secondary | ICD-10-CM | POA: Diagnosis not present

## 2017-09-29 DIAGNOSIS — C50911 Malignant neoplasm of unspecified site of right female breast: Secondary | ICD-10-CM

## 2017-09-29 DIAGNOSIS — R0789 Other chest pain: Secondary | ICD-10-CM | POA: Diagnosis not present

## 2017-09-29 DIAGNOSIS — R52 Pain, unspecified: Secondary | ICD-10-CM

## 2017-09-29 DIAGNOSIS — C7800 Secondary malignant neoplasm of unspecified lung: Secondary | ICD-10-CM

## 2017-09-29 DIAGNOSIS — C50412 Malignant neoplasm of upper-outer quadrant of left female breast: Secondary | ICD-10-CM

## 2017-09-29 MED ORDER — FENTANYL 25 MCG/HR TD PT72
25.0000 ug | MEDICATED_PATCH | TRANSDERMAL | 0 refills | Status: DC
Start: 2017-09-29 — End: 2017-09-29

## 2017-09-29 MED ORDER — FENTANYL 25 MCG/HR TD PT72: 25.0000 ug | MEDICATED_PATCH | TRANSDERMAL | 0 refills | Status: AC

## 2017-09-29 NOTE — Assessment & Plan Note (Signed)
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  I discussed with her about adding long-acting pain medication.  I recommended fentanyl 25 mcg every 72 hours.  I sent a prescription today.  Wound infection issues: Patient is currently in the nursing home for helping take care of her.  This is been a blessing for the patient to be in a place where she can find food shelter and wound care.  We are happy to see the patient on an as-needed basis.

## 2017-09-29 NOTE — Progress Notes (Signed)
Patient Care Team: Center, [facility] as PCP - General Nicholas Lose, MD as Consulting Physician (Hematology and Oncology) Delice Bison, Charlestine Massed, NP as Nurse Practitioner (Hematology and Oncology)  DIAGNOSIS:  Encounter Diagnoses  Name Primary?  . Malignant neoplasm of upper-outer quadrant of left female breast, unspecified estrogen receptor status ([place]) Yes  . Breast cancer metastasized to lung, right ([place])     SUMMARY OF ONCOLOGIC HISTORY:   Breast cancer of upper-outer quadrant of left female breast ([place])   09/26/2015 Mammogram    Left breast 2:00 position suspicious mass 3.1 x 1.8 x 2.8 cm, indeterminate group of calcifications UIQ left breast needs stereotactic biopsy      10/22/2015 Initial Diagnosis    Left breast biopsy UOQ: Invasive adenocarcinoma, grade 2-3, EF 20%, PR 0%, Ki-67 30%, left breast UIQ: Fibroadenoma with extensive calcifications       Miscellaneous    Patient decided not to get any interventions and was lost to follow-up      05/06/2016 Imaging    CT chest: Heterogeneous necrotic mass left chest wall and UOQ 6.2 cm extending into axilla, ext into pectoralis major and to skin multiple enlarged left axillary LN, multiple more than 15 lung nodules, 1.5 cm lesion right lobe of the liver      09/25/2016 - 09/27/2016 Hospital Admission    Breast abscess (fungating tumor) 12.5 cm with axillary LN mets, mets to lungs      11/18/2016 -  Chemotherapy    Taxol every 3 weeks palliative chemotherapy        CHIEF COMPLIANT: Intractable left chest wall pain  INTERVAL HISTORY: Ann Oliver is a 62 year old with above-mentioned history metastatic breast cancer with lung metastases who was on hospice until recently.  She was a very difficult patient to manage because she would have difficulty in following instructions and she has no support systems to help take care of her.  The hospice team was frustrated and they eventually fired her.  She has  been in the emergency room multiple times and she is now been placed in a skilled nursing facility.  She tells me that she has been able to eat better and she likes the place.  The pain unfortunately is still under poor control.  She tells me the gabapentin is only thing that works well.  Narcotic pain medications do not seem to do a whole lot for her.  She has been getting Percocets every 6 hours.  She has not been getting anything for long-term pain relief.  REVIEW OF SYSTEMS:   Constitutional: Denies fevers, chills or abnormal weight loss Eyes: Denies blurriness of vision Ears, nose, mouth, throat, and face: Denies mucositis or sore throat Respiratory: Denies cough, dyspnea or wheezes Cardiovascular: Denies palpitation, chest discomfort Gastrointestinal:  Denies nausea, heartburn or change in bowel habits Skin: Denies abnormal skin rashes Lymphatics: Denies new lymphadenopathy or easy bruising Neurological:Denies numbness, tingling or new weaknesses Behavioral/Psych: Mood is stable, no new changes  Extremities: No lower extremity edema Breast: Foul-smelling and necrotic left chest wall mass causing intractable pain All other systems were reviewed with the patient and are negative.  I have reviewed the past medical history, past surgical history, social history and family history with the patient and they are unchanged from previous note.  ALLERGIES:  is allergic to aspirin.  MEDICATIONS:  Current Outpatient Medications  Medication Sig Dispense Refill  . fentaNYL (DURAGESIC - DOSED MCG/HR) 25 MCG/HR patch Place 1 patch (25 mcg total) onto the  skin every 3 (three) days. 10 patch 0  . gabapentin (NEURONTIN) 600 MG tablet Take 1 tablet (600 mg total) by mouth 3 (three) times daily for 15 days. 45 tablet 0  . loperamide (IMODIUM) 2 MG capsule Take 1 capsule (2 mg total) by mouth as needed for diarrhea or loose stools. 30 capsule 0  . mirtazapine (REMERON) 15 MG tablet Take 1 tablet (15 mg  total) by mouth at bedtime. 30 tablet 0  . ondansetron (ZOFRAN) 8 MG tablet Take 1 tablet (8 mg total) by mouth every 8 (eight) hours as needed for nausea or vomiting. 20 tablet 0  . oxyCODONE-acetaminophen (PERCOCET/ROXICET) 5-325 MG tablet Take 1 tablet by mouth every 8 (eight) hours as needed for severe pain. 15 tablet 0  . polyethylene glycol (MIRALAX / GLYCOLAX) packet Take 17 g by mouth daily as needed for moderate constipation. (Patient not taking: Reported on 09/12/2017) 30 each 0  . senna (SENOKOT) 8.6 MG TABS tablet Take 1 tablet (8.6 mg total) by mouth at bedtime. (Patient not taking: Reported on 09/12/2017) 30 each 2   No current facility-administered medications for this visit.    Facility-Administered Medications Ordered in Other Visits  Medication Dose Route Frequency Provider Last Rate Last Dose  . sodium chloride flush (NS) 0.9 % injection 10 mL  10 mL Intracatheter PRN Nicholas Lose, MD   10 mL at 12/25/16 1702    PHYSICAL EXAMINATION: ECOG PERFORMANCE STATUS: 2 - Symptomatic, <50% confined to bed  Vitals:   09/29/17 1532  BP: 114/68  Pulse: (!) 103  Resp: 18  Temp: 97.7 F (36.5 C)  SpO2: 100%   Filed Weights   09/29/17 1532  Weight: 99 lb (44.9 kg)    GENERAL:alert, no distress and comfortable SKIN: skin color, texture, turgor are normal, no rashes or significant lesions EYES: normal, Conjunctiva are pink and non-injected, sclera clear OROPHARYNX:no exudate, no erythema and lips, buccal mucosa, and tongue normal  NECK: supple, thyroid normal size, non-tender, without nodularity LYMPH:  no palpable lymphadenopathy in the cervical, axillary or inguinal LUNGS: clear to auscultation and percussion with normal breathing effort HEART: regular rate & rhythm and no murmurs and no lower extremity edema ABDOMEN:abdomen soft, non-tender and normal bowel sounds MUSCULOSKELETAL:no cyanosis of digits and no clubbing  NEURO: alert & oriented x 3 with fluent speech, no focal  motor/sensory deficits EXTREMITIES: No lower extremity edema BREAST: Extremely foul-smelling left chest wall mass.  However it has not been bleeding as it was before.  (exam performed in the presence of a chaperone)  LABORATORY DATA:  I have reviewed the data as listed CMP Latest Ref Rng & Units 09/12/2017 08/24/2017 08/23/2017  Glucose 65 - 99 mg/dL 87 110(H) 104(H)  BUN 6 - 20 mg/dL 15 15 22(H)  Creatinine 0.44 - 1.00 mg/dL 0.77 0.77 0.69  Sodium 135 - 145 mmol/L 133(L) 139 139  Potassium 3.5 - 5.1 mmol/L 3.8 4.0 4.5  Chloride 101 - 111 mmol/L 101 105 106  CO2 22 - 32 mmol/L 19(L) 24 24  Calcium 8.9 - 10.3 mg/dL 8.7(L) 9.0 9.3  Total Protein 6.5 - 8.1 g/dL 7.0 8.2(H) 7.6  Total Bilirubin 0.3 - 1.2 mg/dL 0.3 0.4 0.2(L)  Alkaline Phos 38 - 126 U/L 232(H) 402(H) 356(H)  AST 15 - 41 U/L _0 ALT 14 - 54 U/L 19 34 37    Lab Results  Component Value Date   WBC 17.9 (H) 09/12/2017   HGB 8.0 (L) 09/12/2017  HCT 27.7 (L) 09/12/2017   MCV 76.3 (L) 09/12/2017   PLT 753 (H) 09/12/2017   NEUTROABS 15.2 (H) 08/24/2017    ASSESSMENT & PLAN:  Breast cancer of upper-outer quadrant of left female breast ([place]) 09/26/2015 Left breast 2:00 position suspicious mass 3.1 x 1.8 x 2.8 cm, indeterminate group of calcifications UIQ left breast needs stereotactic biopsy Left breast biopsy UOQ 10/22/2015: Invasive adenocarcinoma, grade 2-3, ER20%, PR 0%, Ki-67 30%, left breast UIQ: Fibroadenoma with extensive calcifications Clinical stage in May 2017: T2 N0 stage II a Clinical stage 05/08/2016: Stage IV Patient received 5 cycles of Taxol and chemo was discontinued because of progressive fatigue, wound care issues, homelessness   Intractable pain: Currently on Percocets.  I discussed with her about adding long-acting pain medication.  I recommended fentanyl 25 mcg every 72 hours.  I sent a prescription today.  Wound infection issues: Patient is currently in the nursing home for helping take care of  her.  This is been a blessing for the patient to be in a place where she can find food shelter and wound care.  We are happy to see the patient on an as-needed basis.     No orders of the defined types were placed in this encounter.  The patient has a good understanding of the overall plan. she agrees with it. she will call with any problems that may develop before the next visit here.   Harriette Ohara, MD 09/29/17

## 2017-09-30 ENCOUNTER — Telehealth: Payer: Self-pay | Admitting: Hematology and Oncology

## 2017-09-30 NOTE — Telephone Encounter (Signed)
Per 5/8 no los °

## 2017-10-08 ENCOUNTER — Telehealth: Payer: Self-pay

## 2017-10-08 NOTE — Telephone Encounter (Signed)
Palliative Medicine RN Note: Rec'd a call from SW at [facility] requesting contact information, as the patient is not able to provide this. There is no contact information on file here.  Marjie Skiff [place], RN, BSN, [facility] Palliative Medicine Team 10/08/2017 2:25 PM Office [phone]

## 2017-10-14 ENCOUNTER — Emergency Department (HOSPITAL_COMMUNITY)
Admission: EM | Admit: 2017-10-14 | Discharge: 2017-10-14 | Disposition: A | Discharge status: Home / Self Care | Payer: Medicaid Other | Attending: Emergency Medicine | Admitting: Emergency Medicine

## 2017-10-14 ENCOUNTER — Encounter (HOSPITAL_COMMUNITY): Payer: Self-pay

## 2017-10-14 ENCOUNTER — Other Ambulatory Visit: Payer: Self-pay

## 2017-10-14 DIAGNOSIS — C50912 Malignant neoplasm of unspecified site of left female breast: Secondary | ICD-10-CM | POA: Diagnosis present

## 2017-10-14 DIAGNOSIS — Z79899 Other long term (current) drug therapy: Secondary | ICD-10-CM | POA: Insufficient documentation

## 2017-10-14 DIAGNOSIS — C7801 Secondary malignant neoplasm of right lung: Secondary | ICD-10-CM | POA: Insufficient documentation

## 2017-10-14 MED ORDER — HYDROMORPHONE HCL 1 MG/ML IJ SOLN
1.0000 mg | Freq: Once | INTRAMUSCULAR | Status: AC
  Administered 2017-10-14: 1 mg via INTRAMUSCULAR
  Filled 2017-10-14: qty 1

## 2017-10-14 MED ORDER — MORPHINE SULFATE (PF) 4 MG/ML IV SOLN
4.0000 mg | Freq: Once | INTRAVENOUS | Status: AC
  Administered 2017-10-14: 4 mg via INTRAMUSCULAR
  Filled 2017-10-14: qty 1

## 2017-10-14 NOTE — ED Notes (Signed)
Patient's old dressing to chest removed-saturated with blood-patient has a large necrotic fungating mass lateral left breast area that is oozing blood from the top of the mass-Dr. Gilford Raid at bedside and cauterized oozing area-combat gauze applied with 4x4 pressure dressings-vaseline gauze to rest of mass with 4x4"s applied-kerlix dressing and coban for pressure dressing applied. No further bleeding at this time-will observe.

## 2017-10-14 NOTE — ED Notes (Signed)
Bed: JI96 Expected date:  Expected time:  Means of arrival:  Comments: EMS from SNF-bleeding wound

## 2017-10-14 NOTE — ED Triage Notes (Signed)
Patient brought in by EMS from [facility] for bleeding from chronic open wound to left breast from cancer. Bleeding controlled with pressure dressing upon arrival.

## 2017-10-14 NOTE — ED Provider Notes (Signed)
[facility] DEPT Provider Note   CSN: [id number] Arrival date & time: 10/14/17  1958     History   Chief Complaint Chief Complaint  Patient presents with  . Wound Check    Old wound left breast bleeding    HPI Ann Oliver is a 62 y.o. female.  Pt presents to the ED today with bleeding from left breast.  The pt has a hx of a metastatic breast cancer initially diagnosed in 2017 for which she initially did not want treatment and was lost to follow up.  She was diagnosed with a fungating tumor to the left breast in 2018.  Oncology tried palliative chemo, but that was stopped due to fatigue.  The pt had significant social issues and was finally accepted to [facility] where she has access to food and nursing care to change her dressings.  She has had issues with the mass bleeding in the past.  Pt noticed it starting to bleed today pta.       Past Medical History:  Diagnosis Date  . Breast cancer (HCC)    METS TO LUNGS  . Sinusitis   . Tylenol overdose 05/2017    Patient Active Problem List   Diagnosis Date Noted  . Fever 08/24/2017  . MDD (major depressive disorder), recurrent episode, moderate ([place]) 06/03/2017  . Suicidal ideation 06/02/2017  . Tylenol overdose 06/01/2017  . Breast mass   . Acute blood loss anemia   . Hemorrhage from wound   . Ascites   . Evaluation by psychiatric service required 04/22/2017  . AKI (acute kidney injury) ([place]) 03/26/2017  . Leg swelling 03/26/2017  . Diarrhea 03/26/2017  . Leukocytosis 03/26/2017  . Breast wound 03/18/2017  . Blood loss anemia 01/18/2017  . Port catheter in place 12/02/2016  . Necrotizing soft tissue infection 11/21/2016  . Wound infection 10/28/2016  . Cellulitis of left breast 10/28/2016  . Encounter for palliative care   . Goals of care, counseling/discussion   . Breast cancer metastasized to lung, right ([place]) 09/25/2016  . Malnutrition of moderate degree 09/25/2016  .  Breast cancer of upper-outer quadrant of left female breast ([place]) 10/22/2015  . THYROID NODULE, RIGHT 05/31/2007  . Depressed 05/31/2007  . GERD 05/31/2007  . HOT FLASHES 05/31/2007  . HEADACHE 05/31/2007    Past Surgical History:  Procedure Laterality Date  . BIOPSY BREAST    . IR FLUORO GUIDE CV LINE RIGHT  11/06/2016  . IR US GUIDE VASC ACCESS RIGHT  11/06/2016     OB History   None      Home Medications    Prior to Admission medications   Medication Sig Start Date End Date Taking? Authorizing Provider  fentaNYL (DURAGESIC - DOSED MCG/HR) 25 MCG/HR patch Place 1 patch (25 mcg total) onto the skin every 3 (three) days. Patient taking differently: Place 50 mcg onto the skin every 3 (three) days.  09/29/17  Yes Nicholas Lose, MD  gabapentin (NEURONTIN) 600 MG tablet Take 1 tablet (600 mg total) by mouth 3 (three) times daily for 15 days. 08/26/17 10/14/17 Yes Bonnielee Haff, MD  loperamide (IMODIUM) 2 MG capsule Take 1 capsule (2 mg total) by mouth as needed for diarrhea or loose stools. 09/26/17  Yes Long, Wonda Olds, MD  mirtazapine (REMERON) 15 MG tablet Take 1 tablet (15 mg total) by mouth at bedtime. 09/26/17  Yes Long, Wonda Olds, MD  oxyCODONE-acetaminophen (PERCOCET/ROXICET) 5-325 MG tablet Take 1 tablet by mouth every 8 (  eight) hours as needed for severe pain. 09/26/17  Yes Long, Wonda Olds, MD  promethazine (PHENERGAN) 25 MG tablet Take 12.5 mg by mouth every 6 (six) hours as needed for nausea or vomiting.   Yes [provider]  senna (SENOKOT) 8.6 MG TABS tablet Take 1 tablet (8.6 mg total) by mouth at bedtime. 08/26/17  Yes Bonnielee Haff, MD  ondansetron (ZOFRAN) 8 MG tablet Take 1 tablet (8 mg total) by mouth every 8 (eight) hours as needed for nausea or vomiting. Patient not taking: Reported on 10/14/2017 09/26/17   Long, Wonda Olds, MD  polyethylene glycol [facility]) packet Take 17 g by mouth daily as needed for moderate constipation. Patient not taking: Reported on  09/12/2017 08/26/17   Bonnielee Haff, MD    Family History Family History  Family history unknown: Yes    Social History Social History   Tobacco Use  . Smoking status: Never Smoker  . Smokeless tobacco: Never Used  Substance Use Topics  . Alcohol use: No  . Drug use: No     Allergies   Aspirin   Review of Systems Review of Systems  Skin:       Bleeding from left breast mass  All other systems reviewed and are negative.    Physical Exam Updated Vital Signs BP 103/64 (BP Location: Right Arm)   Pulse (!) 106   Temp 98.1 F (36.7 C) (Oral)   Resp 18   Ht 5\' 1"  (1.549 m)   Wt 45.4 kg (100 lb)   SpO2 97%   BMI 18.89 kg/m   Physical Exam  Constitutional: She is oriented to person, place, and time. She appears well-developed and well-nourished.  HENT:  Head: Normocephalic and atraumatic.  Right Ear: External ear normal.  Left Ear: External ear normal.  Nose: Nose normal.  Mouth/Throat: Oropharynx is clear and moist.  Eyes: Pupils are equal, round, and reactive to light. Conjunctivae and EOM are normal.  Neck: Normal range of motion. Neck supple.  Cardiovascular: Normal rate, regular rhythm, normal heart sounds and intact distal pulses.  Pulmonary/Chest: Effort normal and breath sounds normal.  Abdominal: Soft. Bowel sounds are normal.  Musculoskeletal: Normal range of motion.  Neurological: She is alert and oriented to person, place, and time.  Skin: Skin is warm. Capillary refill takes less than 2 seconds.  Left breast fungating mass with multiple sites of bleeding.    Psychiatric: She has a normal mood and affect.  Nursing note and vitals reviewed.    ED Treatments / Results  Labs (all labs ordered are listed, but only abnormal results are displayed) Labs Reviewed - No data to display  EKG None  Radiology No results found.  Procedures Procedures (including critical care time)  Medications Ordered in ED Medications  HYDROmorphone (DILAUDID)  injection 1 mg (has no administration in time range)  morphine 4 MG/ML injection 4 mg (4 mg Intramuscular Given 10/14/17 2055)     Initial Impression / Assessment and Plan / ED Course  I have reviewed the triage vital signs and the nursing notes.  Pertinent labs & imaging results that were available during my care of the patient were reviewed by me and considered in my medical decision making (see chart for details).    Bleeding areas cauterized with bovie.  Combat gauze/vaseline gauze/gauze/kerlix/coban dressing applied.  No further bleeding.  Final Clinical Impressions(s) / ED Diagnoses   Final diagnoses:  Adenocarcinoma, breast, left [facility])    ED Discharge Orders  None       Isla Pence, MD 10/14/17 2135

## 2017-11-21 ENCOUNTER — Emergency Department (HOSPITAL_COMMUNITY)

## 2017-11-21 ENCOUNTER — Emergency Department (HOSPITAL_COMMUNITY)
Admission: EM | Admit: 2017-11-21 | Discharge: 2017-11-21 | Disposition: A | Discharge status: Home / Self Care | Attending: Emergency Medicine | Admitting: Emergency Medicine

## 2017-11-21 ENCOUNTER — Encounter (HOSPITAL_COMMUNITY): Payer: Self-pay

## 2017-11-21 MED ORDER — SODIUM CHLORIDE 0.9 % IV BOLUS (SEPSIS): 500.0000 mL | Freq: Once | INTRAVENOUS | Status: DC

## 2017-11-21 MED ORDER — SODIUM CHLORIDE 0.9 % IV BOLUS (SEPSIS)
1000.0000 mL | Freq: Once | INTRAVENOUS | Status: AC
  Administered 2017-11-21: 1000 mL via INTRAVENOUS

## 2017-11-21 MED ORDER — PIPERACILLIN-TAZOBACTAM 3.375 G IVPB 30 MIN: 3.3750 g | Freq: Once | INTRAVENOUS | Status: DC

## 2017-11-21 MED ORDER — VANCOMYCIN HCL IN DEXTROSE 1-5 GM/200ML-% IV SOLN: 1000.0000 mg | Freq: Once | INTRAVENOUS | Status: DC

## 2017-11-21 NOTE — ED Notes (Signed)
Person was not a patient in the ED, nursing home sent paperwork for two patients, this person was never present.

## 2017-11-21 NOTE — ED Notes (Signed)
[facility] unsuccessful blood draw.

## 2017-11-22 DEATH — deceased

## 2019-03-18 IMAGING — US US ABDOMEN COMPLETE
1 series · 13 of 25 positions shown · non-contrast
Comparison: Prior study at set the [HOSPITAL] dated
04/14/2007

CLINICAL DATA: History of breast carcinoma. Previous demonstration
of 2 cm hyperechoic nodule in the right lobe of the liver felt to
represent hemangioma.

EXAM:
ABDOMEN ULTRASOUND COMPLETE

[Series 1: us abdomen complete · 0.17mm/px · 13 of 109 slices shown]
[im 1/109]
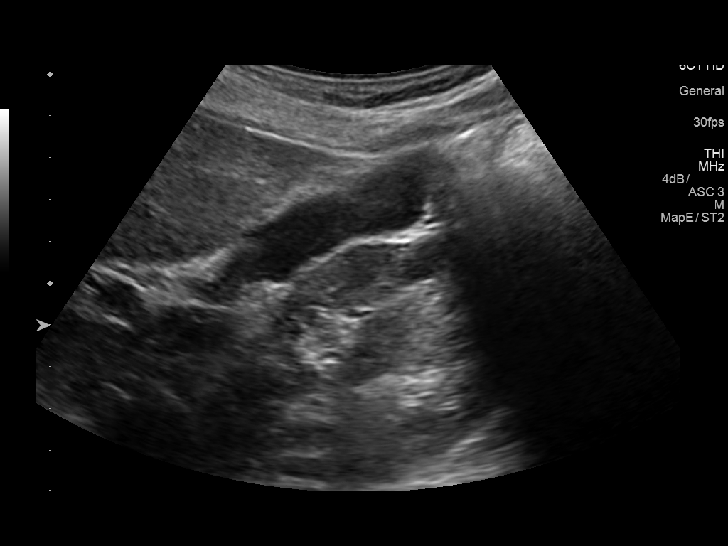
[im 10/109]
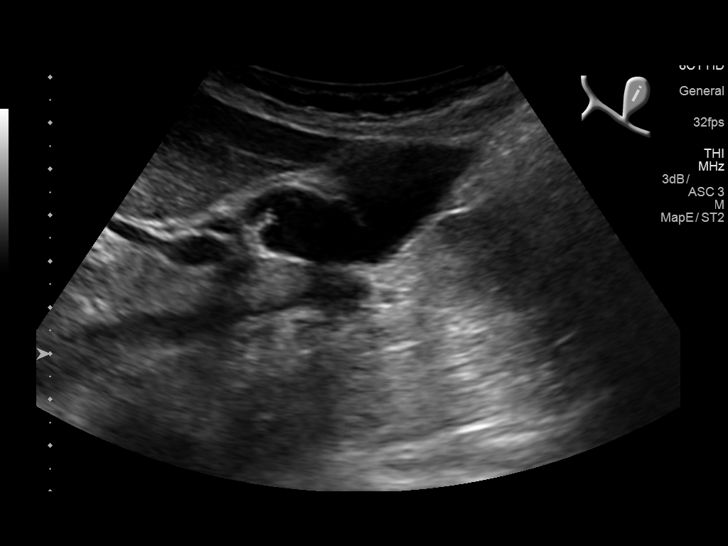
[im 19/109]
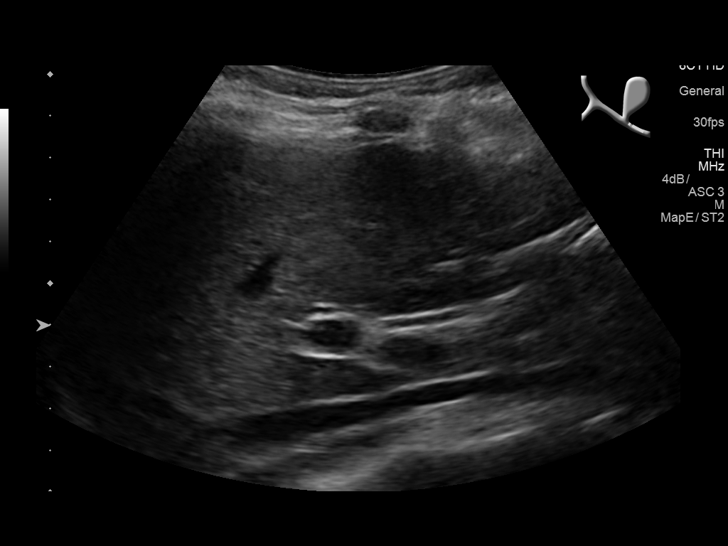
[im 28/109]
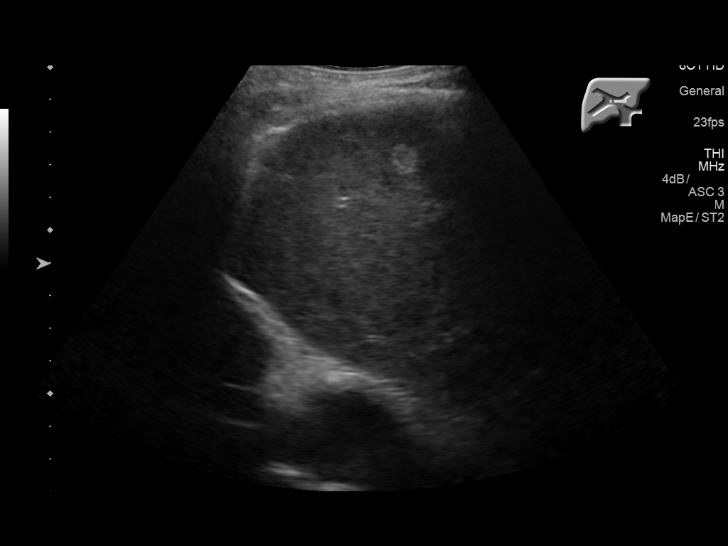
[im 37/109]
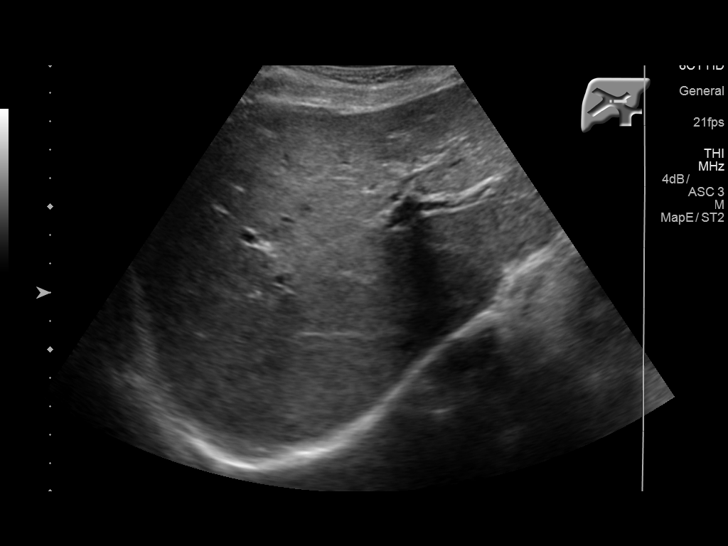
[im 46/109]
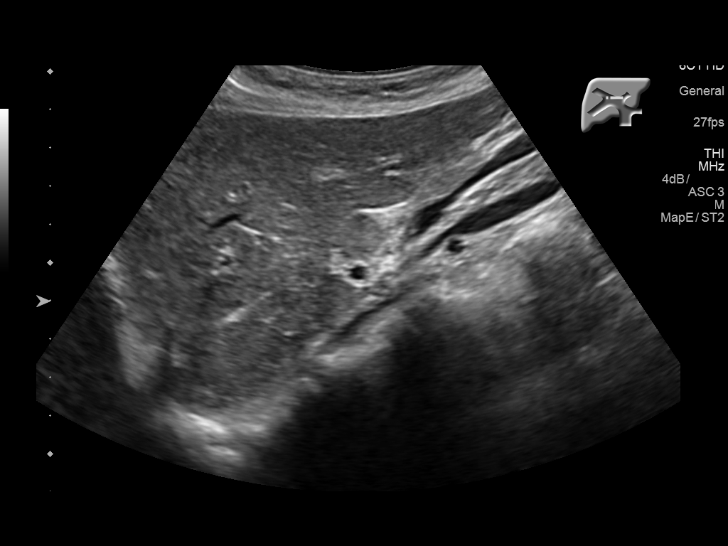
[im 55/109]
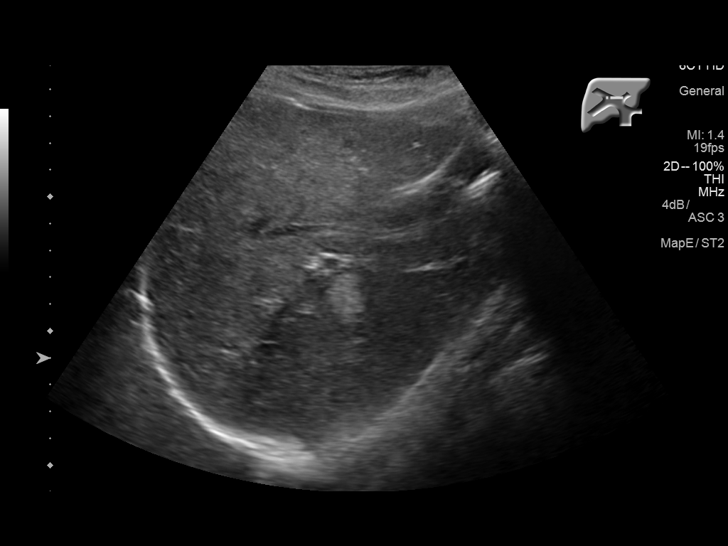
[im 64/109]
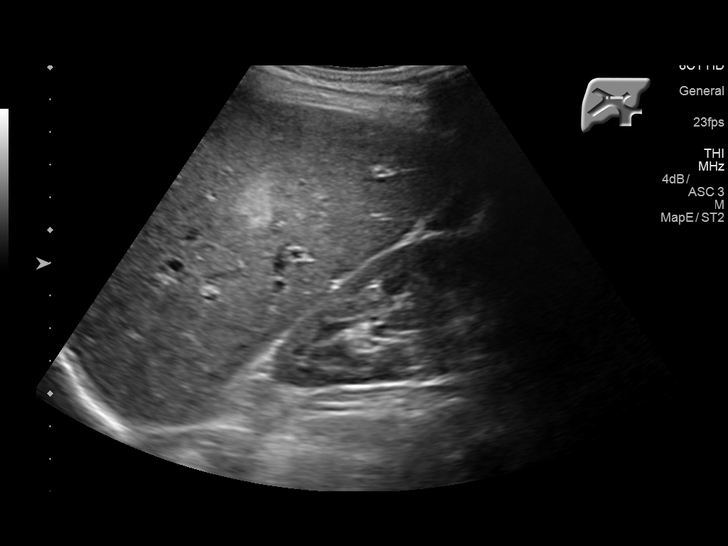
[im 73/109]
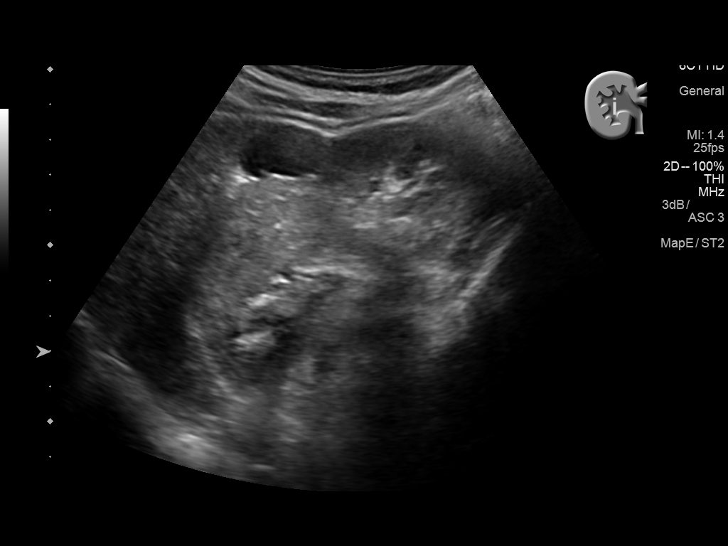
[im 82/109]
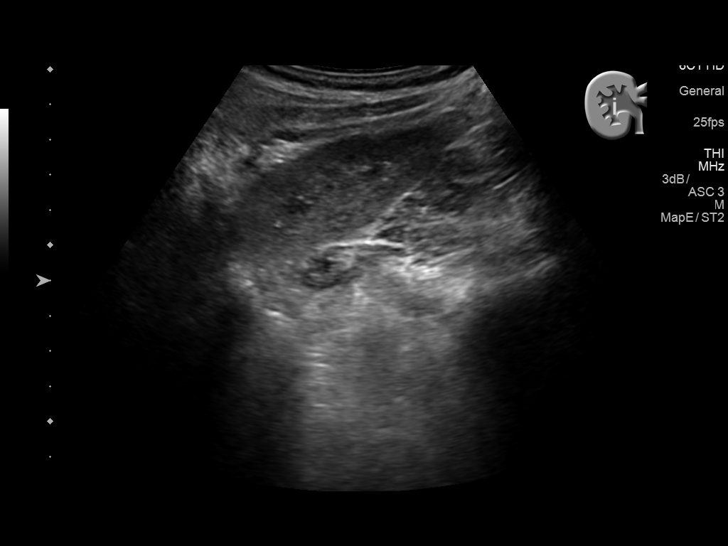
[im 91/109]
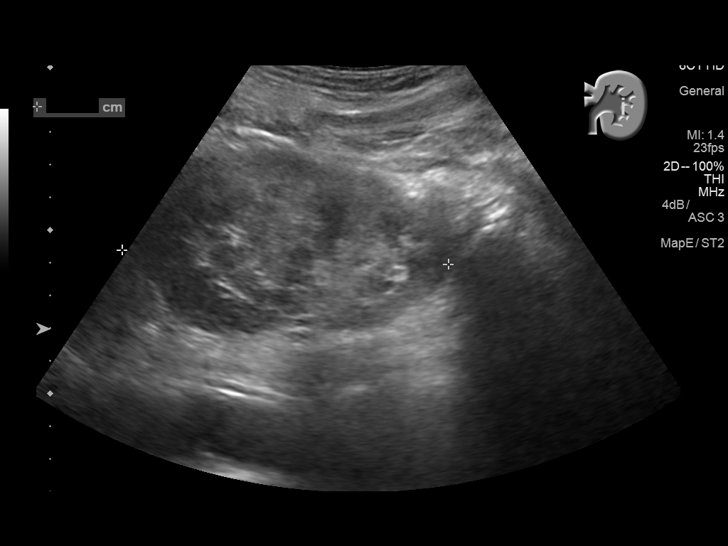
[im 100/109]
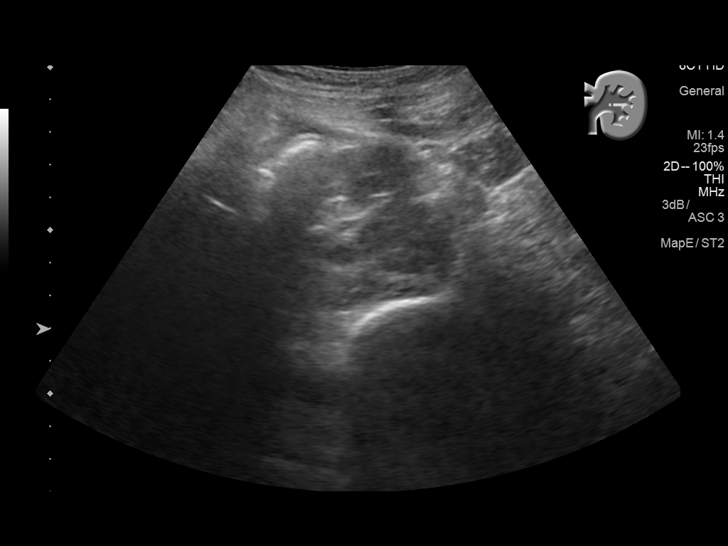
[im 109/109]
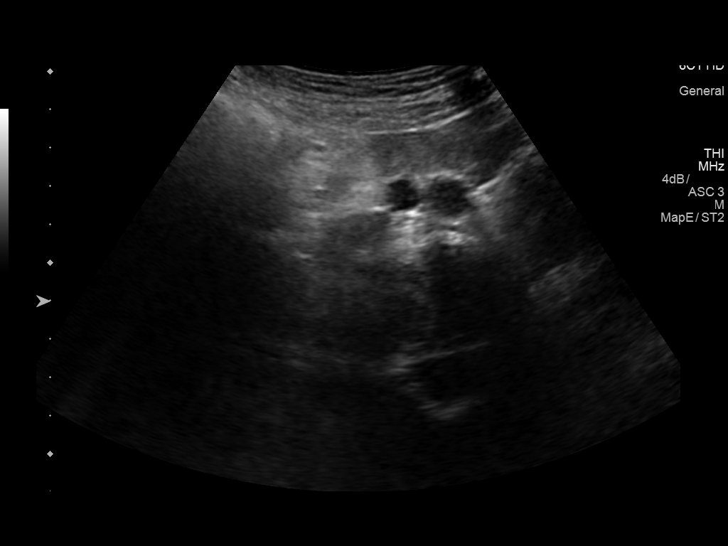

[13 of 25 positions shown; findings below may reference images not displayed]

FINDINGS: Gallbladder: No gallstones or wall thickening visualized. No
sonographic Murphy sign noted by sonographer.

Common bile duct: Diameter: 2 mm.

Liver: Similar appearing oval echogenic lesion within the right lobe
hepatic parenchyma measures approximately 2.0 x 1.3 x 2.1 cm and
most likely represents a hemangioma. Smaller and similar 1 cm
hyperechoic lesion in the right lobe also likely represents
hemangioma.

IVC: No abnormality visualized.

Pancreas: Visualized portion unremarkable.

Spleen: Size and appearance within normal limits.

Right Kidney: Length: 9.5. Echogenicity within normal limits. No
hydronephrosis visualized. 1.9 cm benign cyst.

Left Kidney: Length: 10.0. Echogenicity within normal limits. No
mass or hydronephrosis visualized.

Abdominal aorta: No aneurysm visualized.

Other findings: None.
IMPRESSION: Stable echogenic right hepatic nodule measuring 2.1 cm likely
represents meningioma. A second 1 cm hyperechoic lesion in the right
lobe also likely represents hemangioma.

## 2019-10-01 IMAGING — DX DG CHEST 1V PORT
1 series · 1 of 1 positions shown · non-contrast
Comparison: Chest radiograph March 08, 2017 and CT chest October 28, 2016

CLINICAL DATA: LEFT chest pain.

EXAM:
PORTABLE CHEST 1 VIEW

[chest ap]
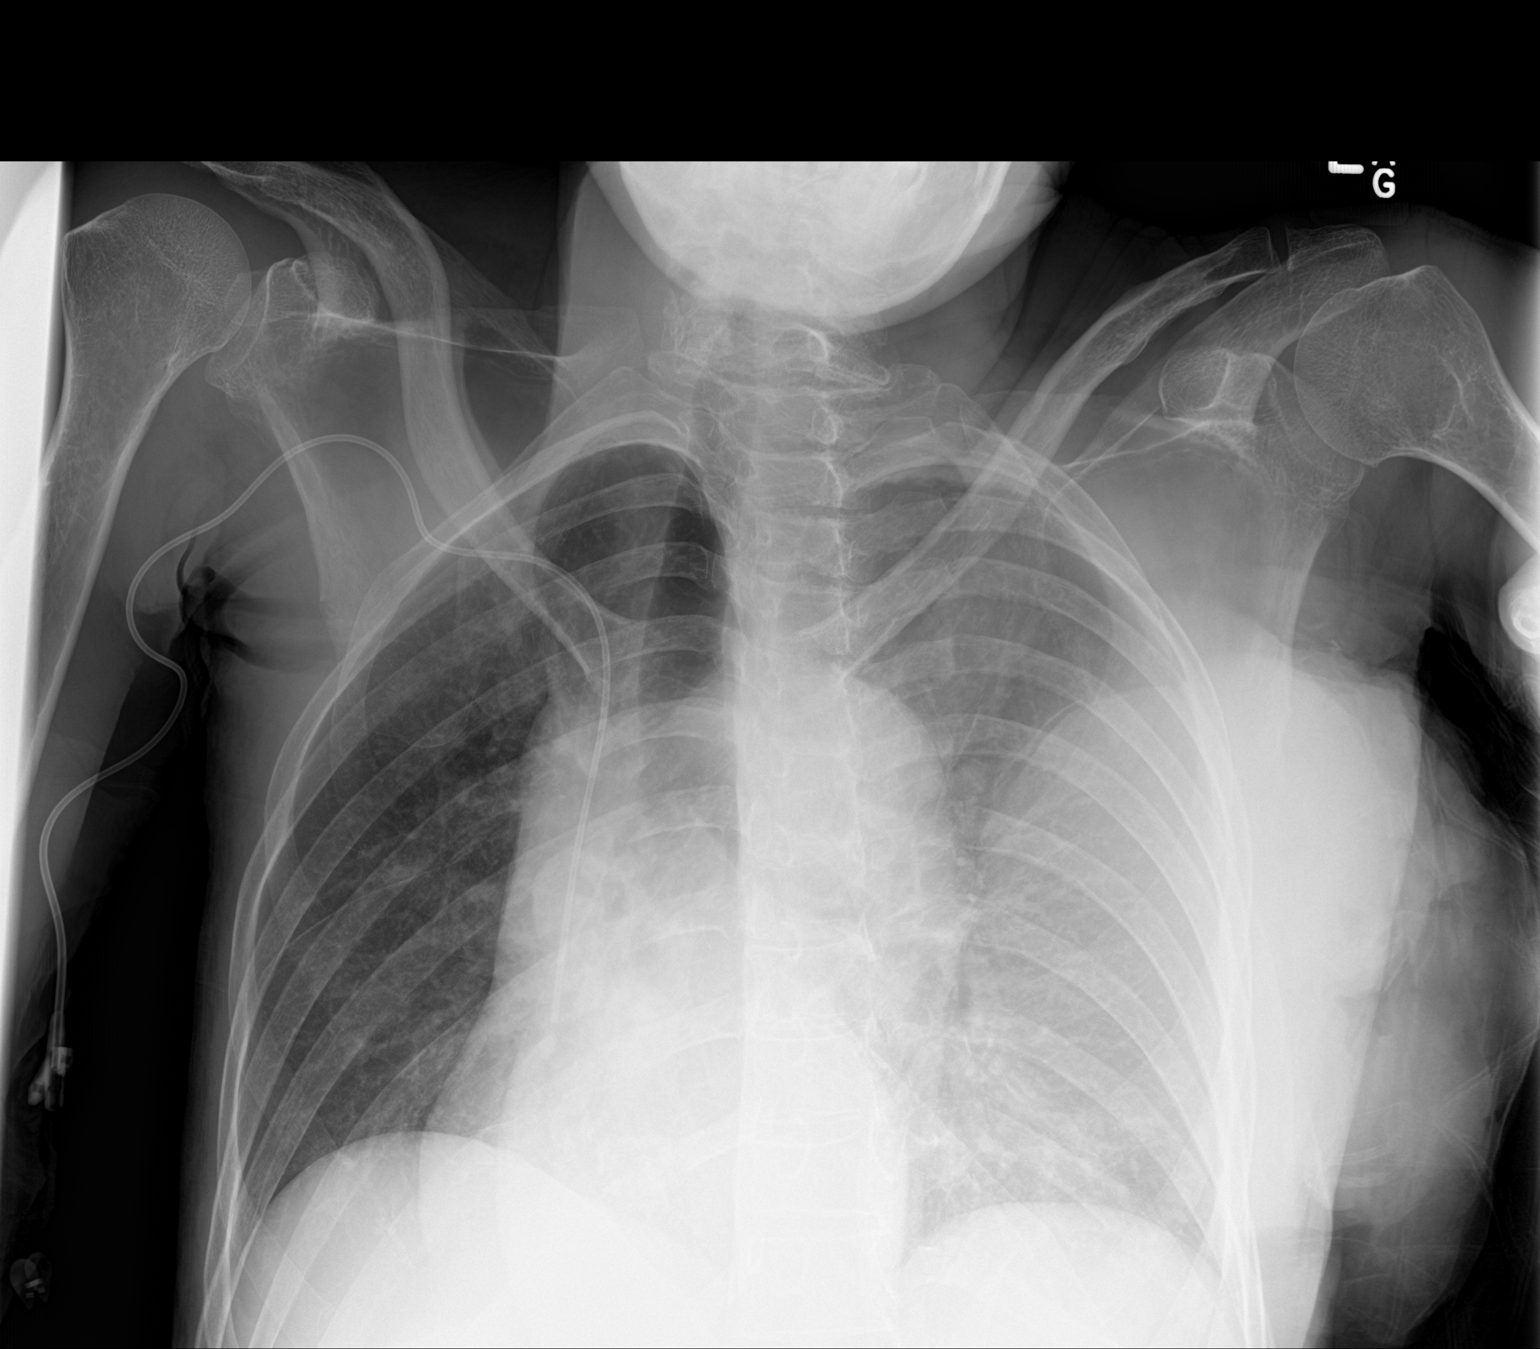

[1 of 1 positions shown; findings below may reference images not displayed]

FINDINGS: Limited assessment LEFT lung due to overlying dense LEFT breast
mass. Patient rotated to the RIGHT. Cardiomediastinal silhouette is
normal. Known pulmonary metastasis better characterized on prior CT.
No pleural effusion or focal consolidation. LEFT apical pleural
thickening. No pneumothorax. RIGHT PICC distal tip projects the
cavoatrial junction. Osseous structures are unchanged.
IMPRESSION: No acute cardiopulmonary process.

Large LEFT chest wall mass.

Stable RIGHT PICC distal tip at cavoatrial junction.

## 2019-11-03 IMAGING — CT CT ABD-PELV W/ CM
2 of 5 series · 13 of 36 positions shown, 16 images · IV contrast (ISOVUE 300)
Comparison: Plain film 03/18/2017.  10/28/2016 CT.

CLINICAL DATA: Breast cancer. Restaging. Non osseous recurrent
suspected.

EXAM:
CT CHEST, ABDOMEN, AND PELVIS WITH CONTRAST
TECHNIQUE: Multidetector CT imaging of the chest, abdomen and pelvis was
performed following the standard protocol during bolus
administration of intravenous contrast.
CONTRAST:  100mL HAP4LN-266 IOPAMIDOL (HAP4LN-266) INJECTION 61%,
15mL HAP4LN-266 IOPAMIDOL (HAP4LN-266) INJECTION 61%

[Series 2: cap with · axial · 0.79mm/px · z∈[-548,-68]mm · 10 of 118 slices shown, 13 images]
[im 11/118  mediastinal]
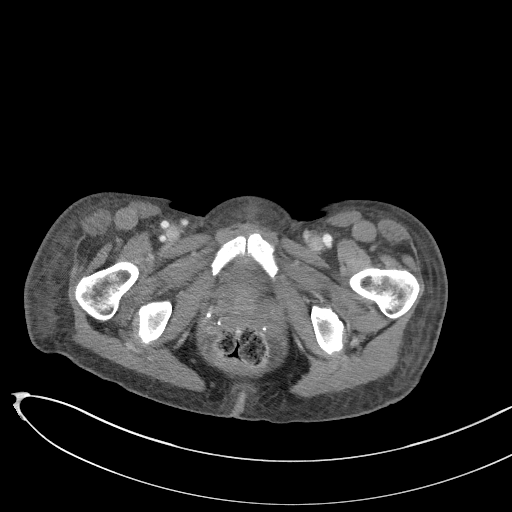
[im 11/118  lung]
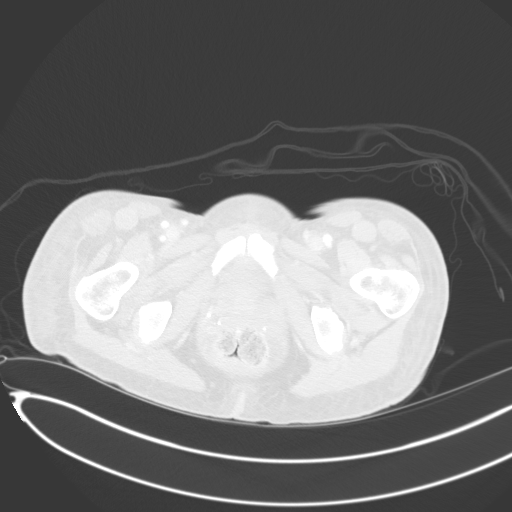
[im 22/118  lung]
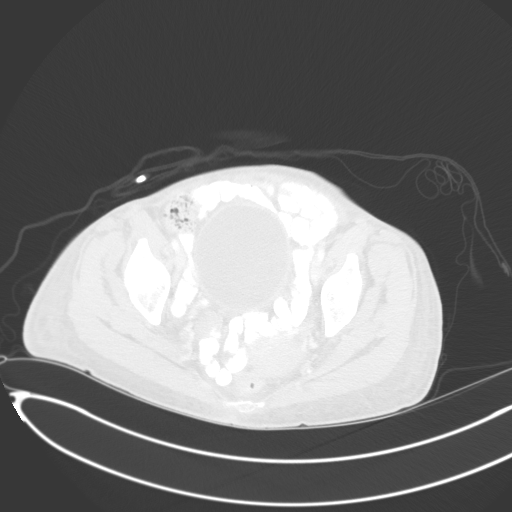
[im 32/118  lung]
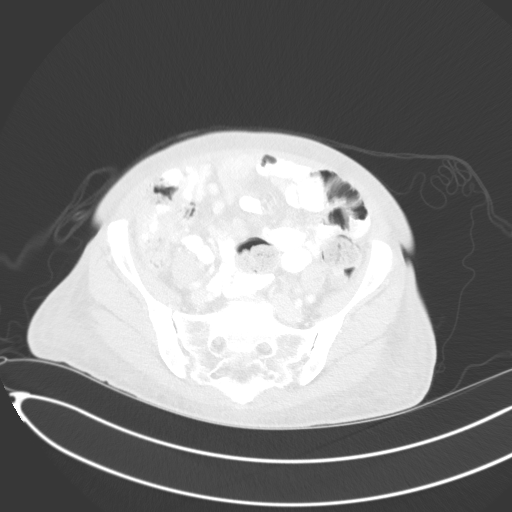
[im 43/118  lung]
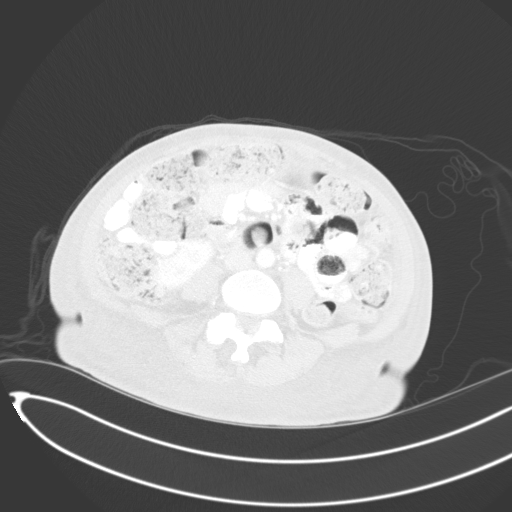
[im 54/118  mediastinal]
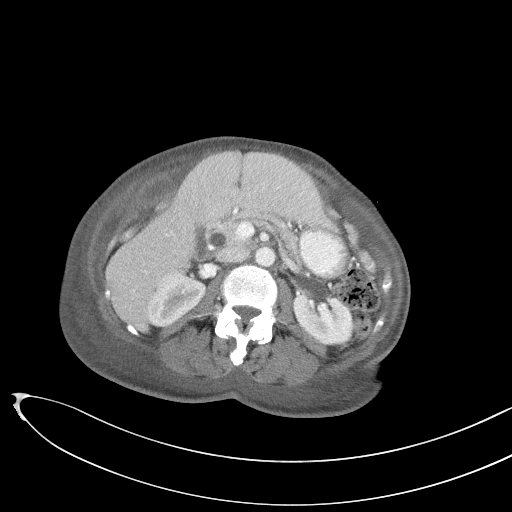
[im 54/118  lung]
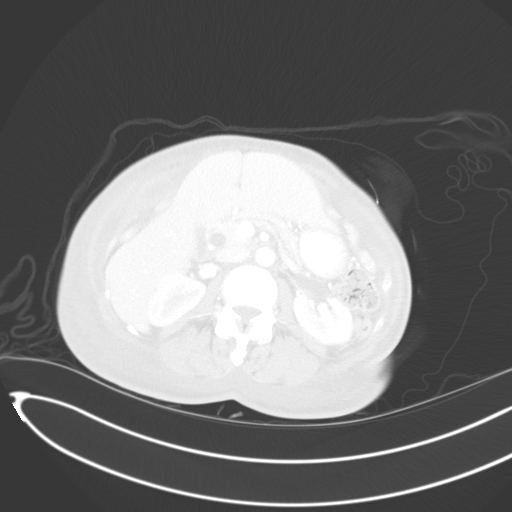
[im 64/118  lung]
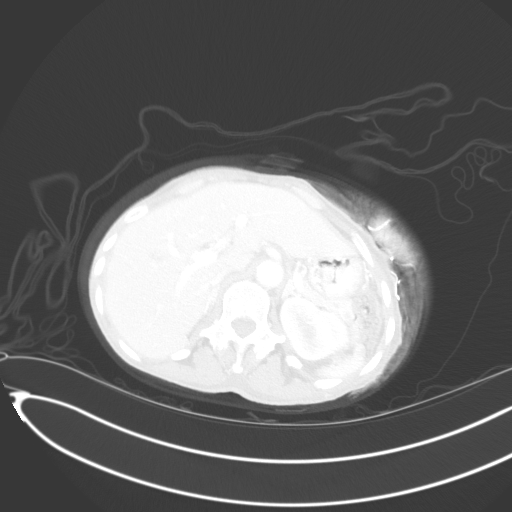
[im 75/118  lung]
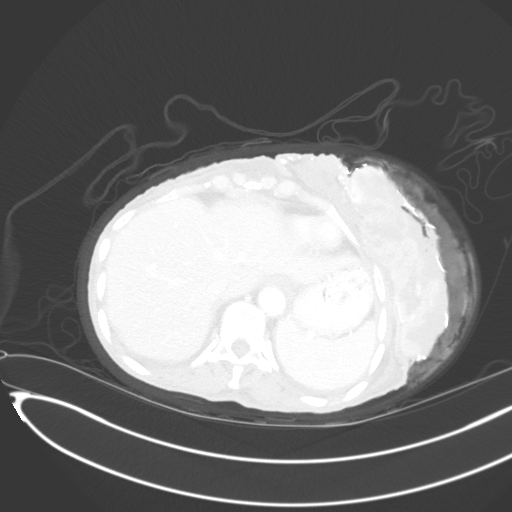
[im 86/118  lung]
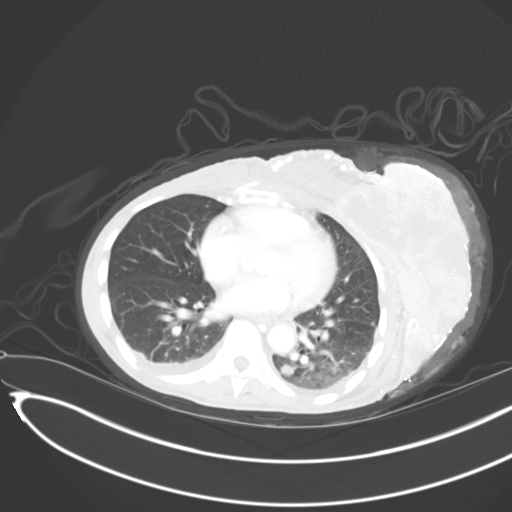
[im 96/118  mediastinal]
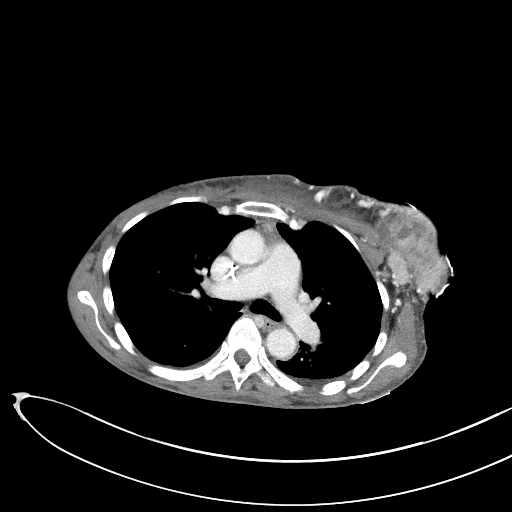
[im 96/118  lung]
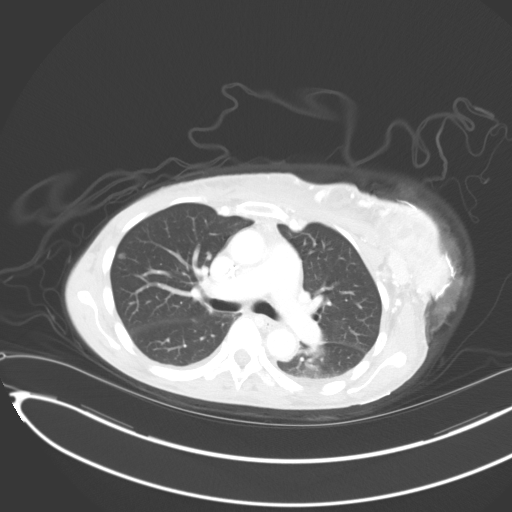
[im 107/118  lung]
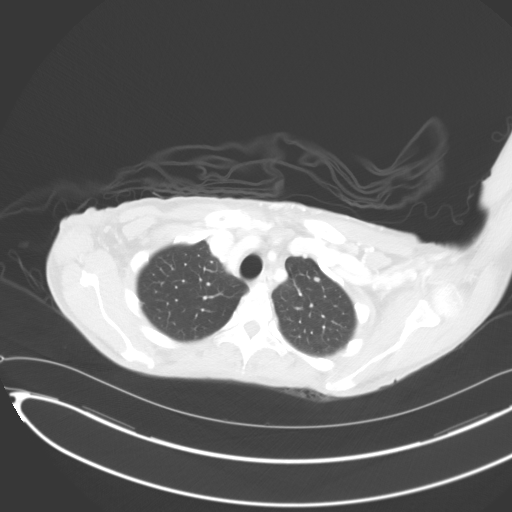

[Series 5: coronals · coronal · 0.82mm/px · 3 of 120 slices shown]
[im 24/120  lung]
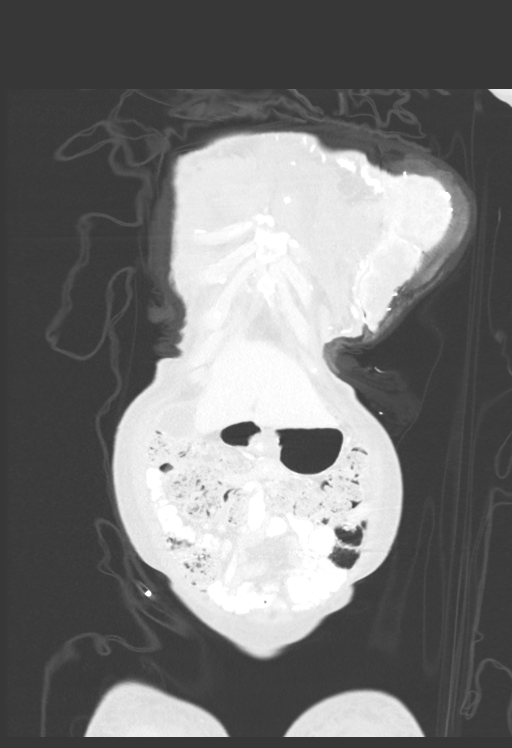
[im 48/120  lung]
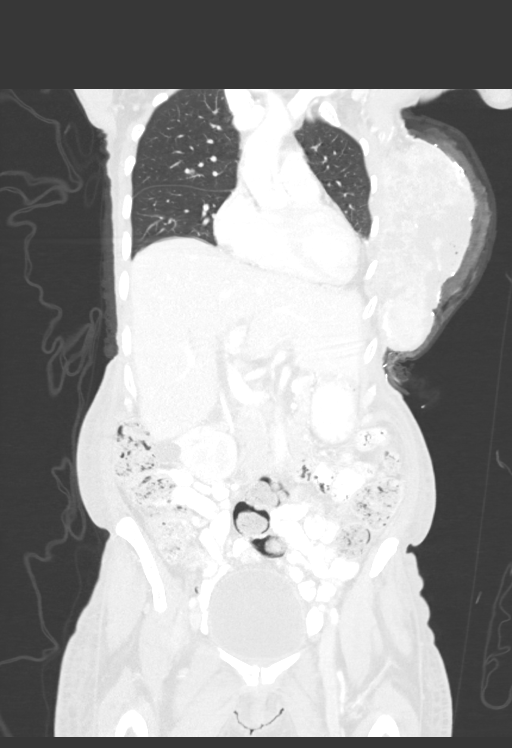
[im 72/120  lung]
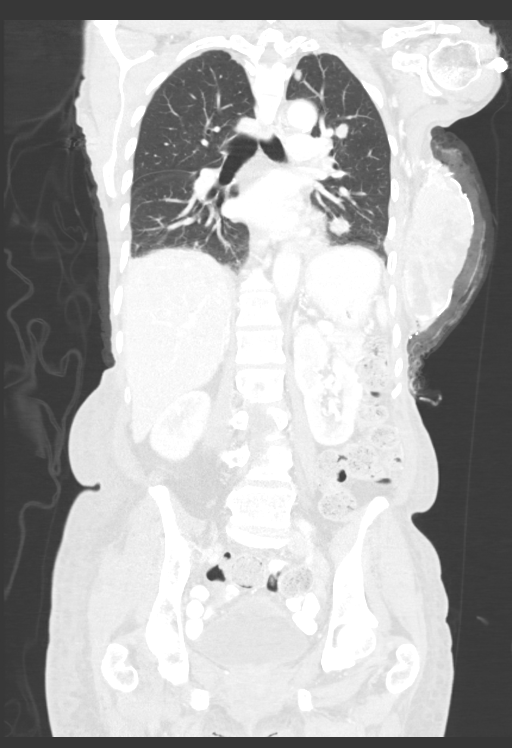

[13 of 36 positions shown; findings below may reference images not displayed]

FINDINGS: CT CHEST FINDINGS

Cardiovascular: Aortic atherosclerosis. Mild cardiomegaly, without
pericardial effusion. No central pulmonary embolism, on this
non-dedicated study. Right-sided PICC line which terminates at the
high right atrium.

Mediastinum/Nodes: Left supraclavicular node of 9 mm on image
8/series 2 is similar. Well-circumscribed hypoattenuating right
thyroid nodule is grossly similar and of doubtful clinical
significance. left axillary nodes are decreased, with an index
measuring 1.0 cm today versus 2.1 cm on the prior exam (when
remeasured).

A left subpectoral node measures 11 mm today versus 9 mm on the
prior exam (when remeasured). No mediastinal or hilar adenopathy.

Lungs/Pleura: Trace bilateral pleural fluid, new. Bilateral
pulmonary nodules consistent with metastasis. Index left upper lobe
pulmonary nodule measures 1.4 cm on image 46/ series 4 versus 1.1 cm
on the prior.

An index right lower lobe pulmonary nodule measures 9 mm on image
88/series 4 versus 8 mm on the prior exam (when remeasured).

Inferior lingular nodule measures 1.8 cm on image 84/ series 4 and
is similar 1.9 cm on the prior.

Subpleural left upper lobe 1.3 cm nodule on image 67/series 4 is
new.

Musculoskeletal: Left chest wall mass measures 15.2 x 6.2 by 17.2 cm
today versus 11.4 x 8.3 cm by 14.7 on the prior exam

(when remeasured). A mild compression deformity involving the
superior endplate of T10 is not significantly changed.

CT ABDOMEN PELVIS FINDINGS

Hepatobiliary: Segment 2 hypoattenuating lesion measures 11 mm on
image 46/ series 2 versus 16 mm on the prior. No new liver lesion.
Normal gallbladder. Upper normal common duct size, 7 mm. No
obstructive cause identified.

Pancreas: Normal, without mass or ductal dilatation.

Spleen: Normal in size, without focal abnormality.

Adrenals/Urinary Tract: Normal adrenal glands. 2.1 cm right renal
cyst. Normal left kidney. No hydronephrosis. Normal urinary bladder.

Stomach/Bowel: Normal stomach, without wall thickening. Colonic
stool burden suggests constipation. Normal small bowel.

Vascular/Lymphatic: Normal caliber of the aorta and branch vessels.
Necrotic porta hepatis node of 1.9 x 2.5 cm on image 60/series 2.
Similar to minimally enlarged from 1.8 x 2.2 on the prior exam. No
pelvic sidewall adenopathy.

Reproductive: Suspect a small uterine fundal fibroid on image
102/series 2. No adnexal mass.

Other: Mild pelvic floor laxity. No significant free fluid.
Anasarca.

Musculoskeletal: Probable degenerative sclerosis about the left
sacroiliac joint, similar.
IMPRESSION: 1. Mixed response, with overall disease progression.
2. Enlargement of left chest wall mass with improvement in left
axillary but slight increase in left subpectoral adenopathy. Similar
left supraclavicular adenopathy.
3. Progression of pulmonary metastasis.
4. Improvement in isolated hepatic metastasis with enlargement of
necrotic periportal adenopathy.
5. Small bilateral pleural effusions.
6.  Possible constipation.

## 2020-11-22 ENCOUNTER — Encounter: Payer: Self-pay | Admitting: Hematology and Oncology

## 2023-02-17 ENCOUNTER — Encounter: Payer: Self-pay | Admitting: Hematology and Oncology
